# Patient Record
Sex: Female | Born: 1955 | Race: Black or African American | Hispanic: No | Marital: Married | Smoking: Never smoker
Health system: Southern US, Community
[De-identification: ages and names within clinical notes are randomized; demographics above are authoritative.]

## PROBLEM LIST (undated history)

## (undated) ENCOUNTER — Ambulatory Visit (INDEPENDENT_AMBULATORY_CARE_PROVIDER_SITE_OTHER): Admission: RE | Payer: Self-pay

## (undated) DIAGNOSIS — I10 Essential (primary) hypertension: Secondary | ICD-10-CM

## (undated) DIAGNOSIS — D332 Benign neoplasm of brain, unspecified: Secondary | ICD-10-CM

## (undated) DIAGNOSIS — E7800 Pure hypercholesterolemia, unspecified: Secondary | ICD-10-CM

## (undated) HISTORY — PX: OTHER SURGICAL HISTORY: SHX169

## (undated) HISTORY — PX: EYE SURGERY: SHX253

## (undated) HISTORY — PX: BREAST CYST EXCISION: SHX579

## (undated) HISTORY — PX: EXCHANGE, CORNEAL IMPLANT: SHX3930

## (undated) HISTORY — PX: TUBAL LIGATION: SHX77

## (undated) HISTORY — PX: BREAST BIOPSY: SHX20

## (undated) HISTORY — PX: BREAST EXCISIONAL BIOPSY: SUR124

## (undated) HISTORY — PX: BRAIN SURGERY: SHX531

---

## 1995-03-17 ENCOUNTER — Ambulatory Visit: Admit: 1995-03-17 | Disposition: A | Discharge status: Home / Self Care | Payer: Self-pay | Admitting: Family Medicine

## 1995-08-16 ENCOUNTER — Ambulatory Visit (HOSPITAL_BASED_OUTPATIENT_CLINIC_OR_DEPARTMENT_OTHER): Admission: RE | Admit: 1995-08-16 | Payer: Self-pay | Source: Ambulatory Visit

## 1997-09-28 ENCOUNTER — Emergency Department
Admit: 1997-09-28 | Disposition: A | Discharge status: Home / Self Care | Payer: Self-pay | Admitting: Emergency Medicine

## 1998-03-17 ENCOUNTER — Ambulatory Visit: Admit: 1998-03-17 | Disposition: A | Discharge status: Home / Self Care | Payer: Self-pay | Admitting: Internal Medicine

## 2001-01-22 ENCOUNTER — Emergency Department: Admit: 2001-01-22 | Payer: Self-pay | Source: Emergency Department | Admitting: Emergency Medicine

## 2002-06-24 ENCOUNTER — Ambulatory Visit: Admit: 2002-06-24 | Disposition: A | Discharge status: Home / Self Care | Payer: Self-pay | Source: Ambulatory Visit

## 2002-09-13 ENCOUNTER — Emergency Department: Admit: 2002-09-13 | Payer: Self-pay | Source: Emergency Department

## 2002-09-23 ENCOUNTER — Ambulatory Visit: Admit: 2002-09-23 | Disposition: A | Discharge status: Home / Self Care | Payer: Self-pay | Source: Ambulatory Visit

## 2002-10-27 ENCOUNTER — Emergency Department: Admit: 2002-10-27 | Payer: Self-pay | Source: Emergency Department | Admitting: Emergency Medicine

## 2003-06-07 ENCOUNTER — Emergency Department: Admit: 2003-06-07 | Payer: Self-pay | Source: Emergency Department | Admitting: Emergency Medicine

## 2005-05-03 ENCOUNTER — Ambulatory Visit
Admission: AD | Admit: 2005-05-03 | Disposition: A | Discharge status: Home / Self Care | Payer: Self-pay | Source: Ambulatory Visit | Admitting: Primary Podiatric Medicine

## 2006-04-24 HISTORY — PX: BREAST BIOPSY: SHX20

## 2007-02-20 ENCOUNTER — Ambulatory Visit
Admission: RE | Admit: 2007-02-20 | Disposition: A | Discharge status: Home / Self Care | Payer: Self-pay | Source: Ambulatory Visit

## 2007-04-25 HISTORY — PX: BRAIN SURGERY: SHX531

## 2011-04-25 HISTORY — PX: CORNEAL TRANSPLANT: SHX108

## 2012-04-10 ENCOUNTER — Emergency Department
Admission: EM | Admit: 2012-04-10 | Discharge: 2012-04-10 | Disposition: A | Discharge status: Home / Self Care | Payer: Self-pay | Attending: Emergency Medicine | Admitting: Emergency Medicine

## 2012-04-10 ENCOUNTER — Emergency Department: Payer: Self-pay

## 2012-04-10 DIAGNOSIS — N390 Urinary tract infection, site not specified: Secondary | ICD-10-CM | POA: Insufficient documentation

## 2012-04-10 DIAGNOSIS — Z880 Allergy status to penicillin: Secondary | ICD-10-CM | POA: Insufficient documentation

## 2012-04-10 DIAGNOSIS — I10 Essential (primary) hypertension: Secondary | ICD-10-CM | POA: Insufficient documentation

## 2012-04-10 DIAGNOSIS — E7800 Pure hypercholesterolemia, unspecified: Secondary | ICD-10-CM | POA: Insufficient documentation

## 2012-04-10 HISTORY — DX: Pure hypercholesterolemia, unspecified: E78.00

## 2012-04-10 HISTORY — DX: Essential (primary) hypertension: I10

## 2012-04-10 LAB — URINALYSIS WITH MICROSCOPIC
Bilirubin, UA: NEGATIVE
Glucose, UA: NEGATIVE
Ketones UA: NEGATIVE
Nitrite, UA: NEGATIVE
Protein, UR: NEGATIVE
Specific Gravity UA: 1.006 (ref 1.001–1.035)
Urine pH: 7 (ref 5.0–8.0)
Urobilinogen, UA: NORMAL mg/dL

## 2012-04-10 MED ORDER — NITROFURANTOIN MONOHYD MACRO 100 MG PO CAPS
100.00 mg | ORAL_CAPSULE | Freq: Once | ORAL | Status: AC
Start: 2012-04-10 — End: 2012-04-10
  Administered 2012-04-10: 100 mg via ORAL
  Filled 2012-04-10: qty 1

## 2012-04-10 MED ORDER — NITROFURANTOIN MONOHYD MACRO 100 MG PO CAPS
100.00 mg | ORAL_CAPSULE | Freq: Two times a day (BID) | ORAL | Status: AC
Start: 2012-04-10 — End: 2012-04-17

## 2012-04-10 MED ORDER — PHENAZOPYRIDINE HCL 200 MG PO TABS
200.00 mg | ORAL_TABLET | Freq: Once | ORAL | Status: AC
Start: 2012-04-10 — End: 2012-04-10
  Administered 2012-04-10: 200 mg via ORAL
  Filled 2012-04-10: qty 1

## 2012-04-10 NOTE — ED Notes (Signed)
E-sign not working, pt signed paper copy of D/C instructions.

## 2012-04-10 NOTE — Discharge Instructions (Signed)
Urinary Tract Infection    You have been diagnosed with a lower urinary tract infection (UTI). This is also called cystitis.    Cystitis is an infection in your bladder. Your doctor diagnosed it by testing your urine. Cystitis usually causes burning with urination or frequent urination. It might make you feel like you have to urinate even when you don't.     Cystitis is usually treated with antibiotics and medicine to help with pain.    It is VERY IMPORTANT that you fill your prescription and take all of the antibiotics as directed. If a lower urinary tract infection goes untreated for too long, it can become a kidney infection.    FOR WOMEN: To reduce the risk of getting cystitis again:   Always urinate before and after sexual intercourse.   Always wipe from front to back after urinating or having a bowel movement. Do not wipe from back to front.   Drink plenty of fluids. Try to drink cranberry or blueberry juice. These juices have a chemical that stops bacteria from "sticking" to the bladder.    YOU SHOULD SEEK MEDICAL ATTENTION IMMEDIATELY, EITHER HERE OR AT THE NEAREST EMERGENCY DEPARTMENT, IF ANY OF THE FOLLOWING OCCURS:   You have a fever or shaking chills.   You feel nauseated or vomit.   You have pain in your side or back.   You don't get better after taking all of your antibiotics.   You have any new symptoms or concerns.   You feel worse or do not improve.    Hypertension    You have been diagnosed with elevated blood pressure.    The medical term for high blood pressure is hypertension. Many people feel anxious or uncomfortable about being at the hospital. If you feel anxious today, this could make your blood pressure appear high, even if your blood pressure is usually normal. Check your blood pressure several more times when you are not feeling stress. Keep a record of these readings and give this information to your regular doctor. He or she will decide whether you have hypertension  that requires medical treatment.    If your blood pressure becomes extremely high all of a sudden, you will probably notice symptoms. In fact, very high blood pressure is a medical emergency. Most people with hypertension have blood pressure that is only a little too high. Mild high blood pressure does not cause specific symptoms. Instead, the effects of hypertension develop slowly over time. Untreated hypertension can affect the heart, brain, kidneys, eyes, and blood vessels. Unfortunately, by the time side-effects become noticeable, the body has already been damaged. This is why hypertension is called "the silent killer!"    It is important to follow up with your regular doctor. Check your blood pressure several times in the next 1 to 2 weeks and tell your doctor about the results. It may be helpful to keep a log or a journal where you can write down your blood pressures. Note the time of day and the activity you were doing when the reading was taken.    YOU SHOULD SEEK MEDICAL ATTENTION IMMEDIATELY, EITHER HERE OR AT THE NEAREST EMERGENCY DEPARTMENT, IF ANY OF THE FOLLOWING OCCURS:   You have a headache.   You have chest pain.   You are short of breath or have trouble breathing.    You feel weak, especially on only one side of the body.   Your symptoms get worse or you have other concerns.  concerns.

## 2012-04-10 NOTE — ED Notes (Signed)
Woke up this evening with severe left lower back pain and urinary urgency. +frequency, denies burning or other complaints. +N

## 2012-04-10 NOTE — ED Provider Notes (Signed)
Physician/Midlevel provider first contact with patient: 04/10/12 0324         EMERGENCY DEPARTMENT HISTORY AND PHYSICAL EXAM    Date Time: 04/10/2012 4:37 AM  Patient Name: Wendy [name]  Attending MD Etheleen Mayhew               History of Presenting Illness     Chief Complaint:   Chief Complaint   Patient presents with   . Flank Pain       Wendy [name] is a 56 y.o. female, with PMHx: HTN, high cholesterol, who presents with left sided lower back pain onset this morning just PTA. A/w urinary frequency, and mild nausea. Patient states symptoms are similar to previous UTI. No vomiting, fever, or other GI or GU complaints at this time. No other symptoms or complaints.   This history was obtained from the patient. They worsen with movement and improve with rest.     Past Medical History     Past Medical History   Diagnosis Date   . Hypertensive disorder    . Hypercholesteremia        Past Surgical History     Past Surgical History   Procedure Date   . Brain surgery 2009   . Corneal transplant        Family History     No family history on file.    Social History     History     Social History   . Marital Status: Legally Separated     Spouse Name: N/A     Number of Children: N/A   . Years of Education: N/A     Social History Main Topics   . Smoking status: Never Smoker    . Smokeless tobacco: Not on file   . Alcohol Use: No   . Drug Use: No   . Sexually Active: Not on file     Other Topics Concern   . Not on file     Social History Narrative   . No narrative on file       Allergies     Allergies   Allergen Reactions   . Iodine    . Penicillins        Medications     Current facility-administered medications:[COMPLETED] nitrofurantoin (macrocrystal-monohydrate) (MACROBID) capsule 100 mg, 100 mg, Oral, Once, [name], Mont Dutton, MD, 100 mg at 04/10/12 0420;  phenazopyridine (PYRIDIUM) tablet 200 mg, 200 mg, Oral, Once, [name], Mont Dutton, MD  Current outpatient prescriptions:lisinopril-hydrochlorothiazide  (PRINZIDE,ZESTORETIC) 10-12.5 MG per tablet, Take 1 tablet by mouth daily., Disp: , Rfl: ;  rosuvastatin (CRESTOR) 20 MG tablet, Take 20 mg by mouth daily., Disp: , Rfl: ;  nitrofurantoin, macrocrystal-monohydrate, (MACROBID) 100 MG capsule, Take 1 capsule (100 mg total) by mouth 2 (two) times daily., Disp: 10 capsule, Rfl: 0    Review of Systems     Positive: left lower back pain, urinary frequency, nausea.     All Other Systems Reviewed and Negative: Yes    Physical Exam     Constitutional: Vital signs reviewed. Well appearing.  Head: Normocephalic, atraumatic  Eyes: No conjunctival injection. No discharge.    Neck: Normal range of motion.    Abdomen: Soft and min suprapub tender to palp. No guarding. No masses or hepatosplenomegaly.  Back no cva ttp  UpperExtremity:no edema, cyanosis, FROM  LowerExtremity: No edema. No cyanosis. FROM  Neurological: No focal motor deficits by observation. Speech normal. Memory normal.  Skin: Warm and dry. No  rash.  Lymphatic:  Psychiatric: Normal affect. Normal concentration.      Diagnostic Study Results     Labs -     Results     Procedure Component Value Units Date/Time    LAB-UA with Micro [[id number]]  (Abnormal) Collected:04/10/12 0325    Specimen Information:Urine Updated:04/10/12 0414     Urine Type Clean Catch      Color, UA Yellow      Clarity, UA Clear      Specific Gravity UA 1.006      Urine pH 7.0      Leukocytes, UA Moderate (A)      Nitrite, UA Negative      Protein, UA Negative      Glucose, UA Negative      Ketones UA Negative      Urobilinogen, UA Normal mg/dL      Bilirubin, UA Negative      Blood, UA Moderate (A)      RBC, UA 26 - 50 (A) /HPF      WBC, UA 26 - 50 (A) /HPF      Squamous Epithelial Cells, Urine 0 - 5 /HPF      Urine Bacteria Rare /HPF           Radiologic Studies -   Radiology Results (24 Hour)     ** No Results found for the last 24 hours. **      .    Clinical Course in the Emergency Department            Medical Decision Making     I reviewed  the vital signs, nursing notes, past medical history, past surgical history, family history and social history.    Vital Signs -   Patient Vitals for the past 12 hrs:   BP Temp Pulse Resp   04/10/12 0408 162/74 mmHg - 71  16    04/10/12 0314 198/98 mmHg 97.5 F (36.4 C) 83  16        Pulse Oximetry Analysis - nl without need for supplemental oxygen      Labs:I have reviewed the labs at the time of visit. Dr Valere Dross      Differential Diagnosis (not completely inclusive): uti, pyelo,s tone    cw uti, nontoxic, stable for [place] with po meds            Final diagnoses:   UTI (lower urinary tract infection)     New Prescriptions    NITROFURANTOIN, MACROCRYSTAL-MONOHYDRATE, (MACROBID) 100 MG CAPSULE    Take 1 capsule (100 mg total) by mouth 2 (two) times daily.     ED Disposition     Discharge Wendy [name] discharge to home/self care.    Condition at discharge: Stable            _______________________________    Attestations:    I was acting as a scribe for Carmon Sails, MD on HENDERSON,Reatha R  Treatment Team: Scribe: Azalia Bilis   I am the first provider for this patient and I personally performed the services documented. Treatment Team: Scribe: Charlyn Minerva E is scribing for me on HENDERSON,Jennel R. This note accurately reflects work and decisions made by me.  Carmon Sails, MD  _______________________________              Carmon Sails, MD  04/11/12 [phone]

## 2012-04-29 ENCOUNTER — Ambulatory Visit (INDEPENDENT_AMBULATORY_CARE_PROVIDER_SITE_OTHER): Payer: Self-pay

## 2012-05-20 ENCOUNTER — Emergency Department
Admission: EM | Admit: 2012-05-20 | Discharge: 2012-05-20 | Disposition: A | Discharge status: Home / Self Care | Payer: Self-pay

## 2012-05-20 ENCOUNTER — Emergency Department: Payer: Self-pay

## 2012-05-20 DIAGNOSIS — E7800 Pure hypercholesterolemia, unspecified: Secondary | ICD-10-CM | POA: Insufficient documentation

## 2012-05-20 DIAGNOSIS — S8000XA Contusion of unspecified knee, initial encounter: Secondary | ICD-10-CM | POA: Insufficient documentation

## 2012-05-20 DIAGNOSIS — I10 Essential (primary) hypertension: Secondary | ICD-10-CM | POA: Insufficient documentation

## 2012-05-20 DIAGNOSIS — S139XXA Sprain of joints and ligaments of unspecified parts of neck, initial encounter: Secondary | ICD-10-CM | POA: Insufficient documentation

## 2012-05-20 DIAGNOSIS — Z880 Allergy status to penicillin: Secondary | ICD-10-CM | POA: Insufficient documentation

## 2012-05-20 MED ORDER — CYCLOBENZAPRINE HCL 10 MG PO TABS
10.0000 mg | ORAL_TABLET | Freq: Three times a day (TID) | ORAL | Status: AC | PRN
Start: 2012-05-20 — End: 2012-06-04

## 2012-05-20 MED ORDER — HYDROCODONE-ACETAMINOPHEN 5-325 MG PO TABS
1.0000 | ORAL_TABLET | Freq: Four times a day (QID) | ORAL | Status: AC | PRN
Start: 2012-05-20 — End: 2012-05-30

## 2012-05-20 MED ORDER — HYDROCODONE-ACETAMINOPHEN 5-325 MG PO TABS
1.0000 | ORAL_TABLET | Freq: Once | ORAL | Status: DC
Start: 2012-05-20 — End: 2012-05-20
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
## 2012-05-20 NOTE — ED Provider Notes (Signed)
Physician/Midlevel provider first contact with patient: 05/20/12 1741         History     Chief Complaint   Patient presents with   . Shoulder Pain   . Neck Pain     HPI    Past Medical History   Diagnosis Date   . Hypertensive disorder    . Hypercholesteremia        Past Surgical History   Procedure Date   . Brain surgery 2009   . Corneal transplant        History reviewed. No pertinent family history.    Social  History   Substance Use Topics   . Smoking status: Never Smoker    . Smokeless tobacco: Not on file   . Alcohol Use: No       .     Allergies   Allergen Reactions   . Iodine    . Penicillins        Current/Home Medications    LISINOPRIL-HYDROCHLOROTHIAZIDE (PRINZIDE,ZESTORETIC) 10-12.5 MG PER TABLET    Take 1 tablet by mouth daily.    ROSUVASTATIN (CRESTOR) 20 MG TABLET    Take 20 mg by mouth daily.        Review of Systems    Physical Exam    BP 156/94  Pulse 87  Temp 98.2 F (36.8 C) (Tympanic)  Ht 1.626 m  Wt 77.111 kg  BMI 29.17 kg/m2  SpO2 98%    Physical Exam   Nursing note and vitals reviewed.  Constitutional: She is oriented to person, place, and time. She appears well-developed and well-nourished.   HENT:   Right Ear: External ear normal.   Left Ear: External ear normal.   Eyes: EOM are normal. Pupils are equal, round, and reactive to light.   Neck: Normal range of motion.        Lt lat para cervical tenderness and pain on rom   Musculoskeletal: She exhibits tenderness. She exhibits no edema.        Lt sided neck tenderness/ also sl tender lt knee/ full rom   Neurological: She is alert and oriented to person, place, and time. She displays normal reflexes. No cranial nerve deficit. Coordination normal.   Skin: Skin is warm and dry.       MDM and ED Course     ED Medication Orders     None           MDM      Procedures    Clinical Impression & Disposition     Clinical Impression  Final diagnoses:   None        ED Disposition     None           New Prescriptions    No medications on file         Treatment Team: Scribe: Keane Police, MD  05/20/12 [phone]

## 2012-12-03 ENCOUNTER — Other Ambulatory Visit: Payer: Self-pay | Admitting: Family Medicine

## 2012-12-06 ENCOUNTER — Ambulatory Visit
Admission: RE | Admit: 2012-12-06 | Discharge: 2012-12-06 | Disposition: A | Discharge status: Home / Self Care | Payer: No Typology Code available for payment source | Source: Ambulatory Visit | Attending: Family Medicine | Admitting: Family Medicine

## 2012-12-06 DIAGNOSIS — Z0000 Encounter for general adult medical examination without abnormal findings: Secondary | ICD-10-CM | POA: Insufficient documentation

## 2013-01-01 ENCOUNTER — Other Ambulatory Visit: Payer: Self-pay | Admitting: Family

## 2013-01-09 ENCOUNTER — Ambulatory Visit: Payer: Charity

## 2013-01-11 ENCOUNTER — Ambulatory Visit
Admission: RE | Admit: 2013-01-11 | Discharge: 2013-01-11 | Disposition: A | Discharge status: Home / Self Care | Payer: No Typology Code available for payment source | Source: Ambulatory Visit | Attending: Family | Admitting: Family

## 2013-01-11 DIAGNOSIS — D320 Benign neoplasm of cerebral meninges: Secondary | ICD-10-CM | POA: Insufficient documentation

## 2013-01-11 DIAGNOSIS — I6789 Other cerebrovascular disease: Secondary | ICD-10-CM | POA: Insufficient documentation

## 2013-01-27 ENCOUNTER — Ambulatory Visit: Payer: No Typology Code available for payment source | Attending: Ophthalmology | Admitting: Ophthalmology

## 2013-01-27 NOTE — Progress Notes (Signed)
Agree with assessment and plan as above.

## 2013-01-27 NOTE — Progress Notes (Signed)
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 DSEK OS  -appears clear without signs of rejection  -recommend taper durezol to daily OS instead of BID   -recommend aggressive preservative free artificial tears 1-2 hours OS     2. Fuchs OD  -patient would like to hold off on any surgery at this point   -Follow up in 4 months, if no symptomatic improvement, can consider muro     3. Dry eye syndrome  -aggressive lubrication OU per #1  -use of goggles to prevent further drying  -ointment qhs OU     4. Pseudophakia OS   -stable, well-centered    5. Nuclear sclerosis OD  -not visually significant   -continue to monitor

## 2013-02-10 ENCOUNTER — Telehealth: Payer: Self-pay

## 2013-02-10 NOTE — Telephone Encounter (Signed)
Pt called to note irritation OS still had not improved since starting ung per note. Pt was encouraged to continue with ung and durezol QD OS per notes. Pt also encouraged to use preservative free artificial tears q1-2hrs (per Drs Patel/Ali notes). Pt notes she has only be using BID and will be increase application schedule. Pt encouraged to adhere strictly and to call back with any symptoms of pain or increased irritation or changes in vision. Pt scheduled for f/u Feb 2015, stated understanding that she can call back for an earlier appointment if needed.

## 2013-02-18 ENCOUNTER — Telehealth: Payer: Self-pay | Admitting: Ophthalmology

## 2013-02-18 NOTE — Telephone Encounter (Signed)
Unable to reach patient. This was a call back, patient had called back regarding ongoing dry eye complaint. Will try to call back soon, left message.

## 2013-02-19 ENCOUNTER — Telehealth: Payer: Self-pay | Admitting: Ophthalmology

## 2013-02-19 NOTE — Telephone Encounter (Signed)
No answer again, left voicemail for patient to call back and given time for [place] to call. Told to go to ER/walk in to clinic if have concern for infection/worsening vision. Patient has follow-up with [place] within 4 months.

## 2013-02-26 ENCOUNTER — Ambulatory Visit: Payer: No Typology Code available for payment source | Attending: Ophthalmology | Admitting: Ophthalmology

## 2013-02-26 NOTE — Progress Notes (Signed)
Agree with assessment and plan as above.

## 2013-02-26 NOTE — Progress Notes (Signed)
1. DSEK OS  -appears clear without signs of rejection  -continue durezol qday OS    -continue aggressive preservative free artificial tears 1-2 hours OS, ointment qhs or bid    2. Fuchs OD  -again patient would like to hold off on any surgery at this point    -continue muro qid od  -Follow up in 3 months    3. Dry eye syndrome OU  -aggressive lubrication OU per #1  -will treat for blepharitis as well given RUL swelling (which has been present since surgery and stable per pt) - handout given, WC bid with lid hygeine    4. Pseudophakia OS    -stable, well-centered    5. Nuclear sclerosis OD  -not visually significant    -continue to monitor     6. Refractive error ou - pt wants new glasses, but would hold off until symptoms of (3) have improvement    Follow up - 3 m or sooner with any sign/sx of graft rejection as discussed with the patient.

## 2013-04-03 ENCOUNTER — Emergency Department: Payer: No Typology Code available for payment source

## 2013-04-03 ENCOUNTER — Emergency Department
Admission: EM | Admit: 2013-04-03 | Discharge: 2013-04-03 | Disposition: A | Discharge status: Home / Self Care | Payer: No Typology Code available for payment source | Attending: Emergency Medicine | Admitting: Emergency Medicine

## 2013-04-03 DIAGNOSIS — R079 Chest pain, unspecified: Secondary | ICD-10-CM | POA: Insufficient documentation

## 2013-04-03 DIAGNOSIS — E7800 Pure hypercholesterolemia, unspecified: Secondary | ICD-10-CM | POA: Insufficient documentation

## 2013-04-03 DIAGNOSIS — I10 Essential (primary) hypertension: Secondary | ICD-10-CM | POA: Insufficient documentation

## 2013-04-03 LAB — CBC AND DIFFERENTIAL
Basophils Absolute Automated: 0.02 (ref 0.00–0.20)
Basophils Automated: 0 %
Eosinophils Absolute Automated: 0.1 (ref 0.00–0.70)
Eosinophils Automated: 2 %
Hematocrit: 40.4 % (ref 37.0–47.0)
Hgb: 13 g/dL (ref 12.0–16.0)
Immature Granulocytes Absolute: 0.01
Immature Granulocytes: 0 %
Lymphocytes Absolute Automated: 1.15 (ref 0.50–4.40)
Lymphocytes Automated: 17 %
MCH: 29 pg (ref 28.0–32.0)
MCHC: 32.2 g/dL (ref 32.0–36.0)
MCV: 90 fL (ref 80.0–100.0)
MPV: 10.7 fL (ref 9.4–12.3)
Monocytes Absolute Automated: 0.8 (ref 0.00–1.20)
Monocytes: 12 %
Neutrophils Absolute: 4.77 (ref 1.80–8.10)
Neutrophils: 70 %
Nucleated RBC: 0 (ref 0–1)
Platelets: 396 (ref 140–400)
RBC: 4.49 (ref 4.20–5.40)
RDW: 14 % (ref 12–15)
WBC: 6.85 (ref 3.50–10.80)

## 2013-04-03 LAB — ECG 12-LEAD
Atrial Rate: 76 {beats}/min
P Axis: 72 degrees
P-R Interval: 156 ms
Q-T Interval: 404 ms
QRS Duration: 86 ms
QTC Calculation (Bezet): 454 ms
R Axis: 20 degrees
T Axis: 49 degrees
Ventricular Rate: 76 {beats}/min

## 2013-04-03 LAB — GFR: EGFR: >60

## 2013-04-03 LAB — URINALYSIS WITH MICROSCOPIC
Bilirubin, UA: NEGATIVE
Blood, UA: NEGATIVE
Glucose, UA: NEGATIVE
Ketones UA: NEGATIVE
Leukocyte Esterase, UA: NEGATIVE
Nitrite, UA: NEGATIVE
Protein, UR: NEGATIVE
Specific Gravity UA: 1.01 (ref 1.001–1.035)
Urine pH: 7.5 (ref 5.0–8.0)
Urobilinogen, UA: NORMAL mg/dL

## 2013-04-03 LAB — CK: Creatine Kinase (CK): 270 U/L — ABNORMAL HIGH (ref 29–168)

## 2013-04-03 LAB — BASIC METABOLIC PANEL
BUN: 12 mg/dL (ref 7.0–19.0)
CO2: 25 mEq/L (ref 22–29)
Calcium: 10 mg/dL (ref 8.5–10.5)
Chloride: 102 mEq/L (ref 98–107)
Creatinine: 0.7 mg/dL (ref 0.6–1.0)
Glucose: 90 mg/dL (ref 70–100)
Potassium: 4.1 mEq/L (ref 3.5–5.1)
Sodium: 138 mEq/L (ref 136–145)

## 2013-04-03 LAB — POCT PREGNANCY TEST, URINE HCG: POCT Pregnancy HCG Test, UR: NEGATIVE

## 2013-04-03 LAB — I-STAT TROPONIN: i-STAT Troponin: 0 ng/mL (ref 0.00–0.09)

## 2013-04-03 LAB — CKMB: Creatinine Kinase MB (CKMB): 2.4 ng/mL (ref 0.0–4.9)

## 2013-04-03 MED ORDER — IBUPROFEN 400 MG PO TABS
800.0000 mg | ORAL_TABLET | Freq: Once | ORAL | Status: AC
Start: 2013-04-03 — End: 2013-04-03
  Administered 2013-04-03: 800 mg via ORAL
  Filled 2013-04-03: qty 2

## 2013-04-03 MED ORDER — IBUPROFEN 800 MG PO TABS
800.0000 mg | ORAL_TABLET | Freq: Three times a day (TID) | ORAL | 0 refills | Status: DC | PRN
Start: 2013-04-03 — End: 2014-09-15
  Filled 2013-04-03: qty 30, 10d supply, fill #0

## 2013-04-03 NOTE — ED Provider Notes (Signed)
Attending Note:     The patient was seen and examined by the resident and myself. I agree with the plan as it was presented to me.   I assessed this patient at 8:03 AM. I reviewed the vital signs, nursing notes, past medical history, past surgical history, family history and social history. I am the first attending provider for this patient.   - Loris Seelye Josem Kaufmann, MD    Hx: 57 y.o. female with a PMH of HLD, HTN, and prediabetes p/w left sided, sharp chest pain that is worse with lying down, and taking a deep breath.  The pain woke her from sleep around 0200, resolved, and then at 0600, the pain returned.  States that she has never had this pain before.  Admits to lifting weights yesterday but does not remember any specific injury.  The pain occasionally radiates to the left shoulder.  No associated nausea, no vomiting, no cough, no shortness of breath.  The pain is still present at time of evaluation.  Last stress test was 12-18 months ago - negative.    No significant family cardiac history.    PE:  Constitutional: Vital signs reviewed.   Head: Normocephalic, atraumatic  Eyes: No conjunctival injection. No discharge.  ENT: Mucous membranes moist  Neck: Normal range of motion. Non-tender.  Respiratory/Chest: Clear to auscultation. No respiratory distress.   Cardiovascular: Regular rate and rhythm. No murmur.   Abdomen: Soft and non-tender. No guarding. No masses or hepatosplenomegaly.  Back: Nrml appearance, no flank ttp, no midline ttp or masses.   LowerExtremity: No edema. No cyanosis.  Neurological: No focal motor deficits by observation. Speech normal. Memory normal.  Msk: Nrml ROM, non-ttp  Skin: Warm and dry. No rash.  Lymphatic: No cervical lymphadenopathy.  Psychiatric: Normal affect. Normal concentration.    Labs:     Results     Procedure Component Value Units Date/Time    CK-MB [[id number]] Collected:04/03/13 0846     Creatinine Kinase MB (CKMB) 2.4 ng/mL Updated:04/03/13 1152    LAB-UA with Micro [[id number]]  Collected:04/03/13 0847    Specimen Information:Urine Updated:04/03/13 1146     Urine Type Clean Catch      Color, UA Yellow      Clarity, UA Clear      Specific Gravity UA 1.010      Urine pH 7.5      Leukocyte Esterase, UA Negative      Nitrite, UA Negative      Protein, UR Negative      Glucose, UA Negative      Ketones UA Negative      Urobilinogen, UA Normal mg/dL      Bilirubin, UA Negative      Blood, UA Negative      RBC, UA 0 - 5      WBC, UA 0 - 5      Squamous Epithelial Cells, Urine 0 - 5     GFR [[id number]] Collected:04/03/13 0846     EGFR >60.0 Updated:04/03/13 1115    CK with MB if Indicated [[id number]]  (Abnormal) Collected:04/03/13 0846    Specimen Information:Blood Updated:04/03/13 1115     Creatine Kinase (CK) 270 (H) U/L     Basic Metabolic Panel (BMP) [[id number]] Collected:04/03/13 0846    Specimen Information:Blood Updated:04/03/13 1115     Glucose 90 mg/dL      BUN 13.2 mg/dL      Creatinine 0.7 mg/dL      CALCIUM 44.0 mg/dL  Sodium 138 mEq/L      Potassium 4.1 mEq/L      Chloride 102 mEq/L      CO2 25 mEq/L     CBC with Differential [[id number]] Collected:04/03/13 0846    Specimen Information:Blood / Blood Updated:04/03/13 1056     WBC 6.85      RBC 4.49      Hgb 13.0 g/dL      Hematocrit 78.2 %      MCV 90.0 fL      MCH 29.0 pg      MCHC 32.2 g/dL      RDW 14 %      Platelets 396      MPV 10.7 fL      Neutrophils 70 %      Lymphocytes Automated 17 %      Monocytes 12 %      Eosinophils Automated 2 %      Basophils Automated 0 %      Immature Granulocyte 0 %      Nucleated RBC 0      Neutrophils Absolute 4.77      Abs Lymph Automated 1.15      Abs Mono Automated 0.80      Abs Eos Automated 0.10      Absolute Baso Automated 0.02      Absolute Immature Granulocyte 0.01     POCT Pregnancy Test, Urine HCG [[id number]] Collected:04/03/13 0905     POCT QC Pass Updated:04/03/13 0907     POCT Pregnancy HCG Test, UR Negative      Comment:        Result:     Negative Value is Normal in Healthy Males or  Healthy non-pregnant Females    i-Stat Troponin [[id number]] Collected:04/03/13 0852     i-STAT Troponin 0.00 ng/mL Updated:04/03/13 0904          Rads:     XR CHEST 2 VIEWS    Final Result:      1. No acute cardiopulmonary process.        Launa Flight, MD     04/03/2013 8:40 AM       Medical Decision Making:   I reviewed the vital signs, nursing notes, past medical history, past surgical history, family history and social history.  Vital Signs - No data found.    Clinical course and plan:   Atypical chest pain with unremarkable EKG. No pain at this time. Labs pending, will re-eval.    Re-evaluation: 11:52A: Based on the patient's clinical presentation, vital signs, and diagnostic studies, I feel the patient is safe for discharge. I considered (in part and not exclusively): Acute Coronary Syndrome, Pulmonary Embolism, Pnumonia, Pneumothorax, Aortic Dissection, Aortic Aneurysm and Abdominal etiologies. The patient/family understands their instructions and I am comfortable that the patient will be able to follow up as an outpatient in a timely fashion.    Clinical impression:   1. Chest pain      Disposition:   ED Disposition     Discharge Leone Brand discharge to home/self care.    Condition at disposition: Stable          Cardiac Rhythm strip interpretation   Interpreted by: Justice Aguirre H. Joseph Art, MD at 8:03 AM on 04/08/2013  SR 81    EKG Interpretation  Interpreted by: Andrik Sandt H. Joseph Art, MD at 8:03 AM on 04/08/2013 - normal sinus rhythm at 76 bpm.  Normal axis.  Normal intervals.  No ischemia.  Impression: normal EKG.  Amount and/or Complexity of Data Reviewed   Nursing notes reviewed:Yes   Pulse Oximetry Analysis - Normal   Lab tests: ordered and reviewed   Radiology tests: ordered and reviewed   Medicine tests: ordered and reviewed   Independent visualization of images, tracings, or specimens: yes   Decide to obtain previous medical records: yes   Review and summarize past medical records: yes    Risk of  Complications, Morbidity, and/or Mortality   Presenting problems: Moderate   Diagnostic procedures: Moderate   Management options: Low    Critical Care time: None    Patient Progress: Improved    Procedures: None    Final Diagnosis:     Final diagnoses:  Final diagnoses:   Chest pain       ED Disposition:   ED Disposition     Discharge Leone Brand discharge to home/self care.    Condition at disposition: Stable              Medications   atorvastatin (LIPITOR) 20 MG tablet (not administered)   ibuprofen (ADVIL,MOTRIN) 800 MG tablet (not administered)   ibuprofen (ADVIL,MOTRIN) tablet 800 mg (800 mg Oral Given 04/03/13 0855)     _______________________________      Signed by: Jeralyn Ruths. Joseph Art, MD    Attestations:  I was acting as a Neurosurgeon for Lenord Fellers, MD on [employer] [place]    I am the first provider for this patient and I personally performed the services documented. Maisie Fus del [place] is scribing for me on HENDERSON,Alveda R. This note accurately reflects work and decisions made by me.  Lenord Fellers, MD      Lenord Fellers, MD  04/08/13 [phone]

## 2013-04-03 NOTE — ED Notes (Signed)
Woke up at 0130 with pain under breast L side, radiating to shoulder sharp. Slight diaphoresis, mouth watering, but denies N&V, denies SOB. "Breathing harder than usual." Couldn't tie shoes due to pain.

## 2013-04-03 NOTE — ED Provider Notes (Signed)
Physician/Midlevel provider first contact with patient: 04/03/13 0801         History     Chief Complaint   Patient presents with   . Chest Pain     57y/o female with HTN, HLD, preDM, presents with acute onset left sided chest pain, sharp, constant, worse lying down and moderatley pleuritic. She has never had this pain before. She had a cardiac exercise stress last year that was normal - she is unsure of the indication for that testing. She states that yesterday she was lifitng 10lb weights, but that is not new and she does not think that she strained anything. The pain will occasionally radiate to her left shoulder. She denies n/v, cough, shortness of breath.    LMP Jan 2014          Past Medical History   Diagnosis Date   . Hypertensive disorder    . Hypercholesteremia        Past Surgical History   Procedure Date   . Brain surgery 2009   . Corneal transplant    . Breast biopsy 2008     right breast biopsy   . Breast cyst excision 1970s     right breast tumor removed 40+ years ago - benign       Family History   Problem Relation Age of Onset   . Breast cancer Neg Hx        Social  History   Substance Use Topics   . Smoking status: Never Smoker    . Smokeless tobacco: Not on file   . Alcohol Use: No       .     Allergies   Allergen Reactions   . Crestor (Rosuvastatin)    . Iodine    . Penicillins    . Tetanus Immune Globulin        Current/Home Medications    ATORVASTATIN (LIPITOR) 20 MG TABLET    Take 20 mg by mouth daily.    DIFLUPREDNATE (DUREZOL) 0.05 % EMULSION OPHTHALMIC SOLUTION    Place 1 drop into the left eye daily.     LISINOPRIL-HYDROCHLOROTHIAZIDE (PRINZIDE,ZESTORETIC) 10-12.5 MG PER TABLET    Take 1 tablet by mouth daily.    NEPAFENAC (ILEVRO) 0.3 % SUSPENSION    Place 1 drop into the left eye daily.    TROPICAMIDE (MYDRIACYL) 1 % OPHTHALMIC SOLUTION    Place 1 drop into the left eye daily as needed.        Review of Systems   Constitutional: Negative for fever.   Respiratory: Negative for cough,  chest tightness and shortness of breath.    Cardiovascular: Positive for chest pain. Negative for palpitations and leg swelling.   Gastrointestinal: Negative for nausea, vomiting and abdominal pain.   Musculoskeletal: Positive for back pain.   Skin: Positive for rash.   All other systems reviewed and are negative.        Physical Exam    BP 175/95  Pulse 84  Temp 97.6 F (36.4 C)  Resp 18  Ht 1.626 m  Wt 80.74 kg  BMI 30.54 kg/m2  SpO2 98%    Physical Exam   Constitutional: She is oriented to person, place, and time. She appears well-nourished. No distress.   HENT:   Mouth/Throat: Oropharynx is clear and moist. No oropharyngeal exudate.   Eyes: Conjunctivae normal and EOM are normal. Pupils are equal, round, and reactive to light. No scleral icterus.   Neck: Neck supple. No JVD present.  Cardiovascular: Normal rate, regular rhythm and normal heart sounds.  Exam reveals no gallop and no friction rub.    No murmur heard.  Pulmonary/Chest: Effort normal and breath sounds normal. No respiratory distress. She has no wheezes. She has no rales.        Chest wall mildly tender to palpation on the left, midcalv line just under the breast   Abdominal: Soft. She exhibits no distension. There is no tenderness.   Musculoskeletal: She exhibits no edema.   Lymphadenopathy:     She has no cervical adenopathy.   Neurological: She is alert and oriented to person, place, and time.   Skin: Skin is warm and dry. She is not diaphoretic.        Few scatter excoriations over the back - appx 2mm in cirumference, well circumscribed, darker than surrounding skin       MDM and ED Course     ED Medication Orders      Start     Status Ordering Provider    04/03/13 0818   ibuprofen (ADVIL,MOTRIN) tablet 800 mg   Once      Comments: Please give w/ food    Route: Oral  Ordered Dose: 800 mg         Ordered Kimon Loewen D                 MDM  Chest pain - seems to be msk in origin. Given h/o HTN, HLD, preDM, and the fact that pt is  currently hypertensive, would have concern for dissection although less likely. BP both arms, CXR, labs, UA and preg testing.     D/w lab and nurse about unresulted labs - lab stated they did not have tubes in the lab by that name. D/w nurse who said tubes of blood were "sent back" but that the tech said they were sent to the correct department. Blood has been sent back to lab. Pt notified, understands.    Procedures    Clinical Impression & Disposition     Clinical Impression  Final diagnoses:   None        ED Disposition     None           New Prescriptions    No medications on file        Treatment Team: Scribe: Princess Bruins, DO  Resident  04/03/13 [id number]    Cristino Martes, DO  Resident  04/03/13 1030    Hardie Shackleton Kaylyn Layer, DO  Resident  04/03/13 1051

## 2013-04-03 NOTE — Discharge Instructions (Signed)
Chest Pain of Unclear Etiology     You have been seen for chest pain. The cause of your pain is not yet known.     Your doctor has learned about your medical history, examined you, and checked any tests that were done. Still, it is unclear why you are having pain. The doctor thinks there is only a very small chance that your pain is caused by a life-threatening condition. Later, your primary care doctor might do more tests or check you again.     Sometimes chest pain is caused by a dangerous condition, like a heart attack, aorta injury, blood clot in the lung, or collapsed lung. It is unlikely that your pain is caused by a life-threatening condition if: Your chest pain lasts only a few seconds at a time; you are not short of breath, nauseated (sick to your stomach), sweaty, or lightheaded; your pain gets worse when you twist or bend; your pain improves with exercise or hard work.     Chest pain is serious. It is VERY IMPORTANT that you follow up with your regular doctor and seek medical attention immediately here or at the nearest Emergency Department if your symptoms become worse or they change.     YOU SHOULD SEEK MEDICAL ATTENTION IMMEDIATELY, EITHER HERE OR AT THE NEAREST EMERGENCY DEPARTMENT, IF ANY OF THE FOLLOWING OCCURS:  · Your pain gets worse.  · Your pain makes you short of breath, nauseated, or sweaty.  · Your pain gets worse when you walk, go up stairs, or exert yourself.  · You feel weak, lightheaded, or faint.  · It hurts to breathe.  · Your leg swells.  · Your symptoms get worse or you have new symptoms or concerns.

## 2013-05-30 ENCOUNTER — Emergency Department: Payer: Self-pay

## 2013-05-30 ENCOUNTER — Emergency Department
Admission: EM | Admit: 2013-05-30 | Discharge: 2013-05-30 | Disposition: A | Discharge status: Home / Self Care | Payer: Self-pay | Attending: Emergency Medical Services | Admitting: Emergency Medical Services

## 2013-05-30 DIAGNOSIS — I10 Essential (primary) hypertension: Secondary | ICD-10-CM | POA: Insufficient documentation

## 2013-05-30 DIAGNOSIS — E7800 Pure hypercholesterolemia, unspecified: Secondary | ICD-10-CM | POA: Insufficient documentation

## 2013-05-30 DIAGNOSIS — Z947 Corneal transplant status: Secondary | ICD-10-CM | POA: Insufficient documentation

## 2013-05-30 DIAGNOSIS — S161XXA Strain of muscle, fascia and tendon at neck level, initial encounter: Secondary | ICD-10-CM

## 2013-05-30 DIAGNOSIS — S0990XA Unspecified injury of head, initial encounter: Secondary | ICD-10-CM | POA: Insufficient documentation

## 2013-05-30 DIAGNOSIS — S139XXA Sprain of joints and ligaments of unspecified parts of neck, initial encounter: Secondary | ICD-10-CM | POA: Insufficient documentation

## 2013-05-30 HISTORY — DX: Benign neoplasm of brain, unspecified: D33.2

## 2013-05-30 MED ORDER — IBUPROFEN 800 MG PO TABS
800.0000 mg | ORAL_TABLET | Freq: Three times a day (TID) | ORAL | Status: DC | PRN
Start: 2013-05-30 — End: 2014-09-15

## 2013-05-30 MED ORDER — DIAZEPAM 5 MG PO TABS
5.0000 mg | ORAL_TABLET | Freq: Three times a day (TID) | ORAL | Status: DC | PRN
Start: 2013-05-30 — End: 2015-05-31

## 2013-05-30 MED ORDER — IBUPROFEN 600 MG PO TABS
600.0000 mg | ORAL_TABLET | Freq: Once | ORAL | Status: AC
Start: 2013-05-30 — End: 2013-05-30
  Administered 2013-05-30: 600 mg via ORAL
  Filled 2013-05-30: qty 1
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
## 2013-05-31 NOTE — ED Provider Notes (Signed)
Review of MLP Charts: I have reviewed the history, physical exam, clinical impression(s), & plan AND agree.      Norah Devin S, MD  05/31/13 0730

## 2013-06-02 ENCOUNTER — Ambulatory Visit: Payer: No Typology Code available for payment source | Attending: Ophthalmology | Admitting: Ophthalmology

## 2013-06-02 DIAGNOSIS — H579 Unspecified disorder of eye and adnexa: Secondary | ICD-10-CM

## 2013-06-02 DIAGNOSIS — Z947 Corneal transplant status: Secondary | ICD-10-CM

## 2013-06-02 DIAGNOSIS — H01003 Unspecified blepharitis right eye, unspecified eyelid: Secondary | ICD-10-CM

## 2013-06-02 DIAGNOSIS — H04129 Dry eye syndrome of unspecified lacrimal gland: Secondary | ICD-10-CM

## 2013-06-02 DIAGNOSIS — H01006 Unspecified blepharitis left eye, unspecified eyelid: Secondary | ICD-10-CM | POA: Insufficient documentation

## 2013-06-02 DIAGNOSIS — H18519 Endothelial corneal dystrophy, unspecified eye: Secondary | ICD-10-CM

## 2013-06-02 DIAGNOSIS — H04123 Dry eye syndrome of bilateral lacrimal glands: Secondary | ICD-10-CM

## 2013-06-02 DIAGNOSIS — H01009 Unspecified blepharitis unspecified eye, unspecified eyelid: Secondary | ICD-10-CM

## 2013-06-02 MED ORDER — NEOMYCIN-POLYMYXIN-DEXAMETH 0.1 % OP SUSP
1.0000 [drp] | Freq: Every day | OPHTHALMIC | Status: DC
Start: 2013-06-02 — End: 2015-05-31

## 2013-06-02 NOTE — Progress Notes (Signed)
1. DSEK OS  -appears clear without signs of rejection  -continue durezol qday OS    Continue artificial tear use    2. Fuchs OD  -again patient would like to hold off on any surgery at this point    -continue muro qid od    3. 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  Graft is clear, lens is nicely in place, and posterior pole - from previous visit, was WNL  Will bring back in 4 weeks.  If no improvement at that time will bring back for dilation.    4. Dry eye syndrome  Pt currently only using artifical tears once daily  Will increase to 4-6 times daily   maxitrol ointment qhs  Continue warm compresses  Lid scrubs with johnsons baby shampoo    5. Pseudophakia OS    -stable, well-centered    6. Nuclear sclerosis OD  -not visually significant    -continue to monitor     7. Upper lid swelling OS  No evidence of chalazion, follicular or papillary reaction  Possibly secondary to blepharitis  Warm compresses  maxitrol  Lid scrubs     Bring back in 4 weeks, if no improvement will dilate at that time.

## 2013-06-02 NOTE — Progress Notes (Signed)
Agree with assessment and plan as above.

## 2013-06-30 ENCOUNTER — Ambulatory Visit (INDEPENDENT_AMBULATORY_CARE_PROVIDER_SITE_OTHER): Payer: No Typology Code available for payment source | Admitting: Ophthalmology

## 2013-06-30 DIAGNOSIS — H01009 Unspecified blepharitis unspecified eye, unspecified eyelid: Secondary | ICD-10-CM

## 2013-06-30 DIAGNOSIS — H01006 Unspecified blepharitis left eye, unspecified eyelid: Secondary | ICD-10-CM

## 2013-06-30 DIAGNOSIS — H527 Unspecified disorder of refraction: Secondary | ICD-10-CM | POA: Insufficient documentation

## 2013-06-30 DIAGNOSIS — Z947 Corneal transplant status: Secondary | ICD-10-CM

## 2013-06-30 DIAGNOSIS — H04129 Dry eye syndrome of unspecified lacrimal gland: Secondary | ICD-10-CM

## 2013-06-30 DIAGNOSIS — H579 Unspecified disorder of eye and adnexa: Secondary | ICD-10-CM

## 2013-06-30 DIAGNOSIS — H18519 Endothelial corneal dystrophy, unspecified eye: Secondary | ICD-10-CM

## 2013-06-30 DIAGNOSIS — H526 Other disorders of refraction: Secondary | ICD-10-CM

## 2013-06-30 DIAGNOSIS — H04123 Dry eye syndrome of bilateral lacrimal glands: Secondary | ICD-10-CM

## 2013-06-30 NOTE — Progress Notes (Signed)
1. S/p DSEK OS for fuchs  -clear, no signs of rejection  -try durezol every other day OS    - discussed signs/sx of graft rejection     2. Fuchs OD  -Patient not interested on any surgery at this point    -continue muro qid od    3. Refractive error  Mrx given    4. Dry eye syndrome  Improved symptoms  Continue PF free tears as many times as possible daily    5. MGD  WC and Lid scrubs with johnsons baby shampoo    6. Pseudophakia OS    -stable, well-centered    7. Nuclear sclerosis OD   -not visually significant    -continue to monitor       Follow up: 4 M or sooner prn.

## 2013-08-15 ENCOUNTER — Ambulatory Visit
Admission: RE | Admit: 2013-08-15 | Discharge: 2013-08-15 | Disposition: A | Discharge status: Home / Self Care | Payer: No Typology Code available for payment source | Source: Ambulatory Visit | Attending: Family Medicine | Admitting: Family Medicine

## 2013-08-15 ENCOUNTER — Other Ambulatory Visit: Payer: Self-pay | Admitting: Family Medicine

## 2013-08-15 DIAGNOSIS — M25562 Pain in left knee: Secondary | ICD-10-CM

## 2013-08-15 DIAGNOSIS — M25569 Pain in unspecified knee: Secondary | ICD-10-CM

## 2013-08-15 DIAGNOSIS — M25561 Pain in right knee: Secondary | ICD-10-CM

## 2013-09-30 ENCOUNTER — Other Ambulatory Visit: Payer: Self-pay | Admitting: Family Medicine

## 2013-09-30 DIAGNOSIS — N950 Postmenopausal bleeding: Secondary | ICD-10-CM

## 2013-10-02 ENCOUNTER — Other Ambulatory Visit: Payer: Self-pay | Admitting: Family Medicine

## 2013-10-02 ENCOUNTER — Ambulatory Visit
Admission: RE | Admit: 2013-10-02 | Discharge: 2013-10-02 | Disposition: A | Discharge status: Home / Self Care | Payer: No Typology Code available for payment source | Source: Ambulatory Visit | Attending: Family Medicine | Admitting: Family Medicine

## 2013-10-02 DIAGNOSIS — D252 Subserosal leiomyoma of uterus: Secondary | ICD-10-CM | POA: Insufficient documentation

## 2013-10-02 DIAGNOSIS — N950 Postmenopausal bleeding: Secondary | ICD-10-CM

## 2013-11-03 ENCOUNTER — Ambulatory Visit (INDEPENDENT_AMBULATORY_CARE_PROVIDER_SITE_OTHER): Payer: No Typology Code available for payment source | Admitting: Ophthalmology

## 2013-11-03 DIAGNOSIS — Z961 Presence of intraocular lens: Secondary | ICD-10-CM

## 2013-11-03 DIAGNOSIS — T85398A Other mechanical complication of other ocular prosthetic devices, implants and grafts, initial encounter: Secondary | ICD-10-CM

## 2013-11-03 DIAGNOSIS — T868409 Corneal transplant rejection, unspecified eye: Secondary | ICD-10-CM | POA: Insufficient documentation

## 2013-11-03 NOTE — Progress Notes (Signed)
1. S/p DSEK OS for fuchs  Clear, however, with 2+ cell concerning for rejection  -increase Durezol to 6-8x/day OS  - RTC in 1 week to recheck [facility] rxn    2. Fuchs OD  -Stable, monitor    3. Refractive error  Mrx given at last visit    4. Pseudophakia OS   -stable, well-centered    5. Nuclear sclerosis OD   -not visually significant   -continue to monitor

## 2013-11-03 NOTE — Progress Notes (Signed)
Agree with assessment and plan below.

## 2013-11-10 ENCOUNTER — Ambulatory Visit (INDEPENDENT_AMBULATORY_CARE_PROVIDER_SITE_OTHER): Payer: No Typology Code available for payment source | Admitting: Ophthalmology

## 2013-11-10 DIAGNOSIS — T868409 Corneal transplant rejection, unspecified eye: Secondary | ICD-10-CM

## 2013-11-10 DIAGNOSIS — H18519 Endothelial corneal dystrophy, unspecified eye: Secondary | ICD-10-CM

## 2013-11-10 DIAGNOSIS — T85398A Other mechanical complication of other ocular prosthetic devices, implants and grafts, initial encounter: Secondary | ICD-10-CM

## 2013-11-10 NOTE — Progress Notes (Signed)
1. S/p DSEK OS for fuchs  2+ cell at last visit, resolved currently.  - taper Durezol to QID OS then by 1gtt/week and will maintain on once daily until seen again  - RTC in 1 month to recheck [facility] rxn  - ATs QID OU    2. Fuchs OD  -Stable, monitor    3. Refractive error  Mrx given at last visit    4. Pseudophakia OS   -stable, well-centered    5. Nuclear sclerosis OD   -not visually significant   -continue to monitor

## 2013-11-10 NOTE — Progress Notes (Signed)
Agree with assessment and plan below.

## 2013-11-24 ENCOUNTER — Encounter: Payer: Self-pay | Admitting: Ophthalmology

## 2013-12-08 ENCOUNTER — Ambulatory Visit (INDEPENDENT_AMBULATORY_CARE_PROVIDER_SITE_OTHER): Payer: No Typology Code available for payment source | Admitting: Ophthalmology

## 2013-12-08 DIAGNOSIS — H04129 Dry eye syndrome of unspecified lacrimal gland: Secondary | ICD-10-CM

## 2013-12-08 DIAGNOSIS — H2511 Age-related nuclear cataract, right eye: Secondary | ICD-10-CM

## 2013-12-08 DIAGNOSIS — T868409 Corneal transplant rejection, unspecified eye: Secondary | ICD-10-CM

## 2013-12-08 DIAGNOSIS — H04123 Dry eye syndrome of bilateral lacrimal glands: Secondary | ICD-10-CM

## 2013-12-08 DIAGNOSIS — T85398A Other mechanical complication of other ocular prosthetic devices, implants and grafts, initial encounter: Secondary | ICD-10-CM

## 2013-12-08 DIAGNOSIS — H18519 Endothelial corneal dystrophy, unspecified eye: Secondary | ICD-10-CM

## 2013-12-08 DIAGNOSIS — H2510 Age-related nuclear cataract, unspecified eye: Secondary | ICD-10-CM

## 2013-12-08 NOTE — Progress Notes (Signed)
1. S/p DSEK OS for fuchs  Rare cell, will continue on BID  - Durezol BID OS   - RTC in 1 month to recheck [facility] rxn  - ATs QID OU    2. Fuchs OD  -Stable, monitor    3. Refractive error  Mrx given at recent visit    4. Pseudophakia OS   -stable, well-centered    5. Nuclear sclerosis OD   -not visually significant   -continue to monitor

## 2013-12-08 NOTE — Progress Notes (Signed)
Agree with assessment and plan below. Agree with assessment and plan below.

## 2014-01-07 ENCOUNTER — Ambulatory Visit: Payer: Self-pay | Admitting: Ophthalmology

## 2014-01-08 ENCOUNTER — Ambulatory Visit: Payer: Self-pay | Admitting: Ophthalmology

## 2014-01-21 ENCOUNTER — Ambulatory Visit: Payer: Self-pay | Admitting: Ophthalmology

## 2014-01-27 ENCOUNTER — Ambulatory Visit (INDEPENDENT_AMBULATORY_CARE_PROVIDER_SITE_OTHER): Payer: No Typology Code available for payment source | Admitting: Ophthalmology

## 2014-01-27 DIAGNOSIS — H01009 Unspecified blepharitis unspecified eye, unspecified eyelid: Secondary | ICD-10-CM

## 2014-01-27 DIAGNOSIS — H01003 Unspecified blepharitis right eye, unspecified eyelid: Secondary | ICD-10-CM

## 2014-01-27 DIAGNOSIS — Z947 Corneal transplant status: Secondary | ICD-10-CM

## 2014-01-27 DIAGNOSIS — H1851 Endothelial corneal dystrophy: Secondary | ICD-10-CM

## 2014-01-27 DIAGNOSIS — Z09 Encounter for follow-up examination after completed treatment for conditions other than malignant neoplasm: Secondary | ICD-10-CM

## 2014-01-27 DIAGNOSIS — H18519 Endothelial corneal dystrophy, unspecified eye: Secondary | ICD-10-CM

## 2014-01-27 DIAGNOSIS — H01006 Unspecified blepharitis left eye, unspecified eyelid: Secondary | ICD-10-CM

## 2014-01-27 NOTE — Progress Notes (Signed)
1. S/p DSEK OS for fuchs  Rare cell, will continue on BID  - Durezol BID OS   - RTC in 2 month to recheck [facility] rxn  - ATs 4-6x/day OU    2. Fuchs OD  -Stable, monitor    3. Refractive error  Mrx given at recent visit    4. Pseudophakia OS   -stable, well-centered    5. Nuclear sclerosis OD   -not visually significant   -continue to monitor    6. Blepharitis OU - Emphasized importance of regular warm compresses BID-TID, ATs as discussed above, lid scrubs, and lubricating ointment qhs    Follow up in 2 months for AC/symptom check

## 2014-01-28 NOTE — Progress Notes (Signed)
Agree with assessment and plan below.

## 2014-02-24 ENCOUNTER — Other Ambulatory Visit (INDEPENDENT_AMBULATORY_CARE_PROVIDER_SITE_OTHER): Payer: Self-pay | Admitting: Registered Nurse

## 2014-02-24 DIAGNOSIS — Z1231 Encounter for screening mammogram for malignant neoplasm of breast: Secondary | ICD-10-CM

## 2014-02-27 ENCOUNTER — Ambulatory Visit
Admission: RE | Admit: 2014-02-27 | Discharge: 2014-02-27 | Disposition: A | Discharge status: Home / Self Care | Payer: No Typology Code available for payment source | Source: Ambulatory Visit | Attending: Registered Nurse | Admitting: Registered Nurse

## 2014-02-27 DIAGNOSIS — Z1231 Encounter for screening mammogram for malignant neoplasm of breast: Secondary | ICD-10-CM

## 2014-03-23 ENCOUNTER — Ambulatory Visit (INDEPENDENT_AMBULATORY_CARE_PROVIDER_SITE_OTHER): Payer: No Typology Code available for payment source | Admitting: Ophthalmology

## 2014-03-23 DIAGNOSIS — Z947 Corneal transplant status: Secondary | ICD-10-CM

## 2014-03-23 DIAGNOSIS — H18519 Endothelial corneal dystrophy, unspecified eye: Secondary | ICD-10-CM

## 2014-03-23 DIAGNOSIS — H2511 Age-related nuclear cataract, right eye: Secondary | ICD-10-CM

## 2014-03-23 DIAGNOSIS — H1851 Endothelial corneal dystrophy: Secondary | ICD-10-CM

## 2014-03-23 NOTE — Progress Notes (Signed)
Agree with assessment and plan below.

## 2014-03-23 NOTE — Progress Notes (Signed)
1. S/p DSEK OS for fuchs 07/2011  - AC quiet on Durezol QD, reduce to QOD  - RTC in 2 month to recheck [facility] rxn    2. Blepharitis/DES OS  - warm compresses 15 min/day  - PFAT 6/day day  - AT ung QHS    3. Fuchs OD  -Stable, monitor    4. Pseudophakia OS   -stable, well-centered    5. Nuclear sclerosis OD   -not visually significant   -continue to monitor    6. Refractive error  - Mrx given at recent visit    RTC 2 mo AC and cornea check or sooner prn

## 2014-03-30 ENCOUNTER — Ambulatory Visit (INDEPENDENT_AMBULATORY_CARE_PROVIDER_SITE_OTHER): Payer: No Typology Code available for payment source | Admitting: Ophthalmology

## 2014-03-30 DIAGNOSIS — H01003 Unspecified blepharitis right eye, unspecified eyelid: Secondary | ICD-10-CM

## 2014-03-30 DIAGNOSIS — H01006 Unspecified blepharitis left eye, unspecified eyelid: Secondary | ICD-10-CM

## 2014-03-30 DIAGNOSIS — H1851 Endothelial corneal dystrophy: Secondary | ICD-10-CM

## 2014-03-30 DIAGNOSIS — Z961 Presence of intraocular lens: Secondary | ICD-10-CM

## 2014-03-30 DIAGNOSIS — Z947 Corneal transplant status: Secondary | ICD-10-CM

## 2014-03-30 DIAGNOSIS — H18519 Endothelial corneal dystrophy, unspecified eye: Secondary | ICD-10-CM

## 2014-03-30 NOTE — Progress Notes (Signed)
1. S/p DSEK OS 07/2011  - for Fuchs  - cornea clear, AC quiet, IOP WNL   - cont Durezol QD OS only     2. Blepharitis/DES OS  - warm compresses 15 min/day, pt educated on technique  - PFAT 6/day, more PRN  - AT ung QHS if can tolerate   - omega-3, flax seed    3. Fuchs OD  - Stable, monitor    4. Pseudophakia OS   - stable, well-centered    5. Nuclear sclerosis OD   - not visually significant     6. Refractive error  - Mrx at recent visit    RTC 2 mo as scheduled AC and cornea check, sooner PRN

## 2014-03-30 NOTE — Progress Notes (Signed)
Agree with assessment and plan below.

## 2014-04-09 ENCOUNTER — Other Ambulatory Visit: Payer: Self-pay | Admitting: Family

## 2014-04-09 ENCOUNTER — Ambulatory Visit
Admission: RE | Admit: 2014-04-09 | Discharge: 2014-04-09 | Disposition: A | Discharge status: Home / Self Care | Payer: Charity | Source: Ambulatory Visit | Attending: Family | Admitting: Family

## 2014-04-09 DIAGNOSIS — R059 Cough, unspecified: Secondary | ICD-10-CM

## 2014-04-09 DIAGNOSIS — R05 Cough: Secondary | ICD-10-CM | POA: Insufficient documentation

## 2014-05-25 ENCOUNTER — Encounter: Payer: Self-pay | Admitting: Ophthalmology

## 2014-05-25 ENCOUNTER — Ambulatory Visit (INDEPENDENT_AMBULATORY_CARE_PROVIDER_SITE_OTHER): Payer: No Typology Code available for payment source | Admitting: Ophthalmology

## 2014-05-25 DIAGNOSIS — H1851 Endothelial corneal dystrophy: Secondary | ICD-10-CM

## 2014-05-25 DIAGNOSIS — H18519 Endothelial corneal dystrophy, unspecified eye: Secondary | ICD-10-CM

## 2014-05-25 NOTE — Progress Notes (Signed)
1. S/p DSEK OS 07/2011  - for Fuchs  - cornea clear, AC quiet, IOP WNL   - cont Durezol QoD OS only     2. Blepharitis/DES OS  - warm compresses 15 min/day, pt educated on technique  - lid scrubs with baby shampoo  - PFAT 6/day, more PRN  - AT ung QHS if can tolerate   - omega-3, flax seed    3. Fuchs OD  - Stable, monitor    4. Pseudophakia OS   - stable, well-centered  - happy with monovision (near OS)    5. Nuclear sclerosis OD   - not visually significant     6. Refractive error  - MRx next visit    RTC 3 months - MRx, DFE

## 2014-05-26 NOTE — Progress Notes (Signed)
Agree with assessment and plan below.

## 2014-07-07 ENCOUNTER — Emergency Department
Admission: EM | Admit: 2014-07-07 | Discharge: 2014-07-07 | Disposition: A | Discharge status: Home / Self Care | Payer: No Typology Code available for payment source | Attending: Emergency Medical Services | Admitting: Emergency Medical Services

## 2014-07-07 ENCOUNTER — Emergency Department: Payer: No Typology Code available for payment source

## 2014-07-07 DIAGNOSIS — I10 Essential (primary) hypertension: Secondary | ICD-10-CM | POA: Insufficient documentation

## 2014-07-07 DIAGNOSIS — Z947 Corneal transplant status: Secondary | ICD-10-CM | POA: Insufficient documentation

## 2014-07-07 DIAGNOSIS — R0789 Other chest pain: Secondary | ICD-10-CM | POA: Insufficient documentation

## 2014-07-07 DIAGNOSIS — R079 Chest pain, unspecified: Secondary | ICD-10-CM

## 2014-07-07 DIAGNOSIS — E780 Pure hypercholesterolemia: Secondary | ICD-10-CM | POA: Insufficient documentation

## 2014-07-07 LAB — BASIC METABOLIC PANEL
Anion Gap: 9 (ref 5.0–15.0)
BUN: 14 mg/dL (ref 7–19)
CO2: 27 mEq/L (ref 22–29)
Calcium: 8.9 mg/dL (ref 8.5–10.5)
Chloride: 102 mEq/L (ref 100–111)
Creatinine: 0.8 mg/dL (ref 0.6–1.0)
Glucose: 98 mg/dL (ref 70–100)
Potassium: 3.7 mEq/L (ref 3.5–5.1)
Sodium: 138 mEq/L (ref 136–145)

## 2014-07-07 LAB — CBC AND DIFFERENTIAL
Basophils Absolute Automated: 0.02 10*3/uL (ref 0.00–0.20)
Basophils Automated: 0 %
Eosinophils Absolute Automated: 0.14 10*3/uL (ref 0.00–0.70)
Eosinophils Automated: 3 %
Hematocrit: 37.8 % (ref 37.0–47.0)
Hgb: 12.5 g/dL (ref 12.0–16.0)
Immature Granulocytes Absolute: 0.01 10*3/uL
Immature Granulocytes: 0 %
Lymphocytes Absolute Automated: 1.69 10*3/uL (ref 0.50–4.40)
Lymphocytes Automated: 38 %
MCH: 28.5 pg (ref 28.0–32.0)
MCHC: 33.1 g/dL (ref 32.0–36.0)
MCV: 86.3 fL (ref 80.0–100.0)
MPV: 9.9 fL (ref 9.4–12.3)
Monocytes Absolute Automated: 0.75 10*3/uL (ref 0.00–1.20)
Monocytes: 17 %
Neutrophils Absolute: 1.86 10*3/uL (ref 1.80–8.10)
Neutrophils: 42 %
Nucleated RBC: 0 /100 WBC (ref 0–1)
Platelets: 322 10*3/uL (ref 140–400)
RBC: 4.38 10*6/uL (ref 4.20–5.40)
RDW: 14 % (ref 12–15)
WBC: 4.46 10*3/uL (ref 3.50–10.80)

## 2014-07-07 LAB — GFR: EGFR: >60

## 2014-07-07 LAB — TROPONIN I: Troponin I: <0.01 ng/mL (ref 0.00–0.09)

## 2014-07-07 MED ORDER — ALUM & MAG HYDROXIDE-SIMETH 200-200-20 MG/5ML PO SUSP
30.0000 mL | Freq: Once | ORAL | Status: AC
Start: 2014-07-07 — End: 2014-07-07
  Administered 2014-07-07: 30 mL via ORAL
  Filled 2014-07-07: qty 30

## 2014-07-07 MED ORDER — ASPIRIN 81 MG PO CHEW
162.0000 mg | CHEWABLE_TABLET | Freq: Once | ORAL | Status: AC
Start: 2014-07-07 — End: 2014-07-07
  Administered 2014-07-07: 162 mg via ORAL
  Filled 2014-07-07: qty 2

## 2014-07-07 MED ORDER — LIDOCAINE VISCOUS 2 % MT SOLN
10.0000 mL | Freq: Once | OROMUCOSAL | Status: AC
Start: 2014-07-07 — End: 2014-07-07
  Administered 2014-07-07: 10 mL via OROMUCOSAL
  Filled 2014-07-07: qty 15

## 2014-07-07 NOTE — Discharge Instructions (Signed)
Chest Pain of Unclear Etiology     You have been seen for chest pain. The cause of your pain is not yet known.     Your doctor has learned about your medical history, examined you, and checked any tests that were done. Still, it is unclear why you are having pain. The doctor thinks there is only a very small chance that your pain is caused by a life-threatening condition. Later, your primary care doctor might do more tests or check you again.     Sometimes chest pain is caused by a dangerous condition, like a heart attack, aorta injury, blood clot in the lung, or collapsed lung. It is unlikely that your pain is caused by a life-threatening condition if: Your chest pain lasts only a few seconds at a time; you are not short of breath, nauseated (sick to your stomach), sweaty, or lightheaded; your pain gets worse when you twist or bend; your pain improves with exercise or hard work.     Chest pain is serious. It is VERY IMPORTANT that you follow up with your regular doctor and seek medical attention immediately here or at the nearest Emergency Department if your symptoms become worse or they change.     YOU SHOULD SEEK MEDICAL ATTENTION IMMEDIATELY, EITHER HERE OR AT THE NEAREST EMERGENCY DEPARTMENT, IF ANY OF THE FOLLOWING OCCURS:  · Your pain gets worse.  · Your pain makes you short of breath, nauseated, or sweaty.  · Your pain gets worse when you walk, go up stairs, or exert yourself.  · You feel weak, lightheaded, or faint.  · It hurts to breathe.  · Your leg swells.  · Your symptoms get worse or you have new symptoms or concerns.
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
## 2014-07-07 NOTE — ED Provider Notes (Addendum)
Physician/Midlevel provider first contact with patient: 07/07/14 1043         History     Chief Complaint   Patient presents with   . Chest Pain     Patient had frequent vomiting generalized malaise late in bed most of Saturday after having this she is developed intermittent chest pain with increased reflux symptoms the chest pain is substernal and left sided last seconds there is no radiation there is no fevers.  There is no shortness of breath and is not pleuritic.  She is a nonsmoker.    She continues to have these symptoms intermittently since that time she was seen by her doctor referred a properly for further evaluation.    There are no fevers or chills associated    Moderate intermittent symptoms lasting seconds history provided by patient no other modifiers      Past Medical History   Diagnosis Date   . Hypertensive disorder    . Hypercholesteremia    . Brain tumor (benign)        Past Surgical History   Procedure Laterality Date   . Brain surgery  2009   . Corneal transplant     . Breast cyst excision  1970s     right breast tumor removed 40+ years ago - benign   . Breast biopsy Right 2008     right breast biopsy- benign   . Breast biopsy Right 1970s     right breast tumor removed 40+ years ago - benign       Family History   Problem Relation Age of Onset   . Breast cancer Neg Hx        Social  History   Substance Use Topics   . Smoking status: Never Smoker    . Smokeless tobacco: Not on file   . Alcohol Use: No       .     Allergies   Allergen Reactions   . Iodine    . Penicillins    . Tetanus Immune Globulin    . Zocor [Simvastatin]        Home Medications     Last Medication Reconciliation Action:  In Progress Leonarda Salon, RN 07/07/2014 10:45 AM                  atorvastatin (LIPITOR) 20 MG tablet     Take 20 mg by mouth daily.     diazepam (VALIUM) 5 MG tablet     Take 1 tablet (5 mg total) by mouth every 8 (eight) hours as needed (as needed for spasm).     difluprednate (DUREZOL) 0.05 % Emulsion  ophthalmic solution     Place 1 drop into the left eye daily.      ibuprofen (ADVIL,MOTRIN) 800 MG tablet     Take 1 tablet (800 mg total) by mouth every 8 (eight) hours as needed for Pain. Take with food.     ibuprofen (ADVIL,MOTRIN) 800 MG tablet     Take 1 tablet (800 mg total) by mouth every 8 (eight) hours as needed for Pain or Fever.     lisinopril-hydrochlorothiazide (PRINZIDE,ZESTORETIC) 10-12.5 MG per tablet     Take 1 tablet by mouth daily.     neomycin-polymyxin-dexamethasone (MAXITROL) 0.1 % ophthalmic suspension     Place 1 drop into the left eye daily.     Nepafenac (ILEVRO) 0.3 % Suspension     Place 1 drop into the left eye daily.  tropicamide (MYDRIACYL) 1 % ophthalmic solution     Place 1 drop into the left eye daily as needed.           Review of Systems   Constitutional: Negative for fever.   HENT: Negative for congestion.    Respiratory: Negative for shortness of breath.    Cardiovascular: Positive for chest pain.   Gastrointestinal: Negative for abdominal pain.   Genitourinary: Negative for dysuria.   Musculoskeletal: Negative.    Skin: Negative for rash.   Neurological: Negative for headaches.   Psychiatric/Behavioral: Negative for dysphoric mood.   All other systems reviewed and are negative.      Physical Exam    BP: 135/81 mmHg, Heart Rate: 72, Temp: 98 F (36.7 C), Resp Rate: 18, SpO2: 96 %, Weight: 76.658 kg     Physical Exam   Constitutional:   Well Appearing   HENT:   Head: Normocephalic.   Eyes: Conjunctivae are normal.   Neck:   Normal Inspection   Cardiovascular: Normal rate, regular rhythm and normal heart sounds.    Pulmonary/Chest: Breath sounds normal.   Abdominal: Soft. Bowel sounds are normal. There is no tenderness.   Musculoskeletal: She exhibits no edema or tenderness.   Normal Upper Extremity Inspection   Neurological: She is alert. She has normal strength.   Skin: Skin is warm and dry.   Psychiatric: She has a normal mood and affect.         MDM and ED Course     ED  Medication Orders     Start     Status Ordering Provider    07/07/14 1052  aspirin chewable tablet 162 mg   Once     Route: Oral  Ordered Dose: 162 mg     Last MAR action:  Given Olar Santini J    07/07/14 1052  lidocaine viscous (XYLOCAINE) 2 % mouth solution 10 mL   Once     Route: Mouth/Throat  Ordered Dose: 10 mL     Last MAR action:  Given Kamiyah Kindel J    07/07/14 1052  alum & mag hydroxide-simethicone (MAALOX PLUS) 200-200-20 mg/5 mL suspension 30 mL   Once     Route: Oral  Ordered Dose: 30 mL     Last MAR action:  Given Janiylah Hannis J              MDM    monitor shows normal sinus rhythm 60s no acute ST elevation    She has atypical chest pain lasting seconds that occurred after a nausea vomiting illness on Saturday.  The pain is short-lived nonexertional seems more consistent with reflux.    Advised that she developed more long-standing pain significant shortness of breath exertional symptoms seek reevaluation recommended PPI for now    Procedures    Clinical Impression & Disposition     Clinical Impression  Final diagnoses:   Chest pain at rest        ED Disposition     Discharge Leone Brand discharge to home/self care.    Condition at disposition: Stable             New Prescriptions    No medications on file                   Chase Picket, MD  07/07/14 1054    Chase Picket, MD  07/07/14 1200

## 2014-07-08 LAB — ECG 12-LEAD
Atrial Rate: 65 {beats}/min
P Axis: 76 degrees
P-R Interval: 142 ms
Q-T Interval: 390 ms
QRS Duration: 84 ms
QTC Calculation (Bezet): 405 ms
R Axis: 49 degrees
T Axis: 70 degrees
Ventricular Rate: 65 {beats}/min

## 2014-08-10 ENCOUNTER — Ambulatory Visit
Admission: RE | Admit: 2014-08-10 | Discharge: 2014-08-10 | Disposition: A | Discharge status: Home / Self Care | Payer: No Typology Code available for payment source | Source: Ambulatory Visit | Attending: Registered Nurse | Admitting: Registered Nurse

## 2014-08-10 DIAGNOSIS — R079 Chest pain, unspecified: Secondary | ICD-10-CM | POA: Insufficient documentation

## 2014-09-07 ENCOUNTER — Ambulatory Visit (INDEPENDENT_AMBULATORY_CARE_PROVIDER_SITE_OTHER): Payer: Charity | Admitting: Ophthalmology

## 2014-09-07 DIAGNOSIS — T868409 Corneal transplant rejection, unspecified eye: Secondary | ICD-10-CM

## 2014-09-07 DIAGNOSIS — T86840 Corneal transplant rejection: Secondary | ICD-10-CM

## 2014-09-07 NOTE — Progress Notes (Signed)
1. 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

## 2014-09-15 ENCOUNTER — Emergency Department: Payer: Auto Insurance (includes no fault)

## 2014-09-15 ENCOUNTER — Emergency Department
Admission: EM | Admit: 2014-09-15 | Discharge: 2014-09-15 | Disposition: A | Discharge status: Home / Self Care | Payer: Auto Insurance (includes no fault) | Attending: Emergency Medicine | Admitting: Emergency Medicine

## 2014-09-15 ENCOUNTER — Emergency Department: Payer: Self-pay

## 2014-09-15 DIAGNOSIS — S93601A Unspecified sprain of right foot, initial encounter: Secondary | ICD-10-CM | POA: Insufficient documentation

## 2014-09-15 DIAGNOSIS — S46911A Strain of unspecified muscle, fascia and tendon at shoulder and upper arm level, right arm, initial encounter: Secondary | ICD-10-CM

## 2014-09-15 DIAGNOSIS — I10 Essential (primary) hypertension: Secondary | ICD-10-CM | POA: Insufficient documentation

## 2014-09-15 DIAGNOSIS — Y92410 Unspecified street and highway as the place of occurrence of the external cause: Secondary | ICD-10-CM | POA: Insufficient documentation

## 2014-09-15 MED ORDER — ONDANSETRON 4 MG PO TBDP
4.0000 mg | ORAL_TABLET | Freq: Once | ORAL | Status: DC
Start: 2014-09-15 — End: 2014-09-15

## 2014-09-15 MED ORDER — CYCLOBENZAPRINE HCL 10 MG PO TABS
10.0000 mg | ORAL_TABLET | Freq: Once | ORAL | Status: AC
Start: 2014-09-15 — End: 2014-09-15
  Administered 2014-09-15: 10 mg via ORAL
  Filled 2014-09-15: qty 1

## 2014-09-15 MED ORDER — HYDROCODONE-ACETAMINOPHEN 5-325 MG PO TABS
2.0000 | ORAL_TABLET | Freq: Once | ORAL | Status: AC
Start: 2014-09-15 — End: 2014-09-15
  Administered 2014-09-15: 2 via ORAL
  Filled 2014-09-15: qty 2

## 2014-09-15 MED ORDER — IBUPROFEN 600 MG PO TABS
600.0000 mg | ORAL_TABLET | Freq: Four times a day (QID) | ORAL | Status: DC | PRN
Start: 2014-09-15 — End: 2016-11-13

## 2014-09-15 MED ORDER — CYCLOBENZAPRINE HCL 10 MG PO TABS
10.0000 mg | ORAL_TABLET | Freq: Three times a day (TID) | ORAL | Status: AC | PRN
Start: 2014-09-15 — End: 2014-09-30
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
## 2014-09-15 NOTE — Discharge Instructions (Signed)
Use cast shoe for comfort and follow up with Podiatry below.  It is possible you have a fracture but is unclear on xray today so follow up is needed.    Return to the ER for worsening    Read all below information and report this visit to your doctor    Sedating Medication (Edu)    You have been given a medicine or prescription for medicine that causes drowsiness (sleepiness) or dizziness. While taking this medicine, DO NOT drive a car. DO NOT operate machinery. DO NOT perform jobs where you need to be alert.    DO NOT drink alcoholic beverages while taking this medicine.    If you get dizzy, sit or lie down at the first signs. Be careful going up and down stairs.    Never give this medicine to others.    Keep this medicine out of reach of children.    Do not take or save old medicines. Throw them away when outdated.    Keep all medicines in a cool, dry place. DO NOT keep them in your bathroom medicine cabinet or in a cabinet above the stove.                Wendy [name]  [id number]  [id number]  [id number]  09/15/2014    Discharge Instructions    As always, you are the most important factor in your recovery.  Please follow these instructions carefully.  If you have problems that we have not discussed, CALL OR VISIT YOUR DOCTOR RIGHT AWAY.     If you can't reach your doctor, return to the emergency department.    I Leone Brand understand the written and discussed instructions.  My questions have been answered.  I acknowledge receipt of these instructions.     Patient or responsible person:         Patient's Signature               Physician or Nurse                Shoulder Strain    You have been diagnosed with a shoulder strain.    The shoulder joint is surrounded by several muscles. A strain happens when a muscle is stretched or partly torn. This usually happens from using the muscle too much or doing an activity the muscle is not used to. Strains should not be confused with sprains. Sprains are  injuries to the ligaments. Ligaments hold bones together.    An injury to the shoulder can be especially painful. This is because when you move your arm, you also have to move your shoulder.    Treatment often includes resting the shoulder and using pain medicines. A sling is sometimes used. If your doctor places you in a sling, it is important to take your arm out of the sling every 2 or 3 hours. Move it around. This way, your shoulder will not "freeze." If you get a "frozen shoulder," problems like more pain or severe (serious) stiffness may develop.    Treatment also includes using ice on the painful area. Putting ice on the affected area can reduce swelling and pain. Put some ice cubes in a re-sealable (Ziploc) bag and add some water. Put a thin washcloth between the bag and the skin. Apply the ice bag to the area for at least 20 minutes. Do this at least 4 times per day. It is okay to use the ice more often and to  apply it longer. NEVER APPLY ICE DIRECTLY TO THE SKIN.    Try to keep the injured shoulder elevated (lifted). You can sit up in a chair or recliner and sleep on an extra pillow in bed at night.    Take the medicine your doctor has prescribed. These medicines often help with pain and inflammation.    YOU SHOULD SEEK MEDICAL ATTENTION IMMEDIATELY, EITHER HERE OR AT THE NEAREST EMERGENCY DEPARTMENT, IF ANY OF THE FOLLOWING OCCURS:   Numbness or tingling in your arm or hand.   A cool, pale hand.   Severe (serious) neck or shoulder pain that acetaminophen (Tylenol), ibuprofen (Advil or Motrin) or the medicine prescribed by the doctor does not help.

## 2014-09-17 ENCOUNTER — Other Ambulatory Visit: Payer: Self-pay | Admitting: Family Medicine

## 2014-09-17 DIAGNOSIS — Z87828 Personal history of other (healed) physical injury and trauma: Secondary | ICD-10-CM

## 2014-09-18 ENCOUNTER — Other Ambulatory Visit: Payer: Self-pay | Admitting: Family Medicine

## 2014-09-18 ENCOUNTER — Ambulatory Visit
Admission: RE | Admit: 2014-09-18 | Discharge: 2014-09-18 | Disposition: A | Discharge status: Home / Self Care | Payer: No Typology Code available for payment source | Source: Ambulatory Visit | Attending: Family Medicine | Admitting: Family Medicine

## 2014-09-18 DIAGNOSIS — Z87820 Personal history of traumatic brain injury: Secondary | ICD-10-CM

## 2014-09-18 DIAGNOSIS — Z87828 Personal history of other (healed) physical injury and trauma: Secondary | ICD-10-CM | POA: Insufficient documentation

## 2014-09-22 ENCOUNTER — Ambulatory Visit (INDEPENDENT_AMBULATORY_CARE_PROVIDER_SITE_OTHER): Payer: Charity | Admitting: Ophthalmology

## 2014-09-22 DIAGNOSIS — T86840 Corneal transplant rejection: Secondary | ICD-10-CM

## 2014-09-22 DIAGNOSIS — H1851 Endothelial corneal dystrophy: Secondary | ICD-10-CM

## 2014-09-22 DIAGNOSIS — T868409 Corneal transplant rejection, unspecified eye: Secondary | ICD-10-CM

## 2014-09-22 DIAGNOSIS — H527 Unspecified disorder of refraction: Secondary | ICD-10-CM

## 2014-09-22 DIAGNOSIS — H35372 Puckering of macula, left eye: Secondary | ICD-10-CM

## 2014-09-22 DIAGNOSIS — H18519 Endothelial corneal dystrophy, unspecified eye: Secondary | ICD-10-CM
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
## 2014-09-22 NOTE — Progress Notes (Signed)
OCT done with interpretation. Findings will be discussed with the patient during the upcoming scheduled visit.

## 2014-09-28 ENCOUNTER — Ambulatory Visit: Payer: Self-pay | Admitting: Ophthalmology

## 2014-11-23 ENCOUNTER — Ambulatory Visit (INDEPENDENT_AMBULATORY_CARE_PROVIDER_SITE_OTHER): Payer: No Typology Code available for payment source | Admitting: Ophthalmology

## 2014-11-23 DIAGNOSIS — H43811 Vitreous degeneration, right eye: Secondary | ICD-10-CM

## 2014-11-23 NOTE — Progress Notes (Signed)
1. Acute PVD OD: no rt/rd today  2. Fuch's  3. Hx of PCIOl/DSEK OS    Plan:   - RD precautions d/w patient  - RTC 6 weeks for [facility] OU

## 2014-12-02 ENCOUNTER — Ambulatory Visit: Payer: No Typology Code available for payment source | Admitting: Ophthalmology

## 2014-12-09 ENCOUNTER — Ambulatory Visit (INDEPENDENT_AMBULATORY_CARE_PROVIDER_SITE_OTHER): Payer: Self-pay | Admitting: Ophthalmology

## 2014-12-09 DIAGNOSIS — H18519 Endothelial corneal dystrophy, unspecified eye: Secondary | ICD-10-CM

## 2014-12-09 DIAGNOSIS — H1851 Endothelial corneal dystrophy: Secondary | ICD-10-CM

## 2014-12-09 DIAGNOSIS — H35372 Puckering of macula, left eye: Secondary | ICD-10-CM

## 2014-12-09 DIAGNOSIS — H43819 Vitreous degeneration, unspecified eye: Secondary | ICD-10-CM

## 2014-12-09 NOTE — Progress Notes (Signed)
1. S/p DSEK OS 07/2011  - cornea clear, AC quiet, IOP WNL   - continue Durezol daily as usual     2. Blepharitis/DES OS  - warm compresses 15 min bid, pt educated on technique  - lid scrubs with baby shampoo bid  - PFAT 6/day, more PRN (has been using AT 2-3x OS only)  - AT ung QHS if can tolerate   - omega-3, flax seed    3. Fuchs OD  - Stable, monitor    4. Pseudophakia OS   - stable, well-centered  - monovision (near OS)    5. 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 Wants new glasses for distance. Current pair 59 years old, not anisometropic  Tried rx in trial frames, tolerated well. rx given for -1.00 +0.50, -2.50 +0.75    6. ERM OS with mild thickening   - has seen retina, no plans for intervention    7. Nuclear sclerosis OD   - not visually significant      8. PVD OD  - seen by retina, with no rt/rd. RD precautions discussed  - due for repeat DFE with retina in 4 weeks

## 2014-12-09 NOTE — Progress Notes (Signed)
Agree with assessment and plan below.

## 2014-12-31 ENCOUNTER — Ambulatory Visit: Payer: No Typology Code available for payment source | Admitting: Ophthalmology

## 2015-01-25 ENCOUNTER — Encounter: Payer: Self-pay | Admitting: Ophthalmology

## 2015-01-25 ENCOUNTER — Ambulatory Visit (INDEPENDENT_AMBULATORY_CARE_PROVIDER_SITE_OTHER): Payer: Self-pay | Admitting: Ophthalmology

## 2015-01-25 DIAGNOSIS — H43811 Vitreous degeneration, right eye: Secondary | ICD-10-CM

## 2015-01-25 NOTE — Progress Notes (Signed)
1. PVD OD: no rt/rd today  2. Fuch's  3. Hx of PCIOl/DSEK OS    Plan:   - RD precautions d/w patient  - F/u in general in 1-2 months, retina prn

## 2015-03-19 ENCOUNTER — Other Ambulatory Visit: Payer: Self-pay | Admitting: Family Medicine

## 2015-03-19 DIAGNOSIS — Z1231 Encounter for screening mammogram for malignant neoplasm of breast: Secondary | ICD-10-CM

## 2015-03-25 ENCOUNTER — Ambulatory Visit
Admission: RE | Admit: 2015-03-25 | Discharge: 2015-03-25 | Disposition: A | Discharge status: Home / Self Care | Payer: No Typology Code available for payment source | Source: Ambulatory Visit | Attending: Family Medicine | Admitting: Family Medicine

## 2015-03-25 DIAGNOSIS — Z1231 Encounter for screening mammogram for malignant neoplasm of breast: Secondary | ICD-10-CM | POA: Insufficient documentation

## 2015-03-30 ENCOUNTER — Ambulatory Visit (INDEPENDENT_AMBULATORY_CARE_PROVIDER_SITE_OTHER): Payer: No Typology Code available for payment source | Admitting: Ophthalmology

## 2015-03-30 DIAGNOSIS — H04123 Dry eye syndrome of bilateral lacrimal glands: Secondary | ICD-10-CM

## 2015-03-30 DIAGNOSIS — H1851 Endothelial corneal dystrophy: Secondary | ICD-10-CM

## 2015-03-30 DIAGNOSIS — H18519 Endothelial corneal dystrophy, unspecified eye: Secondary | ICD-10-CM

## 2015-03-30 DIAGNOSIS — Z947 Corneal transplant status: Secondary | ICD-10-CM

## 2015-03-30 NOTE — Progress Notes (Signed)
1. S/p DSEK OS 07/2011  - cornea clear, AC quiet, IOP WNL   - continue Durezol daily as usual     2. Blepharitis/DES OS  - warm compresses 15 min bid, pt educated on technique  - lid scrubs with baby shampoo bid instructed to d/c  - recommended lid scrubs with OTC cloth instead to see if irritation is less  - PFAT 6/day, more PRN  - AT ung QHS if can tolerate   - omega-3, flax seed    3. Fuchs OD  - Stable, monitor    4. Pseudophakia OS   - stable, well-centered  - monovision (near OS)    5. Refractive error  - stable, monitor  - monovision OS with CEIOL    6. ERM OS with mild thickening   - has seen retina, no plans for intervention    7. Nuclear sclerosis OD   - not visually significant      8. PVD OD  - seen by retina, with no rt/rd.  - RD precautions discussed    rtc in 4-6 months for cornea and IOP check

## 2015-03-31 NOTE — Progress Notes (Signed)
Agree with assessment and plan below.

## 2015-05-31 ENCOUNTER — Ambulatory Visit (INDEPENDENT_AMBULATORY_CARE_PROVIDER_SITE_OTHER): Payer: No Typology Code available for payment source | Admitting: Adult Health

## 2015-05-31 ENCOUNTER — Encounter (INDEPENDENT_AMBULATORY_CARE_PROVIDER_SITE_OTHER): Payer: Self-pay

## 2015-05-31 VITALS — BP 153/93 | HR 74 | Temp 98.0°F | Resp 20

## 2015-05-31 DIAGNOSIS — S0083XA Contusion of other part of head, initial encounter: Secondary | ICD-10-CM

## 2015-05-31 NOTE — Patient Instructions (Signed)
Facial Contusion  A facial contusion is a bruise with swelling. You may also have bleeding under the skin. The swelling should start to go down within 2 days. You don't have any signs of a serious injury at this time. But symptoms may show up later that could be a sign of a more serious problem. You should watch for the warning signs listed below.  Home care  The following guidelines will help you care for your injury at home:   If you have swelling of the face, use an ice pack for 20 minutes every 1 to 2 hours until the swelling starts to go down. You can make your own ice pack by putting crushed ice in a plastic bag. Wrap the bag in a thin towel.   If you have scrapes or cuts on your face, clean them daily with soap and water. Put an antibiotic ointment or cream on them for the first few days to prevent infection.   You may use acetaminophen to control pain, unless another pain medicine was prescribed.If you have chronic liver or kidney disease, talk with your doctor before usingthis medicine. If this is for your child, talk with the doctor before giving your child any over-the-counter medicine. This is especially true in children younger than 6 months old.   For the next 24 hours:   Don't drink alcohol, or use sedatives or medicines that make you sleepy.   Don't drive or operate machinery.   Avoid doing anything strenuous. Don't lift or strain.  Special note on concussions  If you had any symptoms of a concussion today, don't return to sports or other activity that could result in another head injury.   These are symptoms of concussion:   Nausea   Vomiting   Dizziness   Confusion   Headache   Memory loss   Loss on consciousness  Wait until all symptoms are gone and your health care provider says it's OK before returning to your activity. A second head injury before fully recovering from the first one can lead to serious brain injury.  Follow-up care  Follow up with your health care provider  in 1 week or as directed.  If you hadX-rays or CT scans taken, they will be looked at bya radiologist. You will be told of any new findings that may affect your care.  When to seek medical advice  Call your health care provider right awayif any of these occur:   Repeated vomiting   Severe or worsening headache or dizziness   Unusual drowsiness, or unable to awaken as usual   Confusion or change in behavior or speech, memory loss, or blurred vision   Convulsion (seizure)   Scalp or face swelling that gets worse   Redness, warmth, or pus from the swollen area   Fluid drainage or bleeding from the nose or ears   Fever of 100.4F (38C) or higher, or as directed by your health care provider   Pain in your jaw that gets worse when you chew   Pain in your sinuses that gets worse   Nose looks crooked or you can't breathe through your nose after swelling goes down   2000-2015 [employer] [address], [place] [address]. All rights reserved. This information is not intended as a substitute for professional medical care. Always follow your healthcare professional's instructions.

## 2015-05-31 NOTE — Progress Notes (Signed)
URGENT  CARE  PROGRESS NOTE     Patient: Wendy [name]   Date: 05/31/2015   MRN: [id number]       LAWRENCE ROLDAN is a 60 y.o. female      SUBJECTIVE     Chief Complaint   Patient presents with   . Head Injury     She was in the store today around 4 PM and struck in the forehead by something sticking out of a shelf. She denies dizziness, loss of consciousness but did get a HA and soreness on the forehead. She has a very small scratch to the middle of her forehead.         Head Injury   The incident occurred 1 to 3 hours ago. The injury mechanism was a direct blow. There was no loss of consciousness. The volume of blood lost was minimal. The quality of the pain is described as aching and dull. The pain is at a severity of 4/10. The pain is mild. The pain has been constant since the injury. Associated symptoms include blurred vision (due corneal issues in rt eye ) and headaches (mild ). Pertinent negatives include no disorientation. She has tried nothing for the symptoms. The treatment provided no relief.       Review of Systems   Constitutional: Positive for activity change.   HENT: Positive for facial swelling.    Eyes: Positive for blurred vision (due corneal issues in rt eye ).   Respiratory: Negative.    Cardiovascular: Negative.    Gastrointestinal: Negative.    Neurological: Positive for headaches (mild ).   Hematological: Negative.    Psychiatric/Behavioral: Negative.    All other systems reviewed and are negative.      The following portions of the patient's history were reviewed and updated as appropriate: Allergies, Current Medications, Past Family History, Past Medical history, Past social history, Past surgical history, and Problem List.    OBJECTIVE     Vitals   Filed Vitals:    05/31/15 2008   BP: 153/93   Pulse: 74   Temp: 98 F (36.7 C)   TempSrc: Oral   Resp: 20       Physical Exam   Constitutional: Vital signs are normal. She appears well-developed and well-nourished. She is active. No  distress.   HENT:   Head: Normocephalic.       Eyes: Conjunctivae, EOM and lids are normal. Pupils are equal, round, and reactive to light. Lids are everted and swept, no foreign bodies found.   Neck: Normal range of motion.   Cardiovascular: Normal rate and regular rhythm.    Pulmonary/Chest: Effort normal and breath sounds normal.   Abdominal: Soft. Normal appearance and bowel sounds are normal.   Neurological: She is alert. She has normal strength. No cranial nerve deficit or sensory deficit. She displays a negative Romberg sign. GCS eye subscore is 4. GCS verbal subscore is 5. GCS motor subscore is 6.   Reflex Scores:       Bicep reflexes are 2+ on the right side and 2+ on the left side.       Patellar reflexes are 2+ on the right side and 2+ on the left side.  Skin: Skin is warm and dry.   Psychiatric: She has a normal mood and affect. Her speech is normal and behavior is normal.       Lab Results (24 Hour)   Results     ** No results found  for the last 24 hours. **          Radiology Results (24 Hour)     ** No results found for the last 24 hours. **          ASSESSMENT     1. Contusion of forehead, initial encounter            PLAN     Procedures    There are no diagnoses linked to this encounter.    An After Visit Summary was printed and given to the patient.   Medicines as prescribed .    Follow-up w/ PMD   See AVS     Signed,  Blanchie Dessert, NP  05/31/2015

## 2015-06-16 ENCOUNTER — Ambulatory Visit: Payer: Self-pay | Admitting: Ophthalmology

## 2015-08-10 ENCOUNTER — Ambulatory Visit: Payer: No Typology Code available for payment source | Admitting: Ophthalmology

## 2015-08-17 ENCOUNTER — Ambulatory Visit: Payer: No Typology Code available for payment source | Admitting: Ophthalmology

## 2015-08-24 ENCOUNTER — Ambulatory Visit (INDEPENDENT_AMBULATORY_CARE_PROVIDER_SITE_OTHER): Payer: No Typology Code available for payment source | Admitting: Ophthalmology

## 2015-08-24 DIAGNOSIS — H1851 Endothelial corneal dystrophy: Secondary | ICD-10-CM

## 2015-08-24 DIAGNOSIS — Z961 Presence of intraocular lens: Secondary | ICD-10-CM

## 2015-08-24 DIAGNOSIS — H18519 Endothelial corneal dystrophy, unspecified eye: Secondary | ICD-10-CM

## 2015-08-24 NOTE — Progress Notes (Signed)
Agree with assessment and plan below.

## 2015-08-24 NOTE — Progress Notes (Signed)
1. S/p DSEK OS 07/2011  - cornea clear, AC quiet, IOP WNL   - continue Durezol daily as usual (has prev tried to wean off of it, but had a flare of rejection), IOP tolerating it today    2. Blepharitis/DES OS  - warm compresses 15 min bid, pt educated on technique  - recommended lid scrubs   - PFAT 6/day, more PRN  - AT ung QHS if can tolerate   - omega-3, flax seed    3. Fuchs OD  - Stable, monitor    4. Pseudophakia OS   - stable, well-centered  - monovision (near OS)    5. Refractive error  - stable, monitor  - monovision OS with CEIOL    6. ERM OS with mild thickening   - has seen retina, no plans for intervention    7. Nuclear sclerosis OD   - not visually significant      8. PVD OD  - seen by retina, with no rt/rd.  - RD precautions discussed    rtc in 4-6 months for cornea and IOP check and DFE OU

## 2015-09-07 ENCOUNTER — Ambulatory Visit (INDEPENDENT_AMBULATORY_CARE_PROVIDER_SITE_OTHER): Payer: No Typology Code available for payment source | Admitting: Foot & Ankle Surgery

## 2015-09-07 ENCOUNTER — Encounter (INDEPENDENT_AMBULATORY_CARE_PROVIDER_SITE_OTHER): Payer: Self-pay | Admitting: Foot & Ankle Surgery

## 2015-09-07 VITALS — BP 137/81 | HR 83 | Ht 64.0 in | Wt 170.0 lb

## 2015-09-07 DIAGNOSIS — L600 Ingrowing nail: Secondary | ICD-10-CM | POA: Insufficient documentation

## 2015-09-07 DIAGNOSIS — L851 Acquired keratosis [keratoderma] palmaris et plantaris: Secondary | ICD-10-CM | POA: Insufficient documentation

## 2015-09-07 NOTE — Progress Notes (Signed)
This is a 60 year old female.  She is a new patient.  She is here with 2  chief complaints.  The first is ingrown toenail on her left great toe along  the inside border.  She thinks the last time it was  painful was about 4  years ago.  It has been a little bit irritated.  It especially hurts at  night when the sheets hit it.  She denies redness, swelling, drainage, or  signs of infection.  She has not done anything to treat it.     She also has a thick area of skin on the plantar aspect of her right foot.   She thinks she had a "tumor" removed from that area, which was benign, with  surgery many years ago and she feels like she gets thickened callus over  that area.  She is not exactly sure what it is, but it is sore and painful  to walk on.  Denies redness, swelling, drainage, or open wounds.  All other  systems reviewed and are negative.     On exam, she is awake and alert, oriented, no acute distress,  well-developed, well-nourished female.    LOWER EXTREMITY EXAM:  She has palpable pedal pulses.  Capillary fill time  is immediate to the feet.  No ecchymosis.  No edema or erythema.  Her left  hallux medial nail border is tender with palpation; however, there are no  signs of infection and no drainage.  Her nail is incurvated along the  border.  On the plantar aspect of the right foot along the ball of the  foot, she does have a large callus.  Post-debridement, this appears to be  secondary to just some scar tissue in the area.  No evidence of a plantar  wart.  No wound or skin breakdown.  Her sensation is intact to light touch.   No gross deformities.  Muscle strength is 5/5 and symmetric.     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     PLAN:  For the nail, we discussed treatment options.  She is going to a  graduation, so she preferred to just have the nail trimmed back a little  bit, which was done  for her today to relieve pressure and pain.  She will  do warm water soaks with Epsom salts and keep the area clean and apply some  antibiotic ointment.  If the problem persists, she will return for nail  removal with phenol matrixectomy.     A #10 scalpel was used to extensively debride and remove a large thickened,  calloused skin from the plantar aspect of the right foot.  She had  immediate pain relief post-debridement.  She will follow up with me p.r.n.

## 2015-10-29 ENCOUNTER — Ambulatory Visit (INDEPENDENT_AMBULATORY_CARE_PROVIDER_SITE_OTHER): Payer: No Typology Code available for payment source | Admitting: Family Medicine

## 2015-10-29 ENCOUNTER — Encounter (INDEPENDENT_AMBULATORY_CARE_PROVIDER_SITE_OTHER): Payer: Self-pay

## 2015-10-29 VITALS — BP 131/92 | HR 93 | Temp 98.5°F | Resp 19 | Ht 64.0 in | Wt 175.0 lb

## 2015-10-29 DIAGNOSIS — J40 Bronchitis, not specified as acute or chronic: Secondary | ICD-10-CM

## 2015-10-29 DIAGNOSIS — R062 Wheezing: Secondary | ICD-10-CM

## 2015-10-29 MED ORDER — ALBUTEROL SULFATE (2.5 MG/3ML) 0.083% IN NEBU
2.5000 mg | INHALATION_SOLUTION | Freq: Once | RESPIRATORY_TRACT | Status: AC
Start: 2015-10-29 — End: 2015-10-29
  Administered 2015-10-29: 2.5 mg via RESPIRATORY_TRACT

## 2015-10-29 MED ORDER — GUAIFENESIN-CODEINE 100-10 MG/5ML PO SOLN
5.0000 mL | Freq: Every evening | ORAL | Status: DC | PRN
Start: 2015-10-29 — End: 2015-10-29

## 2015-10-29 MED ORDER — ALBUTEROL SULFATE HFA 108 (90 BASE) MCG/ACT IN AERS
2.0000 | INHALATION_SPRAY | RESPIRATORY_TRACT | Status: DC | PRN
Start: 2015-10-29 — End: 2015-11-26

## 2015-10-29 MED ORDER — GUAIFENESIN-CODEINE 100-10 MG/5ML PO SOLN
5.0000 mL | Freq: Every evening | ORAL | Status: AC | PRN
Start: 2015-10-29 — End: 2015-11-08

## 2015-10-29 NOTE — Progress Notes (Signed)
[facility]  [facility]  PROGRESS NOTE     Patient: Wendy [name]   Date: 10/29/2015   MRN: [id number]       Wendy [name] is a 60 y.o. female      SUBJECTIVE     Chief Complaint   Patient presents with   . URI     Patient complaning of cold like symptoms. Coughing cant sleep at night. Started around 1 week ago. Chest cold. Stated bug bites under arm    . Insect Bite     Stated bug bites under arm. No itchy. Some redness. Noticed it on 10/24/2015.          URI   This is a new problem. Episode onset: 1 wks. The problem has been unchanged. There has been no fever. Associated symptoms include congestion, coughing (dry cough), a rash (area of insect bite on right side axilla) and wheezing. Pertinent negatives include no abdominal pain, chest pain, diarrhea, ear pain, headaches, nausea, neck pain, rhinorrhea, sinus pain, sneezing, sore throat, swollen glands or vomiting. She has tried nothing for the symptoms.   Insect Bite  Allergic Reaction: Associated symptoms include coughing (dry cough), a rash (area of insect bite on right side axilla) and wheezing. Pertinent negatives include no abdominal pain, chest pain, diarrhea, trouble swallowing or vomiting.   Associated symptoms: rash (area of insect bite on right side axilla)    Associated symptoms: no fever    Patient also notes a few bug bites - one on her leg - improved. Then a few under her right arm/axillary area. States used antiseptic which helped. No pain or itchiness.  Coughing symptoms began after return from vacation - was on a cruise to [place]. Not sick while on trip.  No fevers  H/o bronchitis in past - has used inhaler in distant past.  Also reports wheezing mostly at night - not able to sleep.    Review of Systems   Constitutional: Negative for fever, chills and diaphoresis.   HENT: Positive for congestion and postnasal drip. Negative for ear pain, hearing loss, rhinorrhea, sinus pressure, sneezing, sore throat and trouble swallowing.    Eyes: Negative for  discharge.   Respiratory: Positive for cough (dry cough) and wheezing. Negative for chest tightness and shortness of breath.    Cardiovascular: Negative for chest pain.   Gastrointestinal: Negative for nausea, vomiting, abdominal pain and diarrhea.   Musculoskeletal: Negative for myalgias, arthralgias, neck pain and neck stiffness.   Skin: Positive for rash (area of insect bite on right side axilla). Negative for color change.   Allergic/Immunologic: Negative for environmental allergies.   Neurological: Negative for dizziness and headaches.   Hematological: Negative for adenopathy.   Psychiatric/Behavioral: Negative for confusion and sleep disturbance.       The following portions of the patient's history were reviewed and updated as appropriate: Allergies, Current Medications, Past Family History, Past Medical history, Past social history, Past surgical history, and Problem List.    OBJECTIVE     Vitals   Filed Vitals:    10/29/15 0921   BP: 131/92   Pulse: 93   Temp: 98.5 F (36.9 C)   TempSrc: Oral   Resp: 19   Height: 1.626 m (5\' 4" )   Weight: 79.379 kg (175 lb)   SpO2: 98%       Physical Exam   Nursing note and vitals reviewed.  Constitutional: She is oriented to person, place, and time. She appears well-developed and well-nourished. No  distress.   HENT:   Head: Normocephalic and atraumatic.   Right Ear: Tympanic membrane and external ear normal.   Left Ear: Tympanic membrane and external ear normal.   Mouth/Throat: Oropharynx is clear and moist. No oropharyngeal exudate.   Eyes: Conjunctivae are normal. Right conjunctiva is not injected. Left conjunctiva is not injected. No scleral icterus.   Neck: Normal range of motion. Neck supple.   Cardiovascular: Normal rate, regular rhythm and normal heart sounds.    No murmur heard.  Pulmonary/Chest: Effort normal and breath sounds normal. No respiratory distress. She has no wheezes.   Lymphadenopathy:     She has no cervical adenopathy.   Neurological: She is alert  and oriented to person, place, and time.   Skin: Skin is warm and dry. Rash (papules x 4 noted under right axillae, no drainage or erythema. ) noted.   Psychiatric: She has a normal mood and affect.       Lab Results (24 Hour)   Results     ** No results found for the last 24 hours. **          Radiology Results (24 Hour)     ** No results found for the last 24 hours. **        Albuterol neb given  Increased air exchange after neb, no wheezing.    ASSESSMENT     Encounter Diagnoses   Name Primary?   . Bronchitis Yes   . Wheezing           PLAN     Procedures    1. Bronchitis  - albuterol (PROVENTIL) nebulizer solution 2.5 mg; Take 3 mLs (2.5 mg total) by nebulization one time.      - Task for Inhalation Treatment  - albuterol (PROAIR HFA) 108 (90 Base) MCG/ACT inhaler; Inhale 2 puffs into the lungs every 4 (four) hours as needed for Wheezing or Shortness of Breath.  Dispense: 1 Inhaler; Refill: 0  - guaiFENesin-codeine (ROBITUSSIN W CODEINE) 100-10 MG/5ML syrup; Take 5 mLs by mouth nightly as needed for Cough.  Dispense: 120 mL; Refill: 0    2. Wheezing  - albuterol (PROVENTIL) nebulizer solution 2.5 mg; Take 3 mLs (2.5 mg total) by nebulization one time.      - Task for Inhalation Treatment  - albuterol (PROAIR HFA) 108 (90 Base) MCG/ACT inhaler; Inhale 2 puffs into the lungs every 4 (four) hours as needed for Wheezing or Shortness of Breath.  Dispense: 1 Inhaler; Refill: 0    Rash - likely resolved bug bites.    An After Visit Summary was printed and given to the patient.    Follow up with your Primary Care Physician or Return to Clinic if symptoms persist or worsen. Patient/Family verbalizes understanding.    Signed,  Cinda Quest, MD  10/29/2015

## 2015-10-29 NOTE — Patient Instructions (Signed)
Use albuterol inhaler 2 puffs every 4-6 hours for first three days then as needed.  Robitussin with codeine at bedtime        Bronchitis with Wheezing (Viral or Bacterial: Adult)    Bronchitis is an infection of the air passages. It often occurs during a cold and is usually caused by a virus. Symptoms include cough with mucus (phlegm) and low-grade fever. This illness is contagious during the first few days and is spread through the air by coughing and sneezing, or by direct contact (touching the sick person and then touching your own eyes, nose, or mouth).  If there is a lot of inflammation, air flow is restricted. The air passages may also go into spasm, especially if you have asthma. This causes wheezing and difficulty breathing even in people who do not have asthma.  Bronchitis usually lasts 7 to 14 days. The wheezing should improve with treatment during the first week. An inhaler is often prescribed to relax the air passages and stop wheezing. Antibiotics will be prescribed if your doctor thinks there is also a secondary bacterial infection.  Home care   If symptoms are severe, rest at home for the first 2 to 3 days. When you go back to your usual activities, don't let yourself get too tired.   Do not smoke. Also avoid being exposed to secondhand smoke.   You may use over-the-counter medicine to control fever or pain, unless another medicine was prescribed. Note: If you have chronic liver or kidney disease or have ever had a stomach ulcer or gastrointestinal bleeding, talk with your healthcare provider before using these medicines. Also talk to your provider if you are taking medicine to prevent blood clots.) Aspirin should never be given to anyone younger than 60 years of age who is ill with a viral infection or fever. It may cause severe liver or brain damage.   Your appetite may be poor, so a light diet is fine. Avoid dehydration by drinking 6 to 8 glasses of fluids per day (such as water, soft drinks,  sports drinks, juices, tea, or soup). Extra fluids will help loosen secretions in the nose and lungs.   Over-the-counter cough, cold, and sore-throat medicines will not shorten the length of the illness, but they may be helpful to reduce symptoms. (Note: Do not use decongestants if you have high blood pressure.)   If you were given an inhaler, use it exactly as directed. If you need to use it more often than prescribed, your condition may be worsening. If this happens, contact your healthcare provider.   If prescribed, finish all antibiotic medicine, even if you are feeling better after only a few days.  Follow-up care  Follow up with your healthcare provider, or as advised. If you had an X-ray or ECG (electrocardiogram), a specialist will review it. You will be notified of any new findings that may affect your care.  Note: If you are age 36 or older, or if you have a chronic lung disease or condition that affects your immune system, or you smoke, talk to your healthcare provider about having a pneumococcal vaccinations and a yearly influenza vaccination (flu shot).  When to seek medical advice  Call your healthcare provider right away if any of these occur:   Fever of 100.75F (38C) or higher   Coughing up increasing amounts of colored sputum   Weakness, drowsiness, headache, facial pain, ear pain, or a stiff neck  Call 911, or get immediate medical care  Contact emergency services right away if any of these occur.   Coughing up blood   Worsening weakness, drowsiness, headache, or stiff neck   Increased wheezing not helped with medication, shortness of breath, or pain with breathing  Date Last Reviewed: 01/04/2014   2000-2016 [employer] [address], [place], [place] [address]. All rights reserved. This information is not intended as a substitute for professional medical care. Always follow your healthcare professional's instructions.

## 2015-11-07 ENCOUNTER — Encounter (INDEPENDENT_AMBULATORY_CARE_PROVIDER_SITE_OTHER): Payer: Self-pay

## 2015-11-07 ENCOUNTER — Ambulatory Visit (INDEPENDENT_AMBULATORY_CARE_PROVIDER_SITE_OTHER): Payer: No Typology Code available for payment source | Admitting: Adult Health

## 2015-11-07 ENCOUNTER — Ambulatory Visit (INDEPENDENT_AMBULATORY_CARE_PROVIDER_SITE_OTHER): Payer: No Typology Code available for payment source

## 2015-11-07 VITALS — BP 133/83 | HR 81 | Temp 98.4°F | Resp 16 | Ht 64.0 in | Wt 172.0 lb

## 2015-11-07 DIAGNOSIS — R05 Cough: Secondary | ICD-10-CM

## 2015-11-07 DIAGNOSIS — R059 Cough, unspecified: Secondary | ICD-10-CM

## 2015-11-07 MED ORDER — BUDESONIDE-FORMOTEROL FUMARATE 160-4.5 MCG/ACT IN AERO
2.0000 | INHALATION_SPRAY | Freq: Two times a day (BID) | RESPIRATORY_TRACT | Status: DC
Start: 2015-11-07 — End: 2016-01-07

## 2015-11-07 MED ORDER — AZITHROMYCIN 250 MG PO TABS
250.0000 mg | ORAL_TABLET | Freq: Every day | ORAL | Status: DC
Start: 2015-11-07 — End: 2015-11-26

## 2015-11-07 MED ORDER — ALBUTEROL-IPRATROPIUM 2.5-0.5 (3) MG/3ML IN SOLN
3.0000 mL | Freq: Once | RESPIRATORY_TRACT | Status: AC
Start: 2015-11-07 — End: 2015-11-07
  Administered 2015-11-07: 3 mL via RESPIRATORY_TRACT

## 2015-11-07 NOTE — Progress Notes (Signed)
[facility]  PROGRESS NOTE     Patient: Wendy [name]   Date: 11/08/2015   MRN: [id number]       Wendy [name] is a 60 y.o. female      SUBJECTIVE     Chief Complaint   Patient presents with   . Cough     cough,sinus congestion,bad headache,chest congestion,wheezing,phlegym. it started 2 weeks ago.         Cough  This is a new problem. The current episode started 1 to 4 weeks ago. The problem has been rapidly worsening. The problem occurs constantly. The cough is productive of purulent sputum. Associated symptoms include chills, ear pain, headaches, myalgias, nasal congestion, postnasal drip, rhinorrhea, a sore throat, sweats and wheezing. She has tried OTC inhaler and prescription cough suppressant for the symptoms. The treatment provided no relief. Her past medical history is significant for bronchitis.       Review of Systems   Constitutional: Positive for chills, activity change and fatigue.   HENT: Positive for congestion, ear pain, postnasal drip, rhinorrhea, sinus pressure and sore throat.    Eyes: Negative.    Respiratory: Positive for cough and wheezing.    Cardiovascular: Negative.    Genitourinary: Negative.    Musculoskeletal: Positive for myalgias.   Skin: Negative.    Allergic/Immunologic: Negative.    Neurological: Positive for headaches.   All other systems reviewed and are negative.      The following portions of the patient's history were reviewed and updated as appropriate: Allergies, Current Medications, Past Family History, Past Medical history, Past social history, Past surgical history, and Problem List.    OBJECTIVE     Vitals   Filed Vitals:    11/07/15 1452   BP: 133/83   Pulse: 81   Temp: 98.4 F (36.9 C)   TempSrc: Tympanic   Resp: 16   Height: 1.626 m (5\' 4" )   Weight: 78.019 kg (172 lb)   SpO2: 98%       Physical Exam   Constitutional: Vital signs are normal. She appears well-developed and well-nourished. She is active.   HENT:   Head: Normocephalic.   Right Ear: Tympanic  membrane is bulging. Tympanic membrane is not erythematous.   Left Ear: Tympanic membrane is erythematous and bulging.   Nose: Mucosal edema and rhinorrhea present.   Mouth/Throat: Uvula is midline. Posterior oropharyngeal edema and posterior oropharyngeal erythema present.   Eyes: Pupils are equal, round, and reactive to light.   Cardiovascular: Normal rate and regular rhythm.    Pulmonary/Chest: Effort normal. She has decreased breath sounds in the right middle field, the right lower field, the left middle field and the left lower field. She has wheezes in the right middle field, the right lower field, the left middle field and the left lower field.   Lymphadenopathy:        Head (right side): No submandibular, no tonsillar and no preauricular adenopathy present.        Head (left side): No submandibular, no tonsillar and no preauricular adenopathy present.   Neurological: She is alert.   Skin: Skin is warm, dry and intact.   Psychiatric: She has a normal mood and affect. Her speech is normal and behavior is normal.       Lab Results (24 Hour)   Results     ** No results found for the last 24 hours. **          Radiology Results (24  Hour)     ** No results found for the last 24 hours. **      Duo Neb given - Increased BS , decreased wheezing   Independently reviewed xray, agree with xray reading     ASSESSMENT     1. Cough  X-ray chest PA and lateral    albuterol-ipratropium (DUO-NEB) 2.5-0.5(3) mg/3 mL nebulizer 3 mL    azithromycin (ZITHROMAX) 250 MG tablet    budesonide-formoterol (SYMBICORT) 160-4.5 MCG/ACT inhaler          PLAN     Procedures    1. Cough  - X-ray chest PA and lateral  - albuterol-ipratropium (DUO-NEB) 2.5-0.5(3) mg/3 mL nebulizer 3 mL; Take 3 mLs by nebulization one time.      - azithromycin (ZITHROMAX) 250 MG tablet; Take 1 tablet (250 mg total) by mouth daily.  Dispense: 6 tablet; Refill: 0  - budesonide-formoterol (SYMBICORT) 160-4.5 MCG/ACT inhaler; Inhale 2 puffs into the lungs 2 (two)  times daily.  Dispense: 1 Inhaler; Refill: 5    An After Visit Summary was printed and given to the patient.   Medicines as prescribed .    Follow-up w/ PMD   See AVS     Signed,  Blanchie Dessert, NP  11/08/2015

## 2015-11-07 NOTE — Patient Instructions (Signed)
Bronchitis with Wheezing (Viral or Bacterial: Adult)    Bronchitis is an infection of the air passages. It often occurs during a cold and is usually caused by a virus. Symptoms include cough with mucus (phlegm) and low-grade fever. This illness is contagious during the first few days and is spread through the air by coughing and sneezing, or by direct contact (touching the sick person and then touching your own eyes, nose, or mouth).  If there is a lot of inflammation, air flow is restricted. The air passages may also go into spasm, especially if you have asthma. This causes wheezing and difficulty breathing even in people who do not have asthma.  Bronchitis usually lasts 7 to 14 days. The wheezing should improve with treatment during the first week. An inhaler is often prescribed to relax the air passages and stop wheezing. Antibiotics will be prescribed if your doctor thinks there is also a secondary bacterial infection.  Home care   If symptoms are severe, rest at home for the first 2 to 3 days. When you go back to your usual activities, don't let yourself get too tired.   Do not smoke. Also avoid being exposed to secondhand smoke.   You may use over-the-counter medicine to control fever or pain, unless another medicine was prescribed. Note: If you have chronic liver or kidney disease or have ever had a stomach ulcer or gastrointestinal bleeding, talk with your healthcare provider before using these medicines. Also talk to your provider if you are taking medicine to prevent blood clots.) Aspirin should never be given to anyone younger than 60 years of age who is ill with a viral infection or fever. It may cause severe liver or brain damage.   Your appetite may be poor, so a light diet is fine. Avoid dehydration by drinking 6 to 8 glasses of fluids per day (such as water, soft drinks, sports drinks, juices, tea, or soup). Extra fluids will help loosen secretions in the nose and lungs.   Over-the-counter  cough, cold, and sore-throat medicines will not shorten the length of the illness, but they may be helpful to reduce symptoms. (Note: Do not use decongestants if you have high blood pressure.)   If you were given an inhaler, use it exactly as directed. If you need to use it more often than prescribed, your condition may be worsening. If this happens, contact your healthcare provider.   If prescribed, finish all antibiotic medicine, even if you are feeling better after only a few days.  Follow-up care  Follow up with your healthcare provider, or as advised. If you had an X-ray or ECG (electrocardiogram), a specialist will review it. You will be notified of any new findings that may affect your care.  Note: If you are age 65 or older, or if you have a chronic lung disease or condition that affects your immune system, or you smoke, talk to your healthcare provider about having a pneumococcal vaccinations and a yearly influenza vaccination (flu shot).  When to seek medical advice  Call your healthcare provider right away if any of these occur:   Fever of 100.4F (38C) or higher   Coughing up increasing amounts of colored sputum   Weakness, drowsiness, headache, facial pain, ear pain, or a stiff neck  Call 911, or get immediate medical care  Contact emergency services right away if any of these occur.   Coughing up blood   Worsening weakness, drowsiness, headache, or stiff neck   Increased wheezing   not helped with medication, shortness of breath, or pain with breathing  Date Last Reviewed: 01/04/2014   2000-2016 [employer] [address], [place] [address]. All rights reserved. This information is not intended as a substitute for professional medical care. Always follow your healthcare professional's instructions.

## 2015-11-23 ENCOUNTER — Ambulatory Visit (INDEPENDENT_AMBULATORY_CARE_PROVIDER_SITE_OTHER)
Payer: No Typology Code available for payment source | Admitting: Student in an Organized Health Care Education/Training Program

## 2015-11-26 ENCOUNTER — Encounter (INDEPENDENT_AMBULATORY_CARE_PROVIDER_SITE_OTHER): Payer: Self-pay | Admitting: Student in an Organized Health Care Education/Training Program

## 2015-11-26 ENCOUNTER — Ambulatory Visit (INDEPENDENT_AMBULATORY_CARE_PROVIDER_SITE_OTHER)
Payer: No Typology Code available for payment source | Admitting: Student in an Organized Health Care Education/Training Program

## 2015-11-26 VITALS — BP 125/76 | HR 73 | Temp 97.6°F | Ht 64.0 in | Wt 178.8 lb

## 2015-11-26 DIAGNOSIS — L03019 Cellulitis of unspecified finger: Secondary | ICD-10-CM

## 2015-11-26 DIAGNOSIS — H60541 Acute eczematoid otitis externa, right ear: Secondary | ICD-10-CM

## 2015-11-26 DIAGNOSIS — G43709 Chronic migraine without aura, not intractable, without status migrainosus: Secondary | ICD-10-CM

## 2015-11-26 DIAGNOSIS — IMO0002 Reserved for concepts with insufficient information to code with codable children: Secondary | ICD-10-CM

## 2015-11-26 DIAGNOSIS — E782 Mixed hyperlipidemia: Secondary | ICD-10-CM

## 2015-11-26 MED ORDER — FLUOCINOLONE ACETONIDE 0.01 % OT OIL
TOPICAL_OIL | OTIC | Status: DC
Start: 2015-11-26 — End: 2016-03-30

## 2015-11-26 MED ORDER — ATORVASTATIN CALCIUM 20 MG PO TABS
20.0000 mg | ORAL_TABLET | Freq: Every day | ORAL | Status: DC
Start: 2015-11-26 — End: 2016-01-07

## 2015-11-26 MED ORDER — TRIAMCINOLONE ACETONIDE 0.1 % EX CREA
TOPICAL_CREAM | Freq: Two times a day (BID) | CUTANEOUS | Status: DC
Start: 2015-11-26 — End: 2016-03-30

## 2015-11-26 MED ORDER — MUPIROCIN 2 % EX OINT
TOPICAL_OINTMENT | Freq: Two times a day (BID) | CUTANEOUS | Status: DC
Start: 2015-11-26 — End: 2016-03-30

## 2015-11-26 MED ORDER — SIMVASTATIN 20 MG PO TABS
40.0000 mg | ORAL_TABLET | Freq: Every evening | ORAL | Status: DC
Start: 2015-11-26 — End: 2016-01-07

## 2015-11-26 NOTE — Progress Notes (Signed)
Have you seen any new outside specialist? "Yes" ER respiratory prob, pt aware due for pap test and colono

## 2015-11-26 NOTE — Patient Instructions (Addendum)
Pneumococcal Vaccination  There are 2pneumococcal vaccinations thatprotect people against pneumococcal disease.  Pneumococcal disease  Pneumococcal disease is caused by bacteria (Streptococcus pneumoniae). This germ is easily spread when someone with the bacteria coughs, sneezes, laughs, or talks.You can get pneumococcal disease more than once. This is because there are many different types (strains) of the bacteria. Some strains are also resistant to treatment with antibiotics.  There are different kinds of pneumococcal disease, depending on what part of the body is infected. They include:   Pneumonia. Infection in the lungs.   Meningitis. Infection ofthe covering ofthe brain and spinal cord.   Otitis media. Infection of themiddle ear.   Bacteremia or septicemia. Infection in the blood.  Pneumococcal disease can be life-threatening, especially for people in high-risk groups. Each year, thousands of people die of this disease. Thousands more become seriously ill.  The vaccine    The pneumococcal vaccinesare the best way to avoid pneumococcal disease. They are safe and effective. The vaccines are given as shots (injections). This can be done at your healthcare provider's office or a health clinic. Drugstores, senior centers, and workplaces often offer vaccinations, too. If you have questions, ask your healthcare provider.  The pneumococcal vaccines are recommended for:   Persons65 and older   Infants   People with chronic health problems (such as diabetes, chronic lung or heart disease, liver disease)   People who have a cochlear implant   People who have weakened immune systems   People who live in nursing homes or other long-term care facilities   People who smoke or have asthma  They aregiven:   In a 4-dose series in infants   One time in adults; some people need a second dose of one of the vaccines  Your healthcare provider can tell you more about the vaccines,whether you should get them,  and the number of shots you should get.  Date Last Reviewed: 02/23/2015   2000-2016 [employer] [address] [place], [place] [address]. All rights reserved. This information is not intended as a substitute for professional medical care. Always follow your healthcare professional's instructions.      Co-Enzyme Q-10 Oral capsule  What is this medicine?  CO-ENZYME Q10 (koh EN zahym Q10) is an herbal or dietary supplement. It is promoted to help many disorders including congestive heart failure, certain mitochondrial diseases, migraine, and high blood pressure. The FDA has not approved this supplement for any medical use.  This supplement may be used for other purposes; ask your health care provider or pharmacist if you have questions.  What should I tell my health care provider before I take this medicine?  They need to know if you have any of these conditions:   an unusual or allergic reaction to co-enzyme Q10, other herbs, plants, medicines, foods, dyes, or preservatives   pregnant or trying to get pregnant   breast-feeding  How should I use this medicine?  Take this medicine by mouth with a glass of water. Follow the directions on the package labeling, or take as directed by your health care professional. You should take this medicine with a high-fat meal. Do not take this medicine more often than directed.  Contact your pediatrician regarding the use of this medicine in children. Special care may be needed.  Overdosage: If you think you've taken too much of this medicine contact a poison control center or emergency room at once.  NOTE: This medicine is only for you. Do not share  this medicine with others.  What if I miss a dose?  If you miss a dose, take it as soon as you can. If it is almost time for your next dose, take only that dose. Do not take double or extra doses.  What may interact with this medicine?   medicines for blood pressure   warfarin  This list may not describe all possible  interactions. Give your health care provider a list of all the medicines, herbs, non-prescription drugs, or dietary supplements you use. Also tell them if you smoke, drink alcohol, or use illegal drugs. Some items may interact with your medicine.  What should I watch for while using this medicine?  See your doctor if your symptoms do not get better or if they get worse.  Herbal or dietary supplements are not regulated like medicines. Rigid quality control standards are not required for dietary supplements. The purity and strength of these products can vary. The safety and effect of this dietary supplement for a certain disease or illness is not well known. This product is not intended to diagnose, treat, cure or prevent any disease.  The Food and Drug Administration suggests the following to help consumers protect themselves:   Always read product labels and follow directions.   Natural does not mean a product is safe for humans to take.   Look for products that include USP after the ingredient name. This means that the manufacturer followed the standards of the [place] Pharmacopoeia.   Supplements made or sold by a nationally known food or drug company are more likely to be made under tight controls. You can write to the company for more information about how the product was made.  What side effects may I notice from receiving this medicine?  Side effects that you should report to your doctor or health care professional as soon as possible:   allergic reactions like skin rash, itching or hives, swelling of the face, lips, or tongue  Side effects that usually do not require medical attention (Report these to your doctor or health care professional if they continue or are bothersome.):   diarrhea   loss of appetite   nausea   trouble sleeping   upset stomach  This list may not describe all possible side effects. Call your doctor for medical advice about side effects. You may report side effects to FDA at  1-800-FDA-1088.  Where should I keep my medicine?  Keep out of the reach of children.  Store at room temperature or as directed on the package label. Throw away any unused medicine after the expiration date.  NOTE:This sheet is a summary. It may not cover all possible information. If you have questions about this medicine, talk to your doctor, pharmacist, or health care provider. Copyright 2015 [employer]      [employer]  [facility] successfully treats more patients using the [employer] weight loss system than any other practice! Let us Help You Take The First Step - Call [phone]!

## 2015-11-26 NOTE — Progress Notes (Signed)
she   Chief Complaint   Patient presents with   . New pt   HA  Cough         Reports Headache for which she describes  It as pressure  , Meningioma surgery in 2009 anD HA started then , not responsive to topamax 50 she is taking now, causing abd wall twitches , Not  associated with Nausea or vomiting,  No triggering  Agent, not associate dwith photophobia or phonophobia .    she denies any ear pain, discharge, ringing sensation or hearing loss  no recent URTI, no diplopia, no blurred vision, no altered speech, no paresthesia, weakness, Tremors or shakiness, no unsteady gait no chest pain, palpitations, DOE.  No  Hx recent head injury.  The BP today is normal, No similar episode in th epast, No Hx migraine   She has chronic L blepharitis , seeing ophto   Sees persona , She gets B12 every 2 weeks , phentermine , weight is unchanged     She hass b/l dry EOC , using triamcinolone      Hypertension: occasionally compliant    - The patient feels this issue is  controlled.  - The patient has been monitoring blood pressure.  - The patient has  been adhering to the recommended no-added-salt diet.  - The patient has not been compliant to the recommended exercise program.  - The patient is  tolerating the current medication regimen well.  - The patient denies orthostasis.  - The patient denies chest pain.  - The patient denies dyspnea.  - The patient denies edema.    Hx HLD  Compliant with cholesterol medication Zocor 40 but thinks it causing neck pain , crestor caused myalgia in the past , doesn't remember why Lipitor was stopped   No Myalgias,  Muscle aches , bodyaches  Compliant with low saturated and low carbohydrate Diet  No hx abnormal liver test related to statin therapy     Previous PCP: MALIK Amtoul     Past Medical History   Diagnosis Date   . Hypertensive disorder    . Hypercholesteremia    . Brain tumor (benign)       Past Surgical History   Procedure Laterality Date   . Brain surgery  2009   . Corneal transplant      . Breast biopsy Right 2008     right breast biopsy- benign   . Breast biopsy Right 1970s     right breast tumor removed 40+ years ago - benign   . Breast cyst excision  1970s     right breast tumor removed 40+ years ago - benign   . Eye surgery          (Not in a hospital admission)  Current/Home Medications    BUDESONIDE-FORMOTEROL (SYMBICORT) 160-4.5 MCG/ACT INHALER    Inhale 2 puffs into the lungs 2 (two) times daily.    DIFLUPREDNATE (DUREZOL) 0.05 % EMULSION OPHTHALMIC SOLUTION    Place 1 drop into the left eye daily.     ESOMEPRAZOLE (NEXIUM) 20 MG CAPSULE    Take 20 mg by mouth every morning before breakfast.    IBUPROFEN (ADVIL,MOTRIN) 600 MG TABLET    Take 1 tablet (600 mg total) by mouth every 6 (six) hours as needed for Pain or Fever.    LISINOPRIL-HYDROCHLOROTHIAZIDE (PRINZIDE,ZESTORETIC) 10-12.5 MG PER TABLET    Take 1 tablet by mouth daily.    MULTIPLE VITAMIN (MULTIVITAMIN) TABLET    Take 1  tablet by mouth daily.    SIMVASTATIN (ZOCOR) 20 MG TABLET    Take 20 mg by mouth nightly.    TOPIRAMATE (TOPAMAX) 50 MG TABLET    Take 50 mg by mouth 2 (two) times daily.     Allergies   Allergen Reactions   . Crestor [Rosuvastatin]    . Iodine    . Penicillins    . Tetanus Immune Globulin    . Zocor [Simvastatin]       Social History   Substance Use Topics   . Smoking status: Never Smoker    . Smokeless tobacco: Not on file   . Alcohol Use: No      Family History   Problem Relation Age of Onset   . Breast cancer Neg Hx    . Diabetes Mother           Review of Systems - General ROS: negative for - chills, fatigue, fever or malaise  Ophthalmic ROS: negative for - blurry vision, double vision, loss of vision or scotomata  ENT ROS: positive for - headaches and visual changes   Respiratory ROS: no cough, shortness of breath, or wheezing  Cardiovascular ROS: no chest pain or dyspnea on exertion  negative for - edema, irregular heartbeat, palpitations or paroxysmal nocturnal dyspnea  Neurological ROS: no TIA or  stroke symptoms  Other ROS were reviewed and were neg       BP 125/76 mmHg  Pulse 73  Temp(Src) 97.6 F (36.4 C) (Oral)  Ht 1.626 m (5\' 4" )  Wt 81.103 kg (178 lb 12.8 oz)  BMI 30.68 kg/m2  SpO2 98%  Wt Readings from Last 3 Encounters:   11/26/15 81.103 kg (178 lb 12.8 oz)   11/07/15 78.019 kg (172 lb)   10/29/15 79.379 kg (175 lb)         Mental status - alert, oriented to person, place, and time  HEENT: PERRLA; EOMI; Conjunctiva is clear. No nystagmus, visual field normal  Neck - supple, no cervical  Adenopathy, no carotid bruit  Chest - clear to auscultation, no wheezes, rales or rhonchi, symmetric air entry  Heart - normal rate, regular rhythm, normal S1, S2, no murmurs, rubs, clicks or gallops  Abdomen - soft, nontender, nondistended, no masses or organomegaly  Neurological - alert, oriented, normal speech, no focal findings or movement disorder noted, cranial nerves II through XII intact, DTR's normal and symmetric, motor and sensory grossly normal bilaterally, normal finger nose and heel shin test, no tremors  Rombergs negative, sensation to light touch, deep touch, proprioception normal   Musculoskeletal - no joint tenderness, deformity or swelling  Extremities - dorsalis pedis  pulses normal, no pedal edema, no clubbing or cyanosis, +b/l paronychia   Neurological Examination:     Mental Status: Awake, Alert. Oriented x3. Follows commands, has normal fund of knowledge, attention, short term recall, fluency, comprehension and insight.      Gait: walks heel and toe without difficulty, normal tandem gait and Romberg test.     A/P;Headcahe : Kennon Portela Migraine/ tension HA,     1. Eczema of external ear, right  triamcinolone (KENALOG) 0.1 % cream    Fluocinolone Acetonide (DERMOTIC) 0.01 % Oil   2. Mixed hyperlipidemia  atorvastatin (LIPITOR) 20 MG tablet   3. Paronychia, unspecified laterality  mupirocin (BACTROBAN) 2 % ointment     Stop Zocor , start lipitor along with Co q enz 10   Refer to neurology for HA    Decrease Topamax to  25 since 50 caused abd wall twitches   Refer to podiatrist   Risk & Benefits of the new medication(s) were explained to the patient (and family) who verbalized understanding & agreed to the treatment plan. Patient (family) encouraged to contact me/clinical staff with any questions/concerns

## 2016-01-04 ENCOUNTER — Telehealth (INDEPENDENT_AMBULATORY_CARE_PROVIDER_SITE_OTHER): Payer: Self-pay | Admitting: Student in an Organized Health Care Education/Training Program

## 2016-01-04 NOTE — Telephone Encounter (Signed)
Patient is requesting a letter that states she should stop taking all medications prescribed from her weight loss provider because shes experience pain on the side. Her weight loss doctor is the one requesting the letter.         Patient will pick up letter when its ready. [phone]

## 2016-01-04 NOTE — Telephone Encounter (Signed)
This is not a usual scenario. We can discuss during an OV. Have no clue what she is talking about    Please have her f/u    Thanks   [employer] Ketina Mars

## 2016-01-06 NOTE — Telephone Encounter (Signed)
Left a message to have patient call us back and schedule an appointment with Dr. Salina April.

## 2016-01-07 ENCOUNTER — Encounter (INDEPENDENT_AMBULATORY_CARE_PROVIDER_SITE_OTHER): Payer: Self-pay | Admitting: Student in an Organized Health Care Education/Training Program

## 2016-01-07 ENCOUNTER — Ambulatory Visit (FREE_STANDING_LABORATORY_FACILITY)
Payer: No Typology Code available for payment source | Admitting: Student in an Organized Health Care Education/Training Program

## 2016-01-07 VITALS — BP 154/75 | HR 86 | Temp 97.8°F | Resp 19 | Ht 63.5 in | Wt 175.0 lb

## 2016-01-07 DIAGNOSIS — E782 Mixed hyperlipidemia: Secondary | ICD-10-CM

## 2016-01-07 DIAGNOSIS — I10 Essential (primary) hypertension: Secondary | ICD-10-CM

## 2016-01-07 DIAGNOSIS — R1032 Left lower quadrant pain: Secondary | ICD-10-CM

## 2016-01-07 LAB — LIPID PANEL
Cholesterol / HDL Ratio: 4.3
Cholesterol: 198 mg/dL (ref 0–199)
HDL: 46 mg/dL (ref 40–9999)
LDL Calculated: 135 mg/dL — ABNORMAL HIGH (ref 0–99)
Triglycerides: 84 mg/dL (ref 34–149)
VLDL Calculated: 17 mg/dL (ref 10–40)

## 2016-01-07 LAB — HEPATIC FUNCTION PANEL
ALT: 19 U/L (ref 0–55)
AST (SGOT): 22 U/L (ref 5–34)
Albumin/Globulin Ratio: 1.2 (ref 0.9–2.2)
Albumin: 3.9 g/dL (ref 3.5–5.0)
Alkaline Phosphatase: 79 U/L (ref 37–106)
Bilirubin Direct: 0.1 mg/dL (ref 0.0–0.5)
Bilirubin Indirect: 0.1 mg/dL (ref 0.0–1.0)
Bilirubin, Total: 0.2 mg/dL (ref 0.1–1.2)
Globulin: 3.3 g/dL (ref 2.0–3.7)
Protein, Total: 7.2 g/dL (ref 6.0–8.3)

## 2016-01-07 LAB — HEMOLYSIS INDEX: Hemolysis Index: 10 (ref 0–18)

## 2016-01-07 MED ORDER — ATORVASTATIN CALCIUM 20 MG PO TABS
20.0000 mg | ORAL_TABLET | Freq: Every day | ORAL | 5 refills | Status: DC
Start: 2016-01-07 — End: 2016-06-27

## 2016-01-07 NOTE — Progress Notes (Signed)
Have you seen any new specialist/physicians since you were last here?    No       Limb alert protocol reviewed? Yes or No   Yes     Pt declined flu shot

## 2016-01-07 NOTE — Progress Notes (Signed)
Chief Complaint   Patient presents with   . Abdominal Pain     lowe left side x yesterday          Wendy [name] is a 60 y.o. female who presents for evaluation of  Above problem   She C/O Abdominal Pain that started FEW weeks ago ,she has been on topamax /diethyl propion per her weight loss program, she did not respond to the regimen and started having this L sided and pain , her pain has improved after she stopped all herweight loss meds , her [facility] program needs a ,letter stating the reason of stopping those meds a days ago, No recent trip     Hx HLD  Compliant with cholesterol medication lipitor 20  No Myalgias,  Muscle aches , bodyaches  Compliant with low saturated and low carbohydrate Diet  No hx abnormal liver test related to statin therapy           Past Medical History:   Diagnosis Date   . Brain tumor (benign)    . Hypercholesteremia    . Hypertensive disorder       Past Surgical History:   Procedure Laterality Date   . BRAIN SURGERY  2009   . BREAST BIOPSY Right 2008    right breast biopsy- benign   . BREAST BIOPSY Right 1970s    right breast tumor removed 40+ years ago - benign   . BREAST CYST EXCISION  1970s    right breast tumor removed 40+ years ago - benign   . CORNEAL TRANSPLANT     . EYE SURGERY          (Not in a hospital admission)  Current/Home Medications    ATORVASTATIN (LIPITOR) 20 MG TABLET    Take 1 tablet (20 mg total) by mouth daily.    DIETHYLPROPION HCL 25 MG TAB    Take 25 mg by mouth.    DIFLUPREDNATE (DUREZOL) 0.05 % EMULSION OPHTHALMIC SOLUTION    Place 1 drop into the left eye daily.     ESOMEPRAZOLE (NEXIUM) 20 MG CAPSULE    Take 20 mg by mouth every morning before breakfast.    FA-B6-B12-IFC-ALPHA LIPOIC ACD PO    Take by mouth.    FLUOCINOLONE ACETONIDE (DERMOTIC) 0.01 % OIL    4 DROPS ou BID    IBUPROFEN (ADVIL,MOTRIN) 600 MG TABLET    Take 1 tablet (600 mg total) by mouth every 6 (six) hours as needed for Pain or Fever.    LISINOPRIL-HYDROCHLOROTHIAZIDE  (PRINZIDE,ZESTORETIC) 10-12.5 MG PER TABLET    Take 1 tablet by mouth daily.    MULTIPLE VITAMIN (MULTIVITAMIN) TABLET    Take 1 tablet by mouth daily.    MUPIROCIN (BACTROBAN) 2 % OINTMENT    Apply topically 2 (two) times daily.    TRIAMCINOLONE (KENALOG) 0.1 % CREAM    Apply topically 2 (two) times daily.     Allergies   Allergen Reactions   . Crestor [Rosuvastatin]    . Iodine    . Penicillins    . Tetanus Immune Globulin    . Zocor [Simvastatin]       Social History   Substance Use Topics   . Smoking status: Never Smoker   . Smokeless tobacco: Never Used   . Alcohol use No      Family History   Problem Relation Age of Onset   . Diabetes Mother    . Breast cancer Neg Hx  Review of Systems - General ROS: negative for - chills, fatigue, fever or malaise  Hematological and Lymphatic ROS: negative for - fatigue, jaundice or weight loss  Gastrointestinal ROS: positive for - abdominal pain  Genito-Urinary ROS: no dysuria, trouble voiding, or hematuria  Other ROS were reviewed and were neg       Vitals:    01/07/16 1111   BP: 154/75   Pulse: 86   Resp: 19   Temp: 97.8 F (36.6 C)   SpO2: 98%       Physical Examination: General appearance - alert, well appearing, and in no distress and oriented to person, place, and time  Eyes - pupils equal and reactive, extraocular eye movements intact, no icterus  Mouth - mucous membranes moist, pharynx normal without lesions, tonsils normal and no thrush  Chest - clear to auscultation, no wheezes, rales or rhonchi, symmetric air entry  Heart - normal rate, regular rhythm, normal S1, S2, no murmurs, rubs, clicks or gallops  Abdomen - soft, nontender, nondistended, no masses or organomegaly  Extremities - peripheral pulses normal, no pedal edema, no clubbing or cyanosis      A/P     1. Essential hypertension     2. Mixed hyperlipidemia  atorvastatin (LIPITOR) 20 MG tablet    Lipid panel    Hepatic function panel   3. Left lower quadrant pain       A letter was given to Pt    Body mass index is 30.51 kg/m.  HIgh BMI Follow Up   BMI Follow Up Care Plan Documented   Encouragement to Exercise       Risk & Benefits of the  medication(s) were explained to the patient who verbalized understanding & agreed to the treatment plan    Phylliss Bob MD

## 2016-01-10 ENCOUNTER — Ambulatory Visit (INDEPENDENT_AMBULATORY_CARE_PROVIDER_SITE_OTHER): Payer: No Typology Code available for payment source | Admitting: Foot & Ankle Surgery

## 2016-01-10 DIAGNOSIS — L600 Ingrowing nail: Secondary | ICD-10-CM

## 2016-01-10 DIAGNOSIS — L851 Acquired keratosis [keratoderma] palmaris et plantaris: Secondary | ICD-10-CM

## 2016-01-10 NOTE — Progress Notes (Signed)
PODIATRIC SURGERY   PROGRESS NOTE      Patient Name: Wendy [name],Wendy [name]    Assessment:   -Patient is 60 y.o. female with Left hallux medial nail border onychocryptosis.  Also with painful, possible scar tissue to the plantar right foot versus wart versus callus      Plan:   -We discussed procedures to rectify both of her issues causing her significant pain  -She would benefit from a permanent phenol matricectomy of the left hallux medial nail border  -We also discussed surgical excision of the abnormal thickened, painful skin on the plantar aspect of her right foot in the area.  She has had a previous benign tumor removed.  Recommend we send that to pathology for analysis.  -We discussed recovery for both of these procedures.  She will see me back to make an appointment for.  We will have dedicated time to perform both of these procedures here in the office under local anesthesia.  We reviewed risks and benefits  -F/U 1-2 weeks for bilateral foot procedures    Subjective:   Patient presents to clinic For follow-up of an ingrown toenail on her left great toe as well as a painful skin lesion on her plantar right foot.  Ingrown toenail was feeling better when I last trimmed it back, but it seems to be chronic ongoing issue.  She like to discuss permanent removal.   She also continues to have pain.  It because of the thickened skin on the plantar aspect of her right ball of her foot.  6 is 7 years ago.  She had a benign tumor removed from that area and she said thickened abnormal skin in that area over the last few years.  Pain is improved with debridement, but overall the thick callus skin continues to come back time after time.    All other systems were reviewed and are negative  Physical Exam:   There were no vitals filed for this visit.    Gen: AAOx3, NAD    Vascular:  Palpable pedal pulses b/l, CFT to all toes <3 sec. No edema. No varicosities  Derm:  Sub-3rd metatarsal head of the right foot.  There is thickened,  hyperkeratotic tissue approximately 0.5 cm in diameter.  No skin ulceration noted.  Left hallux medial nail border is incurvated and painful.  No associated signs of infection to the area Skin intact without wounds or rashes.  No erythema or ecchymosis.  MSK:  MMS 5/5 and symmetric, No areas of pain or tenderness. No gross deformity. ROM normal without crepitus.  Neuro:  SILT. Neg tinel's sign.  Able to wiggle toes. No motor deficits.       Signed by: Laqueta Carina, DPM

## 2016-01-11 ENCOUNTER — Telehealth (INDEPENDENT_AMBULATORY_CARE_PROVIDER_SITE_OTHER): Payer: Self-pay | Admitting: Student in an Organized Health Care Education/Training Program

## 2016-01-11 NOTE — Telephone Encounter (Signed)
I called pharmacy insurance only cover 90 days tablet I told them ok to refill 90 days for her and pt informed

## 2016-01-11 NOTE — Telephone Encounter (Signed)
PT called, said her pharmacy told her they had try to call us and they need to speak with the nurse about her rx to please call them    atorvastatin (LIPITOR) 20 MG tablet  [facility] - [place], [place] - [address] [phone] (Phone)  [phone] (Fax)     Any questions, to pls contact pt [phone]

## 2016-01-12 ENCOUNTER — Ambulatory Visit (INDEPENDENT_AMBULATORY_CARE_PROVIDER_SITE_OTHER): Payer: No Typology Code available for payment source | Admitting: Foot & Ankle Surgery

## 2016-01-12 ENCOUNTER — Encounter (INDEPENDENT_AMBULATORY_CARE_PROVIDER_SITE_OTHER): Payer: Self-pay | Admitting: Foot & Ankle Surgery

## 2016-01-12 VITALS — BP 124/78 | HR 78 | Ht 63.75 in | Wt 175.0 lb

## 2016-01-12 DIAGNOSIS — L600 Ingrowing nail: Secondary | ICD-10-CM

## 2016-01-12 NOTE — Progress Notes (Signed)
PODIATRIC SURGERY   PROGRESS NOTE  Patient is here for partial nail avulsion of her left great toe medial nail border.  This area continues to cause her pain.  She denies any current signs of infection.  Informed signed and discussed our symmetric benefits were reviewed for the procedure    The left hallux was identified as correct location and cleansed with alcohol.  A total of 3 mL of 2 percent lidocaine plain was injected without incident.  Once anesthesia was confirmed, the toe was prepped in usual sterile manner.  A tourniquet was placed at the base of the toe and the medial nail border was carefully removed, curetted, and treated with 3 occasions of phenol for 30 seconds each.  The area was then irrigated with alcohol and the tourniquet was removed.  Silvadene and a dry sterile dressing were applied.  She was given verbal and written instructions for after care and will see me back in 2 weeks.  She will call with any questions or concerns or if she thinks they are signs or symptoms of infection.

## 2016-01-14 ENCOUNTER — Encounter (INDEPENDENT_AMBULATORY_CARE_PROVIDER_SITE_OTHER): Payer: Self-pay | Admitting: Foot & Ankle Surgery

## 2016-01-17 ENCOUNTER — Telehealth (INDEPENDENT_AMBULATORY_CARE_PROVIDER_SITE_OTHER): Payer: Self-pay | Admitting: Student in an Organized Health Care Education/Training Program

## 2016-01-17 NOTE — Telephone Encounter (Signed)
Yes, she stated the DR.'s office called her to let her know DR.referral is required:

## 2016-01-17 NOTE — Telephone Encounter (Signed)
Pt called requesting a referral to   Dr. Ranell Patrick  She is going to see him tomorrow at 11 am.  Reason, Left eye coronia problem.  Thank you

## 2016-01-17 NOTE — Telephone Encounter (Signed)
sheis medicare , does she need a referral ?    Allison Silva Khatib Hriday Stai

## 2016-01-18 ENCOUNTER — Other Ambulatory Visit (INDEPENDENT_AMBULATORY_CARE_PROVIDER_SITE_OTHER): Payer: Self-pay | Admitting: Student in an Organized Health Care Education/Training Program

## 2016-01-18 ENCOUNTER — Ambulatory Visit (INDEPENDENT_AMBULATORY_CARE_PROVIDER_SITE_OTHER): Payer: No Typology Code available for payment source | Admitting: Foot & Ankle Surgery

## 2016-01-18 DIAGNOSIS — Q159 Congenital malformation of eye, unspecified: Secondary | ICD-10-CM

## 2016-01-18 NOTE — Telephone Encounter (Signed)
Ordered       Wendy [name] Wendy [name] Wendy [name]

## 2016-02-01 ENCOUNTER — Encounter (INDEPENDENT_AMBULATORY_CARE_PROVIDER_SITE_OTHER): Payer: Self-pay

## 2016-02-01 ENCOUNTER — Encounter (INDEPENDENT_AMBULATORY_CARE_PROVIDER_SITE_OTHER): Payer: Self-pay | Admitting: Student in an Organized Health Care Education/Training Program

## 2016-02-01 ENCOUNTER — Encounter (INDEPENDENT_AMBULATORY_CARE_PROVIDER_SITE_OTHER): Payer: Self-pay | Admitting: Foot & Ankle Surgery

## 2016-02-08 ENCOUNTER — Ambulatory Visit (INDEPENDENT_AMBULATORY_CARE_PROVIDER_SITE_OTHER): Payer: No Typology Code available for payment source | Admitting: Foot & Ankle Surgery

## 2016-02-17 ENCOUNTER — Ambulatory Visit: Payer: No Typology Code available for payment source | Admitting: Obstetrics & Gynecology

## 2016-02-17 ENCOUNTER — Encounter: Payer: Self-pay | Admitting: Obstetrics & Gynecology

## 2016-02-17 VITALS — BP 146/84 | Wt 175.0 lb

## 2016-02-17 DIAGNOSIS — N951 Menopausal and female climacteric states: Secondary | ICD-10-CM

## 2016-02-17 DIAGNOSIS — Z01419 Encounter for gynecological examination (general) (routine) without abnormal findings: Secondary | ICD-10-CM

## 2016-02-17 NOTE — Progress Notes (Signed)
Subjective:      Wendy [name] is a 60 y.o. female  G2P2002 who presents for annual exam. The patient has no complaints today. The patient is sexually active. GYN screening history: last pap: was normal. The patient is not taking hormone replacement therapy. Patient denies post-menopausal vaginal bleeding  Last colonoscopy was 2013.     OB History     Gravida Para Term Preterm AB Living    2 2 2     2     SAB TAB Ectopic Multiple Live Births                     Gynecologic History  No LMP recorded. Patient is postmenopausal.  Menstrual History:  STD's:    Last Pap: 2015. Results were: normal  Last mammogram: 2016. Results were: normal    Past Medical History:   Diagnosis Date   . Brain tumor (benign)    . Hypercholesteremia    . Hypertensive disorder        Past Surgical History:   Procedure Laterality Date   . BRAIN SURGERY  2009   . BREAST BIOPSY Right 2008    right breast biopsy- benign   . BREAST BIOPSY Right 1970s    right breast tumor removed 40+ years ago - benign   . BREAST CYST EXCISION  1970s    right breast tumor removed 40+ years ago - benign   . CESAREAN SECTION     . CORNEAL TRANSPLANT     . EYE SURGERY     . TUBAL LIGATION         Current Outpatient Prescriptions on File Prior to Visit   Medication Sig Dispense Refill   . atorvastatin (LIPITOR) 20 MG tablet Take 1 tablet (20 mg total) by mouth daily. 30 tablet 5   . difluprednate (DUREZOL) 0.05 % Emulsion ophthalmic solution Place 1 drop into the left eye daily.      Marland Kitchen FA-B6-B12-IFC-ALPHA LIPOIC ACD PO Take by mouth.     . Fluocinolone Acetonide (DERMOTIC) 0.01 % Oil 4 DROPS ou BID 1 Bottle 3   . ibuprofen (ADVIL,MOTRIN) 600 MG tablet Take 1 tablet (600 mg total) by mouth every 6 (six) hours as needed for Pain or Fever. 20 tablet 0   . lisinopril-hydrochlorothiazide (PRINZIDE,ZESTORETIC) 10-12.5 MG per tablet Take 1 tablet by mouth daily.     . Multiple Vitamin (MULTIVITAMIN) tablet Take 1 tablet by mouth daily.     . mupirocin (BACTROBAN) 2 %  ointment Apply topically 2 (two) times daily. 22 g 1   . triamcinolone (KENALOG) 0.1 % cream Apply topically 2 (two) times daily. 30 g 1   . [DISCONTINUED] Diethylpropion HCl 25 MG Tab Take 25 mg by mouth.     . [DISCONTINUED] esomeprazole (NEXIUM) 20 MG capsule Take 20 mg by mouth every morning before breakfast.       No current facility-administered medications on file prior to visit.        Allergies   Allergen Reactions   . Crestor [Rosuvastatin]      Throat swelling after Lisinopril was added, previous P[CP Stopped Crestor and Pt has been doing fine    . Iodine    . Penicillins    . Tetanus Immune Globulin    . Zocor [Simvastatin]      No response        Social History     Social History   . Marital status: Divorced  Spouse name: N/A   . Number of children: N/A   . Years of education: N/A     Occupational History   . Not on file.     Social History Main Topics   . Smoking status: Never Smoker   . Smokeless tobacco: Never Used   . Alcohol use No   . Drug use: No   . Sexual activity: Not on file     Other Topics Concern   . Not on file     Social History Narrative   . No narrative on file       Family History   Problem Relation Age of Onset   . Diabetes Mother    . Hypertension Mother    . Diabetes Father    . Hypertension Father    . Breast cancer Neg Hx        The following portions of the patient's history were reviewed and updated as appropriate: allergies, current medications, past family history, past medical history, past social history, past surgical history and problem list.    Review of Systems  A comprehensive review of systems was negative.      Objective:      BP 146/84   Wt 79.4 kg (175 lb)   BMI 30.27 kg/m           General appearance: alert, appears stated age and cooperative   Back:   No CVA tenderness.   Lungs: clear to auscultation bilaterally and normal percussion bilaterally   Breasts: normal appearance, no masses or tenderness, No nipple discharge or                     bleeding, No  axillary or supraclavicular adenopathy, Normal to palpation            without dominant masses   Heart: regular rate and rhythm   Abdomen: normal findings: bowel sounds normal, liver span normal to         percussion, no masses palpable, no organomegaly and soft, non-tender   Pelvic: cervix normal in appearance, external genitalia normal, no adnexal         masses or tenderness, no cervical motion tenderness, uterus normal        size, shape, and consistency and vagina normal without discharge   Extremities: extremities normal, atraumatic, no cyanosis or edema and Homans        sign is  negative, no sign of DVT        Assessment:      Normal gyn exam  Menopause      Plan:      All questions answered.  Follow up in 1 year.  Mammogram.  Thin prep Pap smear.

## 2016-02-17 NOTE — Addendum Note (Signed)
Addended by: Angela Nevin on: 02/17/2016 12:03 PM     Modules accepted: Orders

## 2016-02-24 ENCOUNTER — Encounter: Payer: Self-pay | Admitting: Obstetrics & Gynecology

## 2016-02-24 LAB — LIQUID-BASED PAP SMEAR, SCREENING: .: 0

## 2016-02-29 ENCOUNTER — Ambulatory Visit: Payer: No Typology Code available for payment source | Admitting: Ophthalmology

## 2016-03-14 ENCOUNTER — Other Ambulatory Visit: Payer: Self-pay | Admitting: Obstetrics & Gynecology

## 2016-03-14 DIAGNOSIS — Z1231 Encounter for screening mammogram for malignant neoplasm of breast: Secondary | ICD-10-CM

## 2016-03-27 ENCOUNTER — Encounter (INDEPENDENT_AMBULATORY_CARE_PROVIDER_SITE_OTHER): Payer: Self-pay | Admitting: Student in an Organized Health Care Education/Training Program

## 2016-03-28 ENCOUNTER — Ambulatory Visit
Admission: RE | Admit: 2016-03-28 | Discharge: 2016-03-28 | Disposition: A | Discharge status: Home / Self Care | Payer: No Typology Code available for payment source | Source: Ambulatory Visit | Attending: Obstetrics & Gynecology | Admitting: Obstetrics & Gynecology

## 2016-03-28 DIAGNOSIS — Z1231 Encounter for screening mammogram for malignant neoplasm of breast: Secondary | ICD-10-CM | POA: Insufficient documentation

## 2016-03-30 ENCOUNTER — Ambulatory Visit (INDEPENDENT_AMBULATORY_CARE_PROVIDER_SITE_OTHER): Payer: Self-pay

## 2016-03-30 ENCOUNTER — Encounter: Payer: Self-pay | Admitting: Obstetrics & Gynecology

## 2016-03-30 ENCOUNTER — Encounter (INDEPENDENT_AMBULATORY_CARE_PROVIDER_SITE_OTHER): Payer: Self-pay | Admitting: Sports Medicine

## 2016-03-30 ENCOUNTER — Ambulatory Visit (INDEPENDENT_AMBULATORY_CARE_PROVIDER_SITE_OTHER): Payer: No Typology Code available for payment source | Admitting: Sports Medicine

## 2016-03-30 VITALS — BP 133/80 | HR 84 | Resp 16 | Ht 64.0 in | Wt 174.0 lb

## 2016-03-30 DIAGNOSIS — M5431 Sciatica, right side: Secondary | ICD-10-CM

## 2016-03-30 DIAGNOSIS — G8929 Other chronic pain: Secondary | ICD-10-CM

## 2016-03-30 DIAGNOSIS — M25562 Pain in left knee: Secondary | ICD-10-CM

## 2016-03-30 MED ORDER — PREDNISONE 10 MG PO TABS
ORAL_TABLET | ORAL | 0 refills | Status: DC
Start: 2016-03-30 — End: 2016-06-27

## 2016-03-30 NOTE — Progress Notes (Signed)
[facility]  Carilyn Woolston A. Shona Simpson, Wendy [name]    Date of Exam:  03/30/2016   Patient:  Wendy [name]  DOB:  1955/06/07    AGE:  60 y.o.  MR#:  [id number]     Chief Complaint: Left knee and right hip pain.    HPI:  Wendy [name] is a pleasant 60 y.o.-year-old female who presents today with a history of left knee and right hip pain that she has had for the last 1 month for the hip pain that started after doing crunches at the gym. She localizes the pain to the buttock and radiates down to the lower leg, denies numbness/tingling or weakness.  Denies pervious back or hip issues.  She reports her symptoms are made worse with sitting and standing still or bending to get up out of a chair.  Taking ibuprofen with some improvements.       Reports intermittent left knee pain for a few years but increased recently due to right hip pain and compensating.  Localizes pain to the anterior knee and increased with stairs.  She reports swelling and stiffness post activities.  She denies mechanical symptoms such as catching or locking.  Wendy [name] denies a sense of instability.  She has no significant history of prior injury.  She has attempted modifcation of her activities with little improvement in her symptoms. Wendy [name] is now here for further evaluation and discussion of treatment options.    For other past medical history, social history, family history and past surgical history please see them listed below.    Club/School/Work Affiliation: none    Problem List:   Patient Active Problem List   Diagnosis   . Fuchs' corneal dystrophy   . Status post corneal transplant   . Pseudophakia of left eye   . Nuclear sclerosis of right eye   . Dry eyes, bilateral   . Blepharitis of both eyes   . post DSEK, OS   . Refractive error   . Corneal graft rejection   . ERM OS (epiretinal membrane, left eye)   . PVD (posterior vitreous detachment)   . OC (onychocryptosis)   . Acquired keratoderma        Past Medical History:     Past Medical History:   Diagnosis Date   . Brain tumor (benign)    . Hypercholesteremia    . Hypertensive disorder        Social History:   Social History   Substance Use Topics   . Smoking status: Never Smoker   . Smokeless tobacco: Never Used   . Alcohol use No       Family History:   Family History   Problem Relation Age of Onset   . Diabetes Mother    . Hypertension Mother    . Diabetes Father    . Hypertension Father    . Breast cancer Neg Hx        Past Surgical History:    Past Surgical History:   Procedure Laterality Date   . BRAIN SURGERY  2009   . BREAST BIOPSY Right 2008    right breast biopsy- benign   . BREAST BIOPSY Right 1970s    right breast tumor removed 40+ years ago - benign   . BREAST CYST EXCISION  1970s    right breast tumor removed 40+ years ago - benign   . CESAREAN SECTION     . CORNEAL TRANSPLANT     . EYE  SURGERY     . TUBAL LIGATION         Medications:      Current Outpatient Prescriptions:   .  atorvastatin (LIPITOR) 20 MG tablet, Take 1 tablet (20 mg total) by mouth daily., Disp: 30 tablet, Rfl: 5  .  difluprednate (DUREZOL) 0.05 % Emulsion ophthalmic solution, Place 1 drop into the left eye daily. , Disp: , Rfl:   .  FA-B6-B12-IFC-ALPHA LIPOIC ACD PO, Take by mouth., Disp: , Rfl:   .  ibuprofen (ADVIL,MOTRIN) 600 MG tablet, Take 1 tablet (600 mg total) by mouth every 6 (six) hours as needed for Pain or Fever., Disp: 20 tablet, Rfl: 0  .  lisinopril-hydrochlorothiazide (PRINZIDE,ZESTORETIC) 10-12.5 MG per tablet, Take 1 tablet by mouth daily., Disp: , Rfl:   .  Multiple Vitamin (MULTIVITAMIN) tablet, Take 1 tablet by mouth daily., Disp: , Rfl:        Allergies:    Allergies   Allergen Reactions   . Crestor [Rosuvastatin]      Throat swelling after Lisinopril was added, previous P[CP Stopped Crestor and Pt has been doing fine    . Iodine    . Penicillins    . Tetanus Immune Globulin    . Zocor [Simvastatin]      No response        ROS:  Constitutional: No fatigue, fever, weight loss,  or weight gain.   Ears, Nose, Mouth & Throat: No sore throat or hearing loss.   Cardiovascular: No chest pain, blood clots, or leg cramps.   Respiratory: No shortness of breath, cough, or difficulty breathing.   Gastrointestinal: No nausea, vomiting, diarrhea, or loss of appetite.   Genitourinary: No polyuria or kidney disease.   Musculoskeletal: No joint aches, muscle weakness or swelling of joints/body parts other than that mentioned above/below.  Integumentary: No finger nail changes or skin dryness.   Neurological: No numbness, burning discomfort, or headaches.   Psychiatric: No depression or anxiety.   Endocrine: No increased thirst, change in appetite or thyroid disease.   Hematologic/Lymphatic: No easy bruising or anemia.     EXAM:    Constitutional: Pt is well-developed, well-nourished, and in no distress.   HENT:   Head: Normocephalic and atraumatic.   Eyes: Conjunctivae are normal.   Neck: Neck supple.   Pulmonary/Chest: Effort normal.   Neurological: Pt is alert and oriented to person, place, and time.   Skin: Skin is warm and dry. No rash noted. Pt is not diaphoretic.   Psychiatric: Affect normal.     BACK  Gait:  Antalgic  Observation:  normal  Palpation:  Tone: normal                      Tenderness: none                      Trigger points: none  Range of Motion: Forward Flexion 40 + Deg with pain;  Extension 20 Deg;  Lateral Flexion L 30 Deg / R 30 Deg  Forward bending: exacerbates pain  Extension: symmetric / normal  FABER: Negative right  Straight Leg Raise: Negative right, positive right slump testing (pain shoots down leg), negative slump testing  DTR:    Patellar - R 2+;  L 2+               Achilles - R 2+;  L 2+  Ankle Clonus - Negative bilateral  Motor:  Globally  - 5 / 5                EHL - R 5 / 5 ; Left 5 / 5                Tibialis Anterior - R 5 / 5 ; Left 5 / 5                Peroneals - R 5 / 5 ; Left 5 / 5                Quadraceps - R 5 / 5 ; Left 5 / 5                 Hip Flexors - R 5 / 5 ; Left 5 / 5   Distal Sensory:  normal  Distal pulses:  normal    Left Knee Exam:  Skin intact  Erythema: None present  Swelling: present  Effusion: mild  Deformities: genu valgus, pes planus  ROM: 0-120  Patellofemoral Crepitus: present  Patellar Apprehension at 30 deg: Negative  Patellar grind: Positive  Lachman 1A  Anterior Drawer: WNL  Posterior drawer: WNL  Varus at 0 WNL at 30 WNL  Valgus at 0 WNL at 30 WNL  Medial McMurray's: Negative  Lateral McMurray's: Negative  Medial Joint Line TTP: None  Lateral Joint Line TTP: None  Strength: WNL    Other areas of Tenderness: None  DTR and Pathological Reflexes Intact  No lymphadenopathy  Sensation Intact to light touch all distributions of leg and foot  DF, PF, EHL motor intact  Foot Perfused with CR <2 sec  DP/PT pulse 2+    Vitals: BP 133/80   Pulse 84   Resp 16   Ht 1.626 m (5\' 4" )   Wt 78.9 kg (174 lb)   BMI 29.87 kg/m     General: Wendy [name] was pleasant, oriented, easily engaged, displayed logical thinking with clear speech and was neat in appearance. Her general appearance was normal, well-developed and well-nourished. She wasu comfortable in the presence of her sciatica and knee pain.    Gait: The patient demonstrated mildly antalgic gait with intact coordination and balance.     STUDIES: 4V Xrays of knees (AP bilateral standing, left Lat and Sunrise, right Sunrise) done and reviewed today - Joint space is narrowed in the medial compartment bilaterally, lateral compartment on the left and patellofemoral joint bilaterally. There are Moderate marginal osteophytes of the patellofemoral joint joint lines. There are no acute bony abnormalities. The patellae are centralized in femoral sulci.     4V Xrays of Lspine (AP, Lat, right and left Obliques) done and reviewed today - negative for any noted acute bony abnormalities.       ASSESSMENT/PLAN:  Wendy [name] was seen today for hip pain and knee pain.    Diagnoses and all orders for this  visit:    Right sided sciatica  -     X-ray lumbar spine AP lateral and obliques  -     predniSONE (DELTASONE) 10 MG tablet; Take 4 tabs x 3 days, then 3 tabs x 3 days, then 2 tabs x 3 days, then 1 tab x 3 days  -     Ambulatory referral to Physical Therapy    Chronic pain of left knee  -     XR Knees Standing AP Bilateral  -     XR Knee 1  Or 2 Views Left  -     XR Knee Right Sunrise  -     Ambulatory referral to Physical Therapy  -     Knee brace    The patient's diagnosis and treatment options were discussed in detail at today's visit.I have taken this opportunity to refer the patient to formal physical therapy. I have asked the patient to avoid high impact activities and deep knee bent positions if possible.  We will plan on follow up 8 weeks.  The patient will notify my clinic of any changes or worsening of their symptoms during the interim.  We discussed moving forward with an MRI if her pain persists or worsens with her right sciatica.     More than 30 minutes was spent with the patient at today's visit with more than 50% of that time spent discussing the patient's diagnosis, alternative treatments, potential outcomes and recommended treatment plan.

## 2016-04-18 ENCOUNTER — Other Ambulatory Visit (INDEPENDENT_AMBULATORY_CARE_PROVIDER_SITE_OTHER): Payer: Self-pay

## 2016-04-18 ENCOUNTER — Encounter (INDEPENDENT_AMBULATORY_CARE_PROVIDER_SITE_OTHER): Payer: Self-pay

## 2016-04-18 DIAGNOSIS — G8929 Other chronic pain: Secondary | ICD-10-CM

## 2016-04-18 NOTE — Progress Notes (Signed)
Received message from patient who wishes to proceed with MRI lumbar spine.  Order placed and faxed to [facility] at [phone] and confirmation received.  Patient is provided with contact information and is aware to call back upon completion of MRI for results with Dr Shona Simpson.  Patient is starting PT later this week as well.

## 2016-04-20 ENCOUNTER — Inpatient Hospital Stay: Payer: No Typology Code available for payment source

## 2016-04-20 DIAGNOSIS — M5387 Other specified dorsopathies, lumbosacral region: Secondary | ICD-10-CM | POA: Insufficient documentation

## 2016-04-20 DIAGNOSIS — M539 Dorsopathy, unspecified: Secondary | ICD-10-CM | POA: Insufficient documentation

## 2016-04-20 DIAGNOSIS — M25562 Pain in left knee: Secondary | ICD-10-CM | POA: Insufficient documentation

## 2016-04-20 NOTE — PT/OT Therapy Note (Signed)
INITIAL EVALUATION (Lumbar)    Name: Wendy [name] Age: 60 y.o. Occupation: retired SOC: 04/20/16  Referring Physician: Deland Pretty, DO MD recheck: not scheduled DOS: NA DOI: 03/23/16  # of Authorized Visits:   Visit #      Diagnosis (Treating/Medical):     ICD-10-CM    1. 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 Acute pain of left knee M25.562         SUBJECTIVE:    Mechanism of Injury: Pt reports that she was in the gym about one month ago, doing crunches with weights in sitting.  Felt fine while doing but then 2 days later started with R LB/sacral/buttock pain with radiating pain into posterior thigh and lateral lower leg. Pt denies N/T or burning.   Alleviating positions: standing and walking  Aggravating positions: sitting limited to 10 minutes, transitions to stand. Sleeping painful, most comfortable on L side (no pillow between legs). Significant pain with donning socks and shoes.     Pt also with L knee pain, reports x rays shows bone on bone, pain limited with stairs. Brace issued, and relief noted.     Patient's reason for seeking PT /Functional Limitations (PLOF): See POC    Past Medical History: hx of brain surgery pt reports lifting heavy weights is aggravating, so she self limites but no formal permanent lifting restrictions.   Past Medical History:   Diagnosis Date   . Brain tumor (benign)    . Hypercholesteremia    . Hypertensive disorder      Medications: none reported       Other Treatment/Prior Therapy: No  Prior Hospitalization: No  Hand Dominance:   Involved Side:     DiagnosticTests: xrays pt unaware of results, scheduled for beginning of next year.     Outcome Measure:   Pt's physical FS primary measure 30  Oswestry Score: 48                                      % Pain Score: 90% Rate Satisfaction with Current Function: 2/10   Living Environment: NA  Dwelling Entrance: NA  Patient lives with: NA    OBJECTIVE:    Vitals:  NA    Observation/Posture/Gait/Integumentary  Observation: significant lumbar lordosis, elevated sternal position with APT  Gait: poor PD and weight acceptance R LE, L knee valgus, pelvic hike noted R with stance compared with L .  Integumentary: No wound, lesion or rash noted    AROM: Lumbar Spine   Initial      Flexion < 1/4 motion with no segmental lumbar mobility      Extension        R L R L   Rotation Hinge at L5 with pain upon return to neural Hinge at L5 no pain.      Side Bending       (blank fields were intentionally left blank)    Range of Motion: (degrees)  Initial Right  AROM   Right  AROM Hip  Other: NA today InitialLeft AROM   Left AROM     Flexion       Extension       Abduction       Adduction       Internal Rotation       External Rotation     (blank fields were intentionally left  blank)    Repeated flexion/extension centralizes symptoms? Other: NA today      Initial R   R LE Strength  MMT /5 Initial  L   L     Hip Flexion(L1/2)       Hip Extension       Hip Abduction       Knee Flexion (S1)       Knee Extension (L3)       Ankle DF (L4)       Toe Ext (L5)     (blank fields were intentionally left blank)  NA today      Functional Strength:   Sit to Stand: UE Assist significant  Squat:  Unable    Palpation: Pain to palpation: very TTP SI, piriformis     Joint Mobility Assessment: R SLA stuck posterior, L SLA sacrum stuck anterior  Coccyx deviated to L  Hypomobile PA L sacrococcygeal joint  ST restrictions lateral sacral border L    Sensation to Light touch: Intact    SI Special Tests:   (+) March R    Treatment Initial Visit:  Evaluation    Patient Education on eval findings, POC, prognosis. (10')  Therapeutic exercise pt edu above and parameters for icing.   Therapeutic Activity: N/A  Manual:   FM to improve neutral coccyx from SB  FM to improve L PA sacrococcygeal joint  Bony contour clearing L lateral sacral border.   PA L SLA sacrum  Modalities: None pt edu in icing  parameters.   Rehab Potential:excellent  Is patient aware of diagnosis: Yes  For Next Visit Add psoas and iliacus STM assessment; pt driving to [place] tomorrow. Will call in the morning to try to schedule another appt prior to leaving as presently no availability tomorrow.     Demetrias Goodbar A. Yoshito Gaza, PT, DPT   [place] #[id number]    04/20/2016      Total Treatment (billable) Time:  45   Total Timed Minutes:  20    Plan of Care  [facility] Medicare Provider #: [phone]                Patient Name: Wendy [name]  MRN: [id number]  [facility] Medicare #: Medicare Sub. Num: [id number]  DOI: Onset of Problem / Injury: 03/23/16 DOS: N/A  SOC: 04/20/2016     Diagnosis:     ICD-10-CM    1. 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 Acute pain of left knee M25.562        ASSESSMENT: the patient is a 60 y.o. female presenting with LB radic who requires Physical Therapy for the following:  Impairments: R LE radic, altered coccyx and sacral mobility, altered posture, antalgic gait, L knee valgus     Barriers to Rehabilitation/Comorbidities/personal  factors:   Patient's age 72  Time since onset of injury/illness/exacerbation one month   Mechanism of injury/illness/exacerbation pt believes might have been exaserbated with gym work outs  Comorbidities L knee valgus, affecting gait    Pain located: LB, R buttock, R LE    Clinical presentation: evolving with co morbidities, gait, acute presentation    Functional Limitations (PLOF):  sitting limited to 10 minutes, transitions to stand. Sleeping painful, most comfortable on L side (no pillow between legs). Significant pain with donning socks and shoes. (PLOF no limitations with sitting, sleeping, dressing.)    Plan Of Care: Body Mechanics Education, NMR, Proprioceptive Activites, Instruction in HEP, Therapeutic Exercise and Therapeutic Activities  Frequency/Duration: 2 times a week for 16 sessions. Certification Status Ends: 06/15/16    Goals:   Goal Status   Date (Body Area, Impairment Goal,  Functional   Activity, Target Performance) Time Frame Status Date/  Initial   04/20/16 Pt's physical FS primary measure 54  16 sessions Initial Eval     04/20/16   Resolve R LE radic to allow pt to sleep 7 hours without waking due to pain.   16 sessions Initial Eval    04/20/16  VCT 4/5 with sitting sacked posture for sitting 45 minutes for functional sitting.   16 sessions Initial Eval    04/20/16   Lumbar flexion AROM and hip flexion > 110 degrees to don socks.   16 sessions Initial Eval      Signature: Yvonnia Tango A. [place], PT, DPT   [place] #[id number]    Date: 04/20/2016    Signature: Deland Pretty, DO ___________________________ Date: ____________    Patient Name: SHEKELA GOODRIDGE  MRN: [id number]

## 2016-04-20 NOTE — PT/OT Plan of Care (Signed)
Plan of Care  [facility] Medicare Provider #: [id number]                Patient Name: Wendy [name]  MRN: [id number]  [facility] Medicare #: Medicare Sub. Num: [id number]  DOI: Onset of Problem / Injury: 03/23/16 DOS: N/A  SOC: 04/20/2016     Diagnosis:     ICD-10-CM    1. 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 Acute pain of left knee M25.562        ASSESSMENT: the patient is a 60 y.o. female presenting with LB radic who requires Physical Therapy for the following:  Impairments: R LE radic, altered coccyx and sacral mobility, altered posture, antalgic gait, L knee valgus     Barriers to Rehabilitation/Comorbidities/personal  factors:   Patient's age 39  Time since onset of injury/illness/exacerbation one month   Mechanism of injury/illness/exacerbation pt believes might have been exaserbated with gym work outs  Comorbidities L knee valgus, affecting gait    Pain located: LB, R buttock, R LE    Clinical presentation: evolving with co morbidities, gait, acute presentation    Functional Limitations (PLOF):  sitting limited to 10 minutes, transitions to stand. Sleeping painful, most comfortable on L side (no pillow between legs). Significant pain with donning socks and shoes. (PLOF no limitations with sitting, sleeping, dressing.)    Plan Of Care: Body Mechanics Education, NMR, Proprioceptive Activites, Instruction in HEP, Therapeutic Exercise and Therapeutic Activities  Frequency/Duration: 2 times a week for 16 sessions. Certification Status Ends: 06/15/16    Goals:   Goal Status   Date (Body Area, Impairment Goal, Functional   Activity, Target Performance) Time Frame Status Date/  Initial   04/20/16 Pt's physical FS primary measure 54  16 sessions Initial Eval     04/20/16   Resolve R LE radic to allow pt to sleep 7 hours without waking due to pain.   16 sessions Initial Eval    04/20/16  VCT 4/5 with sitting sacked posture for sitting 45 minutes for functional sitting.   16 sessions Initial Eval     04/20/16   Lumbar flexion AROM and hip flexion > 110 degrees to don socks.   16 sessions Initial Eval      Signature: Milicent Acheampong A. [place], PT, DPT   [place] #[id number]    Date: 04/20/2016    Signature: Deland Pretty, DO ___________________________ Date: ____________    Patient Name: Wendy [name]  MRN: [id number]

## 2016-04-21 ENCOUNTER — Inpatient Hospital Stay
Payer: No Typology Code available for payment source | Attending: Sports Medicine | Admitting: Rehabilitative and Restorative Service Providers"

## 2016-04-21 DIAGNOSIS — M5387 Other specified dorsopathies, lumbosacral region: Secondary | ICD-10-CM

## 2016-04-21 DIAGNOSIS — M25562 Pain in left knee: Secondary | ICD-10-CM | POA: Insufficient documentation

## 2016-04-21 DIAGNOSIS — M539 Dorsopathy, unspecified: Secondary | ICD-10-CM | POA: Insufficient documentation

## 2016-04-21 NOTE — PT/OT Therapy Note (Signed)
DAILY NOTE   04/21/2016        Total Treatment (billable)Time: 45 minutes   Total Timed Minutes: 45 minutes  Visit Number: 2    Payor: MEDICARE HMO / Plan: [facility] AARP MEDICARE ADVANTAGE / Product Type: *No Product type* /    # of Authorized Visits:   Visit #:      Diagnosis (Treating/Medical):     ICD-10-CM    1. 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 Acute pain of left knee M25.562          Subjective:  Wendy [name] reports that she is sore in the tailbone after yesterday. Reports that the leg pain is the worst when waking up in the morning. It's not so much the sitting, it's more when it's the transition to get up.      Objective:   Treatment:  Therapeutic Exercise: to improve: Balance, Flexibility/ROM, Stabilization and Strength   Supine piriformis stretch with 30 second holds - added to HEP  Supine active hamstring stretch tension on/tension off x 20 reps - added to HEP  Patient education: discussion regarding neural tension    NMR:    R Pelvic posterior depression with end range holds to increase weight bearing into R LE to improve weight acceptance  Rhythmic initiation R pelvic AE/PD    Therapeutic Activities:    n/a    Manual Therapy:   ST mobs along piriformis, glutes, proximal hamstrings  Sacral PA mobs with increased time spent at superior L sacral base with FM's hip abduction  Neural tracing along sciatic nerve in sidelying with sciatic nerve glides tension on/tension off      Initial Evaluation Reference and/orCurrent Measurements (ROM, Strength, Girth, Outcomes, etc.):        Modalities: None     Assessment (response to treatment):   Patient with significant compression at sciatic notch which seems to be currently driving patient's symptoms. Performed neural tracing through that area with some relief, however continues to have pain with sitting (compressing that area). Would benefit from continuing to improve pelvic girdle mechanical restrictions and then focusing on sitting  posture.    Progress towards functional goals:     Goals:          Goal Status   Date (Body Area, Impairment Goal, Functional   Activity, Target Performance) Time Frame Status Date/  Initial   04/20/16 Pt's physical FS primary measure 54  16 sessions Initial Eval     04/20/16   Resolve R LE radic to allow pt to sleep 7 hours without waking due to pain.   16 sessions Initial Eval    04/20/16  VCT 4/5 with sitting sacked posture for sitting 45 minutes for functional sitting.   16 sessions Initial Eval    04/20/16   Lumbar flexion AROM and hip flexion > 110 degrees to don socks.   16 sessions Initial Eval          Patient requires continued skilled care to: decreased neural tension and improve sitting posture to allow for improved sitting tolerance; improve LE strength to decrease knee pain and improve ability to navigate stairs    Plan:  Continue with Plan of Care    Will [place], [place] #[id number]  04/21/2016

## 2016-05-02 ENCOUNTER — Inpatient Hospital Stay
Payer: No Typology Code available for payment source | Attending: Sports Medicine | Admitting: Rehabilitative and Restorative Service Providers"

## 2016-05-02 DIAGNOSIS — M539 Dorsopathy, unspecified: Secondary | ICD-10-CM | POA: Insufficient documentation

## 2016-05-02 DIAGNOSIS — M5387 Other specified dorsopathies, lumbosacral region: Secondary | ICD-10-CM

## 2016-05-02 DIAGNOSIS — M25562 Pain in left knee: Secondary | ICD-10-CM | POA: Insufficient documentation

## 2016-05-02 NOTE — PT/OT Therapy Note (Signed)
DAILY NOTE   05/02/2016        Total Treatment (billable)Time: 45 minutes   Total Timed Minutes: 45 minutes  Visit Number: 3  Payor: MEDICARE HMO / Plan: [facility] AARP MEDICARE ADVANTAGE / Product Type: MANAGED MEDICARE /    # of Authorized Visits: 16 Visit #: 2    Diagnosis (Treating/Medical):     ICD-10-CM    1. 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 Acute pain of left knee M25.562          Subjective:  Wendy [name] reports that the back feels a little bit better, but I's not back to normal yet. I've been doing my exercises religiously.     Objective:   Treatment:  Therapeutic Exercise: to improve: Balance, Flexibility/ROM, Stabilization and Strength   RCB x 5' with L1 resistance to end session to promote mobility  Supine hamstring stretch tension on/tension off x 15 reps  Clamshells x 15 reps R LE    NMR:    R Pelvic posterior depression with end range holds to increase weight bearing into R LE to improve weight acceptance    Neural tracing in sidelying along R sciatic nerve with emphasis on sciatic notch with sciatic nerve glides tension on/tension off    Therapeutic Activities:    n/a    Manual Therapy:   Superficial fascia mobs along length of hamstrings with FM's hip and knee flex/extension  ST mobs R hamstrings with emphasis on proximal aspect  ST mobs R glutes, piriformis with FM's hip flex/adduction  Bony contour clearing ischial tuberosity  Innominate anterior rotation mobs R LE      Initial Evaluation Reference and/orCurrent Measurements (ROM, Strength, Girth, Outcomes, etc.):        Modalities: None     Assessment (response to treatment):   Significant improvement in neural tension from previous sessions and increased sitting tolerance. Patient with proximal ST and neural restrictions that seem to be correlated to patient's current pain, although improving.     Progress towards functional goals:     Goals:          Goal Status   Date (Body Area, Impairment Goal, Functional   Activity,  Target Performance) Time Frame Status Date/  Initial   04/20/16 Pt's physical FS primary measure 54  16 sessions Initial Eval     04/20/16   Resolve R LE radic to allow pt to sleep 7 hours without waking due to pain.   16 sessions Initial Eval    04/20/16  VCT 4/5 with sitting sacked posture for sitting 45 minutes for functional sitting.   16 sessions Initial Eval    04/20/16   Lumbar flexion AROM and hip flexion > 110 degrees to don socks.   16 sessions Initial Eval        Patient requires continued skilled care to: decreased neural tension and improve sitting posture to allow for improved sitting tolerance; improve LE strength to decrease knee pain and improve ability to navigate stairs    Plan:  Continue with Plan of Care    Will [place], [place] #[id number]  05/02/2016

## 2016-05-04 ENCOUNTER — Inpatient Hospital Stay: Payer: No Typology Code available for payment source

## 2016-05-04 DIAGNOSIS — M25562 Pain in left knee: Secondary | ICD-10-CM

## 2016-05-04 DIAGNOSIS — M539 Dorsopathy, unspecified: Secondary | ICD-10-CM | POA: Insufficient documentation

## 2016-05-04 DIAGNOSIS — M5387 Other specified dorsopathies, lumbosacral region: Secondary | ICD-10-CM

## 2016-05-04 NOTE — PT/OT Therapy Note (Signed)
DAILY NOTE   05/04/2016        Total Treatment (billable)Time: 32 minutes   Total Timed Minutes: 32 minutes  Visit Number: 4  Payor: MEDICARE HMO / Plan: [facility] AARP MEDICARE ADVANTAGE / Product Type: MANAGED MEDICARE /    # of Authorized Visits: 16 Visit #: 4    Pt is 15 min late to appt    Diagnosis (Treating/Medical):     ICD-10-CM    1. 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 Acute pain of left knee M25.562          Subjective:  Wendy [name] states that she had pain in her leg yesterday sitting and felt pain down the leg.SKTC helped relieve it some. This morning she states that she still has some discomfort down the side of the leg.      Objective:   Treatment:  Therapeutic Exercise: to improve: Balance, Flexibility/ROM, Stabilization and Strength   RCB x 5' with L1 resistance to end session to promote mobility  Supine hamstring stretch tension on/tension off x 15 reps  Clamshells x 15 reps R LE  Below HEP instructed and performed in the clinic today.    Access Code: [facility]   URL: [email or web address]   Date: 05/04/2016   Prepared by: Eugene Garnet     Exercises  Supine Pelvic Tilt - 15 reps - 2 sets - 2x daily - 6-7x weekly  Supine Double Knee to Chest - 15 reps - 2 sets - 2x daily - 6-7x weekly  Standing Lumbar Extension - 15 reps - 2 sets - 2x daily - 6-7x weekly  Supine Lower Trunk Rotation - 15 reps - 2 sets - 2x daily - 6-7x weekly  Clamshell - 15 reps - 2 sets - 2x daily - 6-7x weekly     NMR:  N/A    Therapeutic Activities:    n/a    Manual Therapy:   Superficial fascia mobs along length of hamstrings with FM's hip and knee flex/extension  ST mobs R hamstrings with emphasis on proximal aspect  ST mobs R glutes, piriformis with FM's hip flex/adduction  Bony contour clearing ischial tuberosity  Innominate anterior rotation mobs R LE      Initial Evaluation Reference and/orCurrent Measurements (ROM, Strength, Girth, Outcomes, etc.):        Modalities: None     Assessment  (response to treatment):   Pt continues to have some posterior hip pain, likely associated with some neural mobility deficits. Worked on directional preference today and gave her instructions to pay attention to activity and to exercises that decrease her sx.     Progress towards functional goals:     Goals:          Goal Status   Date (Body Area, Impairment Goal, Functional   Activity, Target Performance) Time Frame Status Date/  Initial   04/20/16 Pt's physical FS primary measure 54  16 sessions Initial Eval     04/20/16   Resolve R LE radic to allow pt to sleep 7 hours without waking due to pain.   16 sessions Initial Eval    04/20/16  VCT 4/5 with sitting sacked posture for sitting 45 minutes for functional sitting.   16 sessions Initial Eval    04/20/16   Lumbar flexion AROM and hip flexion > 110 degrees to don socks.   16 sessions Initial Eval        Patient requires continued skilled care to: decreased neural  tension and improve sitting posture to allow for improved sitting tolerance; improve LE strength to decrease knee pain and improve ability to navigate stairs    Plan:  Continue with Plan of Care   Reassess directional preference.    Warnell Bureau PT, DPT [phone]  05/04/2016

## 2016-05-09 ENCOUNTER — Inpatient Hospital Stay: Payer: No Typology Code available for payment source

## 2016-05-11 ENCOUNTER — Inpatient Hospital Stay: Payer: No Typology Code available for payment source | Admitting: Rehabilitative and Restorative Service Providers"

## 2016-05-18 ENCOUNTER — Inpatient Hospital Stay: Payer: No Typology Code available for payment source | Admitting: Rehabilitative and Restorative Service Providers"

## 2016-05-18 ENCOUNTER — Other Ambulatory Visit (INDEPENDENT_AMBULATORY_CARE_PROVIDER_SITE_OTHER): Payer: Self-pay

## 2016-05-18 DIAGNOSIS — M25562 Pain in left knee: Secondary | ICD-10-CM | POA: Insufficient documentation

## 2016-05-18 DIAGNOSIS — M545 Low back pain, unspecified: Secondary | ICD-10-CM

## 2016-05-18 DIAGNOSIS — M5387 Other specified dorsopathies, lumbosacral region: Secondary | ICD-10-CM

## 2016-05-18 DIAGNOSIS — M539 Dorsopathy, unspecified: Secondary | ICD-10-CM | POA: Insufficient documentation

## 2016-05-18 NOTE — PT/OT Therapy Note (Signed)
DAILY NOTE   05/18/2016        Total Treatment (billable)Time: 44 minutes   Total Timed Minutes: 44 minutes  Visit Number: 5    Payor: MEDICARE HMO / Plan: [facility] AARP MEDICARE ADVANTAGE / Product Type: MANAGED MEDICARE /    # of Authorized Visits: 16 Visit #: 4    Diagnosis (Treating/Medical):     ICD-10-CM    1. 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 Acute pain of left knee M25.562          Subjective:  Wendy [name] reports that when she wakes up in the morning, has pain on posterior R hip. Will get pain in the calf region, but it's not as consistent and seems to be more random.    Objective:   Treatment:  Therapeutic Exercise: to improve: Balance, Flexibility/ROM, Stabilization and Strength   RCB x 5' with L1 resistance to end session to promote mobility  Supine hamstring stretch tension on/tension off x 10 reps  Reviewed HEP with emphasis on stretching    Carried forward for reference:  Access Code: [facility]   URL: [email or web address]   Date: 05/04/2016   Prepared by: Eugene Garnet   Exercises  Supine Pelvic Tilt - 15 reps - 2 sets - 2x daily - 6-7x weekly  Supine Double Knee to Chest - 15 reps - 2 sets - 2x daily - 6-7x weekly  Standing Lumbar Extension - 15 reps - 2 sets - 2x daily - 6-7x weekly  Supine Lower Trunk Rotation - 15 reps - 2 sets - 2x daily - 6-7x weekly  Clamshell - 15 reps - 2 sets - 2x daily - 6-7x weekly     NMR:    R Pelvic AE with end range holds and COI to increase core initiation to improve pelvic girdle stability  R Pelvic AE with end range holds and COI with LE flex/ adduction/ IR/ DF/ Inv to improve neuromuscular firing to improve stepping ability  R Pelvic posterior depression with end range holds to increase weight bearing into R LE to improve weight acceptance    Neural tracing throughout R sciatic nerve with sciatic nerve glides tension on/tension off - patient placed in lumbar extension bias    Therapeutic Activities:    n/a    Manual Therapy:   Gapping  R SI joint in sidelying  ST mobs R l/s paraspinals  ST mobs along proximal to mid ITB  ST mobs proximal hamstrings      Initial Evaluation Reference and/orCurrent Measurements (ROM, Strength, Girth, Outcomes, etc.):     Slump: (-) R  Extension test: (+) R       Modalities: None     Assessment (response to treatment):   Patient with significant improvement in neural mobility throughout R LE. Patient with mild tension at sciatic notch and slight pelvic alignment deficits that were addressed. Patient gaining good relief at home with current HEP.    Progress towards functional goals:     Goals:          Goal Status   Date (Body Area, Impairment Goal, Functional   Activity, Target Performance) Time Frame Status Date/  Initial   04/20/16 Pt's physical FS primary measure 54  16 sessions Initial Eval     04/20/16   Resolve R LE radic to allow pt to sleep 7 hours without waking due to pain.   16 sessions Initial Eval    04/20/16  VCT 4/5 with sitting  sacked posture for sitting 45 minutes for functional sitting.   16 sessions Initial Eval    04/20/16   Lumbar flexion AROM and hip flexion > 110 degrees to don socks.   16 sessions Initial Eval        Patient requires continued skilled care to: decreased neural tension and improve sitting posture to allow for improved sitting tolerance; improve LE strength to decrease knee pain and improve ability to navigate stairs    Plan:  Continue with Plan of Care   Re-assess neural tension    Chipper [place], [place] #[id number]  05/18/2016

## 2016-05-23 ENCOUNTER — Inpatient Hospital Stay: Payer: No Typology Code available for payment source | Attending: Sports Medicine | Admitting: Physical Therapist

## 2016-05-23 DIAGNOSIS — M539 Dorsopathy, unspecified: Secondary | ICD-10-CM | POA: Insufficient documentation

## 2016-05-23 DIAGNOSIS — M5387 Other specified dorsopathies, lumbosacral region: Secondary | ICD-10-CM

## 2016-05-23 DIAGNOSIS — M25562 Pain in left knee: Secondary | ICD-10-CM | POA: Insufficient documentation

## 2016-05-23 NOTE — PT/OT Therapy Note (Signed)
DAILY NOTE   05/23/2016        Total Treatment (billable)Time: 44 minutes   Total Timed Minutes: 44 minutes  Visit Number: 6    Payor: MEDICARE HMO / Plan: [facility] AARP MEDICARE ADVANTAGE / Product Type: MANAGED MEDICARE /    # of Authorized Visits: 16 Visit #: 6    Diagnosis (Treating/Medical):     ICD-10-CM    1. 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 Acute pain of left knee M25.562          Subjective:  Wendy [name] reports that she has bone on bone on the left knee.     Objective:   Treatment:  Therapeutic Exercise: to improve: Balance, Flexibility/ROM, Stabilization and Strength   Core bracing  Supine marches 10 x 3  Supine hip isometric x 2 min bilat    Carried forward for reference:  Access Code: [facility]   URL: [email or web address]   Date: 05/04/2016   Prepared by: Wendy [name]   Exercises  Supine Pelvic Tilt - 15 reps - 2 sets - 2x daily - 6-7x weekly  Supine Double Knee to Chest - 15 reps - 2 sets - 2x daily - 6-7x weekly  Standing Lumbar Extension - 15 reps - 2 sets - 2x daily - 6-7x weekly  Supine Lower Trunk Rotation - 15 reps - 2 sets - 2x daily - 6-7x weekly  Clamshell - 15 reps - 2 sets - 2x daily - 6-7x weekly     NMR:    Hip hinge training  Hip hinge squat and sit to stand x 10    Therapeutic Activities:    n/a    Manual Therapy:   STM contours of patella   Patellar mobs especially inferior and superior  STM distal quad/add/ITB      Initial Evaluation Reference and/orCurrent Measurements (ROM, Strength, Girth, Outcomes, etc.):     Slump: (-) R  Extension test: (+) R       Modalities: None     Assessment (response to treatment):   Improved objective post session in terms of relief. Would benefit from review of hip hinge and review hip hinge squat. Back and sciatic pain comes on with trunk flexion, so emphasis on neutral spine with hip hinge.      Progress towards functional goals:     Goals:          Goal Status   Date (Body Area, Impairment Goal, Functional   Activity,  Target Performance) Time Frame Status Date/  Initial   04/20/16 Pt's physical FS primary measure 54  16 sessions Initial Eval     04/20/16   Resolve R LE radic to allow pt to sleep 7 hours without waking due to pain.   16 sessions Initial Eval    04/20/16  VCT 4/5 with sitting sacked posture for sitting 45 minutes for functional sitting.   16 sessions Initial Eval    04/20/16   Lumbar flexion AROM and hip flexion > 110 degrees to don socks.   16 sessions Initial Eval        Patient requires continued skilled care to: decreased neural tension and improve sitting posture to allow for improved sitting tolerance; improve LE strength to decrease knee pain and improve ability to navigate stairs    Plan:  Continue with Plan of Care   Re-assess neural tension  Re-assess hip hinge and squat    Wendy [name], [place]  [facility]#[id number]  05/23/2016

## 2016-05-29 ENCOUNTER — Other Ambulatory Visit (INDEPENDENT_AMBULATORY_CARE_PROVIDER_SITE_OTHER): Payer: Self-pay | Admitting: Sports Medicine

## 2016-05-29 DIAGNOSIS — M5410 Radiculopathy, site unspecified: Secondary | ICD-10-CM

## 2016-05-29 DIAGNOSIS — M545 Low back pain: Secondary | ICD-10-CM

## 2016-05-30 ENCOUNTER — Inpatient Hospital Stay: Payer: No Typology Code available for payment source | Admitting: Physical Therapist

## 2016-06-06 ENCOUNTER — Inpatient Hospital Stay: Payer: No Typology Code available for payment source | Admitting: Physical Therapist

## 2016-06-13 ENCOUNTER — Inpatient Hospital Stay: Payer: No Typology Code available for payment source

## 2016-06-20 ENCOUNTER — Inpatient Hospital Stay: Payer: No Typology Code available for payment source | Admitting: Physical Therapist

## 2016-06-22 ENCOUNTER — Telehealth (INDEPENDENT_AMBULATORY_CARE_PROVIDER_SITE_OTHER): Payer: Self-pay | Admitting: Student in an Organized Health Care Education/Training Program

## 2016-06-22 NOTE — Telephone Encounter (Signed)
I called her back she said after she is taking this medicine she can't even wear her shoes her joint pain that bad and she stopped medicine and getting much more better she would like to get another medicine for hyperlipidemia and sent it [facility] in [place]     Thanks

## 2016-06-22 NOTE — Telephone Encounter (Signed)
Left v/m make an appointment

## 2016-06-22 NOTE — Telephone Encounter (Signed)
Have her follow up since it has been more than 6months    Wendy [name] Wendy [name]

## 2016-06-22 NOTE — Telephone Encounter (Signed)
Pt called to let Dr.Know she been experiencing a lot of joint pain, she thinks it is side effect from Lipitor , she stopped taking it and the pain  Went away.. She nee advised...[phone]

## 2016-06-27 ENCOUNTER — Ambulatory Visit (INDEPENDENT_AMBULATORY_CARE_PROVIDER_SITE_OTHER)
Payer: No Typology Code available for payment source | Admitting: Student in an Organized Health Care Education/Training Program

## 2016-06-27 ENCOUNTER — Encounter (INDEPENDENT_AMBULATORY_CARE_PROVIDER_SITE_OTHER): Payer: Self-pay | Admitting: Student in an Organized Health Care Education/Training Program

## 2016-06-27 VITALS — BP 155/93 | HR 72 | Temp 97.8°F | Resp 20 | Ht 63.5 in | Wt 177.8 lb

## 2016-06-27 DIAGNOSIS — I10 Essential (primary) hypertension: Secondary | ICD-10-CM

## 2016-06-27 DIAGNOSIS — E782 Mixed hyperlipidemia: Secondary | ICD-10-CM

## 2016-06-27 DIAGNOSIS — E559 Vitamin D deficiency, unspecified: Secondary | ICD-10-CM

## 2016-06-27 DIAGNOSIS — Z23 Encounter for immunization: Secondary | ICD-10-CM

## 2016-06-27 DIAGNOSIS — R7989 Other specified abnormal findings of blood chemistry: Secondary | ICD-10-CM

## 2016-06-27 NOTE — Progress Notes (Signed)
CC     Hypertension: occasionally compliant  With Prinizide 10-12.5   - The patient feels this issue is  controlled.  - The patient has not  been monitoring blood pressure.  - The patient has  been adhering to the recommended no-added-salt diet.  - The patient has not been compliant to the recommended exercise program.  - The patient is  tolerating the current medication regimen well.  - The patient denies orthostasis.  - The patient denies chest pain.  - The patient denies dyspnea.  - The patient denies edema.  Previous PCP : dr Danelle Earthly   Hx HLD  Stopped  cholesterol medication/lipitor 20   No Myalgias,  Muscle aches , bodyaches  Compliant with low saturated and low carbohydrate Diet  No hx abnormal liver test related to statin therapy         Pt failed to lose weight   Body mass index is 31 kg/m.      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 Fuchs' corneal dystrophy   . Status post corneal transplant   . Pseudophakia of left eye   . Nuclear sclerosis of right eye   . Dry eyes, bilateral   . Blepharitis of both eyes   . post DSEK, OS   . Refractive error   . Corneal graft rejection   . ERM OS (epiretinal membrane, left eye)   . PVD (posterior vitreous detachment)   . OC (onychocryptosis)   . Acquired keratoderma   . Sciatica associated with disorder of lumbosacral spine   . Acute pain of left knee       Past Medical History:   Diagnosis Date   . Brain tumor  (benign)    . Hypercholesteremia    . Hypertensive disorder        Past Surgical History:   Procedure Laterality Date   . BRAIN SURGERY  2009   . BREAST BIOPSY Right 2008    right breast biopsy- benign   . BREAST BIOPSY Right 1970s    right breast tumor removed 40+ years ago - benign   . BREAST CYST EXCISION  1970s    right breast tumor removed 40+ years ago - benign   . CESAREAN SECTION     . CORNEAL TRANSPLANT     . EYE SURGERY     . TUBAL LIGATION         Social History     Social History   . Marital status: Divorced     Spouse name: N/A   . Number of children: N/A   . Years of education: N/A     Occupational  History   . Not on file.     Social History Main Topics   . Smoking status: Never Smoker   . Smokeless tobacco: Never Used   . Alcohol use No   . Drug use: No   . Sexual activity: Not on file     Other Topics Concern   . Not on file     Social History Narrative   . No narrative on file       Family History   Problem Relation Age of Onset   . Diabetes Mother    . Hypertension Mother    . Diabetes Father    . Hypertension Father    . Breast cancer Neg Hx        Allergies   Allergen Reactions   . Atorvastatin      Joint pain    . Crestor [Rosuvastatin]      Throat swelling after Lisinopril was added, previous P[CP Stopped Crestor and Pt has been doing fine    . Iodine    . Penicillins    . Tetanus Immune Globulin    . Zocor [Simvastatin]      No response        Current Outpatient Prescriptions   Medication Sig Dispense Refill   . difluprednate (DUREZOL) 0.05 % Emulsion ophthalmic solution Place 1 drop into the left eye daily.      Marland Kitchen FA-B6-B12-IFC-ALPHA LIPOIC ACD PO Take by mouth.     Marland Kitchen lisinopril-hydrochlorothiazide (PRINZIDE,ZESTORETIC) 10-12.5 MG per tablet Take 1 tablet by mouth daily.     . Multiple Vitamin (MULTIVITAMIN) tablet Take 1 tablet by mouth daily.     Marland Kitchen ibuprofen (ADVIL,MOTRIN) 600 MG tablet Take 1 tablet (600 mg total) by mouth every 6 (six) hours as needed for Pain or Fever. 20 tablet 0     No  current facility-administered medications for this visit.        Constitutional: negative for fatigue, fever, chills   Eyes: + vision problems   Ears, nose, mouth, throat, and face: negative for nose bleeds, nasal congestion   Respiratory: negative for SOB, cough , wheezing   Cardiovascular: negative for CP, dizziness, palpitations, swelling , orthopnea   Gastrointestinal: negative for diarrhea , nausea, vomiting, constipation   Genitourinary:negative for dysuria, frequency and hematuria   Integument/breast: negative for nodule   Hematologic/lymphatic: negative for enlarged LAD,   Musculoskeletal:negative for myalgia, joint pain and swelling   Neurological: negative for HA, dizziness, tingling ,   Behavioral/Psych: negative for anxiety, depression    Other ROS were reviewed and were neg         Vitals:    06/27/16 0937   BP: (!) 155/93   Pulse:    Resp:    Temp:    SpO2:        CONSTITUTIONAL Patient is afebrile, Vital signs reviewed. Hypertensive.   HEAD Atraumatic, Normocephallc.   EYES Eyes are normal to inspection, PERRL, No discharge from eyes,   ENT Ears normal to inspection, Nose examination normal, Posterior pharynx normal.   NECK Normal ROM, No jugular venous distention, No meningeal signs.   RESPIRATORY CHEST Chest is nontender, Breath sounds normal.   CARDIOVASCULAR RRR, Heart sounds normal, Normal S1 S2.   ABDOMEN Abdomen is nontender, No pulsatile masses, No other masses,   Bowel sounds normal, No distension, No peritoneal signs.   BACK There is no CVA Tenderness, There is no tenderness to palpation.   UPPER EXTREMITY Inspection normal, No cyanosis.  LOWER EXTREMITY Inspection normal, No cyanosis.   NEURO Cranial nerves intact, No cerebellar deficits, Normal DTRs, Babinski absent, Speech normal, Memory normal   SKIN Skin is warm, Skin is dry, Skin is normal color   LYMPHATIC No adenopathy in neck.   PSYCHIATRIC Oriented X 3, Normal affect. Normal insight.      A/P    1. Mixed hyperlipidemia  Lipid  panel   2. Need for hepatitis B vaccination  Hepatitis B vaccine adult IM   3. Essential hypertension     4. Low vitamin D level         DASH  BP log   Risk & Benefits of the  medication(s) were explained to the patient (and family) who verbalized understanding & agreed to the treatment plan. Patient (family) encouraged to contact me/clinical staff with any questions/concerns  Check your blood pressure at home or at the drug store on a regular basis.  Goal is under 140/90.  Call or Email on Mychart about the resullts or Schedule appointment if abnormal    normal BP is 120/80 and that high blood pressure is considered when there is persistent elevation of BP > 140/90.  the risks of chronically elevated BP in the long term to include higher risk of CVA and MI.  continue  Medication regularly  salt avoidance, weight loss, regular aerobic exercise, avoidance of heavy use of alcohol, tobacco cessation and medication   At this time I recommended   Increase vegetables, fruits, and fiber in your diet.  Decrease sodium in your diet (less than 2000mg  daily).  Decrease eating out.  Limit caffeine and alcohol intake.    Exercise at least 30 minutes 5 days per week.  Work on decreasing stress in your life in whatever way makes sense for you.    Patient counseled to eat a low salt diet(DASH diet)  Followup in 2-  three weeks with BP log

## 2016-06-27 NOTE — Progress Notes (Signed)
Have you seen any new specialist/physicians since you were last here?    No       Limb alert protocol reviewed? Yes or No   Yes     Pt declined flu shot

## 2016-06-27 NOTE — Patient Instructions (Signed)
Try to limit the amount of saturated fat and trans fat in your diet.  These are in foods like red meat, vegetable oil, butter, dairy products that are not low-fat (such as most cheese, 2% or whole milk, creamer, etc), fried foods, and rich sweets like cakes/donuts/etc.  You should aim to keep saturated fats under 15-20g per day.  In addition, read ingredient labels and avoid any hydrogenated or partially-hydrogenated oils.      There are some fats that are good for you - these are unsaturated fats (especially monounsaturated).  These are found in olive oil, canola oil, nuts, seeds, and avocados.  Try to replace saturated and trans fats with these kinds of foods.    Try to limit the amount of sodium in your diet to less than 2000mg per day.  Most people do not get the majority of their sodium from table salt.  The sodium comes from processed foods.  Anything canned, frozen, pre-packaged, etc usually has a large amount of sodium, so read the labels.  Deli meat is another common source.      Eating out is the most common source of dietary sodium.  Almost all restaurant meals have extremely high sodium (1000-3000mg plus!), even in most cases the salads.  Minimize eating out, and ask if they have any low sodium options.  But generally, fresh cooking at home is the healthiest.      Try to increase your physical activity, especially cardio.  Even starting small with walking 10 minutes a day helps.  Gradually build up to a goal of 30 minutes of moderate activity (brisk walking) 5 days per week.  You should also add in some core strength training (abdomen and back) and light stretching to help prevent injury and chronic back pain.

## 2016-06-28 ENCOUNTER — Encounter (INDEPENDENT_AMBULATORY_CARE_PROVIDER_SITE_OTHER): Payer: Self-pay | Admitting: Student in an Organized Health Care Education/Training Program

## 2016-06-30 ENCOUNTER — Encounter (INDEPENDENT_AMBULATORY_CARE_PROVIDER_SITE_OTHER): Payer: Self-pay | Admitting: Student in an Organized Health Care Education/Training Program

## 2016-07-03 ENCOUNTER — Encounter (INDEPENDENT_AMBULATORY_CARE_PROVIDER_SITE_OTHER): Payer: Self-pay | Admitting: Sports Medicine

## 2016-07-04 ENCOUNTER — Encounter (INDEPENDENT_AMBULATORY_CARE_PROVIDER_SITE_OTHER): Payer: Self-pay | Admitting: Student in an Organized Health Care Education/Training Program

## 2016-07-05 ENCOUNTER — Other Ambulatory Visit (FREE_STANDING_LABORATORY_FACILITY): Payer: No Typology Code available for payment source

## 2016-07-05 ENCOUNTER — Ambulatory Visit (INDEPENDENT_AMBULATORY_CARE_PROVIDER_SITE_OTHER): Payer: No Typology Code available for payment source

## 2016-07-05 DIAGNOSIS — Z23 Encounter for immunization: Secondary | ICD-10-CM

## 2016-07-05 DIAGNOSIS — E782 Mixed hyperlipidemia: Secondary | ICD-10-CM

## 2016-07-05 LAB — LIPID PANEL
Cholesterol / HDL Ratio: 5.2
Cholesterol: 235 mg/dL — ABNORMAL HIGH (ref 0–199)
HDL: 45 mg/dL (ref 40–9999)
LDL Calculated: 165 mg/dL — ABNORMAL HIGH (ref 0–99)
Triglycerides: 127 mg/dL (ref 34–149)
VLDL Calculated: 25 mg/dL (ref 10–40)

## 2016-07-05 LAB — HEMOLYSIS INDEX: Hemolysis Index: 5 (ref 0–18)

## 2016-07-07 ENCOUNTER — Encounter (INDEPENDENT_AMBULATORY_CARE_PROVIDER_SITE_OTHER): Payer: Self-pay | Admitting: Student in an Organized Health Care Education/Training Program

## 2016-07-07 ENCOUNTER — Telehealth (INDEPENDENT_AMBULATORY_CARE_PROVIDER_SITE_OTHER): Payer: Self-pay | Admitting: Student in an Organized Health Care Education/Training Program

## 2016-07-07 ENCOUNTER — Other Ambulatory Visit (INDEPENDENT_AMBULATORY_CARE_PROVIDER_SITE_OTHER): Payer: Self-pay | Admitting: Student in an Organized Health Care Education/Training Program

## 2016-07-07 MED ORDER — PRAVASTATIN SODIUM 10 MG PO TABS
10.0000 mg | ORAL_TABLET | Freq: Every day | ORAL | 2 refills | Status: DC
Start: 2016-07-07 — End: 2016-11-16

## 2016-07-07 NOTE — Telephone Encounter (Signed)
I didn't see cholesterol medicine under med list si she has to take med for hyperlipidemia

## 2016-07-07 NOTE — Telephone Encounter (Signed)
Pt request Rx for cholesterol be sent to    [facility]  [address] [place], [place], [place] [address] [phone]    Since she is now out of town for a while caring for her mother

## 2016-10-16 ENCOUNTER — Other Ambulatory Visit (INDEPENDENT_AMBULATORY_CARE_PROVIDER_SITE_OTHER): Payer: Self-pay | Admitting: Student in an Organized Health Care Education/Training Program

## 2016-10-16 ENCOUNTER — Encounter (INDEPENDENT_AMBULATORY_CARE_PROVIDER_SITE_OTHER): Payer: Self-pay | Admitting: Student in an Organized Health Care Education/Training Program

## 2016-10-16 MED ORDER — LISINOPRIL-HYDROCHLOROTHIAZIDE 10-12.5 MG PO TABS
1.0000 | ORAL_TABLET | Freq: Every day | ORAL | 1 refills | Status: DC
Start: 2016-10-16 — End: 2016-11-16

## 2016-11-13 ENCOUNTER — Ambulatory Visit (FREE_STANDING_LABORATORY_FACILITY)
Payer: No Typology Code available for payment source | Admitting: Student in an Organized Health Care Education/Training Program

## 2016-11-13 ENCOUNTER — Encounter (INDEPENDENT_AMBULATORY_CARE_PROVIDER_SITE_OTHER): Payer: Self-pay | Admitting: Student in an Organized Health Care Education/Training Program

## 2016-11-13 VITALS — BP 146/82 | HR 73 | Temp 97.6°F | Resp 20 | Wt 181.8 lb

## 2016-11-13 DIAGNOSIS — I10 Essential (primary) hypertension: Secondary | ICD-10-CM

## 2016-11-13 DIAGNOSIS — E782 Mixed hyperlipidemia: Secondary | ICD-10-CM

## 2016-11-13 DIAGNOSIS — R739 Hyperglycemia, unspecified: Secondary | ICD-10-CM

## 2016-11-13 DIAGNOSIS — Z23 Encounter for immunization: Secondary | ICD-10-CM

## 2016-11-13 LAB — COMPREHENSIVE METABOLIC PANEL
ALT: 16 U/L (ref 0–55)
AST (SGOT): 20 U/L (ref 5–34)
Albumin/Globulin Ratio: 1.2 (ref 0.9–2.2)
Albumin: 4 g/dL (ref 3.5–5.0)
Alkaline Phosphatase: 82 U/L (ref 37–106)
BUN: 13 mg/dL (ref 7.0–19.0)
Bilirubin, Total: 0.3 mg/dL (ref 0.1–1.2)
CO2: 27 mEq/L (ref 21–29)
Calcium: 9.3 mg/dL (ref 8.5–10.5)
Chloride: 104 mEq/L (ref 100–111)
Creatinine: 0.7 mg/dL (ref 0.4–1.5)
Globulin: 3.4 g/dL (ref 2.0–3.7)
Glucose: 95 mg/dL (ref 70–100)
Potassium: 4 mEq/L (ref 3.5–5.1)
Protein, Total: 7.4 g/dL (ref 6.0–8.3)
Sodium: 139 mEq/L (ref 136–145)

## 2016-11-13 LAB — HEMOLYSIS INDEX: Hemolysis Index: 4 (ref 0–18)

## 2016-11-13 LAB — HEMOGLOBIN A1C
Average Estimated Glucose: 116.9 mg/dL
Hemoglobin A1C: 5.7 % (ref 4.6–5.9)

## 2016-11-13 LAB — LIPID PANEL
Cholesterol / HDL Ratio: 4.7
Cholesterol: 239 mg/dL — ABNORMAL HIGH (ref 0–199)
HDL: 51 mg/dL (ref 40–9999)
LDL Calculated: 171 mg/dL — ABNORMAL HIGH (ref 0–99)
Triglycerides: 84 mg/dL (ref 34–149)
VLDL Calculated: 17 mg/dL (ref 10–40)

## 2016-11-13 LAB — GFR: EGFR: >60

## 2016-11-13 LAB — CK: Creatine Kinase (CK): 282 U/L — ABNORMAL HIGH (ref 29–168)

## 2016-11-13 NOTE — Patient Instructions (Addendum)
Co-Enzyme Q-10 Oral capsule 400 daily   What is this medicine?  CO-ENZYME Q10 (koh EN zahym Q10) is an herbal or dietary supplement. It is promoted to help many disorders including congestive heart failure, certain mitochondrial diseases, migraine, and high blood pressure. The FDA has not approved this supplement for any medical use.  This supplement may be used for other purposes; ask your health care provider or pharmacist if you have questions.  What should I tell my health care provider before I take this medicine?  They need to know if you have any of these conditions:   an unusual or allergic reaction to co-enzyme Q10, other herbs, plants, medicines, foods, dyes, or preservatives   pregnant or trying to get pregnant   breast-feeding  How should I use this medicine?  Take this medicine by mouth with a glass of water. Follow the directions on the package labeling, or take as directed by your health care professional. You should take this medicine with a high-fat meal. Do not take this medicine more often than directed.  Contact your pediatrician regarding the use of this medicine in children. Special care may be needed.  Overdosage: If you think you've taken too much of this medicine contact a poison control center or emergency room at once.  NOTE: This medicine is only for you. Do not share this medicine with others.  What if I miss a dose?  If you miss a dose, take it as soon as you can. If it is almost time for your next dose, take only that dose. Do not take double or extra doses.  What may interact with this medicine?   medicines for blood pressure   warfarin  This list may not describe all possible interactions. Give your health care provider a list of all the medicines, herbs, non-prescription drugs, or dietary supplements you use. Also tell them if you smoke, drink alcohol, or use illegal drugs. Some items may interact with your medicine.  What should I watch for while using this medicine?  See your  doctor if your symptoms do not get better or if they get worse.  Herbal or dietary supplements are not regulated like medicines. Rigid quality control standards are not required for dietary supplements. The purity and strength of these products can vary. The safety and effect of this dietary supplement for a certain disease or illness is not well known. This product is not intended to diagnose, treat, cure or prevent any disease.  The Food and Drug Administration suggests the following to help consumers protect themselves:   Always read product labels and follow directions.   Natural does not mean a product is safe for humans to take.   Look for products that include USP after the ingredient name. This means that the manufacturer followed the standards of the [place] Pharmacopoeia.   Supplements made or sold by a nationally known food or drug company are more likely to be made under tight controls. You can write to the company for more information about how the product was made.  What side effects may I notice from receiving this medicine?  Side effects that you should report to your doctor or health care professional as soon as possible:   allergic reactions like skin rash, itching or hives, swelling of the face, lips, or tongue  Side effects that usually do not require medical attention (Report these to your doctor or health care professional if they continue or are bothersome.):   diarrhea  loss of appetite   nausea   trouble sleeping   upset stomach  This list may not describe all possible side effects. Call your doctor for medical advice about side effects. You may report side effects to FDA at 1-800-FDA-1088.  Where should I keep my medicine?  Keep out of the reach of children.  Store at room temperature or as directed on the package label. Throw away any unused medicine after the expiration date.  NOTE:This sheet is a summary. It may not cover all possible information. If you have questions about this  medicine, talk to your doctor, pharmacist, or health care provider. Copyright 2015 Gold Standard      Co-Enzyme Q-10 Oral capsule  What is this medicine?  CO-ENZYME Q10 (koh EN zahym Q10) is an herbal or dietary supplement. It is promoted to help many disorders including congestive heart failure, certain mitochondrial diseases, migraine, and high blood pressure. The FDA has not approved this supplement for any medical use.  This supplement may be used for other purposes; ask your health care provider or pharmacist if you have questions.  What should I tell my health care provider before I take this medicine?  They need to know if you have any of these conditions:   an unusual or allergic reaction to co-enzyme Q10, other herbs, plants, medicines, foods, dyes, or preservatives   pregnant or trying to get pregnant   breast-feeding  How should I use this medicine?  Take this medicine by mouth with a glass of water. Follow the directions on the package labeling, or take as directed by your health care professional. You should take this medicine with a high-fat meal. Do not take this medicine more often than directed.  Contact your pediatrician regarding the use of this medicine in children. Special care may be needed.  Overdosage: If you think you've taken too much of this medicine contact a poison control center or emergency room at once.  NOTE: This medicine is only for you. Do not share this medicine with others.  What if I miss a dose?  If you miss a dose, take it as soon as you can. If it is almost time for your next dose, take only that dose. Do not take double or extra doses.  What may interact with this medicine?   medicines for blood pressure   warfarin  This list may not describe all possible interactions. Give your health care provider a list of all the medicines, herbs, non-prescription drugs, or dietary supplements you use. Also tell them if you smoke, drink alcohol, or use illegal drugs. Some items  may interact with your medicine.  What should I watch for while using this medicine?  See your doctor if your symptoms do not get better or if they get worse.  Herbal or dietary supplements are not regulated like medicines. Rigid quality control standards are not required for dietary supplements. The purity and strength of these products can vary. The safety and effect of this dietary supplement for a certain disease or illness is not well known. This product is not intended to diagnose, treat, cure or prevent any disease.  The Food and Drug Administration suggests the following to help consumers protect themselves:   Always read product labels and follow directions.   Natural does not mean a product is safe for humans to take.   Look for products that include USP after the ingredient name. This means that the manufacturer followed the standards of the [place]  Pharmacopoeia.   Supplements made or sold by a nationally known food or drug company are more likely to be made under tight controls. You can write to the company for more information about how the product was made.  What side effects may I notice from receiving this medicine?  Side effects that you should report to your doctor or health care professional as soon as possible:   allergic reactions like skin rash, itching or hives, swelling of the face, lips, or tongue  Side effects that usually do not require medical attention (Report these to your doctor or health care professional if they continue or are bothersome.):   diarrhea   loss of appetite   nausea   trouble sleeping   upset stomach  This list may not describe all possible side effects. Call your doctor for medical advice about side effects. You may report side effects to FDA at 1-800-FDA-1088.  Where should I keep my medicine?  Keep out of the reach of children.  Store at room temperature or as directed on the package label. Throw away any unused medicine after the expiration date.  NOTE:This  sheet is a summary. It may not cover all possible information. If you have questions about this medicine, talk to your doctor, pharmacist, or health care provider. Copyright 2015 Gold Standard        Increase your lisinopril-hydroCHLOROthiazide (PRINZIDE,ZESTORETIC) 10-12.5 MG per tablet to 2 tablets daily , check your Blood pressure and follow up in 3 months     Stop paravstatin for at least 3 months , follow up and re-evaluate

## 2016-11-13 NOTE — Progress Notes (Signed)
Chief Complaint   Patient presents with   . Hypertension     pt here for f/u also c/o elevated BP    . Pre-diabetes     pt would like to do BW due to gain weight    . Hyperlipidemia   . Leg Pain     R Leg pain after she start to take  lipitor          Hypertension: very  compliant  WITH ZESTORetc 10-12.5   - The patient feels this issue is yet to be controlled.  - The patient has been monitoring blood pressure.  - The patient has  been adhering to the recommended no-added-salt diet.  - The patient has not been compliant to the recommended exercise program.  - The patient is  tolerating the current medication regimen well.  - The patient denies orthostasis.  - The patient denies chest pain.  - The patient denies dyspnea.  - The patient denies edema.      Hx HLD  Compliant with cholesterol medication but thought that her Lipitor 10 caused her R leg pain from the R buttock down her R thigh, stopped it and felt better, not completely resolved on pravastatin    Not Compliant with low saturated and low carbohydrate Diet  No hx abnormal liver test related to statin therapy     She has h/o prediabetes and would like to check       Needs hep B vAC #2           Vitals:    11/13/16 0950 11/13/16 0951   BP: 144/83 146/82   Pulse: 73    Resp: 20    Temp: 97.6 F (36.4 C)    SpO2: 98%    Weight: 82.5 kg (181 lb 12.8 oz)        Patient Active Problem List   Diagnosis   . Fuchs' corneal dystrophy   . Status post corneal transplant   . Pseudophakia of left eye   . Nuclear sclerosis of right eye   . Dry eyes, bilateral   . Blepharitis of both eyes   . post DSEK, OS   . Refractive error   . Corneal graft rejection   . ERM OS (epiretinal membrane, left eye)   . PVD (posterior vitreous detachment)   . OC (onychocryptosis)   . Acquired keratoderma   . Sciatica associated with disorder of lumbosacral spine   . Acute pain of left knee       Past Medical History:   Diagnosis Date   . Brain tumor (benign)    . Hypercholesteremia    .  Hypertensive disorder        Past Surgical History:   Procedure Laterality Date   . BRAIN SURGERY  2009   . BREAST BIOPSY Right 2008    right breast biopsy- benign   . BREAST BIOPSY Right 1970s    right breast tumor removed 40+ years ago - benign   . BREAST CYST EXCISION  1970s    right breast tumor removed 40+ years ago - benign   . CESAREAN SECTION     . CORNEAL TRANSPLANT     . EYE SURGERY     . TUBAL LIGATION         Social History     Social History   . Marital status: Divorced     Spouse name: N/A   . Number of children: N/A   . Years of education:  N/A     Occupational History   . Not on file.     Social History Main Topics   . Smoking status: Never Smoker   . Smokeless tobacco: Never Used   . Alcohol use No   . Drug use: No   . Sexual activity: Not on file     Other Topics Concern   . Not on file     Social History Narrative   . No narrative on file       Family History   Problem Relation Age of Onset   . Diabetes Mother    . Hypertension Mother    . Diabetes Father    . Hypertension Father    . Breast cancer Neg Hx        Allergies   Allergen Reactions   . Atorvastatin      Joint pain    . Crestor [Rosuvastatin]      Throat swelling after Lisinopril was added, previous P[CP Stopped Crestor and Pt has been doing fine    . Iodine    . Penicillins    . Tetanus Immune Globulin      Arm swelling    . Zocor [Simvastatin]      No response        Current Outpatient Prescriptions   Medication Sig Dispense Refill   . difluprednate (DUREZOL) 0.05 % Emulsion ophthalmic solution Place 1 drop into the left eye daily.      Marland Kitchen FA-B6-B12-IFC-ALPHA LIPOIC ACD PO Take by mouth.     Marland Kitchen lisinopril-hydroCHLOROthiazide (PRINZIDE,ZESTORETIC) 10-12.5 MG per tablet Take 1 tablet by mouth daily. 30 tablet 1   . Multiple Vitamin (MULTIVITAMIN) tablet Take 1 tablet by mouth daily.     . pravastatin (PRAVACHOL) 10 MG tablet Take 1 tablet (10 mg total) by mouth daily. 30 tablet 2     No current facility-administered medications for this  visit.        Constitutional: negative for fatigue, fever, chills   Eyes: negative for vision problems   Ears, nose, mouth, throat, and face: negative for nose bleeds, nasal congestion   Respiratory: negative for SOB, cough , wheezing   Cardiovascular: negative for CP, dizziness, palpitations, swelling , orthopnea   Gastrointestinal: negative for diarrhea , nausea, vomiting, constipation   Genitourinary:negative for dysuria, frequency and hematuria   Integument/breast: negative for nodule   Hematologic/lymphatic: negative for enlarged LAD,   Musculoskeletal: R leg pain   Neurological: negative for HA, dizziness, tingling ,   Behavioral/Psych: negative for anxiety, depression    Other ROS were reviewed and were neg       Vitals:    11/13/16 0951   BP: 146/82   Pulse:    Resp:    Temp:    SpO2:          CONSTITUTIONAL Patient is afebrile, Vital signs reviewed. Hypertensive.   HEAD Atraumatic, Normocephallc.   EYES Eyes are normal to inspection, PERRL, No discharge from eyes,   ENT Ears normal to inspection, Nose examination normal, Posterior pharynx normal.   NECK Normal ROM, No jugular venous distention, No meningeal signs.   RESPIRATORY CHEST Chest is nontender, Breath sounds normal.   CARDIOVASCULAR RRR, Heart sounds normal, Normal S1 S2.   ABDOMEN Abdomen is nontender, No pulsatile masses, No other masses,   Bowel sounds normal, No distension, No peritoneal signs.   BACK There is no CVA Tenderness, There is no tenderness to palpation.   UPPER EXTREMITY Inspection normal, No  cyanosis.   LOWER EXTREMITY Inspection normal, No cyanosis.   NEURO Cranial nerves intact, No cerebellar deficits, Normal DTRs, Babinski absent, Speech normal, Memory normal   SKIN Skin is warm, Skin is dry, Skin is normal color   LYMPHATIC No adenopathy in neck.   PSYCHIATRIC Oriented X 3, Normal affect. Normal insight.      A/P    1. Essential hypertension  Hemoglobin A1C    Comprehensive metabolic panel    Lipid panel    CK   2. Mixed  hyperlipidemia  Hemoglobin A1C    Comprehensive metabolic panel    Lipid panel    CK   3. Need for hepatitis B vaccination  Hemoglobin A1C    Comprehensive metabolic panel    Lipid panel    CK    Hepatitis B vaccine adult IM   4. Elevated blood sugar  Hemoglobin A1C    Comprehensive metabolic panel    Lipid panel    CK       Increase your lisinopril-hydroCHLOROthiazide (PRINZIDE,ZESTORETIC) 10-12.5 MG per tablet to 2 tablets daily , check your Blood pressure and follow up in 3 months     Stop paravstatin for at least 3 months , follow up and re-evaluate /labs then    inc Q enz 10 to 400         Lifestyle Modifications for high blood pressure     Modification       Recommendation   Approximate SBP** Reduction (Range)++     Weight reduction   Maintain normal body weight (body mass index 18.5-24.9 kg/m2)     5-20 mm Hg/10kg     Adopt DASH eating plan     Consume a diet rich in fruits, vegetables, and low fat dairy products with a reduced content of saturated and total fat     8-14 mm Hg     Dietary sodium reduction     Reduce dietary sodium intake to no more than 100 mmol per day (2.4g sodium or 6g sodium chloride)     2-8 mm Hg     Physical Activity   Engage in regular aerobic physical activity such as brisk walking (at least 30 min per day, most days of the week which may be  broken into shorter time intervals such as 10 minutes each of moderate or vigorous effort)     4-9 mm Hg     Moderation of alcohol consumption   Limit consumption to no more than 2 drinks (e.g. 24 oz. beer, 10 oz. wine, or 3 oz. 80-proof whiskey) per day in most men, and to no more than 1 drink per day in women and lighter weight persons     2-4 mm Hg   **SBP - systolic blood pressure  ++The effects of implementing these modifications are dose and time dependent, and could be greater for some individuals  +++DASH - Dietary Approaches to Stop Hypertension   Centers for Disease Control and Prevention.  Protocol for Controlling Hypertension in  Adults.  [place], [place].  2013.        Patient counseled to watch concentrated sweets and carbohydrates. Patient also counseled to exercise at least three times a week. Patient counseled about routine foot care  Patient counseled to eat a low fat, low cholesterol diet and to exercise at least three times a week for at least 30 minutes.  Patient counseled to eat a low salt diet(DASH diet)  Followup in three months

## 2016-11-13 NOTE — Progress Notes (Signed)
Have you seen any specialists/other providers since your last visit with [place]?      No      Arm preference verified?     Yes    The patient is due for colonoscopy, shingles vaccine and [facility]   Patient presented to the office for Hep B  administration.  Received injection in the Left arm.  No reaction was noted and patient left in good condition.

## 2016-11-14 ENCOUNTER — Emergency Department
Admission: EM | Admit: 2016-11-14 | Discharge: 2016-11-14 | Disposition: A | Discharge status: Home / Self Care | Payer: No Typology Code available for payment source | Attending: Student in an Organized Health Care Education/Training Program | Admitting: Student in an Organized Health Care Education/Training Program

## 2016-11-14 DIAGNOSIS — I10 Essential (primary) hypertension: Secondary | ICD-10-CM | POA: Insufficient documentation

## 2016-11-14 DIAGNOSIS — Z947 Corneal transplant status: Secondary | ICD-10-CM | POA: Insufficient documentation

## 2016-11-14 DIAGNOSIS — S0990XA Unspecified injury of head, initial encounter: Secondary | ICD-10-CM

## 2016-11-14 DIAGNOSIS — E7800 Pure hypercholesterolemia, unspecified: Secondary | ICD-10-CM | POA: Insufficient documentation

## 2016-11-14 DIAGNOSIS — Z79899 Other long term (current) drug therapy: Secondary | ICD-10-CM | POA: Insufficient documentation

## 2016-11-14 MED ORDER — IBUPROFEN 800 MG PO TABS
800.0000 mg | ORAL_TABLET | Freq: Once | ORAL | Status: AC
Start: 2016-11-14 — End: 2016-11-14
  Administered 2016-11-14: 22:00:00 800 mg via ORAL
  Filled 2016-11-14: qty 1
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
## 2016-11-16 ENCOUNTER — Ambulatory Visit (INDEPENDENT_AMBULATORY_CARE_PROVIDER_SITE_OTHER)
Payer: No Typology Code available for payment source | Admitting: Student in an Organized Health Care Education/Training Program

## 2016-11-16 ENCOUNTER — Encounter (INDEPENDENT_AMBULATORY_CARE_PROVIDER_SITE_OTHER): Payer: Self-pay | Admitting: Student in an Organized Health Care Education/Training Program

## 2016-11-16 VITALS — BP 132/83 | HR 78 | Temp 98.0°F | Resp 20 | Wt 179.8 lb

## 2016-11-16 DIAGNOSIS — S134XXA Sprain of ligaments of cervical spine, initial encounter: Secondary | ICD-10-CM

## 2016-11-16 DIAGNOSIS — E782 Mixed hyperlipidemia: Secondary | ICD-10-CM

## 2016-11-16 DIAGNOSIS — I10 Essential (primary) hypertension: Secondary | ICD-10-CM

## 2016-11-16 DIAGNOSIS — S139XXA Sprain of joints and ligaments of unspecified parts of neck, initial encounter: Secondary | ICD-10-CM

## 2016-11-16 MED ORDER — CYCLOBENZAPRINE HCL 10 MG PO TABS
10.0000 mg | ORAL_TABLET | Freq: Every evening | ORAL | 1 refills | Status: DC
Start: 2016-11-16 — End: 2017-04-18

## 2016-11-16 MED ORDER — LISINOPRIL-HYDROCHLOROTHIAZIDE 10-12.5 MG PO TABS
1.0000 | ORAL_TABLET | Freq: Every day | ORAL | 1 refills | Status: DC
Start: 2016-11-16 — End: 2017-03-20

## 2016-11-16 MED ORDER — EZETIMIBE 10 MG PO TABS
10.0000 mg | ORAL_TABLET | Freq: Every day | ORAL | 3 refills | Status: DC
Start: 2016-11-16 — End: 2017-06-04

## 2016-11-16 NOTE — Patient Instructions (Signed)
Understanding Neck Problems  If you suffer from neck pain, you're not alone. Many people have neck pain at some point in their lives. Problems such as poor posture, injury, and wear and tear can lead to neck pain. Your healthcare provider will work with you to find the treatment that's best for your neck.  Types of neck problems    The following problems can cause pain or injury in your neck:   Strains and sprains. Strains (stretched or torn muscles) and sprains (stretched or torn ligaments) can cause neck pain. Strains and sprains can happen during an accident, or when you overuse your neck through repetitive motion. They can also cause your muscles and ligaments to become inflamed (swollen and painful).   Whiplash and other injuries. Whiplash can result when an impact throwsyour head, forcingyour neck too far forward, then too far backward. When combined, the two motions can cause a painful injury to different parts of your neck, such as muscles, ligaments, or joints. The most common cause of whiplash is a car accident. But it can also happen during a fall or sports injury.   Weakened disks. A simple action, such as a sneeze or a cough, can cause one of your disks to bulge orrupture (herniate). A herniated disk can put pressure on your nerve and cause pain. Over time, disks can also thin out (degenerate). Flattened disks don't cushion vertebrae well and can cause vertebrae to rub together. Also, there is less space for the nerves. This can pinch nerves and cause pain.   Weakened joints. Aging and injury can cause joints to slowly degenerate. Thinned joints can also cause vertebrae to rub together. This can cause abnormal growths of bone (bone spurs) to form on vertebrae. Bone spurs put pressure on nerves, causing pain.  Common symptoms  If you have a neck problem, you may have one or more of the following symptoms:   Muscle tension and spasm. You may not be able to move your neck, arms, or shoulders  comfortably if you have muscle tension or stiffness in your neck. If your symptoms aren't relieved, you may experience muscle spasms, or knots of contracted tissue (trigger points) in areas of your neck and shoulders.   Aches and pains. Dull aches in your head or neck, sharp pains, and swelling of the soft tissue of your neck and shoulders are common symptoms. If there's pressure on the nerves in your neck, you may feel pain in your arms or hands.   Numbness or weakness. If you injure the nerves in your neck, you may have numbness, tingling, or weakness in your shoulders, arms, or hands. These symptoms arise when disks or bone spurs press on the nerves in your neck. Severe disease can also affect your legs.  Date Last Reviewed: 01/23/2016   2000-2017 [employer] [address], [place] [address]. All rights reserved. This information is not intended as a substitute for professional medical care. Always follow your healthcare professional's instructions.
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
## 2016-11-16 NOTE — Progress Notes (Signed)
Have you seen any specialists/other providers since your last visit with us?    No    Arm preference verified?   Yes    The patient is due for colonoscopy, shingles vaccine and [facility]

## 2016-11-23 ENCOUNTER — Other Ambulatory Visit (INDEPENDENT_AMBULATORY_CARE_PROVIDER_SITE_OTHER): Payer: Self-pay | Admitting: Student in an Organized Health Care Education/Training Program

## 2016-11-23 DIAGNOSIS — M25551 Pain in right hip: Secondary | ICD-10-CM

## 2016-11-23 DIAGNOSIS — S139XXA Sprain of joints and ligaments of unspecified parts of neck, initial encounter: Secondary | ICD-10-CM

## 2016-12-04 ENCOUNTER — Encounter (INDEPENDENT_AMBULATORY_CARE_PROVIDER_SITE_OTHER): Payer: Self-pay | Admitting: Student in an Organized Health Care Education/Training Program

## 2016-12-20 ENCOUNTER — Encounter (INDEPENDENT_AMBULATORY_CARE_PROVIDER_SITE_OTHER): Payer: Self-pay | Admitting: Student in an Organized Health Care Education/Training Program

## 2017-03-07 ENCOUNTER — Ambulatory Visit (INDEPENDENT_AMBULATORY_CARE_PROVIDER_SITE_OTHER)
Payer: No Typology Code available for payment source | Admitting: Student in an Organized Health Care Education/Training Program

## 2017-03-20 ENCOUNTER — Encounter (INDEPENDENT_AMBULATORY_CARE_PROVIDER_SITE_OTHER): Payer: Self-pay | Admitting: Internal Medicine

## 2017-03-20 ENCOUNTER — Ambulatory Visit (INDEPENDENT_AMBULATORY_CARE_PROVIDER_SITE_OTHER): Payer: No Typology Code available for payment source | Admitting: Internal Medicine

## 2017-03-20 VITALS — BP 127/80 | HR 82 | Temp 97.6°F | Wt 179.0 lb

## 2017-03-20 DIAGNOSIS — J3489 Other specified disorders of nose and nasal sinuses: Secondary | ICD-10-CM

## 2017-03-20 DIAGNOSIS — R519 Headache, unspecified: Secondary | ICD-10-CM

## 2017-03-20 DIAGNOSIS — Z86018 Personal history of other benign neoplasm: Secondary | ICD-10-CM

## 2017-03-20 DIAGNOSIS — R0789 Other chest pain: Secondary | ICD-10-CM

## 2017-03-20 DIAGNOSIS — R51 Headache: Secondary | ICD-10-CM

## 2017-03-20 MED ORDER — CLARITHROMYCIN 500 MG PO TABS
500.0000 mg | ORAL_TABLET | Freq: Two times a day (BID) | ORAL | 0 refills | Status: DC
Start: 2017-03-20 — End: 2017-04-18

## 2017-03-20 NOTE — Progress Notes (Signed)
Have you seen any specialists/other providers since your last visit with us?    No    Arm preference verified?   Yes    The patient is due for colonoscopy and shingles vaccine

## 2017-03-20 NOTE — Patient Instructions (Signed)
If any chest pain worsening headache please go to ED    Dr Windell Moment MD  Internist  [facility] [place]  [address], [place],  [place] [id number]  [phone]  [phone]    HTN:  Current blood pressure is at goal. Continue to optimize low salt diet and aerobic exercise efforts. and Continue current medication regimen. Recommend optimizing therapeutic lifestyle changes which include obtaining at least 150 minutes of aerobic exercise per week and eating a heart healthy diet ( i.e. DASH diet - www.heart.org or PopSteam.is ).  Check your blood pressure at home or at the drug store on a regular basis.  Goal is under 140/90.  Call or Email on Mychart about the resullts or Schedule appointment  I counseled her on her elevated BP levels and counseled her that a normal BP is 120/80 and that high blood pressure is considered when there is persistent elevation of BP > 140/90. I outlined the risks of chronically elevated BP in the long term to include higher risk of CVA and MI. I discussed treatment options to include salt avoidance, weight loss, regular aerobic exercise, avoidance of heavy use of alcohol, tobacco cessation and medication treatment if appropriate. At this time I recommended   Increase vegetables, fruits, and fiber in your diet.  Decrease sodium in your diet (less than 2000mg  daily).  Decrease eating out.  Limit caffeine and alcohol intake.    Exercise at least 30 minutes 5 days per week.  Work on decreasing stress in your life in whatever way makes sense for you.

## 2017-03-20 NOTE — Progress Notes (Signed)
Chief Complaint   Patient presents with   . Headache     x1wk some relief with Allegra D   . Nausea     since Monday   . Hypertension     been slightly elevated with heart palpitations starting last PM     She is not my regular patient  Hx HTN on lisinopril and hctz , she  Reports Headache for > a week, left sided like a pressure worse when she gets up from lying down and also looking down   which she describes  It as similar pain when she had her meninigioma which was resected an drequesting another MRI brain  Not  associated with Nausea or vomiting,  No triggering  Agent, not associate dwith photophobia or phonophobia .    she denies any ear pain, discharge, ringing sensation or hearing loss  no recent URTI, no diplopia, no blurred vision, no altered speech, no paresthesia, weakness, Tremors or shakiness, no unsteady gait no chest pain, palpitations, DOE.  No  Hx recent head injury.   She did check BP last 2 days an dhas been 160s.90s and took 2 pills of her BP meds, last night  Its at goal here today manually both arm  t, No Hx migraine     Also when questioned reports left arm spasm like sensation and some intermittent Chest pressure  EKG NSR  Advised Ed if worse or concerning      Past Medical History:   Diagnosis Date   . Brain tumor (benign)    . Hypercholesteremia    . Hypertensive disorder       Past Surgical History:   Procedure Laterality Date   . BRAIN SURGERY  2009   . BREAST BIOPSY Right 2008    right breast biopsy- benign   . BREAST BIOPSY Right 1970s    right breast tumor removed 40+ years ago - benign   . BREAST CYST EXCISION  1970s    right breast tumor removed 40+ years ago - benign   . CESAREAN SECTION     . CORNEAL TRANSPLANT     . EYE SURGERY     . TUBAL LIGATION          (Not in a hospital admission)  Current/Home Medications    CO-ENZYME Q-10 50 MG CAPSULE    Take 50 mg by mouth daily.    CYCLOBENZAPRINE (FLEXERIL) 10 MG TABLET    Take 1 tablet (10 mg total) by mouth nightly.    DIFLUPREDNATE  (DUREZOL) 0.05 % EMULSION OPHTHALMIC SOLUTION    Place 1 drop into the left eye daily.     EZETIMIBE (ZETIA) 10 MG TABLET    Take 1 tablet (10 mg total) by mouth daily.    FA-B6-B12-IFC-ALPHA LIPOIC ACD PO    Take by mouth.    MULTIPLE VITAMIN (MULTIVITAMIN) TABLET    Take 1 tablet by mouth daily.     Allergies   Allergen Reactions   . Atorvastatin      Joint pain    . Crestor [Rosuvastatin]      Throat swelling after Lisinopril was added, previous P[CP Stopped Crestor and Pt has been doing fine    . Iodine    . Penicillins    . Tetanus Immune Globulin      Arm swelling    . Zocor [Simvastatin]      No response       Social History   Substance Use Topics   .  Smoking status: Never Smoker   . Smokeless tobacco: Never Used   . Alcohol use No      Family History   Problem Relation Age of Onset   . Diabetes Mother    . Hypertension Mother    . Diabetes Father    . Hypertension Father    . Breast cancer Neg Hx           Review of Systems - General ROS: negative for - chills, fatigue, fever or malaise  Ophthalmic ROS: negative for - blurry vision, double vision, loss of vision or scotomata  ENT ROS: negative for - hearing change, nasal congestion, nasal discharge, sinus pain, sneezing, sore throat, vertigo, visual changes or vocal changes   Respiratory ROS: no cough, shortness of breath, or wheezing  Cardiovascular ROS: no chest pain or dyspnea on exertion  negative for - edema, irregular heartbeat, palpitations or paroxysmal nocturnal dyspnea  Neurological ROS: positive for - headaches  negative for - confusion, dizziness, gait disturbance, numbness/tingling, tremors, visual changes or weakness    BP 127/80 (BP Site: Right arm)   Pulse 82   Temp 97.6 F (36.4 C) (Oral)   Wt 81.2 kg (179 lb)   SpO2 95%   BMI 31.21 kg/m   Wt Readings from Last 3 Encounters:   03/20/17 81.2 kg (179 lb)   11/16/16 81.6 kg (179 lb 12.8 oz)   11/14/16 81.2 kg (179 lb)         Mental status - alert, oriented to person, place, and  time  Physical Examination: Eyes - pupils equal and reactive, extraocular eye movements intact   Stye left upper eyelid   Ears - bilateral TM's and external ear canals normal  Nose - normal and patent, no erythema, discharge or polyps and sinus tenderness reported B/L frontal   Mouth - mucous membranes moist, pharynx normal without lesions    Neck - supple, no cervical  Adenopathy, no carotid bruit  Chest - clear to auscultation, no wheezes, rales or rhonchi, symmetric air entry  Heart - normal rate, regular rhythm, normal S1, S2, no murmurs, rubs, clicks or gallops  Abdomen - soft, nontender, nondistended, no masses or organomegaly  Neurological - alert, oriented, normal speech, no focal findings or movement disorder noted, cDTR's normal and symmetric, motor and sensory grossly normal bilaterally, t, no tremors  Extremities - dorsalis pedis  pulses normal, no pedal edema, no clubbing or cyanosis        Assessment/Plan:      1. Nonintractable headache, unspecified chronicity pattern, unspecified headache type  - MRI Brain W WO Contrast  - clarithromycin (BIAXIN) 500 MG tablet; Take 1 tablet (500 mg total) by mouth 2 (two) times daily.  Dispense: 20 tablet; Refill: 0    2. History of meningioma    - MRI Brain W WO Contrast    3. Chest tightness    - ECG 12 lead    4. Sinus pressure    - clarithromycin (BIAXIN) 500 MG tablet; Take 1 tablet (500 mg total) by mouth 2 (two) times daily.  Dispense: 20 tablet; Refill: 0    Body mass index is 31.21 kg/m.        Dr Windell Moment MD  Internist  [facility] [place]  [address], [place],  [id number]  [phone]  Fax-[phone]

## 2017-03-21 ENCOUNTER — Ambulatory Visit (INDEPENDENT_AMBULATORY_CARE_PROVIDER_SITE_OTHER)
Payer: No Typology Code available for payment source | Admitting: Student in an Organized Health Care Education/Training Program

## 2017-03-22 ENCOUNTER — Ambulatory Visit
Admission: RE | Admit: 2017-03-22 | Discharge: 2017-03-22 | Disposition: A | Discharge status: Home / Self Care | Payer: No Typology Code available for payment source | Source: Ambulatory Visit | Attending: Internal Medicine | Admitting: Internal Medicine

## 2017-03-22 DIAGNOSIS — R51 Headache: Secondary | ICD-10-CM | POA: Insufficient documentation

## 2017-03-22 DIAGNOSIS — Z9889 Other specified postprocedural states: Secondary | ICD-10-CM | POA: Insufficient documentation

## 2017-03-22 DIAGNOSIS — Z86011 Personal history of benign neoplasm of the brain: Secondary | ICD-10-CM | POA: Insufficient documentation

## 2017-03-22 MED ORDER — GADOBUTROL 1 MMOL/ML IV SOLN
8.0000 mL | Freq: Once | INTRAVENOUS | Status: AC | PRN
Start: 2017-03-22 — End: 2017-03-22
  Administered 2017-03-22: 20:00:00 8 mmol via INTRAVENOUS
  Filled 2017-03-22: qty 10

## 2017-03-23 ENCOUNTER — Telehealth (INDEPENDENT_AMBULATORY_CARE_PROVIDER_SITE_OTHER): Payer: Self-pay | Admitting: Internal Medicine

## 2017-03-23 NOTE — Telephone Encounter (Signed)
Discussed Mri results , normal, sinus, no new mass     , and she appeared to understand

## 2017-03-28 ENCOUNTER — Other Ambulatory Visit: Payer: Self-pay

## 2017-04-05 ENCOUNTER — Encounter (INDEPENDENT_AMBULATORY_CARE_PROVIDER_SITE_OTHER): Payer: Self-pay | Admitting: Internal Medicine

## 2017-04-13 ENCOUNTER — Telehealth (INDEPENDENT_AMBULATORY_CARE_PROVIDER_SITE_OTHER): Payer: Self-pay | Admitting: Student in an Organized Health Care Education/Training Program

## 2017-04-13 NOTE — Telephone Encounter (Signed)
Pt is requesting a mammorgram order and she was told by pcp that she needed to be seen for a breast exam. Pt would like to come next Thursday or Friday due to her being out of town in [place]. Please advise, pt can best be reached at [phone].

## 2017-04-16 NOTE — Telephone Encounter (Signed)
I have never done a breast exam on her   She needs to have an exam prior to ordering     Thanks     [employer] Itai Barbian

## 2017-04-16 NOTE — Telephone Encounter (Signed)
Can we see her on Wednesday

## 2017-04-16 NOTE — Telephone Encounter (Signed)
She has h/o breast biopsies and was seeing Kristopher Glee, MD    Thanks   Jacqualyn Posey Ivonna Kinnick

## 2017-04-16 NOTE — Telephone Encounter (Signed)
Please review

## 2017-04-16 NOTE — Telephone Encounter (Signed)
Left v/m call back

## 2017-04-16 NOTE — Telephone Encounter (Signed)
Patient does not find necessary to make an appt just to get a mammogram order- stated she  is only asking to  get her yearly mammogram order ..she can come to pick it up Wednesday --    Thank you

## 2017-04-18 ENCOUNTER — Encounter (INDEPENDENT_AMBULATORY_CARE_PROVIDER_SITE_OTHER): Payer: Self-pay | Admitting: Student in an Organized Health Care Education/Training Program

## 2017-04-18 ENCOUNTER — Ambulatory Visit (INDEPENDENT_AMBULATORY_CARE_PROVIDER_SITE_OTHER)
Payer: No Typology Code available for payment source | Admitting: Student in an Organized Health Care Education/Training Program

## 2017-04-18 VITALS — BP 125/83 | HR 76 | Temp 97.4°F | Resp 12 | Ht 64.0 in | Wt 183.8 lb

## 2017-04-18 DIAGNOSIS — G43009 Migraine without aura, not intractable, without status migrainosus: Secondary | ICD-10-CM

## 2017-04-18 DIAGNOSIS — Z23 Encounter for immunization: Secondary | ICD-10-CM

## 2017-04-18 DIAGNOSIS — Z1231 Encounter for screening mammogram for malignant neoplasm of breast: Secondary | ICD-10-CM

## 2017-04-18 DIAGNOSIS — Z1239 Encounter for other screening for malignant neoplasm of breast: Secondary | ICD-10-CM

## 2017-04-18 MED ORDER — TOPIRAMATE 25 MG PO TABS
ORAL_TABLET | ORAL | 0 refills | Status: DC
Start: 2017-04-18 — End: 2017-06-04

## 2017-04-18 NOTE — Telephone Encounter (Signed)
Can we squeeze her in today please advise

## 2017-04-18 NOTE — Telephone Encounter (Signed)
She can come in today with waiting time     Wendy [name] Khatib Wendy [name]

## 2017-04-18 NOTE — Telephone Encounter (Signed)
Pt informed we don't have any available she made an appointment around march for physical and breast exam she said she has to go to [place] so why she is trying to come this week but she is ok to wait until March

## 2017-04-18 NOTE — Telephone Encounter (Signed)
Patient is calling to speak with Dr. Salina April or nurse to follow up on getting an appointment this week for a mammogram order; stated she will be out of town next week and needs to seen as soon as possible    Patient would like a call back [phone]

## 2017-04-18 NOTE — Progress Notes (Signed)
Chief Complaint   Patient presents with   . breast exam   . Headache     Pt has head pressure          She continues to have diffuse  Headache for which she describes  It as pressure. She was seen by another provider and treated as sinus with a course of antibiotic which did not hep, MRI : neg , HA is worse with bending and coughing , feels "clogged" Not  associated with Nausea or vomiting,  No triggering  Agent, not associate dwith photophobia or phonophobia .    she denies any ear pain, discharge, ringing sensation or hearing loss  no recent URTI, no diplopia, no blurred vision, no altered speech, no paresthesia, weakness, Tremors or shakiness, no unsteady gait no chest pain, palpitations, DOE.  No  Hx recent head injury.    No Hx of migraine   Mri Brain W Wo Contrast    Result Date: 03/23/2017   1. No MR evidence of acute intracranial abnormality. 2. Stable postsurgical change from posterior left frontal craniotomy without evidence of residual or recurrent meningioma. 3. No change in the appearance of the brain in comparison to the 01/11/2013 exam. Waynard Edwards, MD 03/23/2017 10:17 AM    She used to see gyn and is due for mammogram       Results for orders placed or performed during the hospital encounter of 03/28/16   Mammo Digital Screening Bilateral W CAD    Narrative    CLINICAL HISTORY: 61 year old asymptomatic female status post right  benign breast biopsies.    Craniocaudad and mediolateral oblique views of both breasts were  obtained digitally with computer aided detection.     Comparison has  been made to the prior studies dated 03/25/2015, 02/27/2014, and  12/06/2012.    Parenchymal pattern: There are scattered fibroglandular densities which  could obscure a lesion on mammography.      No significant interval change. Again noted is a localization clip from  a prior needle core biopsy in the right 12:00 anterior depth. No  suspicious dominant masses, clustered microcalcifications, or  unexplained  areas of architectural distortion are evident.  The axillary  regions are unremarkable.      Impression    Stable bilateral benign mammogram.      BIRADS CATEGORY 02-BENIGN FINDING  (2A)    Latanya Maudlin, MD   03/29/2016 9:38 AM           Past Medical History:   Diagnosis Date   . Brain tumor (benign)    . Hypercholesteremia    . Hypertensive disorder       Past Surgical History:   Procedure Laterality Date   . BRAIN SURGERY  2009   . BREAST BIOPSY Right 2008    right breast biopsy- benign   . BREAST BIOPSY Right 1970s    right breast tumor removed 40+ years ago - benign   . BREAST CYST EXCISION  1970s    right breast tumor removed 40+ years ago - benign   . CESAREAN SECTION     . CORNEAL TRANSPLANT     . EYE SURGERY     . TUBAL LIGATION          (Not in a hospital admission)  Current/Home Medications    CO-ENZYME Q-10 50 MG CAPSULE    Take 50 mg by mouth daily.    DIFLUPREDNATE (DUREZOL) 0.05 % EMULSION OPHTHALMIC SOLUTION    Place 1  drop into the left eye daily.     EZETIMIBE (ZETIA) 10 MG TABLET    Take 1 tablet (10 mg total) by mouth daily.    FA-B6-B12-IFC-ALPHA LIPOIC ACD PO    Take by mouth.    MULTIPLE VITAMIN (MULTIVITAMIN) TABLET    Take 1 tablet by mouth daily.     Allergies   Allergen Reactions   . Atorvastatin      Joint pain    . Crestor [Rosuvastatin]      Throat swelling after Lisinopril was added, previous P[CP Stopped Crestor and Pt has been doing fine    . Iodine    . Penicillins    . Tetanus Immune Globulin      Arm swelling    . Zocor [Simvastatin]      No response       Social History   Substance Use Topics   . Smoking status: Never Smoker   . Smokeless tobacco: Never Used   . Alcohol use No      Family History   Problem Relation Age of Onset   . Diabetes Mother    . Hypertension Mother    . Diabetes Father    . Hypertension Father    . Breast cancer Neg Hx           Review of Systems - General ROS: negative for - chills, fatigue, fever or malaise  Ophthalmic ROS: negative for - blurry vision,  double vision, loss of vision or scotomata  ENT ROS: positive for - headaches   Respiratory ROS: no cough, shortness of breath, or wheezing  Cardiovascular ROS: no chest pain or dyspnea on exertion  negative for - edema, irregular heartbeat, palpitations or paroxysmal nocturnal dyspnea  Neurological ROS: no TIA or stroke symptoms  Other ROS were reviewed and were neg       BP 125/83   Pulse 76   Temp 97.4 F (36.3 C)   Resp 12   Ht 1.626 m (5\' 4" )   Wt 83.4 kg (183 lb 12.8 oz)   SpO2 98%   BMI 31.55 kg/m   Wt Readings from Last 3 Encounters:   04/18/17 83.4 kg (183 lb 12.8 oz)   03/20/17 81.2 kg (179 lb)   11/16/16 81.6 kg (179 lb 12.8 oz)         Mental status - alert, oriented to person, place, and time  HEENT: PERRLA; EOMI; Conjunctiva is clear. No nystagmus, visual field normal  Neck - supple, no cervical  Adenopathy, no carotid bruit  Chest - clear to auscultation, no wheezes, rales or rhonchi, symmetric air entry  Heart - normal rate, regular rhythm, normal S1, S2, no murmurs, rubs, clicks or gallops  Abdomen - soft, nontender, nondistended, no masses or organomegaly  Neurological - alert, oriented, normal speech, no focal findings or movement disorder noted, Coordination/Cerebellar: Intact to finger-nose-finger, heel-to-shin exam.     Gait: walks heel and toe without difficulty,      A/P;Headcahe : Likley Migraine/     1. Screening for breast cancer  Mammo Screening w/Tomo Bilateral   2. Need for hepatitis B vaccination  Hepatitis B vaccine adult IM   3. Migraine without aura and without status migrainosus, not intractable  topiramate (TOPAMAX) 25 MG tablet       Info on headaches including Botox, triggers, preventative meds, triptans, nerve blocks etc:     [email or web address]    Info headache diet:     [email or web address]://www.[email or web address]    For  Headache prevention,   Try Magnesium and Vitamin B-2.  Take 400 mg each.  You may start with just 100 mg each per day and  slowly increase.  Too much Magnesium can cause diarrhea.  Remember it is Vitamin B-2 and NOT Vitamin B-12!        Risk & Benefits of the new medication(s) were explained to the patient (and family) who verbalized understanding & agreed to the treatment plan. Patient (family) encouraged to contact me/clinical staff with any questions/concerns

## 2017-04-18 NOTE — Progress Notes (Signed)
Have you seen any specialists/other providers since your last visit with us?    No    Arm preference verified?   Yes    The patient is due for mammogram, colonoscopy, shingles vaccine and [facility]

## 2017-04-18 NOTE — Telephone Encounter (Signed)
Pt will be here at 11. 30 am can you put on her schedule please     Thanks

## 2017-04-25 ENCOUNTER — Encounter (INDEPENDENT_AMBULATORY_CARE_PROVIDER_SITE_OTHER): Payer: Self-pay | Admitting: Student in an Organized Health Care Education/Training Program

## 2017-04-25 ENCOUNTER — Other Ambulatory Visit (INDEPENDENT_AMBULATORY_CARE_PROVIDER_SITE_OTHER): Payer: Self-pay | Admitting: Student in an Organized Health Care Education/Training Program

## 2017-04-25 ENCOUNTER — Telehealth (INDEPENDENT_AMBULATORY_CARE_PROVIDER_SITE_OTHER): Payer: Self-pay | Admitting: Student in an Organized Health Care Education/Training Program

## 2017-04-25 DIAGNOSIS — R519 Headache, unspecified: Secondary | ICD-10-CM

## 2017-04-25 DIAGNOSIS — G43009 Migraine without aura, not intractable, without status migrainosus: Secondary | ICD-10-CM

## 2017-04-25 DIAGNOSIS — Z86018 Personal history of other benign neoplasm: Secondary | ICD-10-CM

## 2017-04-25 DIAGNOSIS — G43709 Chronic migraine without aura, not intractable, without status migrainosus: Secondary | ICD-10-CM

## 2017-04-25 NOTE — Telephone Encounter (Signed)
Ordered       Wendy [name] Khatib Demetress Tift

## 2017-04-25 NOTE — Telephone Encounter (Signed)
Pt informed

## 2017-04-25 NOTE — Telephone Encounter (Signed)
Pt called from [place] and stated she needs a referral for     Neurologist    Dr. Lucia Gaskins    [address]   [place], [place] [address]    Ph:[phone]  Fax: [phone]    Pt stated she needs the referral today since neurologist has an opening only tomorrow or Friday; after this she will need to wait until the end of January.    Pt can best be reached at [phone]

## 2017-04-25 NOTE — Telephone Encounter (Signed)
Please review. Thanks!  

## 2017-05-08 ENCOUNTER — Encounter (INDEPENDENT_AMBULATORY_CARE_PROVIDER_SITE_OTHER): Payer: Self-pay | Admitting: Student in an Organized Health Care Education/Training Program

## 2017-05-23 ENCOUNTER — Encounter (INDEPENDENT_AMBULATORY_CARE_PROVIDER_SITE_OTHER): Payer: Self-pay | Admitting: Student in an Organized Health Care Education/Training Program

## 2017-05-28 ENCOUNTER — Other Ambulatory Visit (INDEPENDENT_AMBULATORY_CARE_PROVIDER_SITE_OTHER): Payer: Self-pay | Admitting: Student in an Organized Health Care Education/Training Program

## 2017-05-28 DIAGNOSIS — Z1239 Encounter for other screening for malignant neoplasm of breast: Secondary | ICD-10-CM

## 2017-06-01 ENCOUNTER — Telehealth (INDEPENDENT_AMBULATORY_CARE_PROVIDER_SITE_OTHER): Payer: Self-pay

## 2017-06-01 ENCOUNTER — Other Ambulatory Visit (INDEPENDENT_AMBULATORY_CARE_PROVIDER_SITE_OTHER): Payer: Self-pay | Admitting: Student in an Organized Health Care Education/Training Program

## 2017-06-01 ENCOUNTER — Ambulatory Visit
Admission: RE | Admit: 2017-06-01 | Discharge: 2017-06-01 | Disposition: A | Discharge status: Home / Self Care | Payer: No Typology Code available for payment source | Source: Ambulatory Visit | Attending: Student in an Organized Health Care Education/Training Program | Admitting: Student in an Organized Health Care Education/Training Program

## 2017-06-01 DIAGNOSIS — Z1231 Encounter for screening mammogram for malignant neoplasm of breast: Secondary | ICD-10-CM | POA: Insufficient documentation

## 2017-06-01 DIAGNOSIS — Z1239 Encounter for other screening for malignant neoplasm of breast: Secondary | ICD-10-CM

## 2017-06-01 NOTE — Telephone Encounter (Signed)
Last blood work was ordered by Dr. Leonard Schwartz. Not sure what blood work he needs! Does he need to see you?

## 2017-06-01 NOTE — Telephone Encounter (Signed)
Pt need blood work ordered for glucose and cholesterol to get checked for Monday while she is still in town.    Please call pt she inform so she knows to come in and get it. [phone]

## 2017-06-01 NOTE — Telephone Encounter (Signed)
Dr. B should put the order in and if she needs to follow up. Pls advise!

## 2017-06-01 NOTE — Telephone Encounter (Signed)
Patient is returning a call regarding who is her pcp.  Patient said she has always been seen by Dr. Salina April with the exception of once.    Patient contact info [phone]    Thank you!

## 2017-06-01 NOTE — Telephone Encounter (Signed)
LVM for her to pick a provider and she might need appt to be seen . Not sure. I notified patient . She only saw Dr. Konrad Dolores once. She has seen Dr. B and she ordered blood work in the past.

## 2017-06-03 NOTE — Telephone Encounter (Signed)
Needs to be seen     Wendy [name]

## 2017-06-04 ENCOUNTER — Encounter (INDEPENDENT_AMBULATORY_CARE_PROVIDER_SITE_OTHER): Payer: Self-pay | Admitting: Student in an Organized Health Care Education/Training Program

## 2017-06-04 ENCOUNTER — Ambulatory Visit (FREE_STANDING_LABORATORY_FACILITY)
Payer: No Typology Code available for payment source | Admitting: Student in an Organized Health Care Education/Training Program

## 2017-06-04 VITALS — BP 117/73 | HR 84 | Temp 98.4°F | Resp 12 | Ht 64.0 in | Wt 179.4 lb

## 2017-06-04 DIAGNOSIS — R202 Paresthesia of skin: Secondary | ICD-10-CM

## 2017-06-04 DIAGNOSIS — R7309 Other abnormal glucose: Secondary | ICD-10-CM

## 2017-06-04 DIAGNOSIS — E782 Mixed hyperlipidemia: Secondary | ICD-10-CM

## 2017-06-04 LAB — CBC AND DIFFERENTIAL
Absolute NRBC: 0 10*3/uL
Basophils Absolute Automated: 0.05 10*3/uL (ref 0.00–0.20)
Basophils Automated: 1.2 %
Eosinophils Absolute Automated: 0.17 10*3/uL (ref 0.00–0.70)
Eosinophils Automated: 4 %
Hematocrit: 39.6 % (ref 37.0–47.0)
Hgb: 12.5 g/dL (ref 12.0–16.0)
Immature Granulocytes Absolute: 0 10*3/uL
Immature Granulocytes: 0 %
Lymphocytes Absolute Automated: 1.96 10*3/uL (ref 0.50–4.40)
Lymphocytes Automated: 46 %
MCH: 28.6 pg (ref 28.0–32.0)
MCHC: 31.6 g/dL — ABNORMAL LOW (ref 32.0–36.0)
MCV: 90.6 fL (ref 80.0–100.0)
MPV: 10.1 fL (ref 9.4–12.3)
Monocytes Absolute Automated: 0.47 10*3/uL (ref 0.00–1.20)
Monocytes: 11 %
Neutrophils Absolute: 1.61 10*3/uL — ABNORMAL LOW (ref 1.80–8.10)
Neutrophils: 37.8 %
Nucleated RBC: 0 /100 WBC (ref 0.0–1.0)
Platelets: 383 10*3/uL (ref 140–400)
RBC: 4.37 10*6/uL (ref 4.20–5.40)
RDW: 14 % (ref 12–15)
WBC: 4.26 10*3/uL (ref 3.50–10.80)

## 2017-06-04 LAB — HEMOGLOBIN A1C
Average Estimated Glucose: 122.6 mg/dL
Hemoglobin A1C: 5.9 % (ref 4.6–5.9)

## 2017-06-04 NOTE — Telephone Encounter (Signed)
This patient needs an appt with Dr. B In order to get referral. Thanks.

## 2017-06-04 NOTE — Telephone Encounter (Signed)
Pt informed she made an appointment today at 2 pm

## 2017-06-04 NOTE — Progress Notes (Signed)
Have you seen any specialists/other providers since your last visit with us?      No      Arm preference verified?     Yes    The patient is due for shingles vaccine and [facility]

## 2017-06-05 LAB — LIPID PANEL
Cholesterol / HDL Ratio: 6.1
Cholesterol: 262 mg/dL — ABNORMAL HIGH (ref 0–199)
HDL: 43 mg/dL (ref 40–9999)
LDL Calculated: 198 mg/dL — ABNORMAL HIGH (ref 0–99)
Triglycerides: 105 mg/dL (ref 34–149)
VLDL Calculated: 21 mg/dL (ref 10–40)

## 2017-06-05 LAB — TSH: TSH: 0.71 u[IU]/mL (ref 0.35–4.94)

## 2017-06-05 LAB — VITAMIN B12: Vitamin B-12: 462 pg/mL (ref 211–911)

## 2017-06-05 LAB — COMPREHENSIVE METABOLIC PANEL
ALT: 18 U/L (ref 0–55)
AST (SGOT): 21 U/L (ref 5–34)
Albumin/Globulin Ratio: 1.3 (ref 0.9–2.2)
Albumin: 4.1 g/dL (ref 3.5–5.0)
Alkaline Phosphatase: 83 U/L (ref 37–106)
BUN: 14 mg/dL (ref 7.0–19.0)
Bilirubin, Total: 0.4 mg/dL (ref 0.2–1.2)
CO2: 25 mEq/L (ref 21–29)
Calcium: 9.6 mg/dL (ref 8.5–10.5)
Chloride: 106 mEq/L (ref 100–111)
Creatinine: 0.7 mg/dL (ref 0.4–1.5)
Globulin: 3.2 g/dL (ref 2.0–3.7)
Glucose: 89 mg/dL (ref 70–100)
Potassium: 3.5 mEq/L (ref 3.5–5.1)
Protein, Total: 7.3 g/dL (ref 6.0–8.3)
Sodium: 140 mEq/L (ref 136–145)

## 2017-06-05 LAB — HEMOLYSIS INDEX: Hemolysis Index: 11 (ref 0–18)

## 2017-06-05 LAB — GFR: EGFR: >60

## 2017-06-08 ENCOUNTER — Other Ambulatory Visit (INDEPENDENT_AMBULATORY_CARE_PROVIDER_SITE_OTHER): Payer: Self-pay | Admitting: Student in an Organized Health Care Education/Training Program

## 2017-06-11 NOTE — Progress Notes (Signed)
CC     Hypertension: occasionally compliant    - The patient feels this issue is  controlled.  - The patient has been monitoring blood pressure.  - The patient has  been adhering to the recommended no-added-salt diet.  - The patient has not been compliant to the recommended exercise program.  - The patient is  tolerating the current medication regimen well.  - The patient denies orthostasis.  - The patient denies chest pain.  - The patient denies dyspnea.  - The patient denies edema.    Hx HLD  Compliant with cholesterol medication  No Myalgias,  Muscle aches , bodyaches  Compliant with low saturated and low carbohydrate Diet  No hx abnormal liver test related to statin therapy       She is prediabetic and would like to check A1C     She has migraine with paresthesia , seen by neurologist and is doing well with elavil 10, diamox       Vitals:    06/04/17 1357   BP: 117/73   Pulse: 84   Resp: 12   Temp: 98.4 F (36.9 C)   SpO2: 98%   Weight: 81.4 kg (179 lb 6.4 oz)   Height: 1.626 m (5\' 4" )       Patient Active Problem List   Diagnosis   . Fuchs' corneal dystrophy   . Status post corneal transplant   . Pseudophakia of left eye   . Nuclear sclerosis of right eye   . Dry eyes, bilateral   . Blepharitis of both eyes   . post DSEK, OS   . Refractive error   . Corneal graft rejection   . ERM OS (epiretinal membrane, left eye)   . PVD (posterior vitreous detachment)   . OC (onychocryptosis)   . Acquired keratoderma   . Sciatica associated with disorder of lumbosacral spine   . Acute pain of left knee       Past Medical History:   Diagnosis Date   . Brain tumor (benign)    . Hypercholesteremia    . Hypertensive disorder        Past Surgical History:   Procedure Laterality Date   . BRAIN SURGERY  2009   . BREAST BIOPSY Right 2008    right breast biopsy- benign   . BREAST BIOPSY Right 1970s    right breast tumor removed 40+ years ago - benign   . BREAST CYST EXCISION  1970s    right breast tumor removed 40+ years ago -  benign   . CESAREAN SECTION     . CORNEAL TRANSPLANT     . EYE SURGERY     . TUBAL LIGATION         Social History     Social History   . Marital status: Divorced     Spouse name: N/A   . Number of children: N/A   . Years of education: N/A     Occupational History   . Not on file.     Social History Main Topics   . Smoking status: Never Smoker   . Smokeless tobacco: Never Used   . Alcohol use No   . Drug use: No   . Sexual activity: Not on file     Other Topics Concern   . Not on file     Social History Narrative   . No narrative on file       Family History   Problem Relation Age  of Onset   . Diabetes Mother    . Hypertension Mother    . Diabetes Father    . Hypertension Father    . Breast cancer Neg Hx        Allergies   Allergen Reactions   . Atorvastatin      Joint pain    . Crestor [Rosuvastatin]      Throat swelling after Lisinopril was added, previous P[CP Stopped Crestor and Pt has been doing fine    . Iodine    . Penicillins    . Tetanus Immune Globulin      Arm swelling    . Zocor [Simvastatin]      No response        Current Outpatient Prescriptions   Medication Sig Dispense Refill   . acetaZOLAMIDE (DIAMOX) 250 MG tablet      . CITRUS BERGAMOT PO Take by mouth.     . co-enzyme Q-10 50 MG capsule Take 50 mg by mouth daily.     . difluprednate (DUREZOL) 0.05 % Emulsion ophthalmic solution Place 1 drop into the left eye daily.      . fluticasone (FLONASE) 50 MCG/ACT nasal spray      . Multiple Vitamin (MULTIVITAMIN) tablet Take 1 tablet by mouth daily.     . nortriptyline (PAMELOR) 10 MG capsule      . lisinopril-hydroCHLOROthiazide (PRINZIDE,ZESTORETIC) 10-12.5 MG per tablet TAKE 1 TABLET BY MOUTH DAILY 90 tablet 3     No current facility-administered medications for this visit.        Constitutional: negative for fatigue, fever, chills   Eyes: + vision problems   Ears, nose, mouth, throat, and face: negative for nose bleeds, nasal congestion   Respiratory: negative for SOB, cough , wheezing    Cardiovascular: negative for CP, dizziness, palpitations, swelling , orthopnea   Gastrointestinal: negative for diarrhea , nausea, vomiting, constipation   Genitourinary:negative for dysuria, frequency and hematuria   Integument/breast: negative for nodule   Hematologic/lymphatic: negative for enlarged LAD,   Musculoskeletal:negative for myalgia, joint pain and swelling   Neurological: + HA, dizziness, tingling ,   Behavioral/Psych: negative for anxiety, depression    Other ROS were reviewed and were neg         Vitals:    06/04/17 1357   BP: 117/73   Pulse: 84   Resp: 12   Temp: 98.4 F (36.9 C)   SpO2: 98%       CONSTITUTIONAL Patient is afebrile, Vital signs reviewed.   HEAD Atraumatic, Normocephallc.   EYES Eyes are normal to inspection, PERRL, No discharge from eyes,   ENT Ears normal to inspection, Nose examination normal, Posterior pharynx normal.   NECK Normal ROM, No jugular venous distention, No meningeal signs.   RESPIRATORY CHEST Chest is nontender, Breath sounds normal.   CARDIOVASCULAR RRR, Heart sounds normal, Normal S1 S2.   ABDOMEN Abdomen is nontender, No pulsatile masses, No other masses,   Bowel sounds normal, No distension, No peritoneal signs.   BACK There is no CVA Tenderness, There is no tenderness to palpation.   UPPER EXTREMITY Inspection normal, No cyanosis.   LOWER EXTREMITY Inspection normal, No cyanosis.   NEURO Cranial nerves intact,   SKIN Skin is warm, Skin is dry, Skin is normal color   LYMPHATIC No adenopathy in neck.   PSYCHIATRIC Oriented X 3, Normal affect. Normal insight.      A/P    1. Paresthesia  Hemoglobin A1C    CBC  and differential    Comprehensive metabolic panel    Lipid panel    TSH    Vitamin B12   2. Mixed hyperlipidemia  Hemoglobin A1C    CBC and differential    Comprehensive metabolic panel    Lipid panel    TSH    Vitamin B12   3. Blood sugar increased  Hemoglobin A1C     Check labs   F/u with neurology   DASH  BP log   Risk & Benefits of the  medication(s)  were explained to the patient (and family) who verbalized understanding & agreed to the treatment plan. Patient (family) encouraged to contact me/clinical staff with any questions/concerns  Check your blood pressure at home or at the drug store on a regular basis.  Goal is under 140/90.  Call or Email on Mychart about the resullts or Schedule appointment if abnormal    normal BP is 120/80 and that high blood pressure is considered when there is persistent elevation of BP > 140/90.  the risks of chronically elevated BP in the long term to include higher risk of CVA and MI.  continue  Medication regularly  salt avoidance, weight loss, regular aerobic exercise, avoidance of heavy use of alcohol, tobacco cessation and medication   At this time I recommended   Increase vegetables, fruits, and fiber in your diet.  Decrease sodium in your diet (less than 2000mg  daily).  Decrease eating out.  Limit caffeine and alcohol intake.    Exercise at least 30 minutes 5 days per week.  Work on decreasing stress in your life in whatever way makes sense for you.      Lifestyle Modifications for high blood pressure     Modification       Recommendation   Approximate SBP** Reduction (Range)++     Weight reduction   Maintain normal body weight (body mass index 18.5-24.9 kg/m2)     5-20 mm Hg/10kg     Adopt DASH eating plan     Consume a diet rich in fruits, vegetables, and low fat dairy products with a reduced content of saturated and total fat     8-14 mm Hg     Dietary sodium reduction     Reduce dietary sodium intake to no more than 100 mmol per day (2.4g sodium or 6g sodium chloride)     2-8 mm Hg     Physical Activity   Engage in regular aerobic physical activity such as brisk walking (at least 30 min per day, most days of the week which may be  broken into shorter time intervals such as 10 minutes each of moderate or vigorous effort)     4-9 mm Hg     Moderation of alcohol consumption   Limit consumption to no more than 2 drinks  (e.g. 24 oz. beer, 10 oz. wine, or 3 oz. 80-proof whiskey) per day in most men, and to no more than 1 drink per day in women and lighter weight persons     2-4 mm Hg   **SBP - systolic blood pressure  ++The effects of implementing these modifications are dose and time dependent, and could be greater for some individuals  +++DASH - Dietary Approaches to Stop Hypertension   Centers for Disease Control and Prevention.  Protocol for Controlling Hypertension in Adults.  [place], [place].  2013.        Patient counseled to watch concentrated sweets and carbohydrates. Patient also counseled to exercise at least  three times a week. Patient counseled about routine foot care  Patient counseled to eat a low fat, low cholesterol diet and to exercise at least three times a week for at least 30 minutes.  Patient counseled to eat a low salt diet(DASH diet)  Followup in three months

## 2017-07-02 ENCOUNTER — Other Ambulatory Visit: Payer: Self-pay

## 2017-07-02 ENCOUNTER — Encounter (HOSPITAL_COMMUNITY): Payer: Self-pay | Admitting: Emergency Medicine

## 2017-07-02 ENCOUNTER — Ambulatory Visit (HOSPITAL_COMMUNITY)
Admission: EM | Admit: 2017-07-02 | Discharge: 2017-07-02 | Disposition: A | Discharge status: Home / Self Care | Payer: Medicare Other | Attending: Family Medicine | Admitting: Family Medicine

## 2017-07-02 DIAGNOSIS — J111 Influenza due to unidentified influenza virus with other respiratory manifestations: Secondary | ICD-10-CM

## 2017-07-02 DIAGNOSIS — R69 Illness, unspecified: Secondary | ICD-10-CM

## 2017-07-02 HISTORY — DX: Pure hypercholesterolemia, unspecified: E78.00

## 2017-07-02 HISTORY — DX: Essential (primary) hypertension: I10

## 2017-07-02 MED ORDER — HYDROCODONE-HOMATROPINE 5-1.5 MG/5ML PO SYRP: 5.0000 mL | ORAL_SOLUTION | Freq: Four times a day (QID) | ORAL | 0 refills | Status: DC | PRN

## 2017-07-02 NOTE — Discharge Instructions (Signed)
Be aware, your cough medication may cause drowsiness. Please do not drive, operate heavy machinery or make important decisions while on this medication, it can cloud your judgement. ° °Follow up with your primary care doctor or here if you are not seeing improvement of your symptoms over the next several days, sooner if you feel you are worsening. ° °Caring for yourself: °Get plenty of rest. °Drink plenty of fluids, enough so that your urine is light yellow or clear like water. If you have kidney, heart, or liver disease and have to limit fluids, talk with your doctor before you increase the amount of fluids you drink. °Take an over-the-counter pain medicine if needed, such as acetaminophen (Tylenol), ibuprofen (Advil, Motrin), or naproxen (Aleve), to relieve fever, headache, and muscle aches. Read and follow all instructions on the label. No one younger than 20 should take aspirin. It has been linked to Reye syndrome, a serious illness. °Before you use over the counter cough and cold medicines, check the label. These medicines may not be safe for children younger than age 6 or for people with certain health problems. °If the skin around your nose and lips becomes sore, put some petroleum jelly on the area. ° °Avoid spreading a flu-like illness: °Wash your hands regularly, and keep your hands away from your face.  °Stay home from school, work, and other public places until you are feeling better and your fever has been gone for at least 24 hours. The fever needs to have gone away on its own without the help of medicine. °

## 2017-07-02 NOTE — ED Provider Notes (Signed)
  [facility]   [id number] 07/02/17 Arrival Time: 0957  ASSESSMENT & PLAN:  1. Influenza-like illness    Meds ordered this encounter  Medications  . HYDROcodone-homatropine (HYCODAN) 5-1.5 MG/5ML syrup    Sig: Take 5 mLs by mouth every 6 (six) hours as needed for cough.    Dispense:  60 mL    Refill:  0   Cough medication sedation precautions. Discussed typical duration of symptoms. OTC symptom care as needed. Ensure adequate fluid intake and rest. May f/u with PCP or here as needed.  Reviewed expectations re: course of current medical issues. Questions answered. Outlined signs and symptoms indicating need for more acute intervention. Patient verbalized understanding. After Visit Summary given.   SUBJECTIVE: History from: patient.  Lysbeth PennerWendy R Cosman is a 62 y.o. female who presents with complaint of nasal congestion, post-nasal drainage, and a persistent dry cough. Onset abrupt, approximately 3-4 days ago. Overall fatigued with body aches. SOB: none. Wheezing: none. Fever: questions subjective. Overall normal PO intake without n/v. Sick contacts: no. OTC treatment: none.   Social History   Tobacco Use  Smoking Status Never Smoker    ROS: As per HPI.   OBJECTIVE:  Vitals:   07/02/17 1029  BP: (!) 141/91  Pulse: 84  Resp: 18  Temp: 97.9 F (36.6 C)  SpO2: 98%     General appearance: alert; appears fatigued HEENT: nasal congestion; clear runny nose; throat irritation secondary to post-nasal drainage Neck: supple without LAD Lungs: unlabored respirations, symmetrical air entry; cough: mild; no respiratory distress Skin: warm and dry Psychological: alert and cooperative; normal mood and affect   Allergies  Allergen Reactions  . Penicillins     hives  . Shellfish Allergy   . Tetanus Toxoids     Past Medical History:  Diagnosis Date  . High cholesterol   . Hypertension     Social History   Socioeconomic History  . Marital status: Married      Spouse name: Not on file  . Number of children: Not on file  . Years of education: Not on file  . Highest education level: Not on file  Social Needs  . Financial resource strain: Not on file  . Food insecurity - worry: Not on file  . Food insecurity - inability: Not on file  . Transportation needs - medical: Not on file  . Transportation needs - non-medical: Not on file  Occupational History  . Not on file  Tobacco Use  . Smoking status: Never Smoker  Substance and Sexual Activity  . Alcohol use: No    Frequency: Never  . Drug use: Not on file  . Sexual activity: Not on file  Other Topics Concern  . Not on file  Social History Narrative  . Not on file           Mardella LaymanHagler, Dehlia Kilner, MD 07/02/17 1054

## 2017-07-02 NOTE — ED Triage Notes (Signed)
Pt c/o fever, cold symptoms, coughing, nasal congestion x3 days.

## 2017-07-03 ENCOUNTER — Encounter (INDEPENDENT_AMBULATORY_CARE_PROVIDER_SITE_OTHER)
Payer: No Typology Code available for payment source | Admitting: Student in an Organized Health Care Education/Training Program

## 2017-07-12 ENCOUNTER — Ambulatory Visit (INDEPENDENT_AMBULATORY_CARE_PROVIDER_SITE_OTHER)
Payer: No Typology Code available for payment source | Admitting: Student in an Organized Health Care Education/Training Program

## 2017-07-16 ENCOUNTER — Other Ambulatory Visit: Payer: Self-pay

## 2017-07-16 ENCOUNTER — Ambulatory Visit (HOSPITAL_COMMUNITY)
Admission: EM | Admit: 2017-07-16 | Discharge: 2017-07-16 | Disposition: A | Discharge status: Home / Self Care | Payer: Medicare Other | Attending: Physician Assistant | Admitting: Physician Assistant

## 2017-07-16 ENCOUNTER — Encounter (HOSPITAL_COMMUNITY): Payer: Self-pay | Admitting: Emergency Medicine

## 2017-07-16 DIAGNOSIS — J40 Bronchitis, not specified as acute or chronic: Secondary | ICD-10-CM

## 2017-07-16 MED ORDER — AZITHROMYCIN 250 MG PO TABS: 250.0000 mg | ORAL_TABLET | Freq: Every day | ORAL | 0 refills | Status: DC

## 2017-07-16 MED ORDER — BENZONATATE 100 MG PO CAPS: 100.0000 mg | ORAL_CAPSULE | Freq: Three times a day (TID) | ORAL | 0 refills | Status: DC

## 2017-07-16 MED ORDER — IPRATROPIUM BROMIDE 0.06 % NA SOLN: 2.0000 | Freq: Four times a day (QID) | NASAL | 0 refills | Status: AC

## 2017-07-16 NOTE — ED Provider Notes (Signed)
[facility]    CSN: [id number] Arrival date & time: 07/16/17  1742     History   Chief Complaint Chief Complaint  Patient presents with  . Cough    HPI Annette Bentley is a 62 y.o. female.   62 year old female comes in for continued URI symptoms after being seen on March 11.  She has continued productive cough, rhinorrhea, nasal congestion.  Chills without known fevers.  She has been taking Hycodan as prescribed without improvement.  Never smoker. States she usually gets this once a year and is treated with azithromycin without problems.      Past Medical History:  Diagnosis Date  . High cholesterol   . Hypertension     There are no active problems to display for this patient.   Past Surgical History:  Procedure Laterality Date  . BRAIN SURGERY    . EYE SURGERY      OB History   None      Home Medications    Prior to Admission medications   Medication Sig Start Date End Date Taking? Authorizing Provider  acetaZOLAMIDE (DIAMOX) 125 MG tablet Take 125 mg by mouth 3 (three) times daily.    [provider]  azithromycin (ZITHROMAX) 250 MG tablet Take 1 tablet (250 mg total) by mouth daily. Take first 2 tablets together, then 1 every day until finished. 07/16/17   Cathie HoopsYu, Symphony Demuro V, PA-C  benzonatate (TESSALON) 100 MG capsule Take 1 capsule (100 mg total) by mouth every 8 (eight) hours. 07/16/17   Cathie HoopsYu, Avila Albritton V, PA-C  Difluprednate (DUREZOL OP) Apply to eye.    [provider]  ezetimibe (ZETIA) 10 MG tablet Take 10 mg by mouth daily.    [provider]  HYDROcodone-homatropine (HYCODAN) 5-1.5 MG/5ML syrup Take 5 mLs by mouth every 6 (six) hours as needed for cough. 07/02/17   Mardella LaymanHagler, Brian, MD  ipratropium (ATROVENT) 0.06 % nasal spray Place 2 sprays into both nostrils 4 (four) times daily. 07/16/17   Cathie HoopsYu, Leylanie Woodmansee V, PA-C  lisinopril-hydrochlorothiazide (PRINZIDE,ZESTORETIC) 10-12.5 MG tablet Take 1 tablet by mouth daily.    [provider]    nortriptyline (PAMELOR) 10 MG capsule Take 10 mg by mouth at bedtime.    [provider]    Family History No family history on file.  Social History Social History   Tobacco Use  . Smoking status: Never Smoker  Substance Use Topics  . Alcohol use: No    Frequency: Never  . Drug use: Not on file     Allergies   Penicillins; Shellfish allergy; and Tetanus toxoids   Review of Systems Review of Systems  Reason unable to perform ROS: See HPI as above.     Physical Exam Triage Vital Signs ED Triage Vitals  Enc Vitals Group     BP 07/16/17 1854 (!) 152/82     Pulse Rate 07/16/17 1854 90     Resp --      Temp 07/16/17 1854 98.9 F (37.2 C)     Temp Source 07/16/17 1854 Oral     SpO2 07/16/17 1854 100 %     Weight --      Height --      Head Circumference --      Peak Flow --      Pain Score 07/16/17 1852 8     Pain Loc --      Pain Edu? --      Excl. in GC? --  No data found.  Updated Vital Signs BP (!) 152/82 (BP Location: Left Arm)   Pulse 90   Temp 98.9 F (37.2 C) (Oral)   SpO2 100%   Physical Exam  Constitutional: She is oriented to person, place, and time. She appears well-developed and well-nourished. No distress.  HENT:  Head: Normocephalic and atraumatic.  Right Ear: Tympanic membrane, external ear and ear canal normal. Tympanic membrane is not erythematous and not bulging.  Left Ear: Tympanic membrane, external ear and ear canal normal. Tympanic membrane is not erythematous and not bulging.  Nose: Nose normal. Right sinus exhibits no maxillary sinus tenderness and no frontal sinus tenderness. Left sinus exhibits no maxillary sinus tenderness and no frontal sinus tenderness.  Mouth/Throat: Uvula is midline, oropharynx is clear and moist and mucous membranes are normal.  Eyes: Pupils are equal, round, and reactive to light. Conjunctivae are normal.  Neck: Normal range of motion. Neck supple.  Cardiovascular: Normal rate, regular rhythm  and normal heart sounds. Exam reveals no gallop and no friction rub.  No murmur heard. Pulmonary/Chest: Effort normal and breath sounds normal. She has no decreased breath sounds. She has no wheezes. She has no rhonchi. She has no rales.  Lymphadenopathy:    She has no cervical adenopathy.  Neurological: She is alert and oriented to person, place, and time.  Skin: Skin is warm and dry.  Psychiatric: She has a normal mood and affect. Her behavior is normal. Judgment normal.     UC Treatments / Results  Labs (all labs ordered are listed, but only abnormal results are displayed) Labs Reviewed - No data to display  EKG None Radiology No results found.  Procedures Procedures (including critical care time)  Medications Ordered in UC Medications - No data to display   Initial Impression / Assessment and Plan / UC Course  I have reviewed the triage vital signs and the nursing notes.  Pertinent labs & imaging results that were available during my care of the patient were reviewed by me and considered in my medical decision making (see chart for details).    Azithromycin for bronchitis. Other symptomatic treatment discussed. Push fluids. Return precautions given.   Final Clinical Impressions(s) / UC Diagnoses   Final diagnoses:  Bronchitis    ED Discharge Orders        Ordered    azithromycin (ZITHROMAX) 250 MG tablet  Daily     07/16/17 1908    ipratropium (ATROVENT) 0.06 % nasal spray  4 times daily     07/16/17 1908    benzonatate (TESSALON) 100 MG capsule  Every 8 hours     07/16/17 1908        Belinda Fisher, PA-C 07/16/17 1914

## 2017-07-16 NOTE — ED Triage Notes (Signed)
States was seen here for cough, c/o productive cough "yellow" mucus and rhinitis. Chest congestion "not better: from last OV

## 2017-07-16 NOTE — Discharge Instructions (Addendum)
Azithromycin for bronchitis. Tessalon for cough. Start flonase, atrovent nasal spray for nasal congestion/drainage. You can use over the counter nasal saline rinse such as neti pot for nasal congestion. Keep hydrated, your urine should be clear to pale yellow in color. Tylenol/motrin for fever and pain. Monitor for any worsening of symptoms, chest pain, shortness of breath, wheezing, swelling of the throat, follow up for reevaluation.  ° °For sore throat try using a honey-based tea. Use 3 teaspoons of honey with juice squeezed from half lemon. Place shaved pieces of ginger into 1/2-1 cup of water and warm over stove top. Then mix the ingredients and repeat every 4 hours as needed. ° °

## 2017-07-17 ENCOUNTER — Ambulatory Visit (INDEPENDENT_AMBULATORY_CARE_PROVIDER_SITE_OTHER)
Payer: No Typology Code available for payment source | Admitting: Student in an Organized Health Care Education/Training Program

## 2017-07-25 ENCOUNTER — Ambulatory Visit (INDEPENDENT_AMBULATORY_CARE_PROVIDER_SITE_OTHER)
Payer: No Typology Code available for payment source | Admitting: Student in an Organized Health Care Education/Training Program

## 2017-07-25 ENCOUNTER — Encounter (INDEPENDENT_AMBULATORY_CARE_PROVIDER_SITE_OTHER): Payer: Self-pay | Admitting: Student in an Organized Health Care Education/Training Program

## 2017-07-25 VITALS — BP 123/77 | HR 80 | Temp 97.4°F | Resp 13 | Ht 63.5 in | Wt 181.4 lb

## 2017-07-25 DIAGNOSIS — R05 Cough: Secondary | ICD-10-CM

## 2017-07-25 DIAGNOSIS — R059 Cough, unspecified: Secondary | ICD-10-CM

## 2017-07-25 DIAGNOSIS — E782 Mixed hyperlipidemia: Secondary | ICD-10-CM

## 2017-07-25 MED ORDER — METHYLPREDNISOLONE 4 MG PO TBPK
4.00 mg | ORAL_TABLET | Freq: Every day | ORAL | 0 refills | Status: DC
Start: 2017-07-25 — End: 2017-08-23

## 2017-07-25 MED ORDER — DOXYCYCLINE HYCLATE 100 MG PO TABS
ORAL_TABLET | ORAL | 0 refills | Status: AC
Start: 2017-07-25 — End: 2017-08-04

## 2017-07-25 NOTE — Patient Instructions (Signed)
Acute Bronchitis  Your healthcare provider has told you that you have acute bronchitis. Bronchitis is infection or inflammation of the bronchial tubes (airways in the lungs). Normally, air moves easily in and out of the airways. Bronchitis narrows the airways, making it harder for air to flow in and out of the lungs. This causes symptoms such as shortness of breath, coughing up yellow or green mucus, and wheezing. Bronchitis can be acute or chronic. Acute means the condition comes on quickly and goes away in a short time, usually within 3 to 10 days. Chronic means a condition lasts a long time and often comes back.    What causes acute bronchitis?  Acute bronchitis almost always starts as a viral respiratory infection, such as a cold or the flu. Certain factors make it more likely for a cold or flu to turn into bronchitis. These include being very young, being elderly, having a heart or lung problem, or having a weak immune system. Cigarette smoking also makes bronchitis more likely.  When bronchitis develops, the airways become swollen. The airways may also become infected with bacteria. This is known as a secondary infection.  Diagnosing acute bronchitis  Your healthcare provider will examine you and ask about your symptoms and health history. You may also have a sputum culture to test the fluid in your lungs. Chest X-rays may be done to look for infection in the lungs.  Treating acute bronchitis  Bronchitis usually clears up as the cold or flu goes away. You can help feel better faster by doing the following:   Take medicine as directed. You may be told to take ibuprofen or other over-the-counter medicines. These help relieve inflammation in your bronchial tubes. Your healthcare provider may prescribe an inhaler to help open up the bronchial tubes. Most of the time,acute bronchitisis caused by a viral infection. Antibiotics are usually not prescribed for viral infections.   Drink plenty of fluids, such as  water, juice, or warm soup. Fluids loosen mucus so that you can cough it up. This helps you breathe more easily. Fluids also prevent dehydration.   Make sure you get plenty of rest.   Do not smoke. Do not allow anyone else to smoke in your home.  Recovery and follow-up  Follow up with your doctor as you are told. You will likely feel better in a week or two. But a dry cough can linger beyond that time. Let your doctor know if you still have symptoms (other than a dry cough) after 2 weeks, or if you're prone to getting bronchial infections. Take steps to protect yourself from future infections. These steps include stopping smoking and avoiding tobacco smoke, washing your hands often, and getting a yearly flu shot.  When to call your healthcare provider  Call the healthcare provider if you have any of the following:   Fever of 100.4F (38.0C) or higher, or as advised   Symptoms that get worse, or new symptoms   Trouble breathing   Symptoms that don't start to improve within a week, or within 3 days of taking antibiotics   Date Last Reviewed: 03/25/2015   2000-2018 [employer] [address], [place] [address]. All rights reserved. This information is not intended as a substitute for professional medical care. Always follow your healthcare professional's instructions.

## 2017-07-25 NOTE — Progress Notes (Signed)
Have you seen any specialists/other providers since your last visit with us?    No    Arm preference verified?   Yes    The patient is due for shingles vaccine and HEP C and [facility]

## 2017-07-25 NOTE — Progress Notes (Signed)
.  Chief Complaint   Patient presents with   . Cough     pt went to UC here for FU x 3 weeks on and off    . Back Pain     upper back pain        Upper Respiratory Infection: Patient complains of symptoms of a URI. Symptoms include congestion, coryza, cough, sore throat and swollen glands. Onset of symptoms was several days ago, gradually worsening since that time. She also c/o cough described as productive, facial pain, low grade fever, nasal congestion, post nasal drip and sinus pressure for the past few days .  She adequate hydration. Evaluation to date: none. Treatment to date: seen at [facility] x2 , treated supportively then with Zpack , she continues to have cough, occasionally productive      Hx HLD  Compliant with cholesterol medication Zetia   No Myalgias,  Muscle aches , bodyaches  Compliant with low saturated and low carbohydrate Diet  poor tol  statin therapy         Vitals:    07/25/17 1110   BP: 123/77   Pulse: 80   Resp: 13   Temp: 97.4 F (36.3 C)   SpO2: 98%   Weight: 82.3 kg (181 lb 6.4 oz)   Height: 1.613 m (5' 3.5")       Patient Active Problem List   Diagnosis   . Fuchs' corneal dystrophy   . Status post corneal transplant   . Pseudophakia of left eye   . Nuclear sclerosis of right eye   . Dry eyes, bilateral   . Blepharitis of both eyes   . post DSEK, OS   . Refractive error   . Corneal graft rejection   . ERM OS (epiretinal membrane, left eye)   . PVD (posterior vitreous detachment)   . OC (onychocryptosis)   . Acquired keratoderma   . Sciatica associated with disorder of lumbosacral spine   . Acute pain of left knee       Past Medical History:   Diagnosis Date   . Brain tumor (benign)    . Hypercholesteremia    . Hypertensive disorder        Past Surgical History:   Procedure Laterality Date   . BRAIN SURGERY  2009   . BREAST BIOPSY Right 2008    right breast biopsy- benign   . BREAST BIOPSY Right 1970s    right breast tumor removed 40+ years ago - benign   . BREAST CYST EXCISION  1970s    right  breast tumor removed 40+ years ago - benign   . CESAREAN SECTION     . CORNEAL TRANSPLANT     . EYE SURGERY     . TUBAL LIGATION         Social History     Social History   . Marital status: Divorced     Spouse name: N/A   . Number of children: N/A   . Years of education: N/A     Occupational History   . Not on file.     Social History Main Topics   . Smoking status: Never Smoker   . Smokeless tobacco: Never Used   . Alcohol use No   . Drug use: No   . Sexual activity: Not on file     Other Topics Concern   . Not on file     Social History Narrative   . No narrative on file  Family History   Problem Relation Age of Onset   . Diabetes Mother    . Hypertension Mother    . Diabetes Father    . Hypertension Father    . Breast cancer Neg Hx        Allergies   Allergen Reactions   . Atorvastatin      Joint pain    . Crestor [Rosuvastatin]      Throat swelling after Lisinopril was added, previous P[CP Stopped Crestor and Pt has been doing fine    . Iodine    . Penicillins    . Tetanus Immune Globulin      Arm swelling    . Zocor [Simvastatin]      No response        Current Outpatient Prescriptions   Medication Sig Dispense Refill   . acetaZOLAMIDE (DIAMOX) 250 MG tablet      . co-enzyme Q-10 50 MG capsule Take 50 mg by mouth daily.     . difluprednate (DUREZOL) 0.05 % Emulsion ophthalmic solution Place 1 drop into the left eye daily.      Marland Kitchen ipratropium (ATROVENT) 0.06 % nasal spray 2 sprays by Nasal route 4 (four) times daily     . lisinopril-hydroCHLOROthiazide (PRINZIDE,ZESTORETIC) 10-12.5 MG per tablet TAKE 1 TABLET BY MOUTH DAILY 90 tablet 3   . Multiple Vitamin (MULTIVITAMIN) tablet Take 1 tablet by mouth daily.     . nortriptyline (PAMELOR) 10 MG capsule      . fluticasone (FLONASE) 50 MCG/ACT nasal spray        No current facility-administered medications for this visit.      congestion, coryza,  nasal congestion and ear fullness, itchy palates , watery eyes   Eyes:no vision changes  Cardiovascular: negative  for CP, dizziness, palpitations, swelling , orthopnea   Gastrointestinal: negative for diarrhea , nausea, vomiting, constipation   Genitourinary:negative for dysuria, frequency and hematuria   Integument/breast: negative for nodule   Hematologic/lymphatic: negative for enlarged LAD,   Musculoskeletal:negative for myalgia, joint pain and swelling   Neurological: negative for HA, dizziness, tingling ,   Behavioral/Psych: negative for anxiety, depression   Endocrine: negative for weight gain, weight loss, striae, skin pigmentation  All other system were reviewed and were neg        CPE  Vitals:    07/25/17 1110   BP: 123/77   Pulse: 80   Resp: 13   Temp: 97.4 F (36.3 C)   SpO2: 98%         Patient appears in NAD, A+O X 3. Temp as noted above.   HEENT : EOMI , + erythema over oropharynx , +PND   Nasal lining is  red and congested,   +air fluid level over both  TMs  . Sinus are tender to palpation, R>L, Anterior cervical nodes are present.   chest  Has scattered rhonchi b/l, minimal wheezing   heart : RRR, nl S1, S2.    abd : S/NT/ND, No rashes. No hepatosplenomegaly.          A/P    1. Cough  XR Chest 2 Views    doxycycline (VIBRA-TABS) 100 MG tablet    methylPREDNISolone (MEDROL DOSPACK) 4 MG tablet   2. Mixed hyperlipidemia  Lipid panel    Hepatic function panel (LFT)             Saline nasal irrigation encouraged, Use of a humidifier has been advised  Gargle with warm saline three to four  times a day  Advised to drink plenty of fluids, take any over the counter medications as advised.  See instructions outlined in AVS below and discussed with patient and/or companion(s) present on today's visit.  Any prescriptions (s) as appropriate have been sent electronically to provided pharmacy or hard copy provided today.  Patient advised to review the relevant therapeutic related side effects and adverse reactions with pharmacy prior to starting medication (s).  Advised to return to clinic and/or call if adverse reaction or  side effects to any treatment(s) prescribed.        Jacqualyn Posey Kalyan Barabas MD

## 2017-07-26 ENCOUNTER — Other Ambulatory Visit (FREE_STANDING_LABORATORY_FACILITY): Payer: No Typology Code available for payment source

## 2017-07-26 DIAGNOSIS — E782 Mixed hyperlipidemia: Secondary | ICD-10-CM

## 2017-07-26 LAB — HEPATIC FUNCTION PANEL
ALT: 15 U/L (ref 0–55)
AST (SGOT): 17 U/L (ref 5–34)
Albumin/Globulin Ratio: 1.1 (ref 0.9–2.2)
Albumin: 3.7 g/dL (ref 3.5–5.0)
Alkaline Phosphatase: 94 U/L (ref 37–106)
Bilirubin Direct: 0.1 mg/dL (ref 0.0–0.5)
Bilirubin Indirect: 0.1 mg/dL — ABNORMAL LOW (ref 0.2–1.0)
Bilirubin, Total: 0.2 mg/dL (ref 0.2–1.2)
Globulin: 3.4 g/dL (ref 2.0–3.7)
Protein, Total: 7.1 g/dL (ref 6.0–8.3)

## 2017-07-26 LAB — LIPID PANEL
Cholesterol / HDL Ratio: 5.9
Cholesterol: 226 mg/dL — ABNORMAL HIGH (ref 0–199)
HDL: 38 mg/dL — ABNORMAL LOW (ref 40–9999)
LDL Calculated: 159 mg/dL — ABNORMAL HIGH (ref 0–99)
Triglycerides: 143 mg/dL (ref 34–149)
VLDL Calculated: 29 mg/dL (ref 10–40)

## 2017-07-26 LAB — HEMOLYSIS INDEX: Hemolysis Index: 6 (ref 0–18)

## 2017-08-23 ENCOUNTER — Ambulatory Visit
Admission: RE | Admit: 2017-08-23 | Discharge: 2017-08-23 | Disposition: A | Discharge status: Home / Self Care | Payer: No Typology Code available for payment source | Source: Ambulatory Visit | Attending: Student in an Organized Health Care Education/Training Program | Admitting: Student in an Organized Health Care Education/Training Program

## 2017-08-23 ENCOUNTER — Ambulatory Visit (INDEPENDENT_AMBULATORY_CARE_PROVIDER_SITE_OTHER)
Payer: No Typology Code available for payment source | Admitting: Student in an Organized Health Care Education/Training Program

## 2017-08-23 ENCOUNTER — Encounter (INDEPENDENT_AMBULATORY_CARE_PROVIDER_SITE_OTHER): Payer: Self-pay | Admitting: Student in an Organized Health Care Education/Training Program

## 2017-08-23 VITALS — BP 120/78 | HR 74 | Temp 97.9°F | Resp 13 | Ht 63.0 in | Wt 179.6 lb

## 2017-08-23 DIAGNOSIS — G8929 Other chronic pain: Secondary | ICD-10-CM | POA: Insufficient documentation

## 2017-08-23 DIAGNOSIS — M7989 Other specified soft tissue disorders: Secondary | ICD-10-CM

## 2017-08-23 DIAGNOSIS — M170 Bilateral primary osteoarthritis of knee: Secondary | ICD-10-CM | POA: Insufficient documentation

## 2017-08-23 DIAGNOSIS — M25561 Pain in right knee: Secondary | ICD-10-CM | POA: Insufficient documentation

## 2017-08-23 DIAGNOSIS — N761 Subacute and chronic vaginitis: Secondary | ICD-10-CM

## 2017-08-23 DIAGNOSIS — N898 Other specified noninflammatory disorders of vagina: Secondary | ICD-10-CM

## 2017-08-23 DIAGNOSIS — M79604 Pain in right leg: Secondary | ICD-10-CM | POA: Insufficient documentation

## 2017-08-23 DIAGNOSIS — M25562 Pain in left knee: Secondary | ICD-10-CM | POA: Insufficient documentation

## 2017-08-23 DIAGNOSIS — R7989 Other specified abnormal findings of blood chemistry: Secondary | ICD-10-CM

## 2017-08-23 DIAGNOSIS — L2084 Intrinsic (allergic) eczema: Secondary | ICD-10-CM

## 2017-08-23 LAB — POCT URINALYSIS DIPSTIX (10)(MULTI-TEST)
Bilirubin, UA POCT: NEGATIVE
Glucose, UA POCT: NEGATIVE mg/dL
Ketones, UA POCT: NEGATIVE mg/dL
Nitrite, UA POCT: NEGATIVE
POCT Spec Gravity, UA: 1.015 (ref 1.001–1.035)
POCT pH, UA: 6 (ref 5–8)
Protein, UA POCT: NEGATIVE mg/dL
Urobilinogen, UA: 0.2 mg/dL

## 2017-08-23 MED ORDER — FLUCONAZOLE 150 MG PO TABS
ORAL_TABLET | ORAL | 0 refills | Status: AC
Start: 2017-08-23 — End: 2017-08-30

## 2017-08-23 MED ORDER — MELOXICAM 15 MG PO TABS
15.00 mg | ORAL_TABLET | Freq: Every day | ORAL | 0 refills | Status: AC
Start: 2017-08-23 — End: 2017-09-22

## 2017-08-23 MED ORDER — FLUCONAZOLE 150 MG PO TABS
150.00 mg | ORAL_TABLET | Freq: Once | ORAL | 0 refills | Status: DC
Start: 2017-08-23 — End: 2017-08-23

## 2017-08-23 MED ORDER — TRIAMCINOLONE ACETONIDE 0.025 % EX OINT
TOPICAL_OINTMENT | Freq: Two times a day (BID) | CUTANEOUS | 1 refills | Status: DC
Start: 2017-08-23 — End: 2023-04-07

## 2017-08-23 MED ORDER — VITAMIN D (ERGOCALCIFEROL) 1.25 MG (50000 UT) PO CAPS
50000.00 [IU] | ORAL_CAPSULE | ORAL | 3 refills | Status: DC
Start: 2017-08-23 — End: 2018-04-05

## 2017-08-23 NOTE — Progress Notes (Signed)
Chief Complaint   Patient presents with   . Vaginal Itching   . Knee Pain     R knee x 1 week no injuries        She reports to vaginal discharge for  which is mostly clear and odourless associated with burning sensation on her vulva, some itching, no Hx STDs in the past , she reports to unprotecetd sex or change in the partner No dysuria, hematuria or urinary disturbance.  Tried monistat w/o help     Reports worsening B/l knee pain for a few years but increased recently , L>R,   Localizes pain to the anterior knee and increased with stairs.  She reports swelling and stiffness post activities.  She denies mechanical symptoms such as catching or locking.  Aidyn denies a sense of instability.  She has h/o knee OA and was seen by sport medicine at [facility] ,  She has attempted modifcation of her activities with little improvement in her symptoms., +R calf swelling   +recent flight x1 hour from [place]    Vit D makes it feel better     She also  c/o eczematous dermatitis over back       Past Medical History:   Diagnosis Date   . Brain tumor (benign)    . Hypercholesteremia    . Hypertensive disorder       Past Surgical History:   Procedure Laterality Date   . BRAIN SURGERY  2009   . BREAST BIOPSY Right 2008    right breast biopsy- benign   . BREAST BIOPSY Right 1970s    right breast tumor removed 40+ years ago - benign   . BREAST CYST EXCISION  1970s    right breast tumor removed 40+ years ago - benign   . CESAREAN SECTION     . CORNEAL TRANSPLANT     . EYE SURGERY     . TUBAL LIGATION          (Not in a hospital admission)  Current/Home Medications    ACETAZOLAMIDE (DIAMOX) 250 MG TABLET        CO-ENZYME Q-10 50 MG CAPSULE    Take 50 mg by mouth daily.    DIFLUPREDNATE (DUREZOL) 0.05 % EMULSION OPHTHALMIC SOLUTION    Place 1 drop into the left eye daily.     FLUTICASONE (FLONASE) 50 MCG/ACT NASAL SPRAY        IPRATROPIUM (ATROVENT) 0.06 % NASAL SPRAY    2 sprays by Nasal route 4 (four) times daily     LISINOPRIL-HYDROCHLOROTHIAZIDE (PRINZIDE,ZESTORETIC) 10-12.5 MG PER TABLET    TAKE 1 TABLET BY MOUTH DAILY    MULTIPLE VITAMIN (MULTIVITAMIN) TABLET    Take 1 tablet by mouth daily.    NORTRIPTYLINE (PAMELOR) 10 MG CAPSULE         Allergies   Allergen Reactions   . Atorvastatin      Joint pain    . Crestor [Rosuvastatin]      Throat swelling after Lisinopril was added, previous P[CP Stopped Crestor and Pt has been doing fine    . Iodine    . Penicillins    . Tetanus Immune Globulin      Arm swelling    . Zocor [Simvastatin]      No response       Social History   Substance Use Topics   . Smoking status: Never Smoker   . Smokeless tobacco: Never Used   . Alcohol use No  Family History   Problem Relation Age of Onset   . Diabetes Mother    . Hypertension Mother    . Diabetes Father    . Hypertension Father    . Breast cancer Neg Hx           \Constitutional: negative for fatigue, fever, chills   Eyes: negative for vision problems   Ears, nose, mouth, throat, and face: negative for nose bleeds, nasal congestion   Respiratory: negative for SOB, cough , wheezing   Cardiovascular: negative for CP, dizziness, palpitations, swelling , orthopnea   Gastrointestinal: negative for diarrhea , nausea, vomiting, constipation   Genitourinary:negative for dysuria, frequency and hematuria, +vaginal d/c    Integument/breast: negative for nodule   Hematologic/lymphatic: negative for enlarged LAD,   Musculoskeletal: +B/l knee pain   Neurological: negative for HA, dizziness, tingling ,   Behavioral/Psych: negative for anxiety, depression   Endocrine: negative for weight gain, weight loss, striae, skin pigmentation   +rash   Other ROS were reviewed and were neg           Vitals:    08/23/17 0922   BP: 120/78   Pulse: 74   Resp: 13   Temp: 97.9 F (36.6 C)   SpO2: 98%     Physical Examination:    Constitutional: He appears well-developed and well-nourished.   HENT:   Head: Normocephalic.   Right Ear: Tympanic membrane, external ear  and ear canal normal.   Left Ear: Tympanic membrane, external ear and ear canal normal.   Nose: Nose normal.   Mouth/Throat: Oropharynx is clear and moist.   Eyes: Conjunctivae normal and EOM are normal. Pupils are equal, round, and reactive to light.   Neck: Neck supple.   Cardiovascular: Normal rate, regular rhythm and normal heart sounds.  Exam reveals no gallop and no friction rub.    No murmur heard.  Pulmonary/Chest: Effort normal and breath sounds normal. He has no wheezes. He has no rales.   abd : S/NT/ND   Knee : +TTP over medical compartment , full ROM , + swelling over R calf   Pelvic exam: normal external genitalia, vulva, vagina, cervix, uterus and adnexa.    Skin + eczematous papular rash over low back   Results     ** No results found for the last 96 hours. **                Assessment/Plan:    1. Vaginal itching  POCT UA Dipstix (10)(Multi-Test)   2. Chronic pain of both knees  XR Knee Right 4+ Views    XR Knee Left 4+ Views    meloxicam (MOBIC) 15 MG tablet    Ambulatory referral to Physical Therapy   3. Calf swelling     4. Chronic vaginitis  SureSwab(TM) Plus    fluconazole (DIFLUCAN) 150 MG tablet    DISCONTINUED: fluconazole (DIFLUCAN) 150 MG tablet   5. Intrinsic eczema  triamcinolone (KENALOG) 0.025 % ointment   6. Pain of right lower extremity  US Venous Low Extrem Duplx Dopp Uni Right   7. Low vitamin D level  vitamin D, ergocalciferol, (DRISDOL) 50000 UNIT Cap       1. Vaginal itching    - POCT UA Dipstix (10)(Multi-Test)  U/A "clean         Risk & Benefits of the new medication(s) were explained to the patient who verbalized understanding & agreed to the treatment plan    Murdock Jellison Khatib Chaundra Abreu

## 2017-08-23 NOTE — Progress Notes (Signed)
Have you seen any specialists/other providers since your last visit with us?    No    Arm preference verified?   Yes    The patient is due for shingles vaccine and [facility] and HEP C

## 2017-08-25 ENCOUNTER — Emergency Department: Payer: No Typology Code available for payment source

## 2017-08-25 ENCOUNTER — Emergency Department
Admission: EM | Admit: 2017-08-25 | Discharge: 2017-08-25 | Disposition: A | Discharge status: Home / Self Care | Payer: No Typology Code available for payment source | Attending: Emergency Medicine | Admitting: Emergency Medicine

## 2017-08-25 DIAGNOSIS — I10 Essential (primary) hypertension: Secondary | ICD-10-CM | POA: Insufficient documentation

## 2017-08-25 DIAGNOSIS — M7989 Other specified soft tissue disorders: Secondary | ICD-10-CM | POA: Insufficient documentation

## 2017-08-25 DIAGNOSIS — Z79899 Other long term (current) drug therapy: Secondary | ICD-10-CM | POA: Insufficient documentation

## 2017-08-25 DIAGNOSIS — M1711 Unilateral primary osteoarthritis, right knee: Secondary | ICD-10-CM

## 2017-08-25 DIAGNOSIS — E7800 Pure hypercholesterolemia, unspecified: Secondary | ICD-10-CM | POA: Insufficient documentation

## 2017-08-25 LAB — SURESWAB(TM) PLUS
Atopobium vaginae: NOT DETECTED
Candida Glabrata,DNA: NOT DETECTED
Candida Parapsilosis,DNA: NOT DETECTED
Candida Tropicalis,DNA: NOT DETECTED
Candida albicans DNA: DETECTED — AB
Chlamydia trachomatis RNA, TMA: NOT DETECTED
Gardnerella vaginalis: >8
Lactobacillus species: NOT DETECTED
Megasphaera species: NOT DETECTED
N. gonorrhoeae RNA, TMA: NOT DETECTED
Trichomonas Vaginalis RNA, QL TMA: NOT DETECTED

## 2017-08-25 NOTE — ED Triage Notes (Signed)
62 yo female presents to ED w/ c/o RLE toward distal ankle swelling.  Pt was seen thur at pcp to rule out DVT and assess knee pain. X-ray of bilateral knees and US performed. All negative for DVT. 9/10 pain w/ ambulation in RLE. No know injury. Denies N/T and any chest pain. +2 pedal pulses.

## 2017-08-25 NOTE — Discharge Instructions (Signed)
DVT LE Ruled Out    You were seen for your leg symptoms.    This can include redness, swelling and/or pain. In these cases, the doctor is often worried that you had a blood clot in your leg. This is also called a deep venous thrombosis (DVT).    The diagnosis is often from an ultrasound of the leg. This is also called a duplex Doppler ultrasound. This is a non-invasive (on the outside of your body) test. It can detect a blood clot in the veins. An ultrasound specialist does the test. A radiologist or vascular (blood vessel) specialist looks at it. The test uses sound waves to look at the veins. It can help decide if they have blood flowing in them or if they are clotted. The test is very good at finding blood clots in the upper leg (thigh). It can also find clots in the lower leg (calf). However, the test is not as good in this part of the leg. The smaller the clot, the harder it can be to find. In these cases, the ultrasound can be re-done in a few days. The doctor will decide if this is needed.    The ultrasound of your leg was normal. It did not show any blood clots.    The exact cause of your problem is still not known. We don't know the exact cause of your symptoms. However, your doctor thinks it s safe for you to go home. You may need to see your doctor or referral doctor for more tests.    Medical problems can be very complex and some conditions can be very difficult to figure out, even with advanced medical testing. It is important to understand that even after an Emergency Department (or Urgent Care) visit or an observation stay, you may still have a serious medical problem.    YOU SHOULD SEEK MEDICAL ATTENTION IMMEDIATELY, EITHER HERE OR AT THE NEAREST EMERGENCY DEPARTMENT, IF ANY OF THE FOLLOWING OCCURS:   Your leg swelling gets worse.   Your legs get red and you have pain / fever (temperature higher than 100.4F / 38C).   You have any chest pain or tightness or pressure over your heart.   You  are short of breath, especially if there is pain in your chest when you breathe in.   Your leg feels numb (no feeling) or cool.   Fainting.   Your symptoms or problems get worse.              Osteoarthritis     You were diagnosed with osteoarthritis.    Osteoarthritis (OA) is a type of problem with the joints where they start to suffer from wear and tear, as you get older. It is also called "degenerative arthritis" or "degenerative joint disease."    Normally, there is a tough, rubbery layer of cartilage on the ends of each of the bones that makes up a joint. The cartilage is there to protect the bone and to provide a cushioned, slippery surface. This way, the bones can move smoothly when the joint is bending. When the cartilage gets worn, the bone ends are not as well protected. As the exposed bones rub against each other when the joint bends, it causes irritation, pain and swelling. Any joint can be affected. However, OA is most common in the spine, hands, hips, knees, and feet.     OA is more common as a person gets older. Aging itself does not cause OA. However, changes   in the body that happen when one gets older make the chance of getting OA more likely.     Other things that can speed up the development of OA are:     Repetitive activity that involves constantly stressing the joints.   Being overweight. This puts more wear and tear on the joints.   Family history of OA. OA tends to run in families.   Fractures that extend into the joint and cause injury to the cartilage surfaces.     Symptoms of OA can include:   Joint pain. This is usually made worse with heavy activity and repetitive bending.   Stiffness in the joint when bending and moving the joint. This is usually worse in the morning. It gets better with activity as the joint "warms up."   Swelling. Joint swelling can happen when fluid seeps into the joint from chronic irritation.   Crackling and popping noises when the joint is  moved.   Lumps of enlarged bone that can develop on the fingers.    There is no cure for OA. The treatment of OA is designed to control pain and slow down the wear and tear process. Some things that are used to treat OA include:     Pain medications: Acetaminophen (Tylenol) and medications called NSAIDS like ibuprofen (Advil/Motrin) are often used first for pain control.     Steroid injections: This can cause temporary relief of pain. However, repeated injections into the joints can lead to more joint problems.     Life style changes: This includes weight loss and getting involved in exercise programs to help strengthen muscles around painful joints without making the wear and tear worse.     Physical therapy (PT): This may help to build strength and decrease joint stiffness.     In more severe cases, joint replacement surgery may be an option if symptoms can't be controlled using conventional measures.    There does NOT seem to be any evidence that "alternative treatments" like vitamins (A, E, and C), glucosamine, ginger, chondroitin sulfate and others have any helpful effects. ALWAYS talk to your doctor if you are going to try any alternative treatments.     Take all prescribed medications as directed. Unless you are told something different by your doctors, you can take over-the-counter pain medicines. These medicines can include ibuprofen (Advil/Motrin) or acetaminophen (Tylenol). Follow the directions on the package.    Get involved in any physical therapy recommended by your doctor.    YOU SHOULD SEEK MEDICAL ATTENTION IMMEDIATELY, EITHER HERE OR AT THE NEAREST EMERGENCY DEPARTMENT, IF ANY OF THE FOLLOWING OCCUR:   You have any of the above symptoms and are unable to see or contact your doctor.   You get a fever (temperature higher than 100.81F or 38C) that is associated with pain or redness over your affected joint.   You have severe swelling in your painful joint.    If you can't  follow up with your doctor, or if at any time you feel you need to be rechecked or seen again, come back here or go to the nearest emergency department.    Dear Wendy [name],    You were seen today by Sherri Sear, PA-C. Thank you for choosing the [facility] for your healthcare needs.  We hope your visit today was EXCELLENT.    Continue the meloxicam.   Use the Ace wrap as needed for compression around the knee, as this may  also help with her swelling. Elevate the leg as much as possible.   Follow-up with orthopedics this week.   Return immediately to the emergency room if you have any worsening symptoms! Otherwise, see general doctor in the next 2 days, or return to the emergency department in 2 days if you have any trouble getting an appointment.    Please take any medications prescribed as directed.     If you have any questions or concerns, I am available at [phone]. Please do not hesitate to contact me if I can be of assistance.     Below is some information and resources that our patients often find helpful.    Sincerely,    Sherri Sear, Physician Assistant  [facility]    ________________________________________________________________  Thank you for choosing [facility] for your emergency care needs.  We strive to provide EXCELLENT care to you and your family.      If you do not continue to improve or your condition worsens, please contact your doctor or return immediately to the Emergency Department.    DOCTOR REFERRALS  Call [phone] (available 24 hours a day, 7 days a week) if you need any further referrals and we can help you find a primary care doctor or specialist.  Also, available online at:  [email or web address]    YOUR CONTACT INFORMATION  Before leaving please check with registration to make sure we have an up-to-date contact number.  You can call registration at [phone] to update  your information.  For questions about your hospital bill, please call [phone].  For questions about your Emergency Dept Physician bill please call [phone].      FREE HEALTH SERVICES  If you need help with health or social services, please call 2-1-1 for a free referral to resources in your area.  2-1-1 is a free service connecting people with information on health insurance, free clinics, pregnancy, mental health, dental care, food assistance, housing, and substance abuse counseling.  Also, available online at:  http://www.[facility].org    MEDICAL RECORDS AND TESTS  Certain laboratory test results do not come back the same day, for example urine cultures.  We will contact you if other important findings are noted. Radiology films are often reviewed again to ensure accuracy.  If there is any discrepancy, we will notify you.    Please call [phone] to pick up a complimentary CD of any radiology studies performed.  If you or your doctor would like to request a copy of your medical records, please call [phone].      ORTHOPEDIC INJURY   Please know that significant injuries can exist even when an initial x-ray is read as normal or negative.  This can occur because some fractures (broken bones) are not initially visible on x-rays.  For this reason, close outpatient follow-up with your primary care doctor or bone specialist (orthopedist) is required.    MEDICATIONS AND FOLLOWUP  Please be aware that some prescription medications can cause drowsiness.  Use caution when driving or operating machinery.    The examination and treatment you have received in our Emergency Department is provided on an emergency basis, and is not intended to be a substitute for your primary care physician.  It is important that your doctor checks you again and that you report any new or remaining problems at that time.      24 HOUR PHARMACIES  [facility] - [address]  [address], [place], [place] [address] (1.4 miles, 7  minutes)  [facility] - [address], [place], [place] [address] (6.5 miles, 13 minutes)  Handout with directions available on request.

## 2017-08-25 NOTE — ED Provider Notes (Signed)
Physician/Midlevel provider first contact with patient: 08/25/17 0754         History     Chief Complaint   Patient presents with   . Leg Swelling     62 yo female with HTN, HLD, benign brain tumor p/w R leg swelling x 1 week. The swelling is gradually worsening. Pt notes R calf pain and knee pain. It has become severe. Pt has increased activity, walking up and down stairs, as well as frequent driving and flying between [place] and [place] over the past few weeks. She denies any kneeling or bending on the knee. She was started on Meloxicam yesterday, which she states has not significantly helped. Her PCP ordered an Xray and RLE [place], which only revealed arthritis in the R knee but no DVT or other acute finding. She denies any CP, SOB, palpitations or other symptoms. Pt has not scheduled an appointment with orthopedics at this time.       The history is provided by the patient. No language interpreter was used.            Past Medical History:   Diagnosis Date   . Brain tumor (benign)    . Hypercholesteremia    . Hypertensive disorder        Past Surgical History:   Procedure Laterality Date   . BRAIN SURGERY  2009   . BREAST BIOPSY Right 2008    right breast biopsy- benign   . BREAST BIOPSY Right 1970s    right breast tumor removed 40+ years ago - benign   . BREAST CYST EXCISION  1970s    right breast tumor removed 40+ years ago - benign   . CESAREAN SECTION     . CORNEAL TRANSPLANT     . EYE SURGERY     . TUBAL LIGATION         Family History   Problem Relation Age of Onset   . Diabetes Mother    . Hypertension Mother    . Diabetes Father    . Hypertension Father    . Breast cancer Neg Hx        Social  Social History   Substance Use Topics   . Smoking status: Never Smoker   . Smokeless tobacco: Never Used   . Alcohol use No       .     Allergies   Allergen Reactions   . Atorvastatin      Joint pain    . Crestor [Rosuvastatin]      Throat swelling after Lisinopril was added, previous P[CP Stopped Crestor and Pt has been doing  fine    . Iodine    . Penicillins    . Tetanus Immune Globulin      Arm swelling    . Zocor [Simvastatin]      No response        Home Medications     Med List Status:  In Progress Set By: Rochele Pages, RN at 08/25/2017  7:55 AM                acetaZOLAMIDE (DIAMOX) 250 MG tablet          co-enzyme Q-10 50 MG capsule     Take 50 mg by mouth daily.     difluprednate (DUREZOL) 0.05 % Emulsion ophthalmic solution     Place 1 drop into the left eye daily.      fluticasone (FLONASE) 50 MCG/ACT nasal spray  ipratropium (ATROVENT) 0.06 % nasal spray     2 sprays by Nasal route 4 (four) times daily     lisinopril-hydroCHLOROthiazide (PRINZIDE,ZESTORETIC) 10-12.5 MG per tablet     TAKE 1 TABLET BY MOUTH DAILY     meloxicam (MOBIC) 15 MG tablet     Take 1 tablet (15 mg total) by mouth daily     Multiple Vitamin (MULTIVITAMIN) tablet     Take 1 tablet by mouth daily.     nortriptyline (PAMELOR) 10 MG capsule          triamcinolone (KENALOG) 0.025 % ointment     Apply topically 2 (two) times daily     vitamin D, ergocalciferol, (DRISDOL) 50000 UNIT Cap     Take 1 capsule (50,000 Units total) by mouth once a week           Flagged for Removal             fluconazole (DIFLUCAN) 150 MG tablet     1 tab po q 3days x 3 doses           Review of Systems   Constitutional: Negative for chills and fever.   Respiratory: Negative for shortness of breath.    Cardiovascular: Positive for leg swelling. Negative for chest pain and palpitations.   Gastrointestinal: Negative for nausea and vomiting.   Musculoskeletal: Positive for arthralgias, gait problem and joint swelling. Negative for neck pain and neck stiffness.   Skin: Negative for color change and wound.   Neurological: Negative for syncope and numbness.   All other systems reviewed and are negative.      Physical Exam         Physical Exam   Constitutional: She is oriented to person, place, and time. She appears well-developed and well-nourished. No distress.   HENT:    Head: Normocephalic and atraumatic.   Eyes: Pupils are equal, round, and reactive to light. Conjunctivae are normal. No scleral icterus.   Neck: Normal range of motion.   Cardiovascular: Normal rate, regular rhythm and normal heart sounds.    Pulmonary/Chest: Effort normal and breath sounds normal. No respiratory distress.   Abdominal: Soft. Bowel sounds are normal. She exhibits no distension. There is no tenderness.   Musculoskeletal:        Right knee: She exhibits decreased range of motion and swelling. She exhibits no deformity, no laceration and no erythema. Tenderness found. Medial joint line tenderness noted.        Left knee: Normal.        Right lower leg: She exhibits tenderness and swelling.        Right foot: There is swelling. There is no tenderness and no bony tenderness.   RLE with 1+ swelling from the knee down into the foot. Nonpitting. Non-tender. No redness or warmth.   Diffuse tenderness over the anterior knee.   No popliteal fossa tenderness.  Tenderness throughout the R calf.    Neurological: She is alert and oriented to person, place, and time.   Skin: Skin is warm and dry. Capillary refill takes less than 2 seconds.   Psychiatric: She has a normal mood and affect.   Nursing note and vitals reviewed.        MDM and ED Course     ED Medication Orders     None             MDM  Number of Diagnoses or Management Options  Arthritis of right knee:  Right leg swelling:   Diagnosis management comments: Abnormal results/incidental findings discussed with pt and/or family: yes    MDM and Clinical Notes  Working Differential (not completely inclusive): muscular injury, DVT, arthritis, cellulitis, gout, dependent edema, etc  Nursing records reviewed and agree: Yes    Clinical Notes & Decision Making:   62 yo female with RLE swelling, which has worsened in the last 48 hours. While pt had neg xray and [place] this week, will repeat US, as symptoms have progressed.     [place] neg for DVT.   Suspect swelling due to  MSK injury to knee, as pt has increased activities (moving son) and recent flights. Advised elevation, continuation of meloxicam and f/u with ortho. Pt/pt's family understands and agrees with the plan of care. Given return precautions.     Return Precautions    The patient is aware that this evaluation is only a screening for emergent conditions related to his or her symptoms and presentation.   I discussed the need for prompt follow-up.  Patient demonstrates verbal understanding.  Patient advised to return to the ED for any worsening symptoms, uncontrolled pain if applicable, worsening fevers, or any changes in their condition prompting concern and need for repeat evaluationand/or additional management.        Re-Eval: Re-eval at     Rendering Provider: Sherri Sear, PA-C    Attending's signature signifies review of the provider note and clinical impression.    This note was generated by the [facility] EMR system/Dragon speech recognition and may contain inherent errors or omissions not intended by the user. Grammatical errors, random word insertions, deletions, pronoun errors and incomplete sentences are occasional consequences of this technology due to software limitations. Not all errors are caught or corrected. If there are questions or concerns about the content of this note or information contained within the body of this dictation they should be addressed directly with the   author for clarification.                     Procedures    Clinical Impression & Disposition     Clinical Impression  Final diagnoses:   None        ED Disposition     None           New Prescriptions    No medications on file                 Sheppard Evens, [place]  08/26/17 1056       Isidoro Donning, MD  08/26/17 [phone]

## 2017-08-27 ENCOUNTER — Telehealth (INDEPENDENT_AMBULATORY_CARE_PROVIDER_SITE_OTHER): Payer: Self-pay | Admitting: Student in an Organized Health Care Education/Training Program

## 2017-08-27 ENCOUNTER — Other Ambulatory Visit (INDEPENDENT_AMBULATORY_CARE_PROVIDER_SITE_OTHER): Payer: Self-pay | Admitting: Student in an Organized Health Care Education/Training Program

## 2017-08-27 MED ORDER — METRONIDAZOLE 0.75 % VA GEL
Freq: Every evening | VAGINAL | 0 refills | Status: AC
Start: 2017-08-27 — End: 2017-09-01

## 2017-08-27 NOTE — Telephone Encounter (Signed)
My chart sent in     She needs to see ortho and gets it cleared by them 1st     I also sent her the ortho info     Colette Ribas DO   Orthopaedic Surgery   [facility]   [address]   [place], [place] [address]UEAVW: [phone]: [phone]      Please inform     Wendy [name] Wendy [name]

## 2017-08-27 NOTE — Telephone Encounter (Signed)
Please review

## 2017-08-27 NOTE — Telephone Encounter (Signed)
Pt stated that she came into the office on 08/23/17 because on the Knee Pain she was experiencing. Pt stated that there was medication prescribed to her to help with the inflammation.     Pt was prescribed the following medication by Dr. Salina April    meloxicam [facility]) 15 MG tablet (Order [id number])     Pt stated that after taking the above medication she has experienced swelling     Pt stated that she went to the ER on 08/25/17. Pt stated that the ER did an X-Ray on her knee and referred her to an orthopedic doctor that was not in her network. Pt stated that she called [facility] and was told by them they wouldn't be able to get her in until Friday.     Pt would like a call back from Dr. Salina April letting her know what she needs to do about the swelling. Pt statd that if it is an orthopedic issue then she is fine but if it is something more serious she would like to get it under control as soon as possible.     Please contact pt with instructions on what she needs to do.     Pt can be reached at: [phone]

## 2017-08-27 NOTE — Telephone Encounter (Signed)
Pt informed she said she is trying to make an appointment today

## 2017-08-28 ENCOUNTER — Ambulatory Visit (INDEPENDENT_AMBULATORY_CARE_PROVIDER_SITE_OTHER): Payer: No Typology Code available for payment source | Admitting: Family Medicine

## 2017-08-29 ENCOUNTER — Ambulatory Visit (INDEPENDENT_AMBULATORY_CARE_PROVIDER_SITE_OTHER): Payer: No Typology Code available for payment source | Admitting: Family Medicine

## 2017-08-29 ENCOUNTER — Encounter (INDEPENDENT_AMBULATORY_CARE_PROVIDER_SITE_OTHER): Payer: Self-pay | Admitting: Family Medicine

## 2017-08-29 VITALS — BP 149/89 | HR 91 | Ht 63.5 in | Wt 179.0 lb

## 2017-08-29 DIAGNOSIS — R609 Edema, unspecified: Secondary | ICD-10-CM

## 2017-08-29 DIAGNOSIS — M25571 Pain in right ankle and joints of right foot: Secondary | ICD-10-CM

## 2017-08-29 DIAGNOSIS — M7050 Other bursitis of knee, unspecified knee: Secondary | ICD-10-CM

## 2017-08-29 DIAGNOSIS — M76899 Other specified enthesopathies of unspecified lower limb, excluding foot: Secondary | ICD-10-CM

## 2017-08-29 DIAGNOSIS — M1711 Unilateral primary osteoarthritis, right knee: Secondary | ICD-10-CM

## 2017-08-29 MED ORDER — TRAMADOL HCL 50 MG PO TABS
100.0000 mg | ORAL_TABLET | Freq: Four times a day (QID) | ORAL | 0 refills | Status: AC | PRN
Start: 2017-08-29 — End: 2017-09-05

## 2017-09-03 ENCOUNTER — Encounter (INDEPENDENT_AMBULATORY_CARE_PROVIDER_SITE_OTHER): Payer: Self-pay

## 2017-09-03 NOTE — Progress Notes (Signed)
ATTNWaynetta Sandy - PT order faxed via epic to [phone]

## 2017-09-04 NOTE — Progress Notes (Signed)
Wendy [name] is a 62 y.o. female self-referred for evaluation and treatment of right knee pain. This is evaluated as a personal injury. The pain began 2 weeks ago. The pain's location is global. The patient describes the symptoms as aching, sharp, shooting, stabbing and throbbing. Symptoms improve with rest, avoiding painful activities. Symptoms worsen with activity. The knee has not given out or felt unstable. The patient can bend and straighten the knee fully. The patient is active in none. Treatment to date has been evaluated in the ER and r/o for DVT, patient does a lot of driving back and forth from [place]. She has also recently been placed on a cholesterol medication.     Outside reports reviewed: historical medical records.    The following portions of the patient's history were reviewed and updated as appropriate: allergies, current medications, past family history, past medical history, past social history, past surgical history and problem list.  Patient's medications, allergies, past medical, surgical, social and family histories were reviewed and updated as appropriate.     Review of systems: History obtained from the patient  General ROS: negative for - chills, fever or night sweats  Hematological and Lymphatic ROS: negative for - bleeding problems, jaundice or pallor  Endocrine ROS: negative for - hot flashes, malaise/lethargy or mood swings  Respiratory ROS: negative for - cough, shortness of breath or wheezing  Cardiovascular ROS: negative for - chest pain, loss of consciousness or shortness of breath  Gastrointestinal ROS: negative for - abdominal pain, appetite loss or nausea/vomiting  Musculoskeletal ROS: positive for - see chief complaint     Objective:     Vitals:    08/29/17 1004   BP: 149/89   Pulse: 91       General :   alert, active, cooperative and in no acute distress   Gait: Normal. The patient can bear weight on the injured extremity.     Right (Affected) Lower Extremity  Hip  ROM: 75% of normal   Hamstring tightness reveals a popliteal angle to be 35 degrees   Effusion:  0-1+   Ecchymosis:  none   Knee ROM:  0 to Normal degrees with subpatellar crepitance.   Patella:  Patella does not track normally.  Patellar apprehension test: negative  Patellar compression test: positive   Tenderness: medial joint line, lateral joint line, medial facet of the patella, lateral facet of the patella and medial tibial plateau   Stability:  Lachman's test: negative  Anterior drawer: negative  Posterior drawer: negative  Medial collateral ligament: negative  Lateral collateral ligament: negative     Ober Test:  negative   McMurray's Test:  negative with no joint line tenderness   Strength: 4/5 for hip flexion, 4/5 for knee flexion, and 4/5 for knee extension   Sensation:   intact to light touch   Pulses: normal DP and PT pulses     Left (Unaffected) Lower Extremity  Hip ROM: 100% of normal   Hamstring tightness reveals a popliteal angle to be 40 degrees   Effusion:  None.   Ecchymosis:  none   Knee ROM:  0 to Normal degrees with subpatellar crepitance.   Patella:  Patella does track normally.  Patellar apprehension test: negative  Patellar compression test: negative   Tenderness: There is no tenderness noted at today's visit   Stability:  Lachman's test: negative  Anterior drawer: negative  Posterior drawer: negative  Medial collateral ligament: negative  Lateral collateral ligament: negative  Ober Test:  negative   McMurray's Test:  negative with no joint line tenderness   Strength: 5/5 for hip flexion, 5/5 for knee flexion, and 5/5 for knee extension   Sensation:   intact to light touch   Pulses: normal DP and PT pulses     Imaging  X-rays were reviewed in EPIC by me.        Assessment:     Encounter Diagnoses   Name Primary?   . Osteoarthritis of right knee, unspecified osteoarthritis type Yes   . Hamstring tendinitis    . Pes anserine bursitis    . Edema, unspecified type    . Pain in joint involving  right ankle and foot           Plan:   Physical Therapy  Activity Modification  NSAIDs  Follow-up in 6-8 weeks  Discussed possible injection therapies  Proper nutrition, hydration, and rest for recovery

## 2017-09-12 ENCOUNTER — Encounter (INDEPENDENT_AMBULATORY_CARE_PROVIDER_SITE_OTHER): Payer: Self-pay | Admitting: Family Medicine

## 2017-09-21 ENCOUNTER — Encounter (INDEPENDENT_AMBULATORY_CARE_PROVIDER_SITE_OTHER): Payer: Self-pay | Admitting: Family Medicine

## 2017-10-01 ENCOUNTER — Telehealth (INDEPENDENT_AMBULATORY_CARE_PROVIDER_SITE_OTHER): Payer: Self-pay | Admitting: Student in an Organized Health Care Education/Training Program

## 2017-10-01 ENCOUNTER — Encounter (INDEPENDENT_AMBULATORY_CARE_PROVIDER_SITE_OTHER): Payer: Self-pay | Admitting: Student in an Organized Health Care Education/Training Program

## 2017-10-01 NOTE — Telephone Encounter (Signed)
Patient calling in requesting to speak to a nurse. Patient requesting a call back at [phone]

## 2017-10-01 NOTE — Telephone Encounter (Signed)
Pt said she has blood in stool she would like to make an appointment this week. I make her an appointment 06/14 at 9.15 am, pt will send detail messages to Dr. B from my chart is she need to do anything until visit

## 2017-10-05 ENCOUNTER — Ambulatory Visit (FREE_STANDING_LABORATORY_FACILITY)
Payer: No Typology Code available for payment source | Admitting: Student in an Organized Health Care Education/Training Program

## 2017-10-05 ENCOUNTER — Encounter (INDEPENDENT_AMBULATORY_CARE_PROVIDER_SITE_OTHER): Payer: Self-pay | Admitting: Student in an Organized Health Care Education/Training Program

## 2017-10-05 VITALS — BP 135/87 | HR 67 | Temp 97.5°F | Resp 13 | Ht 64.0 in | Wt 178.4 lb

## 2017-10-05 DIAGNOSIS — K625 Hemorrhage of anus and rectum: Secondary | ICD-10-CM

## 2017-10-05 DIAGNOSIS — I10 Essential (primary) hypertension: Secondary | ICD-10-CM

## 2017-10-05 DIAGNOSIS — R7309 Other abnormal glucose: Secondary | ICD-10-CM

## 2017-10-05 DIAGNOSIS — E782 Mixed hyperlipidemia: Secondary | ICD-10-CM

## 2017-10-05 LAB — CBC AND DIFFERENTIAL
Absolute NRBC: 0 10*3/uL (ref 0.00–0.00)
Basophils Absolute Automated: 0.05 10*3/uL (ref 0.00–0.08)
Basophils Automated: 1.2 %
Eosinophils Absolute Automated: 0.19 10*3/uL (ref 0.00–0.44)
Eosinophils Automated: 4.6 %
Hematocrit: 38.3 % (ref 34.7–43.7)
Hgb: 12.1 g/dL (ref 11.4–14.8)
Immature Granulocytes Absolute: 0 10*3/uL (ref 0.00–0.07)
Immature Granulocytes: 0 %
Lymphocytes Absolute Automated: 1.88 10*3/uL (ref 0.42–3.22)
Lymphocytes Automated: 45.2 %
MCH: 28.5 pg (ref 25.1–33.5)
MCHC: 31.6 g/dL (ref 31.5–35.8)
MCV: 90.1 fL (ref 78.0–96.0)
MPV: 10.3 fL (ref 8.9–12.5)
Monocytes Absolute Automated: 0.54 10*3/uL (ref 0.21–0.85)
Monocytes: 13 %
Neutrophils Absolute: 1.5 10*3/uL (ref 1.10–6.33)
Neutrophils: 36 %
Nucleated RBC: 0 /100 WBC (ref 0.0–0.0)
Platelets: 420 10*3/uL — ABNORMAL HIGH (ref 142–346)
RBC: 4.25 10*6/uL (ref 3.90–5.10)
RDW: 14 % (ref 11–15)
WBC: 4.16 10*3/uL (ref 3.10–9.50)

## 2017-10-05 LAB — LIPID PANEL
Cholesterol / HDL Ratio: 5.5
Cholesterol: 238 mg/dL — ABNORMAL HIGH (ref 0–199)
HDL: 43 mg/dL (ref 40–9999)
LDL Calculated: 172 mg/dL — ABNORMAL HIGH (ref 0–99)
Triglycerides: 116 mg/dL (ref 34–149)
VLDL Calculated: 23 mg/dL (ref 10–40)

## 2017-10-05 LAB — COMPREHENSIVE METABOLIC PANEL
ALT: 15 U/L (ref 0–55)
AST (SGOT): 21 U/L (ref 5–34)
Albumin/Globulin Ratio: 1.2 (ref 0.9–2.2)
Albumin: 4 g/dL (ref 3.5–5.0)
Alkaline Phosphatase: 79 U/L (ref 37–106)
BUN: 15 mg/dL (ref 7.0–19.0)
Bilirubin, Total: 0.3 mg/dL (ref 0.2–1.2)
CO2: 26 mEq/L (ref 21–29)
Calcium: 9.8 mg/dL (ref 8.5–10.5)
Chloride: 103 mEq/L (ref 100–111)
Creatinine: 0.7 mg/dL (ref 0.4–1.5)
Globulin: 3.3 g/dL (ref 2.0–3.7)
Glucose: 90 mg/dL (ref 70–100)
Potassium: 4 mEq/L (ref 3.5–5.1)
Protein, Total: 7.3 g/dL (ref 6.0–8.3)
Sodium: 140 mEq/L (ref 136–145)

## 2017-10-05 LAB — HEMOGLOBIN A1C
Average Estimated Glucose: 125.5 mg/dL
Hemoglobin A1C: 6 % — ABNORMAL HIGH (ref 4.6–5.9)

## 2017-10-05 LAB — HEMOLYSIS INDEX: Hemolysis Index: 29 — ABNORMAL HIGH (ref 0–18)

## 2017-10-05 LAB — GFR: EGFR: >60

## 2017-10-05 NOTE — Progress Notes (Signed)
Have you seen any specialists/other providers since your last visit with us?    No    Arm preference verified?   Yes    The patient is due for shingles vaccine and HEP C, [facility]

## 2017-10-05 NOTE — Progress Notes (Signed)
Chief Complaint   Patient presents with   . Abdominal Cramping     when she was in [place] she felt scik here for FU   . Rectal Bleeding         Wendy [name] is a 62 y.o. female who presents for evaluation of  Above problem   She C/O mild Abdominal Pain/occasionally bloody BM/and diarrhea , started last week and lasted for 3-4 days, Sx have completely resolved , Sx started after a trip to [place] , UTD with colonoscopy :last year and was nl ,  Thought to be  Related to Food, No fever or chills  No recent trip   No sick contact     Pt's email      , I was in [place] last week and became very ill on Tuesday afternoon for some reason sweating diahrea and heathing no vomiting. After this went on for about 30 minutes I figured I had some sort of food poisoning but did not seek medical attention because I felt better. After I woke up the next morning I had to go to the bathroom again and I figured there   nothing in my system to come out but I felt like I had to do #2 and blood came out. After that nothing else came out but on yesterday Sunday I had another movement and I purposely checked to see if there was any blood and there was a line of blood in the bm but was formed not loose and also today but not a lot        Hx HLD  Compliant with cholesterol medication Zetia   + Myalgias,  Muscle aches , bodyaches due to statin   Compliant with low saturated and low carbohydrate Diet  No hx abnormal liver test related to statin therapy      Hypertension: occasionally compliant    - The patient feels this issue is  controlled.  - The patient has been monitoring blood pressure.  - The patient has  been adhering to the recommended no-added-salt diet.  - The patient has not been compliant to the recommended exercise program.  - The patient is  tolerating the current medication regimen well.  - The patient denies orthostasis.  - The patient denies chest pain.  - The patient denies dyspnea.  - The patient denies edema.      Past  Medical History:   Diagnosis Date   . Brain tumor (benign)    . Hypercholesteremia    . Hypertensive disorder       Past Surgical History:   Procedure Laterality Date   . BRAIN SURGERY  2009   . BREAST BIOPSY Right 2008    right breast biopsy- benign   . BREAST BIOPSY Right 1970s    right breast tumor removed 40+ years ago - benign   . BREAST CYST EXCISION  1970s    right breast tumor removed 40+ years ago - benign   . CESAREAN SECTION     . CORNEAL TRANSPLANT     . EYE SURGERY     . TUBAL LIGATION          (Not in a hospital admission)  Current/Home Medications    ACETAZOLAMIDE (DIAMOX) 250 MG TABLET        CO-ENZYME Q-10 50 MG CAPSULE    Take 50 mg by mouth daily.    DIFLUPREDNATE (DUREZOL) 0.05 % EMULSION OPHTHALMIC SOLUTION    Place 1 drop into the  left eye daily.     FLUTICASONE (FLONASE) 50 MCG/ACT NASAL SPRAY        LISINOPRIL-HYDROCHLOROTHIAZIDE (PRINZIDE,ZESTORETIC) 10-12.5 MG PER TABLET    TAKE 1 TABLET BY MOUTH DAILY    MULTIPLE VITAMIN (MULTIVITAMIN) TABLET    Take 1 tablet by mouth daily.    NORTRIPTYLINE (PAMELOR) 10 MG CAPSULE        TRIAMCINOLONE (KENALOG) 0.025 % OINTMENT    Apply topically 2 (two) times daily    VITAMIN D, ERGOCALCIFEROL, (DRISDOL) 50000 UNIT CAP    Take 1 capsule (50,000 Units total) by mouth once a week     Allergies   Allergen Reactions   . Atorvastatin      Joint pain    . Crestor [Rosuvastatin]      Throat swelling after Lisinopril was added, previous P[CP Stopped Crestor and Pt has been doing fine    . Iodine    . Penicillins    . Tetanus Immune Globulin      Arm swelling    . Zocor [Simvastatin]      No response       Social History   Substance Use Topics   . Smoking status: Never Smoker   . Smokeless tobacco: Never Used   . Alcohol use No      Family History   Problem Relation Age of Onset   . Diabetes Mother    . Hypertension Mother    . Diabetes Father    . Hypertension Father    . Breast cancer Neg Hx           Review of Systems - General ROS: negative for - chills,  fatigue, fever or malaise  Hematological and Lymphatic ROS: negative for - fatigue, jaundice or weight loss  Gastrointestinal ROS: no abdominal pain, change in bowel habits, or black or bloody stools  Genito-Urinary ROS: no dysuria, trouble voiding, or hematuria  Other ROS were reviewed and were neg       Vitals:    10/05/17 0923   BP: 135/87   Pulse: 67   Resp: 13   Temp: 97.5 F (36.4 C)   SpO2: 98%       Physical Examination: General appearance - alert, well appearing, and in no distress and oriented to person, place, and time  Eyes - pupils equal and reactive, extraocular eye movements intact, no icterus  Mouth - mucous membranes moist, pharynx normal without lesions, tonsils normal and no thrush  Chest - clear to auscultation, no wheezes, rales or rhonchi, symmetric air entry  Heart - normal rate, regular rhythm, normal S1, S2, no murmurs, rubs, clicks or gallops  Abdomen - soft, nontender, nondistended, no masses or organomegaly  Extremities - peripheral pulses normal, no pedal edema, no clubbing or cyanosis      A/P       1. Blood sugar increased  Lipid panel    Comprehensive metabolic panel    CBC and differential    Hemoglobin A1C   2. Essential hypertension  Lipid panel    Comprehensive metabolic panel    CBC and differential    Hemoglobin A1C   3. Mixed hyperlipidemia  Lipid panel    Comprehensive metabolic panel    CBC and differential    Hemoglobin A1C   4. Rectal bleed  Lipid panel    Comprehensive metabolic panel    CBC and differential    Hemoglobin A1C     FOBT : neg  Body mass index is 30.62 kg/m.  HIgh BMI Follow Up   BMI Follow Up Care Plan Documented   Encouragement to Exercise       Risk & Benefits of the new medication(s) were explained to the patient who verbalized understanding & agreed to the treatment plan    Phylliss Bob MD

## 2017-10-10 ENCOUNTER — Encounter (INDEPENDENT_AMBULATORY_CARE_PROVIDER_SITE_OTHER): Payer: Self-pay | Admitting: Student in an Organized Health Care Education/Training Program

## 2017-10-10 ENCOUNTER — Other Ambulatory Visit (INDEPENDENT_AMBULATORY_CARE_PROVIDER_SITE_OTHER): Payer: Self-pay | Admitting: Student in an Organized Health Care Education/Training Program

## 2017-10-10 MED ORDER — ROSUVASTATIN CALCIUM 40 MG PO TABS
ORAL_TABLET | ORAL | 5 refills | Status: DC
Start: 2017-10-10 — End: 2017-10-22

## 2017-10-12 ENCOUNTER — Encounter (INDEPENDENT_AMBULATORY_CARE_PROVIDER_SITE_OTHER): Payer: Self-pay

## 2017-10-22 ENCOUNTER — Other Ambulatory Visit (INDEPENDENT_AMBULATORY_CARE_PROVIDER_SITE_OTHER): Payer: Self-pay | Admitting: Student in an Organized Health Care Education/Training Program

## 2017-10-22 MED ORDER — ROSUVASTATIN CALCIUM 40 MG PO TABS
ORAL_TABLET | ORAL | 0 refills | Status: DC
Start: 2017-10-22 — End: 2018-04-10

## 2017-10-22 NOTE — Telephone Encounter (Signed)
Insurance request to 90 days supply     Thanks

## 2017-10-23 ENCOUNTER — Encounter (INDEPENDENT_AMBULATORY_CARE_PROVIDER_SITE_OTHER): Payer: Self-pay | Admitting: Family Medicine

## 2017-10-31 ENCOUNTER — Encounter (INDEPENDENT_AMBULATORY_CARE_PROVIDER_SITE_OTHER): Payer: Self-pay | Admitting: Family Medicine

## 2018-02-11 ENCOUNTER — Encounter (HOSPITAL_COMMUNITY): Payer: Self-pay | Admitting: Emergency Medicine

## 2018-02-11 ENCOUNTER — Emergency Department (HOSPITAL_COMMUNITY)
Admission: EM | Admit: 2018-02-11 | Discharge: 2018-02-11 | Disposition: A | Discharge status: Home / Self Care | Payer: Medicare Other | Attending: Emergency Medicine | Admitting: Emergency Medicine

## 2018-02-11 ENCOUNTER — Emergency Department (HOSPITAL_COMMUNITY): Payer: Medicare Other

## 2018-02-11 DIAGNOSIS — R0789 Other chest pain: Secondary | ICD-10-CM | POA: Diagnosis present

## 2018-02-11 DIAGNOSIS — Z79899 Other long term (current) drug therapy: Secondary | ICD-10-CM | POA: Diagnosis not present

## 2018-02-11 DIAGNOSIS — I10 Essential (primary) hypertension: Secondary | ICD-10-CM | POA: Diagnosis not present

## 2018-02-11 DIAGNOSIS — K2970 Gastritis, unspecified, without bleeding: Secondary | ICD-10-CM | POA: Diagnosis not present

## 2018-02-11 LAB — I-STAT TROPONIN, ED
TROPONIN I, POC: 0.01 ng/mL (ref 0.00–0.08)
Troponin i, poc: 0 ng/mL (ref 0.00–0.08)

## 2018-02-11 LAB — BASIC METABOLIC PANEL
Anion gap: 11 (ref 5–15)
BUN: 11 mg/dL (ref 8–23)
CHLORIDE: 102 mmol/L (ref 98–111)
CO2: 27 mmol/L (ref 22–32)
Calcium: 9.6 mg/dL (ref 8.9–10.3)
Creatinine, Ser: 0.67 mg/dL (ref 0.44–1.00)
GFR calc Af Amer: >60 mL/min (ref >60)
GFR calc non Af Amer: >60 mL/min (ref >60)
Glucose, Bld: 108 mg/dL — ABNORMAL HIGH (ref 70–99)
POTASSIUM: 3.9 mmol/L (ref 3.5–5.1)
Sodium: 140 mmol/L (ref 135–145)

## 2018-02-11 LAB — CBC
HEMATOCRIT: 40.2 % (ref 36.0–46.0)
Hemoglobin: 12.4 g/dL (ref 12.0–15.0)
MCH: 27.6 pg (ref 26.0–34.0)
MCHC: 30.8 g/dL (ref 30.0–36.0)
MCV: 89.5 fL (ref 80.0–100.0)
NRBC: 0 % (ref 0.0–0.2)
PLATELETS: 382 10*3/uL (ref 150–400)
RBC: 4.49 MIL/uL (ref 3.87–5.11)
RDW: 14 % (ref 11.5–15.5)
WBC: 3.9 10*3/uL — AB (ref 4.0–10.5)

## 2018-02-11 MED ORDER — FAMOTIDINE 20 MG PO TABS
20.0000 mg | ORAL_TABLET | Freq: Once | ORAL | Status: AC
  Administered 2018-02-11: 20 mg via ORAL
  Filled 2018-02-11: qty 1

## 2018-02-11 MED ORDER — ACETAMINOPHEN 325 MG PO TABS
650.0000 mg | ORAL_TABLET | Freq: Once | ORAL | Status: AC
  Administered 2018-02-11: 650 mg via ORAL
  Filled 2018-02-11: qty 2

## 2018-02-11 NOTE — ED Notes (Signed)
Patient transported to X-ray 

## 2018-02-11 NOTE — ED Provider Notes (Signed)
[facility] EMERGENCY DEPARTMENT Provider Note   CSN: [id number] Arrival date & time: 02/11/18  0932     History   Chief Complaint Chief Complaint  Patient presents with  . Chest Pain    HPI EMILA STEINHAUSER is a 62 y.o. female with a past medical history of hypertension, hyperlipidemia, who presents to ED for evaluation of centralized, pressure sensation in her chest.  Denies pleuritic chest pain.  States that 2 days ago before she went to bed, she began having a sharp pain in her chest.  States that she "shrugged it off" and did improve when she woke up the next morning.  However, yesterday she states that she was more fatigued than usual while attending church.  She began having the pain several hours ago.  She did not take any medications prior to arrival to help with symptoms.  She was concerned because she has never felt this pain before.  She denies any history of MI, DVT or PE, recent immobilization, hormone use, history of cancer, leg swelling, URI symptoms, abdominal pain, vomiting, shortness of breath or hemoptysis.  She does note taking care of her daughter who is suffering from pneumonia.  Also states that she is under a significant amount of stress. She last saw cardiologist 2 years ago and underwent a normal stress test.  She receives most of her medical care in [place].  HPI  Past Medical History:  Diagnosis Date  . High cholesterol   . Hypertension     There are no active problems to display for this patient.   Past Surgical History:  Procedure Laterality Date  . BRAIN SURGERY    . EYE SURGERY       OB History   None      Home Medications    Prior to Admission medications   Medication Sig Start Date End Date Taking? Authorizing Provider  acetaZOLAMIDE (DIAMOX) 125 MG tablet Take 125 mg by mouth at bedtime as needed.    Yes [provider]  co-enzyme Q-10 30 MG capsule Take 30 mg by mouth every other day.   Yes  [provider]  Difluprednate (DUREZOL) 0.05 % EMUL Place 1 drop into the left eye daily.    Yes [provider]  Iron-Vitamins (GERITOL COMPLETE PO) Take 1 tablet by mouth every other day.   Yes [provider]  lisinopril-hydrochlorothiazide (PRINZIDE,ZESTORETIC) 10-12.5 MG tablet Take 1 tablet by mouth daily.   Yes [provider]  nortriptyline (PAMELOR) 10 MG capsule Take 10 mg by mouth at bedtime as needed.    Yes [provider]  rosuvastatin (CRESTOR) 40 MG tablet Take 40 mg by mouth at bedtime. 10/22/17  Yes [provider]  Vitamin D, Ergocalciferol, (DRISDOL) 50000 units CAPS capsule Take 50,000 Units by mouth every 7 (seven) days.   Yes [provider]  azithromycin (ZITHROMAX) 250 MG tablet Take 1 tablet (250 mg total) by mouth daily. Take first 2 tablets together, then 1 every day until finished. Patient not taking: Reported on 02/11/2018 07/16/17   Belinda Fisher, PA-C  benzonatate (TESSALON) 100 MG capsule Take 1 capsule (100 mg total) by mouth every 8 (eight) hours. Patient not taking: Reported on 02/11/2018 07/16/17   Belinda Fisher, PA-C  HYDROcodone-homatropine [facility]) 5-1.5 MG/5ML syrup Take 5 mLs by mouth every 6 (six) hours as needed for cough. Patient not taking: Reported on 02/11/2018 07/02/17   Mardella Layman, MD  ipratropium (ATROVENT) 0.06 %  nasal spray Place 2 sprays into both nostrils 4 (four) times daily. Patient not taking: Reported on 02/11/2018 07/16/17   Lurline Idol    Family History No family history on file.  Social History Social History   Tobacco Use  . Smoking status: Never Smoker  Substance Use Topics  . Alcohol use: No    Frequency: Never  . Drug use: Not on file     Allergies   Atorvastatin; Iodine; Penicillins; Rosuvastatin; Simvastatin; Shellfish allergy; and Tetanus toxoids   Review of Systems Review of Systems  Constitutional: Negative for appetite change, chills and fever.    HENT: Negative for ear pain, rhinorrhea, sneezing and sore throat.   Eyes: Negative for photophobia and visual disturbance.  Respiratory: Negative for cough, chest tightness, shortness of breath and wheezing.   Cardiovascular: Positive for chest pain. Negative for palpitations.  Gastrointestinal: Negative for abdominal pain, blood in stool, constipation, diarrhea, nausea and vomiting.  Genitourinary: Negative for dysuria, hematuria and urgency.  Musculoskeletal: Negative for myalgias.  Skin: Negative for rash.  Neurological: Negative for dizziness, weakness and light-headedness.     Physical Exam Updated Vital Signs BP (!) 158/89   Pulse 66   Temp 98 F (36.7 C)   Resp 15   Ht 5\' 3"  (1.6 m)   Wt 76.2 kg   SpO2 100%   BMI 29.76 kg/m   Physical Exam  Constitutional: She appears well-developed and well-nourished. No distress.  HENT:  Head: Normocephalic and atraumatic.  Nose: Nose normal.  Eyes: Conjunctivae and EOM are normal. Right eye exhibits no discharge. Left eye exhibits no discharge. No scleral icterus.  Neck: Normal range of motion. Neck supple.  Cardiovascular: Normal rate, regular rhythm, normal heart sounds and intact distal pulses. Exam reveals no gallop and no friction rub.  No murmur heard. Pulmonary/Chest: Effort normal and breath sounds normal. No respiratory distress. She exhibits tenderness.    Abdominal: Soft. Bowel sounds are normal. She exhibits no distension. There is no tenderness. There is no guarding.  Musculoskeletal: Normal range of motion. She exhibits no edema.  No lower extremity edema, erythema or calf tenderness bilaterally.  Neurological: She is alert. She exhibits normal muscle tone. Coordination normal.  Skin: Skin is warm and dry. No rash noted.  Psychiatric: She has a normal mood and affect.  Nursing note and vitals reviewed.    ED Treatments / Results  Labs (all labs ordered are listed, but only abnormal results are  displayed) Labs Reviewed  BASIC METABOLIC PANEL - Abnormal; Notable for the following components:      Result Value   Glucose, Bld 108 (*)    All other components within normal limits  CBC - Abnormal; Notable for the following components:   WBC 3.9 (*)    All other components within normal limits  I-STAT TROPONIN, ED  I-STAT TROPONIN, ED    EKG EKG Interpretation  Date/Time:  Monday February 11 2018 09:35:55 EDT Ventricular Rate:  66 PR Interval:  138 QRS Duration: 88 QT Interval:  408 QTC Calculation: 427 R Axis:   57 Text Interpretation:  Normal sinus rhythm Normal ECG Confirmed by Tilden Fossa [phone]) on 02/11/2018 9:58:47 AM   Radiology Dg Chest 2 View  Result Date: 02/11/2018 CLINICAL DATA:  62 year old female with chest pain. Left arm numbness and diaphoresis. EXAM: CHEST - 2 VIEW COMPARISON:  None. FINDINGS: Mildly low lung volumes. Normal cardiac size and mediastinal contours. Visualized tracheal air column is within normal limits. The lungs  are clear. No pneumothorax or pleural effusion. No acute osseous abnormality identified. Negative visible bowel gas pattern. IMPRESSION: Negative aside from mildly low lung volumes. Electronically Signed   By: Odessa Fleming M.D.   On: 02/11/2018 10:20    Procedures Procedures (including critical care time)  Medications Ordered in ED Medications  acetaminophen (TYLENOL) tablet 650 mg (650 mg Oral Given 02/11/18 1105)  famotidine (PEPCID) tablet 20 mg (20 mg Oral Given 02/11/18 1105)     Initial Impression / Assessment and Plan / ED Course  I have reviewed the triage vital signs and the nursing notes.  Pertinent labs & imaging results that were available during my care of the patient were reviewed by me and considered in my medical decision making (see chart for details).  Clinical Course as of Feb 12 1420  Mon Feb 11, 2018  1030 Low risk by Anner Crete score (0).   [HK]  1321 Patient reports improvement in her symptoms with  Tylenol and Pepcid given.  States that the "gassy feeling" in her stomach has improved.   [HK]    Clinical Course User Index [HK] Dietrich Pates, PA-C    62 year old female presents to ED for evaluation of centralized, pressure sensation in chest.  She has history of hypertension, hyperlipidemia.  Pain began 2 days ago and went away on its own.  She began having pain several hours prior to arrival.  She is concerned because she never had the pain before.  She does report increased fatigue yesterday.  Denies any shortness of breath, hemoptysis, URI symptoms, abdominal pain.  However, after initial evaluation patient does report a "gassy feeling" in her abdomen.  She does note that she is under a significant amount of stress.  She lost her cardiologist 2 years ago with negative stress test.  She receives most of her care in [place].  She is currently taking care of her daughter with pneumonia here.  Physical exam findings notable for chest pain reproducible with palpation.  No abdominal tenderness palpation.  No lower extremity edema or calf tenderness to concern me for DVT.  Patient is low risk by Wells score for PE.  EKG shows normal sinus rhythm.  CBC, BMP unremarkable.  Initial and delta troponin are both negative.  Chest x-ray is unremarkable.  Patient was given Tylenol and Pepcid with significant improvement in her symptoms.  She is comfortable with discharge home and following up with her PCP in [place].  Doubt cardiac or pulmonary cause of symptoms.  Portions of this note were generated with Scientist, clinical (histocompatibility and immunogenetics). Dictation errors may occur despite best attempts at proofreading.   Final Clinical Impressions(s) / ED Diagnoses   Final diagnoses:  Chest wall pain  Gastritis without bleeding, unspecified chronicity, unspecified gastritis type    ED Discharge Orders    None       Dietrich Pates, PA-C 02/11/18 1421    Tilden Fossa, MD 02/12/18 1601

## 2018-02-11 NOTE — Discharge Instructions (Addendum)
Return to ED for worsening symptoms, severe chest pain or abdominal pain, vomiting or coughing up blood, lightheadedness, loss of consciousness.

## 2018-02-11 NOTE — ED Notes (Signed)
Pt stable, ambulatory, states understanding of discharge instructions 

## 2018-02-11 NOTE — ED Triage Notes (Signed)
Pt states she woke up from sleep with chest pain to mid chest radiating into the left arm. No associated symptoms. The pain is intermittent, currently 8/10.

## 2018-04-04 ENCOUNTER — Ambulatory Visit (INDEPENDENT_AMBULATORY_CARE_PROVIDER_SITE_OTHER): Payer: No Typology Code available for payment source | Admitting: Family

## 2018-04-04 ENCOUNTER — Encounter (INDEPENDENT_AMBULATORY_CARE_PROVIDER_SITE_OTHER): Payer: Self-pay | Admitting: Family

## 2018-04-04 VITALS — BP 122/80 | HR 68 | Temp 97.4°F | Ht 63.0 in | Wt 170.0 lb

## 2018-04-04 DIAGNOSIS — Z833 Family history of diabetes mellitus: Secondary | ICD-10-CM

## 2018-04-04 DIAGNOSIS — Z0000 Encounter for general adult medical examination without abnormal findings: Secondary | ICD-10-CM

## 2018-04-04 DIAGNOSIS — Z1159 Encounter for screening for other viral diseases: Secondary | ICD-10-CM

## 2018-04-04 DIAGNOSIS — M542 Cervicalgia: Secondary | ICD-10-CM

## 2018-04-04 DIAGNOSIS — E559 Vitamin D deficiency, unspecified: Secondary | ICD-10-CM

## 2018-04-04 DIAGNOSIS — Z1239 Encounter for other screening for malignant neoplasm of breast: Secondary | ICD-10-CM

## 2018-04-04 LAB — COMPREHENSIVE METABOLIC PANEL
ALT: 19 U/L (ref 0–55)
AST (SGOT): 20 U/L (ref 5–34)
Albumin/Globulin Ratio: 1.1 (ref 0.9–2.2)
Albumin: 4.1 g/dL (ref 3.5–5.0)
Alkaline Phosphatase: 83 U/L (ref 37–106)
BUN: 14 mg/dL (ref 7.0–19.0)
Bilirubin, Total: 0.3 mg/dL (ref 0.2–1.2)
CO2: 27 mEq/L (ref 21–29)
Calcium: 9.5 mg/dL (ref 8.5–10.5)
Chloride: 107 mEq/L (ref 100–111)
Creatinine: 0.8 mg/dL (ref 0.4–1.5)
Globulin: 3.6 g/dL (ref 2.0–3.7)
Glucose: 100 mg/dL (ref 70–100)
Potassium: 3.9 mEq/L (ref 3.5–5.1)
Protein, Total: 7.7 g/dL (ref 6.0–8.3)
Sodium: 142 mEq/L (ref 136–145)

## 2018-04-04 LAB — URINALYSIS
Bilirubin, UA: NEGATIVE
Blood, UA: NEGATIVE
Glucose, UA: NEGATIVE
Ketones UA: NEGATIVE
Leukocyte Esterase, UA: NEGATIVE
Nitrite, UA: NEGATIVE
Protein, UR: NEGATIVE
Specific Gravity UA: 1.012 (ref 1.001–1.035)
Urine pH: 6 (ref 5.0–8.0)
Urobilinogen, UA: 0.2 (ref 0.2–2.0)

## 2018-04-04 LAB — CBC AND DIFFERENTIAL
Absolute NRBC: 0 10*3/uL (ref 0.00–0.00)
Basophils Absolute Automated: 0.05 10*3/uL (ref 0.00–0.08)
Basophils Automated: 1.1 %
Eosinophils Absolute Automated: 0.2 10*3/uL (ref 0.00–0.44)
Eosinophils Automated: 4.6 %
Hematocrit: 40 % (ref 34.7–43.7)
Hgb: 12.6 g/dL (ref 11.4–14.8)
Immature Granulocytes Absolute: 0.01 10*3/uL (ref 0.00–0.07)
Immature Granulocytes: 0.2 %
Lymphocytes Absolute Automated: 2.06 10*3/uL (ref 0.42–3.22)
Lymphocytes Automated: 47.2 %
MCH: 28.4 pg (ref 25.1–33.5)
MCHC: 31.5 g/dL (ref 31.5–35.8)
MCV: 90.3 fL (ref 78.0–96.0)
MPV: 10.1 fL (ref 8.9–12.5)
Monocytes Absolute Automated: 0.5 10*3/uL (ref 0.21–0.85)
Monocytes: 11.5 %
Neutrophils Absolute: 1.54 10*3/uL (ref 1.10–6.33)
Neutrophils: 35.4 %
Nucleated RBC: 0 /100 WBC (ref 0.0–0.0)
Platelets: 384 10*3/uL — ABNORMAL HIGH (ref 142–346)
RBC: 4.43 10*6/uL (ref 3.90–5.10)
RDW: 14 % (ref 11–15)
WBC: 4.36 10*3/uL (ref 3.10–9.50)

## 2018-04-04 LAB — TSH: TSH: 1.1 u[IU]/mL (ref 0.35–4.94)

## 2018-04-04 LAB — LIPID PANEL
Cholesterol / HDL Ratio: 3.2
Cholesterol: 172 mg/dL (ref 0–199)
HDL: 54 mg/dL (ref 40–9999)
LDL Calculated: 106 mg/dL — ABNORMAL HIGH (ref 0–99)
Triglycerides: 58 mg/dL (ref 34–149)
VLDL Calculated: 12 mg/dL (ref 10–40)

## 2018-04-04 LAB — VITAMIN D,25 OH,TOTAL: Vitamin D, 25 OH, Total: 24 ng/mL — ABNORMAL LOW (ref 30–100)

## 2018-04-04 LAB — GFR: EGFR: >60

## 2018-04-04 LAB — HEMOLYSIS INDEX: Hemolysis Index: 4 (ref 0–18)

## 2018-04-04 LAB — HEMOGLOBIN A1C
Average Estimated Glucose: 122.6 mg/dL
Hemoglobin A1C: 5.9 % (ref 4.6–5.9)

## 2018-04-04 LAB — HEPATITIS C ANTIBODY: Hepatitis C, AB: NONREACTIVE

## 2018-04-04 LAB — T4, FREE: T4 Free: 0.84 ng/dL (ref 0.70–1.48)

## 2018-04-04 NOTE — Progress Notes (Signed)
Subjective:      Date: 04/04/2018 10:13 AM   Patient ID: Wendy [name] is a 62 y.o. female.    Chief Complaint:  Chief Complaint   Patient presents with   . Annual Exam       HPI  She is here for her physicals .She is eating good ,sleeping good and exercising good.  Discussed shingrix vaccine  Pap- done in 01/2016 .reports it was normal  colonoscopy - march 2018 and pt reports it is normal  Allergic to tetanus vaccine    Neck pain  She is having neck pain for past 6 months. Pain is mostly in the morning and is relieved by activity and massage. Some times she take ibuprofen for pain. The pain does not radiate. There is no numbness or tingling. There is no trauma.she rates her pain like 8/10. The pain is waxing and weaning.  Visit Type: Health Maintenance Visit  Work Status: retired  Reported Health: good health  Reported Diet: moderate compliance with well-balanced diet  Reported Exercise: regularly  Dental: dentist visit within 6-12 months  Vision: glasses and regular eye exams   Hearing: normal hearing  Immunization Status: allergic to tdap  and Shingles vaccination due  Reproductive Health: post menopausal  Prior Screening Tests: colonoscopy within past 5 years, pap smear within past 1-3 years and mammogram 1 year ago  General Health Risks: no family history of prostate cancer, no family history of colon cancer and no family history of breast cancer  Safety Elements Used: uses seat belts and smoke detectors in household    Problem List:  Patient Active Problem List   Diagnosis   . Fuchs' corneal dystrophy   . Status post corneal transplant   . Pseudophakia of left eye   . Nuclear sclerosis of right eye   . Dry eyes, bilateral   . Blepharitis of both eyes   . post DSEK, OS   . Refractive error   . Corneal graft rejection   . ERM OS (epiretinal membrane, left eye)   . PVD (posterior vitreous detachment)   . OC (onychocryptosis)   . Acquired keratoderma   . Sciatica associated with disorder of lumbosacral spine   .  Acute pain of left knee       Current Medications:  Current Outpatient Medications   Medication Sig Dispense Refill   . co-enzyme Q-10 50 MG capsule Take 50 mg by mouth daily.     . cycloSPORINE (RESTASIS) 0.05 % ophthalmic emulsion Apply 1 drop to eye daily     . difluprednate (DUREZOL) 0.05 % Emulsion ophthalmic solution Place 1 drop into the left eye daily.      . fluticasone (FLONASE) 50 MCG/ACT nasal spray      . lisinopril-hydroCHLOROthiazide (PRINZIDE,ZESTORETIC) 10-12.5 MG per tablet TAKE 1 TABLET BY MOUTH DAILY 90 tablet 3   . Multiple Vitamin (MULTIVITAMIN) tablet Take 1 tablet by mouth daily.     . nortriptyline (PAMELOR) 10 MG capsule as needed        . rosuvastatin (CRESTOR) 40 MG tablet 1 tab qod 90 tablet 0   . triamcinolone (KENALOG) 0.025 % ointment Apply topically 2 (two) times daily (Patient taking differently: Apply topically as needed   ) 80 g 1   . vitamin D, ergocalciferol, (DRISDOL) 50000 UNIT Cap Take 1 capsule (50,000 Units total) by mouth once a week 12 capsule 3   . acetaZOLAMIDE (DIAMOX) 125 MG tablet Take 250 mg by mouth as needed  No current facility-administered medications for this visit.        Allergies:  Allergies   Allergen Reactions   . Iodine Shortness Of Breath   . Penicillins Shortness Of Breath     Has patient had a PCN reaction causing immediate rash, facial/tongue/throat swelling, SOB or lightheadedness with hypotension: Yes  Has patient had a PCN reaction causing severe rash involving mucus membranes or skin necrosis: No  Has patient had a PCN reaction that required hospitalization: No  Has patient had a PCN reaction occurring within the last 10 years: Yes  If all of the above answers are "NO", then may proceed with Cephalosporin use.   . Rosuvastatin Shortness Of Breath     Throat swelling after Lisinopril was added, previous P[CP Stopped Crestor and Pt has been doing fine   Throat swelling after Lisinopril was added, previous P[CP Stopped Crestor and Pt has been  doing fine   Throat swelling after Lisinopril was added, previous P[CP Stopped Crestor and Pt has been doing fine    . Shellfish-Derived Products Anaphylaxis   . Atorvastatin      Joint pain    . Tetanus Immune Globulin      Arm swelling    . Zocor [Simvastatin]      No response        Past Medical History:  Past Medical History:   Diagnosis Date   . Brain tumor (benign)    . Hypercholesteremia    . Hypertensive disorder        Past Surgical History:  Past Surgical History:   Procedure Laterality Date   . BRAIN SURGERY  2009   . BREAST BIOPSY Right 2008    right breast biopsy- benign   . BREAST BIOPSY Right 1970s    right breast tumor removed 40+ years ago - benign   . BREAST CYST EXCISION  1970s    right breast tumor removed 40+ years ago - benign   . CESAREAN SECTION     . CORNEAL TRANSPLANT     . EYE SURGERY     . TUBAL LIGATION         Family History:  Family History   Problem Relation Age of Onset   . Diabetes Mother    . Hypertension Mother    . Diabetes Father    . Hypertension Father    . Breast cancer Neg Hx        Social History:  Social History     Socioeconomic History   . Marital status: Divorced     Spouse name: Not on file   . Number of children: Not on file   . Years of education: Not on file   . Highest education level: Not on file   Occupational History   . Not on file   Social Needs   . Financial resource strain: Not on file   . Food insecurity:     Worry: Not on file     Inability: Not on file   . Transportation needs:     Medical: Not on file     Non-medical: Not on file   Tobacco Use   . Smoking status: Never Smoker   . Smokeless tobacco: Never Used   Substance and Sexual Activity   . Alcohol use: Yes     Alcohol/week: 1.0 standard drinks     Types: 1 Glasses of wine per week     Frequency: Monthly or less     Binge  frequency: Never   . Drug use: No   . Sexual activity: Not Currently     Partners: Male     Birth control/protection: None   Lifestyle   . Physical activity:     Days per week: Not  on file     Minutes per session: Not on file   . Stress: Not on file   Relationships   . Social connections:     Talks on phone: Not on file     Gets together: Not on file     Attends religious service: Not on file     Active member of club or organization: Not on file     Attends meetings of clubs or organizations: Not on file     Relationship status: Not on file   . Intimate partner violence:     Fear of current or ex partner: Not on file     Emotionally abused: Not on file     Physically abused: Not on file     Forced sexual activity: Not on file   Other Topics Concern   . Not on file   Social History Narrative   . Not on file       The following sections were reviewed this encounter by the provider:   Tobacco  Allergies  Meds  Problems  Med Hx  Surg Hx  Fam Hx         Review of Systems   Constitutional: Negative for activity change, appetite change, fatigue, fever and unexpected weight change.   HENT: Negative for congestion, ear discharge, rhinorrhea, sinus pressure, sinus pain, sneezing and tinnitus.    Eyes: Positive for redness (to left eye chronic. has history of corneal transplant to left eye). Negative for pain, discharge and visual disturbance.   Respiratory: Negative for chest tightness, shortness of breath and wheezing.    Cardiovascular: Negative for chest pain and palpitations.   Gastrointestinal: Negative for abdominal distention, abdominal pain, diarrhea and rectal pain.   Endocrine: Negative for cold intolerance and heat intolerance.   Genitourinary: Negative for difficulty urinating, dysuria, flank pain and pelvic pain.   Musculoskeletal: Positive for neck pain. Negative for arthralgias, back pain, myalgias and neck stiffness.   Skin: Negative for color change, pallor and rash.   Neurological: Negative for dizziness, seizures, speech difficulty, weakness and headaches.   Psychiatric/Behavioral: Negative for behavioral problems and sleep disturbance. The patient is not nervous/anxious.        Objective:     Vitals:  BP 122/80 (BP Site: Right arm, Patient Position: Sitting, Cuff Size: Medium)   Pulse 68   Temp 97.4 F (36.3 C)   Ht 1.6 m (5\' 3" )   Wt 77.1 kg (170 lb)   SpO2 99%   BMI 30.11 kg/m     Physical Exam  Vitals signs reviewed.   Constitutional:       General: She is not in acute distress.     Appearance: Normal appearance. She is obese. She is not ill-appearing.   HENT:      Head: Normocephalic and atraumatic.      Right Ear: Tympanic membrane and ear canal normal.      Left Ear: Tympanic membrane and ear canal normal.      Nose: Nose normal.      Mouth/Throat:      Mouth: Mucous membranes are moist.      Pharynx: Oropharynx is clear.   Eyes:      Extraocular Movements: Extraocular  movements intact.      Pupils: Pupils are equal, round, and reactive to light.      Comments: Redness to left eye which is chronic after the corneal transplant   Neck:      Musculoskeletal: Normal range of motion and neck supple.   Cardiovascular:      Rate and Rhythm: Normal rate and regular rhythm.      Pulses: Normal pulses.      Heart sounds: Normal heart sounds.   Pulmonary:      Effort: Pulmonary effort is normal.      Breath sounds: Normal breath sounds.   Abdominal:      General: Abdomen is flat. Bowel sounds are normal.      Palpations: Abdomen is soft.   Musculoskeletal: Normal range of motion.         General: No swelling, deformity or signs of injury.      Right lower leg: No edema.      Left lower leg: No edema.   Skin:     General: Skin is warm.      Capillary Refill: Capillary refill takes less than 2 seconds.   Neurological:      General: No focal deficit present.      Mental Status: She is alert and oriented to person, place, and time.      Motor: No weakness.      Gait: Gait normal.   Psychiatric:         Mood and Affect: Mood normal.         Behavior: Behavior normal.        Assessment/Plan:       1. Physical exam  - CBC and differential  - Comprehensive metabolic panel  - TSH  - Lipid  panel  - Urinalysis  - Hemoglobin A1C  - T4, free    2. Vitamin D deficiency  - Vitamin D,25 OH, Total    3. Need for hepatitis C screening test  - Hepatitis C Antibody    4. Screening for breast cancer  - Mammo Screening w/Tomo Bilateral; Future    5. Neck pain  - XR Cervical Spine 2 Or 3 Views; Future  - Ambulatory referral to Physical Therapy    6. Family history of diabetes mellitus (DM)  - Hemoglobin A1C      Health Maintenance:  High BMI follow-up: Encouragement to Exercise. Recommend optimizing low carbohydrate diet efforts and obtaining at least 150 minutes of aerobic exercise per week. Recommend 20-25 grams of dietary fiber daily. Recommend drinking at least 60-80 ounces of water per day. Recommend optimizing low sodium diet measures ( less than 2 grams of sodium in the diet per day ). Recommend obtaining a Shingles vaccination. Recommend adding a daily probiotic. Vision screening UTD. Dental Screening UTD. Colonoscopy is UTD. Mammogram screening is due.  Screening mammogram ordered.    No follow-ups on file.    Elodia Florence, FNP

## 2018-04-04 NOTE — Progress Notes (Signed)
Have you seen any specialists/other providers since your last visit with [place]?    Yes    Arm preference verified?   Yes    The patient is due for shingles vaccine and Hep C screening, care plan

## 2018-04-05 ENCOUNTER — Other Ambulatory Visit (INDEPENDENT_AMBULATORY_CARE_PROVIDER_SITE_OTHER): Payer: Self-pay | Admitting: Family

## 2018-04-05 DIAGNOSIS — R7989 Other specified abnormal findings of blood chemistry: Secondary | ICD-10-CM

## 2018-04-05 MED ORDER — VITAMIN D (ERGOCALCIFEROL) 1.25 MG (50000 UT) PO CAPS
50000.00 [IU] | ORAL_CAPSULE | ORAL | 3 refills | Status: DC
Start: 2018-04-05 — End: 2019-04-28

## 2018-04-10 ENCOUNTER — Other Ambulatory Visit (INDEPENDENT_AMBULATORY_CARE_PROVIDER_SITE_OTHER): Payer: Self-pay | Admitting: Family

## 2018-04-10 ENCOUNTER — Encounter (INDEPENDENT_AMBULATORY_CARE_PROVIDER_SITE_OTHER): Payer: Self-pay | Admitting: Family

## 2018-04-10 DIAGNOSIS — E782 Mixed hyperlipidemia: Secondary | ICD-10-CM

## 2018-04-10 DIAGNOSIS — I10 Essential (primary) hypertension: Secondary | ICD-10-CM

## 2018-04-10 MED ORDER — LISINOPRIL-HYDROCHLOROTHIAZIDE 10-12.5 MG PO TABS
1.00 | ORAL_TABLET | Freq: Every day | ORAL | 1 refills | Status: DC
Start: 2018-04-10 — End: 2018-11-06

## 2018-04-10 MED ORDER — ROSUVASTATIN CALCIUM 40 MG PO TABS
ORAL_TABLET | ORAL | 1 refills | Status: DC
Start: 2018-04-10 — End: 2018-08-13

## 2018-06-04 ENCOUNTER — Other Ambulatory Visit: Payer: Self-pay | Admitting: Student

## 2018-06-04 ENCOUNTER — Ambulatory Visit: Payer: Medicare Other

## 2018-06-04 DIAGNOSIS — Z1231 Encounter for screening mammogram for malignant neoplasm of breast: Secondary | ICD-10-CM

## 2018-06-05 ENCOUNTER — Ambulatory Visit
Admission: RE | Admit: 2018-06-05 | Discharge: 2018-06-05 | Disposition: A | Discharge status: Home / Self Care | Payer: Medicare Other | Source: Ambulatory Visit | Attending: Student | Admitting: Student

## 2018-06-05 DIAGNOSIS — Z1231 Encounter for screening mammogram for malignant neoplasm of breast: Secondary | ICD-10-CM

## 2018-08-13 ENCOUNTER — Other Ambulatory Visit (INDEPENDENT_AMBULATORY_CARE_PROVIDER_SITE_OTHER): Payer: Self-pay | Admitting: Student in an Organized Health Care Education/Training Program

## 2018-08-13 ENCOUNTER — Encounter (INDEPENDENT_AMBULATORY_CARE_PROVIDER_SITE_OTHER): Payer: Self-pay | Admitting: Student in an Organized Health Care Education/Training Program

## 2018-08-13 DIAGNOSIS — E782 Mixed hyperlipidemia: Secondary | ICD-10-CM

## 2018-08-13 MED ORDER — ROSUVASTATIN CALCIUM 40 MG PO TABS
ORAL_TABLET | ORAL | 0 refills | Status: DC
Start: 2018-08-13 — End: 2018-12-20

## 2018-08-30 ENCOUNTER — Encounter (INDEPENDENT_AMBULATORY_CARE_PROVIDER_SITE_OTHER): Payer: Self-pay | Admitting: Student in an Organized Health Care Education/Training Program

## 2018-08-30 ENCOUNTER — Telehealth (INDEPENDENT_AMBULATORY_CARE_PROVIDER_SITE_OTHER)
Payer: No Typology Code available for payment source | Admitting: Student in an Organized Health Care Education/Training Program

## 2018-08-30 DIAGNOSIS — E782 Mixed hyperlipidemia: Secondary | ICD-10-CM

## 2018-08-30 DIAGNOSIS — I10 Essential (primary) hypertension: Secondary | ICD-10-CM

## 2018-08-30 DIAGNOSIS — I159 Secondary hypertension, unspecified: Secondary | ICD-10-CM

## 2018-08-30 DIAGNOSIS — G932 Benign intracranial hypertension: Secondary | ICD-10-CM

## 2018-08-30 NOTE — Progress Notes (Signed)
CC htn       Verbal consent has been obtained from the patient to conduct a video and telephone visit to minimize exposure to COVID-19: Yes     A two-way, synchronous, real- time, audio and visual interactive communication system between the patient and myself was utilized.      Time Spent:> 20 min.     Video used: zoom         Physical examination is limited to visual inspection with patient's input on video visit.         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   She is  compliant with Prinizide 10-12.5 mg qday   - The patient has NOT  been monitoring blood pressure.  - The patient has  been adhering to the recommended no-added-salt diet.  - The patient has not been compliant to the recommended exercise program.  - The patient is  tolerating the current medication regimen well.  - The patient denies orthostasis.  - The patient denies chest pain.  - The patient denies dyspnea.  - The patient denies edema.    Hx HLD  Compliant with cholesterol medication  No Myalgias,  Muscle aches , bodyaches  Compliant with low saturated and low carbohydrate Diet  No hx abnormal liver test related to statin therapy             There were no vitals filed for this visit.    Patient Active Problem List   Diagnosis    Fuchs' corneal dystrophy    Status post corneal transplant    Pseudophakia of left eye    Nuclear sclerosis of right eye    Dry eyes, bilateral    Blepharitis of both eyes    post DSEK, OS    Refractive error    Corneal graft rejection     ERM OS (epiretinal membrane, left eye)    PVD (posterior vitreous detachment)    OC (onychocryptosis)    Acquired keratoderma    Sciatica associated with disorder of lumbosacral spine    Acute pain of left knee       Past Medical History:   Diagnosis Date    Brain tumor (benign)     Hypercholesteremia     Hypertensive disorder        Past Surgical History:   Procedure Laterality Date    BRAIN SURGERY  2009    BREAST BIOPSY Right 2008    right breast biopsy- benign    BREAST BIOPSY Right 1970s    right breast tumor removed 40+ years ago - benign    BREAST CYST EXCISION  1970s    right breast tumor removed 40+ years ago - benign    CESAREAN SECTION      CORNEAL TRANSPLANT      EYE SURGERY      TUBAL LIGATION         Social History  Socioeconomic History    Marital status: Married     Spouse name: Not on file    Number of children: Not on file    Years of education: Not on file    Highest education level: Not on file   Occupational History    Not on file   Social Needs    Financial resource strain: Not on file    Food insecurity:     Worry: Not on file     Inability: Not on file    Transportation needs:     Medical: Not on file     Non-medical: Not on file   Tobacco Use    Smoking status: Never Smoker    Smokeless tobacco: Never Used   Substance and Sexual Activity    Alcohol use: Yes     Alcohol/week: 1.0 standard drinks     Types: 1 Glasses of wine per week     Frequency: Monthly or less     Binge frequency: Never    Drug use: No    Sexual activity: Not Currently     Partners: Male     Birth control/protection: None   Lifestyle    Physical activity:     Days per week: Not on file     Minutes per session: Not on file    Stress: Not on file   Relationships    Social connections:     Talks on phone: Not on file     Gets together: Not on file     Attends religious service: Not on file     Active member of club or organization: Not on file     Attends meetings of clubs or  organizations: Not on file     Relationship status: Not on file    Intimate partner violence:     Fear of current or ex partner: Not on file     Emotionally abused: Not on file     Physically abused: Not on file     Forced sexual activity: Not on file   Other Topics Concern    Not on file   Social History Narrative    Not on file       Family History   Problem Relation Age of Onset    Diabetes Mother     Hypertension Mother     Diabetes Father     Hypertension Father     Breast cancer Neg Hx        Allergies   Allergen Reactions    Iodine Shortness Of Breath    Penicillins Shortness Of Breath     Has patient had a PCN reaction causing immediate rash, facial/tongue/throat swelling, SOB or lightheadedness with hypotension: Yes  Has patient had a PCN reaction causing severe rash involving mucus membranes or skin necrosis: No  Has patient had a PCN reaction that required hospitalization: No  Has patient had a PCN reaction occurring within the last 10 years: Yes  If all of the above answers are "NO", then may proceed with Cephalosporin use.    Rosuvastatin Shortness Of Breath     Throat swelling after Lisinopril was added, previous P[CP Stopped Crestor and Pt has been doing fine   Throat swelling after Lisinopril was added, previous P[CP Stopped Crestor and Pt has been doing fine   Throat swelling after Lisinopril was added, previous P[CP Stopped Crestor and Pt has been doing fine     Shellfish-Derived Products Anaphylaxis    Atorvastatin  Joint pain     Tetanus Immune Globulin      Arm swelling     Zocor [Simvastatin]      No response        Current Outpatient Medications   Medication Sig Dispense Refill    acetaZOLAMIDE (DIAMOX) 125 MG tablet Take 250 mg by mouth as needed      co-enzyme Q-10 50 MG capsule Take 50 mg by mouth daily.      cycloSPORINE (RESTASIS) 0.05 % ophthalmic emulsion Apply 1 drop to eye daily      difluprednate (DUREZOL) 0.05 % Emulsion ophthalmic solution Place 1 drop into  the left eye daily.       fluticasone (FLONASE) 50 MCG/ACT nasal spray       lisinopril-hydroCHLOROthiazide (PRINZIDE,ZESTORETIC) 10-12.5 MG per tablet Take 1 tablet by mouth daily 90 tablet 1    Multiple Vitamin (MULTIVITAMIN) tablet Take 1 tablet by mouth daily.      nortriptyline (PAMELOR) 10 MG capsule as needed         rosuvastatin (CRESTOR) 40 MG tablet 1 tab qod 90 tablet 0    triamcinolone (KENALOG) 0.025 % ointment Apply topically 2 (two) times daily (Patient taking differently: Apply topically as needed   ) 80 g 1    vitamin D, ergocalciferol, (DRISDOL) 50000 UNIT Cap Take 1 capsule (50,000 Units total) by mouth once a week 12 capsule 3     No current facility-administered medications for this visit.        Constitutional: negative for fatigue, fever, chills   Eyes: negative for vision problems   Ears, nose, mouth, throat, and face: negative for nose bleeds, nasal congestion   Respiratory: negative for SOB, cough , wheezing   Cardiovascular: negative for CP, dizziness, palpitations, swelling , orthopnea   Gastrointestinal: negative for diarrhea , nausea, vomiting, constipation   Genitourinary:negative for dysuria, frequency and hematuria   Integument/breast: negative for nodule   Hematologic/lymphatic: negative for enlarged LAD,   Musculoskeletal:negative for myalgia, joint pain and swelling   Neurological: negative for HA, dizziness, tingling ,   Behavioral/Psych: negative for anxiety, depression    Other ROS were reviewed and were neg         BP : 140/88s    PSYCHIATRIC Oriented X 3, Normal affect. Normal insight.      A/P    1. Mixed hyperlipidemia     2. Essential hypertension     3. Secondary hypertension     4. Intracranial pressure increased         D/w Pt that she needs to d/w her neurologist a better control of her HA, she may need to take Pamelor 10 on regular basis rather than prn basis, she seems reluctant as her neurologist told her to take it as needed . Pending brain MRi    pT was  offered to inc her Lisinopril-HC  from 10-12.5 to 20-12.5, Pt will watch her BP and keep a BP log for at least 2-3 weeks, she will f/u then and make a decision, DASH, weight loss, and inc work out in the meantime, better HA control with her neurologist     Patient counseled to watch concentrated sweets and carbohydrates. Patient also counseled to exercise at least three times a week. Patient counseled about routine foot care  Patient counseled to eat a low fat, low cholesterol diet and to exercise at least three times a week for at least 30 minutes.  Patient counseled to eat a low salt  diet(DASH diet)      > 20 min spent withPt , > 50 %  were spent in counseling and care coordination, and discussing differential

## 2018-09-01 NOTE — Patient Instructions (Signed)
bp target :< 130/80     Low-Salt Choices  Eating salt(sodium)can make your body retain too much water. Extra water makes your heart work harder. Canned, packaged, and frozen foods are easy to prepare. But they are often high in sodium. Here are some ideas for low-salt foods you can easily make yourself.     For breakfast   Fruit or 100% fruit juice. It's better to have whole fruit instead of 100% fruit juice.   Whole-wheat bread or an English muffin. Look for sodium content on Nutrition Facts labels.   Low-fat milk or yogurt   Unsalted eggs   Shredded wheat   Corn tortillas   Unsalted steamed rice   Regular (not instant) hot cereal, made without salt  Stay away from   Sausage, bacon, and ham   Flour tortillas   Packaged muffins, pancakes, and biscuits   Instant hot cereals   Cottage cheese    For lunch and dinner   Fresh fish, chicken, [place], or meatbaked, broiled, or roasted without salt   Dry beans, cooked without salt   Tofu, stir-fried without salt   Unsalted fresh fruit and vegetables, or frozen or canned fruit and vegetables with no added salt  Stay away from   Lunch or deli meat that is cured or smoked   Cheese   Tomato juice and ketchup   Canned vegetables, soups, and fish not labeled as no-salt-added or reduced sodium   Packaged gravies and sauces   Olives, pickles, and relish   Bottled salad dressings    For snacks and desserts   Yogurt   Unsalted, air-popped popcorn   Unsalted nuts or seeds  Stay away from   Pies and cakes   Packaged dessert mixes   Pizza   Canned and packaged puddings   Pretzels, chips, crackers, and nutsunless the label says unsalted    [employer] last reviewed this educational content on 02/22/2018   2000-2020 [employer] [address], [place], [place] [address]. All rights reserved. This information is not intended as a substitute for professional medical care. Always follow your healthcare professional's instructions.

## 2018-09-05 ENCOUNTER — Encounter (INDEPENDENT_AMBULATORY_CARE_PROVIDER_SITE_OTHER): Payer: Self-pay | Admitting: Student in an Organized Health Care Education/Training Program

## 2018-10-16 ENCOUNTER — Other Ambulatory Visit: Payer: Self-pay | Admitting: *Deleted

## 2018-10-16 DIAGNOSIS — Z20822 Contact with and (suspected) exposure to covid-19: Secondary | ICD-10-CM

## 2018-10-19 LAB — NOVEL CORONAVIRUS, NAA: SARS-CoV-2, NAA: NOT DETECTED

## 2018-11-06 ENCOUNTER — Ambulatory Visit (INDEPENDENT_AMBULATORY_CARE_PROVIDER_SITE_OTHER): Payer: No Typology Code available for payment source | Admitting: Foot & Ankle Surgery

## 2018-11-06 ENCOUNTER — Encounter (INDEPENDENT_AMBULATORY_CARE_PROVIDER_SITE_OTHER): Payer: Self-pay | Admitting: Foot & Ankle Surgery

## 2018-11-06 ENCOUNTER — Ambulatory Visit (INDEPENDENT_AMBULATORY_CARE_PROVIDER_SITE_OTHER): Payer: No Typology Code available for payment source | Admitting: Internal Medicine

## 2018-11-06 ENCOUNTER — Encounter (INDEPENDENT_AMBULATORY_CARE_PROVIDER_SITE_OTHER): Payer: Self-pay | Admitting: Internal Medicine

## 2018-11-06 VITALS — BP 125/76 | HR 76 | Temp 98.4°F | Wt 169.0 lb

## 2018-11-06 VITALS — BP 125/78 | HR 73 | Temp 97.7°F | Resp 17 | Ht 63.0 in | Wt 169.0 lb

## 2018-11-06 DIAGNOSIS — L851 Acquired keratosis [keratoderma] palmaris et plantaris: Secondary | ICD-10-CM

## 2018-11-06 DIAGNOSIS — L600 Ingrowing nail: Secondary | ICD-10-CM

## 2018-11-06 DIAGNOSIS — B351 Tinea unguium: Secondary | ICD-10-CM

## 2018-11-06 DIAGNOSIS — L739 Follicular disorder, unspecified: Secondary | ICD-10-CM

## 2018-11-06 DIAGNOSIS — E782 Mixed hyperlipidemia: Secondary | ICD-10-CM

## 2018-11-06 DIAGNOSIS — G932 Benign intracranial hypertension: Secondary | ICD-10-CM

## 2018-11-06 DIAGNOSIS — Z124 Encounter for screening for malignant neoplasm of cervix: Secondary | ICD-10-CM

## 2018-11-06 DIAGNOSIS — Z79899 Other long term (current) drug therapy: Secondary | ICD-10-CM

## 2018-11-06 DIAGNOSIS — I10 Essential (primary) hypertension: Secondary | ICD-10-CM

## 2018-11-06 LAB — HEPATIC FUNCTION PANEL
ALT: 20 U/L (ref 0–55)
AST (SGOT): 24 U/L (ref 5–34)
Albumin/Globulin Ratio: 1.3 (ref 0.9–2.2)
Albumin: 4.2 g/dL (ref 3.5–5.0)
Alkaline Phosphatase: 79 U/L (ref 37–106)
Bilirubin Direct: 0.2 mg/dL (ref 0.0–0.5)
Bilirubin Indirect: 0.2 mg/dL (ref 0.2–1.0)
Bilirubin, Total: 0.4 mg/dL (ref 0.2–1.2)
Globulin: 3.2 g/dL (ref 2.0–3.7)
Protein, Total: 7.4 g/dL (ref 6.0–8.3)

## 2018-11-06 LAB — LIPID PANEL
Cholesterol / HDL Ratio: 3.8
Cholesterol: 174 mg/dL (ref 0–199)
HDL: 46 mg/dL (ref 40–9999)
LDL Calculated: 110 mg/dL — ABNORMAL HIGH (ref 0–99)
Triglycerides: 88 mg/dL (ref 34–149)
VLDL Calculated: 18 mg/dL (ref 10–40)

## 2018-11-06 LAB — BASIC METABOLIC PANEL
Anion Gap: 10 (ref 5.0–15.0)
BUN: 18 mg/dL (ref 7.0–19.0)
CO2: 27 mEq/L (ref 21–29)
Calcium: 9.5 mg/dL (ref 8.5–10.5)
Chloride: 104 mEq/L (ref 100–111)
Creatinine: 0.8 mg/dL (ref 0.4–1.5)
Glucose: 101 mg/dL — ABNORMAL HIGH (ref 70–100)
Potassium: 3.9 mEq/L (ref 3.5–5.1)
Sodium: 141 mEq/L (ref 136–145)

## 2018-11-06 LAB — GFR: EGFR: >60

## 2018-11-06 LAB — HEMOLYSIS INDEX: Hemolysis Index: 5 (ref 0–18)

## 2018-11-06 MED ORDER — DOXYCYCLINE HYCLATE 100 MG PO TABS
100.00 mg | ORAL_TABLET | Freq: Every day | ORAL | 0 refills | Status: AC
Start: 2018-11-06 — End: 2018-11-16

## 2018-11-06 MED ORDER — ACETAZOLAMIDE 125 MG PO TABS
250.00 mg | ORAL_TABLET | ORAL | 1 refills | Status: DC | PRN
Start: 2018-11-06 — End: 2020-01-02

## 2018-11-06 MED ORDER — LISINOPRIL-HYDROCHLOROTHIAZIDE 10-12.5 MG PO TABS
1.00 | ORAL_TABLET | Freq: Every day | ORAL | 1 refills | Status: DC
Start: 2018-11-06 — End: 2019-04-21

## 2018-11-06 MED ORDER — MUPIROCIN 2 % EX OINT
TOPICAL_OINTMENT | Freq: Two times a day (BID) | CUTANEOUS | 0 refills | Status: AC
Start: 2018-11-06 — End: 2018-11-20

## 2018-11-06 NOTE — Progress Notes (Signed)
PODIATRIC SURGERY   PROGRESS NOTE      Patient Name: Wendy [name],Wendy [name]    Assessment:   -Patient is 62 y.o. female with onychocryptosis and hyperkeratosis of multiple toes without evidence of infection  Onychomycosis multiple toenails    Plan:   -As a courtesy, 15 blade was used to debride hyperkeratotic callus, most notably to the left third toe.  Patient had immediate pain relief.  No evidence of paronychia or infection.  No open wounds.  -Recommend patient soak in warm water and Epsom salt twice a day.  Can apply a small amount of antibiotic ointment to the cuticle if it looks red or inflamed.  -Discussed possible ingrown toenail procedure in the future on her lesser toe if needed, or if pain fails to resolve.  -Discussed using topical antifungal medicine on her toenails if she wishes.  Discussed she would need to use for at least 1 year to see improvement.  -F/U as needed      Subjective:   Patient presents to clinic with chief complaint of multiple painful ingrown toenails.  Most notably she is having pain in the left third toe.  Occasionally has pain on the fifth toes.  Denies redness swelling or drainage.  She is not cut her nails in quite some time, recently cut them which seem to make the pain worse.  Her other concern is that some of her nails are thick and discolored.  She usually keeps them covered with nail polish.  Occasionally gets pedicures.  Has not had one in quite some time.  Seems to feel better in sandals and more pain in close toed shoes.      Denies N/V/F/C/SOB/CP  All other systems were reviewed and are negative    Past Medical History:   Past Medical History:   Diagnosis Date    Brain tumor (benign)     Hypercholesteremia     Hypertensive disorder        The following portions of the patient's history were reviewed and updated as appropriate: allergies, current medications, past medical history, past surgical history, family history, and problem list.    Physical Exam:     Vitals:    11/06/18  1422   BP: 125/76   Pulse: 76   Temp: 98.4 F (36.9 C)   SpO2: 99%       Gen: AAOx3, NAD, Well developed  HEENT: Normocephalic, EOMI  Heart: Regular pulse rate and rhythm  Vascular:  Palpable pedal pulses b/l, CFT to all toes <3 sec. No edema. No varicosities  Derm: There is hyperkeratotic callus to the medial lateral borders of multiple lesser toes most notably the left third toe lateral nail border.  No paronychia.  No active drainage or purulence.  Skin intact without wounds or rashes.  No erythema or ecchymosis.  MSK: Fifth toes sit in a varus position when loaded.  Lateral nail border is on weightbearing surface with standing.  MMS 5/5 and symmetric, No other areas of pain or tenderness. No gross deformity. ROM normal without crepitus.  Neuro:  SILT. Neg tinel's sign.  Able to wiggle toes. No motor deficits.         Standing and gait exam: Gait is nonantalgic.  Normal gait.    Imaging studies:   None performed today.      Signed by: Laqueta Carina, DPM          The review of the patient's medications does not in any way constitute an endorsement,  by this clinician,  of their use, dosage, indications, route, efficacy, interactions, or other clinical parameters.     This note was generated within the [employer] EMR using Dragon medical speech recognition software and may contain inherent errors or omissions not intended by the user. Grammatical and punctuation errors, random word insertions, deletions, pronoun errors and incomplete sentences are occasional consequences of this technology due to software limitations. Not all errors are caught or corrected.  Although every attempt is made to root out erroneus and incomplete transcription, the note may still not fully represent the intent or opinion of the author. If there are questions or concerns about the content of this note or information contained within the body of this dictation they should be addressed directly with the author for clarification.

## 2018-11-06 NOTE — Progress Notes (Signed)
Chief Complaint   Patient presents with    Gynecologic Exam     with labs, pt. is fasting    Hypertension    Hyperlipidemia    skin lesion left thigh     She is here for routine pap and gynecology exam, No vaginal discharge, pelvic pain    Hx HTN  .  The patient is compliant with anti-hypertensive therapy and denies side effects to therapy.  Pt denies CP, SOB, dizziness, orthopnea, PND or edema.  shechecks Blood pressure at home and is normal   Is tolerating meds without side effects.   Patient  is compliant with Medications    Hx HLD  Compliant with cholesterol medication  No Myalgias,  Muscle aches , bodyaches  Compliant with low saturated and low carbohydrate Diet  No hx abnormal liver test related to statin therapy   Prefers to lower the dose of crestor based on the lipid test    Reports recurrent boil like lesions Left thigh on the back, get dry an ditchy as well, nothing on th eRight  Side  O/E appear like dry folliculitis.    Hx benign Breat lesion resection on Diamox prn for headache     Past Medical History:   Diagnosis Date    Brain tumor (benign)     Hypercholesteremia     Hypertensive disorder       Past Surgical History:   Procedure Laterality Date    BRAIN SURGERY  2009    BREAST BIOPSY Right 2008    right breast biopsy- benign    BREAST BIOPSY Right 1970s    right breast tumor removed 40+ years ago - benign    BREAST CYST EXCISION  1970s    right breast tumor removed 40+ years ago - benign    CESAREAN SECTION      CORNEAL TRANSPLANT      EYE SURGERY      TUBAL LIGATION        (Not in a hospital admission)    Current/Home Medications    CO-ENZYME Q-10 50 MG CAPSULE    Take 50 mg by mouth daily.    CYCLOSPORINE (RESTASIS) 0.05 % OPHTHALMIC EMULSION    Apply 1 drop to eye daily    DIFLUPREDNATE (DUREZOL) 0.05 % EMULSION OPHTHALMIC SOLUTION    Place 1 drop into the left eye daily.     FLUTICASONE (FLONASE) 50 MCG/ACT NASAL SPRAY        MULTIPLE VITAMIN (MULTIVITAMIN) TABLET    Take 1 tablet  by mouth daily.    NORTRIPTYLINE (PAMELOR) 10 MG CAPSULE    as needed       NORTRIPTYLINE (PAMELOR) 10 MG CAPSULE    Take 10 mg by mouth    ROSUVASTATIN (CRESTOR) 40 MG TABLET    1 tab qod    TRIAMCINOLONE (KENALOG) 0.025 % OINTMENT    Apply topically 2 (two) times daily    VITAMIN D, ERGOCALCIFEROL, (DRISDOL) 50000 UNIT CAP    Take 1 capsule (50,000 Units total) by mouth once a week     Allergies   Allergen Reactions    Iodine Shortness Of Breath    Penicillins Shortness Of Breath     Has patient had a PCN reaction causing immediate rash, facial/tongue/throat swelling, SOB or lightheadedness with hypotension: Yes  Has patient had a PCN reaction causing severe rash involving mucus membranes or skin necrosis: No  Has patient had a PCN reaction that required hospitalization: No  Has patient had  a PCN reaction occurring within the last 10 years: Yes  If all of the above answers are "NO", then may proceed with Cephalosporin use.    Rosuvastatin Shortness Of Breath     Throat swelling after Lisinopril was added, previous P[CP Stopped Crestor and Pt has been doing fine   Throat swelling after Lisinopril was added, previous P[CP Stopped Crestor and Pt has been doing fine   Throat swelling after Lisinopril was added, previous P[CP Stopped Crestor and Pt has been doing fine     Shellfish-Derived Products Anaphylaxis    Atorvastatin      Joint pain     Tetanus Immune Globulin      Arm swelling     Zocor [Simvastatin]      No response       Social History     Tobacco Use    Smoking status: Never Smoker    Smokeless tobacco: Never Used   Substance Use Topics    Alcohol use: Yes     Alcohol/week: 1.0 - 2.0 standard drinks     Types: 1 - 2 Shots of liquor per week     Frequency: Monthly or less     Binge frequency: Monthly      Family History   Problem Relation Age of Onset    Diabetes Mother     Hypertension Mother     Diabetes Father     Hypertension Father     Breast cancer Neg Hx         Review of Systems -  History obtained from the patient  General ROS: negative for - chills, fatigue or malaise  Psychological ROS: negative for - anxiety or depression  Ophthalmic ROS: negative for - blurry vision or double vision  Respiratory ROS: no cough, shortness of breath, or wheezing  Cardiovascular ROS: no chest pain or dyspnea on exertion  negative for - edema or palpitations  Gastrointestinal ROS: no abdominal pain, change in bowel habits, or black or bloody stools  Genito-Urinary ROS: no dysuria, trouble voiding, or hematuria  negative for - vulvar/vaginal symptoms  Neurological ROS: positive for - headaches  negative for - numbness/tingling, speech problems, visual changes or weakness  Dermatological ROS: positive for skin lesion changes    Vitals:    11/06/18 0955   BP: 125/78   Pulse: 73   Resp: 17   Temp: 97.7 F (36.5 C)   SpO2: 100%     Physical Examination: General appearance - alert, well appearing, and in no distress and oriented to person, place, and time  Mental status - alert, oriented to person, place, and time  Eyes - pupils equal and reactive, extraocular eye movements intact  Neck - supple, no significant adenopathy, carotids upstroke normal bilaterally, no bruits and thyroid exam: thyroid is normal in size without nodules or tenderness  Chest - clear to auscultation, no wheezes, rales or rhonchi, symmetric air entry  Heart - normal rate, regular rhythm, normal S1, S2, no murmurs, rubs, clicks or gallops  Abdomen - soft, nontender, nondistended, no masses or organomegaly  Neurological - alert, oriented, normal speech, no focal findings or movement disorder noted  Extremities - peripheral pulses normal, no pedal edema, no clubbing or cyanosis  Skin - dry folliculitis noted left posterior thigh   Rt normal  Physical Examination: Pelvic - normal external genitalia, vulva, vagina, cervix, uterus and adnexa, PAP: Pap smear done today, thin-prep method        Assessment/Plan:  1. Mixed hyperlipidemia  - Lipid  panel    2. Essential hypertension  - Basic Metabolic Panel  - lisinopril-hydroCHLOROthiazide (ZESTORETIC) 10-12.5 MG per tablet; Take 1 tablet by mouth daily  Dispense: 90 tablet; Refill: 1    3. On statin therapy    - Hepatic function panel (LFT)    4. Pap smear for cervical cancer screening    - Pap Smear, Thin Prep Method w/ HR HPV    5. Folliculitis    - doxycycline (VIBRA-TABS) 100 MG tablet; Take 1 tablet (100 mg total) by mouth daily for 10 days  Dispense: 20 tablet; Refill: 0  - mupirocin (Bactroban) 2 % ointment; Apply topically 2 (two) times daily for 14 days  Dispense: 30 g; Refill: 0    6. Intracranial pressure increased  - acetaZOLAMIDE (DIAMOX) 125 MG tablet; Take 2 tablets (250 mg total) by mouth as needed (Intracranial pressure)  Dispense: 90 tablet; Refill: 1    Body mass index is 29.94 kg/m.           Encouragement to Exercise    Try to limit the amount of saturated fat and trans fat in your diet.  These are in foods like red meat, vegetable oil, butter, dairy products that are not low-fat (such as most cheese, 2% or whole milk, creamer, etc), fried foods, and rich sweets like cakes/donuts/etc.  You should aim to keep saturated fats under 15-20g per day.  In addition, read ingredient labels and avoid any hydrogenated or partially-hydrogenated oils.      There are some fats that are good for you - these are unsaturated fats (especially monounsaturated).  These are found in olive oil, canola oil, nuts, seeds, and avocados.  Try to replace saturated and trans fats with these kinds of foods.    Try to limit the amount of sodium in your diet to less than 2000mg  per day.  Most people do not get the majority of their sodium from table salt.  The sodium comes from processed foods.  Anything canned, frozen, pre-packaged, etc usually has a large amount of sodium, so read the labels.  Deli meat is another common source.      Eating out is the most common source of dietary sodium.  Almost all restaurant  meals have extremely high sodium (1000-3000mg  plus!), even in most cases the salads.  Minimize eating out, and ask if they have any low sodium options.  But generally, fresh cooking at home is the healthiest.      Try to increase your physical activity, especially cardio.  Even starting small with walking 10 minutes a day helps.  Gradually build up to a goal of 30 minutes of moderate activity (brisk walking) 5 days per week.  You should also add in some core strength training (abdomen and back) and light stretching to help prevent injury and chronic back pain.      Risk & Benefits of the new medication(s) were explained to the patient who verbalized understanding & agreed to the treatment plan        Dr Windell Moment MD  Internist  [facility]  [address], [place],  [place] [id number]  [phone]  [phone]

## 2018-11-06 NOTE — Patient Instructions (Signed)
Doxycycline tablets or capsules  Brand Names: Acticlate, Adoxa, Adoxa Pak, Alodox, Avidoxy, Doxal, LYMEPAK, Mondoxyne NL, Monodox, Morgidox 1x, Morgidox 1x Kit, Morgidox 2x, Morgidox 2x Kit, NutriDox, Ocudox, TARGADOX, Vibramycin, Vibra-Tabs  What is this medicine?  DOXYCYCLINE (dox i SYE kleen) is a tetracycline antibiotic. It kills certain bacteria or stops their growth. It is used to treat many kinds of infections, like dental, skin, respiratory, and urinary tract infections. It also treats acne, Lyme disease, malaria, and certain sexually transmitted infections.  How should I use this medicine?  Take this medicine by mouth with a full glass of water. Follow the directions on the prescription label. It is best to take this medicine without food, but if it upsets your stomach take it with food. Take your medicine at regular intervals. Do not take your medicine more often than directed. Take all of your medicine as directed even if you think you are better. Do not skip doses or stop your medicine early.  Talk to your pediatrician regarding the use of this medicine in children. While this drug may be prescribed for selected conditions, precautions do apply.  What side effects may I notice from receiving this medicine?  Side effects that you should report to your doctor or health care professional as soon as possible:  · allergic reactions like skin rash, itching or hives, swelling of the face, lips, or tongue  · difficulty breathing  · fever  · itching in the rectal or genital area  · pain on swallowing  · redness, blistering, peeling or loosening of the skin, including inside the mouth  · severe stomach pain or cramps  · unusual bleeding or bruising  · unusually weak or tired  · yellowing of the eyes or skin  Side effects that usually do not require medical attention (report to your doctor or health care professional if they continue or are bothersome):  · diarrhea  · loss of appetite  · nausea, vomiting  What  may interact with this medicine?  · antacids  · barbiturates  · birth control pills  · bismuth subsalicylate  · carbamazepine  · methoxyflurane  · other antibiotics  · phenytoin  · vitamins that contain iron  · warfarin    What if I miss a dose?  If you miss a dose, take it as soon as you can. If it is almost time for your next dose, take only that dose. Do not take double or extra doses.  Where should I keep my medicine?  Keep out of the reach of children.  Store at room temperature, below 30 degrees C (86 degrees F). Protect from light. Keep container tightly closed. Throw away any unused medicine after the expiration date. Taking this medicine after the expiration date can make you seriously ill.  What should I tell my health care provider before I take this medicine?  They need to know if you have any of these conditions:  · liver disease  · long exposure to sunlight like working outdoors  · stomach problems like colitis  · an unusual or allergic reaction to doxycycline, tetracycline antibiotics, other medicines, foods, dyes, or preservatives  · pregnant or trying to get pregnant  · breast-feeding  What should I watch for while using this medicine?  Tell your doctor or health care professional if your symptoms do not improve.  Do not treat diarrhea with over the counter products. Contact your doctor if you have diarrhea that lasts more than 2 days or   if it is severe and watery.  Do not take this medicine just before going to bed. It may not dissolve properly when you lay down and can cause pain in your throat. Drink plenty of fluids while taking this medicine to also help reduce irritation in your throat.  This medicine can make you more sensitive to the sun. Keep out of the sun. If you cannot avoid being in the sun, wear protective clothing and use sunscreen. Do not use sun lamps or tanning beds/booths.  Birth control pills may not work properly while you are taking this medicine. Talk to your doctor about  using an extra method of birth control.  If you are being treated for a sexually transmitted infection, avoid sexual contact until you have finished your treatment. Your sexual partner may also need treatment.  Avoid antacids, aluminum, calcium, magnesium, and iron products for 4 hours before and 2 hours after taking a dose of this medicine.  If you are using this medicine to prevent malaria, you should still protect yourself from contact with mosquitos. Stay in screened-in areas, use mosquito nets, keep your body covered, and use an insect repellent.  NOTE:This sheet is a summary. It may not cover all possible information. If you have questions about this medicine, talk to your doctor, pharmacist, or health care provider. Copyright© 2020 [employer]

## 2018-11-12 LAB — PAP SMEAR, THIN PREP WITH HR HPV: HPV DNA, high risk: NOT DETECTED

## 2018-12-17 ENCOUNTER — Encounter (INDEPENDENT_AMBULATORY_CARE_PROVIDER_SITE_OTHER): Payer: Self-pay | Admitting: Internal Medicine

## 2018-12-18 ENCOUNTER — Other Ambulatory Visit (INDEPENDENT_AMBULATORY_CARE_PROVIDER_SITE_OTHER): Payer: Self-pay | Admitting: Internal Medicine

## 2018-12-18 DIAGNOSIS — T3695XA Adverse effect of unspecified systemic antibiotic, initial encounter: Secondary | ICD-10-CM

## 2018-12-18 MED ORDER — FLUCONAZOLE 150 MG PO TABS
150.00 mg | ORAL_TABLET | Freq: Once | ORAL | 0 refills | Status: AC
Start: 2018-12-18 — End: 2018-12-18

## 2018-12-19 ENCOUNTER — Encounter (INDEPENDENT_AMBULATORY_CARE_PROVIDER_SITE_OTHER): Payer: Self-pay | Admitting: Internal Medicine

## 2018-12-20 ENCOUNTER — Other Ambulatory Visit (INDEPENDENT_AMBULATORY_CARE_PROVIDER_SITE_OTHER): Payer: Self-pay | Admitting: Internal Medicine

## 2018-12-20 DIAGNOSIS — E782 Mixed hyperlipidemia: Secondary | ICD-10-CM

## 2018-12-20 MED ORDER — ROSUVASTATIN CALCIUM 40 MG PO TABS
ORAL_TABLET | ORAL | 1 refills | Status: DC
Start: 2018-12-20 — End: 2019-04-28

## 2019-02-13 ENCOUNTER — Telehealth (INDEPENDENT_AMBULATORY_CARE_PROVIDER_SITE_OTHER): Payer: Self-pay

## 2019-02-13 NOTE — Telephone Encounter (Signed)
PA for VitD started on covermymeds today.

## 2019-02-14 ENCOUNTER — Encounter (INDEPENDENT_AMBULATORY_CARE_PROVIDER_SITE_OTHER): Payer: Self-pay | Admitting: Internal Medicine

## 2019-04-03 ENCOUNTER — Encounter (INDEPENDENT_AMBULATORY_CARE_PROVIDER_SITE_OTHER): Payer: Self-pay | Admitting: Student in an Organized Health Care Education/Training Program

## 2019-04-03 ENCOUNTER — Telehealth (INDEPENDENT_AMBULATORY_CARE_PROVIDER_SITE_OTHER): Payer: Self-pay | Admitting: Internal Medicine

## 2019-04-03 NOTE — Telephone Encounter (Signed)
Patient needs to be seen for annual physical and bw and is requesting to be squeezed in for a sooner appointment than available for TOMORROW 12/11. Please return call at noted phone number below.      Patient declined to schedule next available appointment        Patient Preferred Callback Number: [phone]

## 2019-04-08 ENCOUNTER — Ambulatory Visit
Admission: EM | Admit: 2019-04-08 | Discharge: 2019-04-08 | Disposition: A | Discharge status: Home / Self Care | Payer: Medicare Other | Attending: Emergency Medicine | Admitting: Emergency Medicine

## 2019-04-08 ENCOUNTER — Other Ambulatory Visit: Payer: Self-pay

## 2019-04-08 ENCOUNTER — Encounter: Payer: Self-pay | Admitting: Emergency Medicine

## 2019-04-08 DIAGNOSIS — N952 Postmenopausal atrophic vaginitis: Secondary | ICD-10-CM

## 2019-04-08 MED ORDER — FLUCONAZOLE 200 MG PO TABS: 200.0000 mg | ORAL_TABLET | Freq: Once | ORAL | 0 refills | Status: AC

## 2019-04-08 MED ORDER — ESTRADIOL 0.1 MG/GM VA CREA: 1.0000 | TOPICAL_CREAM | Freq: Every day | VAGINAL | 0 refills | Status: AC

## 2019-04-08 NOTE — ED Provider Notes (Signed)
[facility]    CSN: [id number] Arrival date & time: 04/08/19  7893      History   Chief Complaint Chief Complaint  Patient presents with  . Vaginal Itching    HPI Annette Bentley is a 63 y.o. female with history hypertension, hyperlipidemia presenting for 1 week course of vaginal itching, irritation.  Patient does have mild discharge, not consistent with previous yeast infections.  Last yeast infection was last year.  Patient denies history of recurrent BV, UTI.  No urinary symptoms at this time.  No pelvic or abdominal pain.  No history of kidney stones, pyelonephritis.  Patient tried Monistat at home, though did not get relief with this.    Past Medical History:  Diagnosis Date  . High cholesterol   . Hypertension     There are no problems to display for this patient.   Past Surgical History:  Procedure Laterality Date  . BRAIN SURGERY    . BREAST EXCISIONAL BIOPSY Right    benign  . EYE SURGERY      OB History   No obstetric history on file.      Home Medications    Prior to Admission medications   Medication Sig Start Date End Date Taking? Authorizing Provider  acetaZOLAMIDE (DIAMOX) 125 MG tablet Take 125 mg by mouth at bedtime as needed.     [provider]  co-enzyme Q-10 30 MG capsule Take 30 mg by mouth every other day.    [provider]  Difluprednate (DUREZOL) 0.05 % EMUL Place 1 drop into the left eye daily.     [provider]  estradiol (ESTRACE) 0.1 MG/GM vaginal cream Place 1 Applicatorful vaginally at bedtime. Use 3 times weekly x 2 weeks, then taper as needed 04/08/19   Hall-Potvin, [place], PA-C  fluconazole (DIFLUCAN) 200 MG tablet Take 1 tablet (200 mg total) by mouth once for 1 dose. May repeat in 72 hours if needed 04/08/19 04/08/19  Hall-Potvin, [place], PA-C  ipratropium (ATROVENT) 0.06 % nasal spray Place 2 sprays into both nostrils 4 (four) times daily. Patient not taking: Reported on 02/11/2018  07/16/17   Belinda Fisher, PA-C  Iron-Vitamins (GERITOL COMPLETE PO) Take 1 tablet by mouth every other day.    [provider]  lisinopril-hydrochlorothiazide (PRINZIDE,ZESTORETIC) 10-12.5 MG tablet Take 1 tablet by mouth daily.    [provider]  nortriptyline (PAMELOR) 10 MG capsule Take 10 mg by mouth at bedtime as needed.     [provider]  rosuvastatin (CRESTOR) 40 MG tablet Take 40 mg by mouth at bedtime. 10/22/17   [provider]  Vitamin D, Ergocalciferol, (DRISDOL) 50000 units CAPS capsule Take 50,000 Units by mouth every 7 (seven) days.    [provider]    Family History Family History  Problem Relation Age of Onset  . Hypertension Mother   . Diabetes Mother   . Diabetes Father     Social History Social History   Tobacco Use  . Smoking status: Never Smoker  . Smokeless tobacco: Never Used  Substance Use Topics  . Alcohol use: No  . Drug use: Not on file     Allergies   Atorvastatin, Iodine, Penicillins, Rosuvastatin, Simvastatin, Shellfish allergy, and Tetanus toxoids   Review of Systems Review of Systems  Constitutional: Negative for fatigue and fever.  Respiratory: Negative for cough and shortness of breath.   Cardiovascular: Negative for chest pain and palpitations.  Gastrointestinal: Negative for constipation and diarrhea.  Genitourinary: Positive for vaginal discharge and vaginal pain. Negative for difficulty urinating, dysuria, flank pain, frequency, hematuria, pelvic pain, urgency and vaginal bleeding.     Physical Exam Triage Vital Signs ED Triage Vitals  Enc Vitals Group     BP      Pulse      Resp      Temp      Temp src      SpO2      Weight      Height      Head Circumference      Peak Flow      Pain Score      Pain Loc      Pain Edu?      Excl. in [place]?    No data found.  Updated Vital Signs BP 136/84   Pulse 84   Temp 98.1 F (36.7 C) (Oral)   Resp 16   SpO2 97%   Visual  Acuity Right Eye Distance:   Left Eye Distance:   Bilateral Distance:    Right Eye Near:   Left Eye Near:    Bilateral Near:     Physical Exam Constitutional:      General: She is not in acute distress. HENT:     Head: Normocephalic and atraumatic.  Eyes:     General: No scleral icterus.    Pupils: Pupils are equal, round, and reactive to light.  Cardiovascular:     Rate and Rhythm: Normal rate.  Pulmonary:     Effort: Pulmonary effort is normal.  Abdominal:     General: Bowel sounds are normal.     Palpations: Abdomen is soft.     Tenderness: There is no abdominal tenderness. There is no right CVA tenderness, left CVA tenderness or guarding.  Genitourinary:    Exam position: Supine.     Pubic Area: No rash or pubic lice.      Tanner stage (genital): 5.     Labia:        Right: Tenderness present. No rash or lesion.        Left: Tenderness present. No rash or lesion.      Urethra: No prolapse or urethral swelling.     Comments: Labia smooth and shiny consistent with atrophic vaginitis.  No discharge in the introitus.  Internal exam deferred. Lymphadenopathy:     Lower Body: No right inguinal adenopathy. No left inguinal adenopathy.  Skin:    Coloration: Skin is not jaundiced or pale.  Neurological:     Mental Status: She is alert and oriented to person, place, and time.      UC Treatments / Results  Labs (all labs ordered are listed, but only abnormal results are displayed) Labs Reviewed - No data to display  EKG   Radiology No results found.  Procedures Procedures (including critical care time)  Medications Ordered in UC Medications - No data to display  Initial Impression / Assessment and Plan / UC Course  I have reviewed the triage vital signs and the nursing notes.  Pertinent labs & imaging results that were available during my care of the patient were reviewed by me and considered in my medical decision making (see chart for details).     H&P  consistent with atrophic vaginitis, though willing to prescribe Diflucan given history of yeast infection which feels similar.  No obvious discharge in introitus, internal exam deferred second to lack of pelvic, abdominal pain, patient preference.  Patient denies personal or  family history of gynecologic malignancy, hypercoagulable state, tobacco use.  Low risk of local/topical estrogen therapy: We will start estradiol no more than 3 times weekly and have patient follow-up with PCP.  Return precautions discussed, patient verbalized understanding and is agreeable to plan. Final Clinical Impressions(s) / UC Diagnoses   Final diagnoses:  Atrophic vaginitis     Discharge Instructions     Use vaginal cream at night no more than 3 times weekly, then may taper down as needed for comfort. Important follow-up with PCP regarding any refills. May also do sitz bath (couple of inches of warm water without salt) for additional vaginal relief.    ED Prescriptions    Medication Sig Dispense Auth. Provider   estradiol (ESTRACE) 0.1 MG/GM vaginal cream Place 1 Applicatorful vaginally at bedtime. Use 3 times weekly x 2 weeks, then taper as needed 42.5 g Hall-Potvin, [place], PA-C   fluconazole (DIFLUCAN) 200 MG tablet Take 1 tablet (200 mg total) by mouth once for 1 dose. May repeat in 72 hours if needed 2 tablet Hall-Potvin, [place], PA-C     PDMP not reviewed this encounter.   Odette FractionHall-Potvin, [place], [place]-C 04/08/19 [phone]

## 2019-04-08 NOTE — Discharge Instructions (Addendum)
Use vaginal cream at night no more than 3 times weekly, then may taper down as needed for comfort. Important follow-up with PCP regarding any refills. May also do sitz bath (couple of inches of warm water without salt) for additional vaginal relief.

## 2019-04-08 NOTE — ED Triage Notes (Signed)
Pt presents to [facility] for assessment of vaginal itching and irritation x 1 week.  Tried monestat at home without relief.  C/o about a "bump" to the left groin.

## 2019-04-08 NOTE — ED Notes (Signed)
Patient able to ambulate independently  

## 2019-04-10 ENCOUNTER — Telehealth (INDEPENDENT_AMBULATORY_CARE_PROVIDER_SITE_OTHER): Payer: Self-pay | Admitting: Student in an Organized Health Care Education/Training Program

## 2019-04-11 ENCOUNTER — Telehealth (INDEPENDENT_AMBULATORY_CARE_PROVIDER_SITE_OTHER): Payer: Self-pay | Admitting: Internal Medicine

## 2019-04-11 ENCOUNTER — Other Ambulatory Visit: Payer: Self-pay

## 2019-04-11 ENCOUNTER — Ambulatory Visit
Admission: EM | Admit: 2019-04-11 | Discharge: 2019-04-11 | Disposition: A | Discharge status: Home / Self Care | Payer: Medicare Other | Attending: Physician Assistant | Admitting: Physician Assistant

## 2019-04-11 DIAGNOSIS — J069 Acute upper respiratory infection, unspecified: Secondary | ICD-10-CM | POA: Diagnosis not present

## 2019-04-11 DIAGNOSIS — Z20828 Contact with and (suspected) exposure to other viral communicable diseases: Secondary | ICD-10-CM

## 2019-04-11 DIAGNOSIS — Z20822 Contact with and (suspected) exposure to covid-19: Secondary | ICD-10-CM

## 2019-04-11 NOTE — Telephone Encounter (Signed)
Pt is calling to fu on the messages she had sent provider from 12/10 and also to update pcp that she recently tested for COVID19 as she was possibly exposed but results are still pending (tested today). Pt is currently at [facility] and was tested @ an UC and per nurse stated to check within 3-5 days for results as pt is currently experiencing cold-like symptoms. Pt is requesting for a zpak to alleviate her symptoms. Please advise, thank you!    [facility] - [place] - [address] [place] [address] [phone] (Phone)  [phone] (Fax)

## 2019-04-11 NOTE — Telephone Encounter (Signed)
Please advice  

## 2019-04-11 NOTE — Telephone Encounter (Signed)
Needs visit

## 2019-04-11 NOTE — ED Triage Notes (Signed)
Pt states had a positive covid exposure on 12/12, developed a cough on Monday

## 2019-04-11 NOTE — ED Provider Notes (Signed)
[facility]    CSN: [id number] Arrival date & time: 04/11/19  1324      History   Chief Complaint Chief Complaint  Patient presents with  . Cough    HPI MARK BENECKE is a 63 y.o. female.   The history is provided by the patient. No language interpreter was used.  Cough Cough characteristics:  Productive Sputum characteristics:  Nondescript Severity:  Moderate Timing:  Constant Progression:  Worsening Chronicity:  New Relieved by:  Nothing Worsened by:  Nothing  Pt reports she get this every year. Pt states she normally takes zithromax.   Pt reports she was exposed to covid 12/12 and wants to be tested.  Pt is here with family to be tested.  Past Medical History:  Diagnosis Date  . High cholesterol   . Hypertension     There are no problems to display for this patient.   Past Surgical History:  Procedure Laterality Date  . BRAIN SURGERY    . BREAST EXCISIONAL BIOPSY Right    benign  . EYE SURGERY      OB History   No obstetric history on file.      Home Medications    Prior to Admission medications   Medication Sig Start Date End Date Taking? Authorizing Provider  acetaZOLAMIDE (DIAMOX) 125 MG tablet Take 125 mg by mouth at bedtime as needed.     [provider]  co-enzyme Q-10 30 MG capsule Take 30 mg by mouth every other day.    [provider]  Difluprednate (DUREZOL) 0.05 % EMUL Place 1 drop into the left eye daily.     [provider]  estradiol (ESTRACE) 0.1 MG/GM vaginal cream Place 1 Applicatorful vaginally at bedtime. Use 3 times weekly x 2 weeks, then taper as needed 04/08/19   Hall-Potvin, [place], PA-C  ipratropium (ATROVENT) 0.06 % nasal spray Place 2 sprays into both nostrils 4 (four) times daily. Patient not taking: Reported on 02/11/2018 07/16/17   Ok Edwards, PA-C  Iron-Vitamins (GERITOL COMPLETE PO) Take 1 tablet by mouth every other day.    [provider]    lisinopril-hydrochlorothiazide (PRINZIDE,ZESTORETIC) 10-12.5 MG tablet Take 1 tablet by mouth daily.    [provider]  nortriptyline (PAMELOR) 10 MG capsule Take 10 mg by mouth at bedtime as needed.     [provider]  rosuvastatin (CRESTOR) 40 MG tablet Take 40 mg by mouth at bedtime. 10/22/17   [provider]  Vitamin D, Ergocalciferol, (DRISDOL) 50000 units CAPS capsule Take 50,000 Units by mouth every 7 (seven) days.    [provider]    Family History Family History  Problem Relation Age of Onset  . Hypertension Mother   . Diabetes Mother   . Diabetes Father     Social History Social History   Tobacco Use  . Smoking status: Never Smoker  . Smokeless tobacco: Never Used  Substance Use Topics  . Alcohol use: No  . Drug use: Not on file     Allergies   Atorvastatin, Iodine, Penicillins, Rosuvastatin, Simvastatin, Shellfish allergy, and Tetanus toxoids   Review of Systems Review of Systems  Respiratory: Positive for cough.   All other systems reviewed and are negative.    Physical Exam Triage Vital Signs ED Triage Vitals [04/11/19 1409]  Enc Vitals Group     BP (!) 143/66     Pulse Rate 90     Resp 18     Temp 98.4  F (36.9 C)     Temp Source Oral     SpO2 98 %     Weight      Height      Head Circumference      Peak Flow      Pain Score 0     Pain Loc      Pain Edu?      Excl. in [place]?    No data found.  Updated Vital Signs BP (!) 143/66 (BP Location: Left Arm)   Pulse 90   Temp 98.4 F (36.9 C) (Oral)   Resp 18   SpO2 98%   Visual Acuity Right Eye Distance:   Left Eye Distance:   Bilateral Distance:    Right Eye Near:   Left Eye Near:    Bilateral Near:     Physical Exam Vitals and nursing note reviewed.  Constitutional:      Appearance: She is well-developed.  HENT:     Head: Normocephalic.  Cardiovascular:     Rate and Rhythm: Normal rate and regular rhythm.  Pulmonary:     Effort:  Pulmonary effort is normal.  Abdominal:     General: There is no distension.  Musculoskeletal:        General: Normal range of motion.     Cervical back: Normal range of motion.  Skin:    General: Skin is warm.  Neurological:     Mental Status: She is alert and oriented to person, place, and time.  Psychiatric:        Mood and Affect: Mood normal.      UC Treatments / Results  Labs (all labs ordered are listed, but only abnormal results are displayed) Labs Reviewed  NOVEL CORONAVIRUS, NAA    EKG   Radiology No results found.  Procedures Procedures (including critical care time)  Medications Ordered in UC Medications - No data to display  Initial Impression / Assessment and Plan / UC Course  I have reviewed the triage vital signs and the nursing notes.  Pertinent labs & imaging results that were available during my care of the patient were reviewed by me and considered in my medical decision making (see chart for details).     MDM: covid ordered. Pt given information on upper resp infection.  Pt advised I think she has a a viral illness. I would not advise treatment with zithromax.  Pt advised to follow up with her MD if symptoms persist  Final Clinical Impressions(s) / UC Diagnoses   Final diagnoses:  Acute upper respiratory infection  Close exposure to COVID-19 virus   Discharge Instructions   None    ED Prescriptions    None    An After Visit Summary was printed and given to the patient.  PDMP not reviewed this encounter.   Elson Areas, [place] 04/11/19 1423

## 2019-04-12 LAB — NOVEL CORONAVIRUS, NAA: SARS-CoV-2, NAA: DETECTED — AB

## 2019-04-14 ENCOUNTER — Telehealth (HOSPITAL_COMMUNITY): Payer: Self-pay | Admitting: Emergency Medicine

## 2019-04-14 ENCOUNTER — Other Ambulatory Visit: Payer: Self-pay | Admitting: Nurse Practitioner

## 2019-04-14 ENCOUNTER — Telehealth: Payer: Self-pay | Admitting: Nurse Practitioner

## 2019-04-14 DIAGNOSIS — I10 Essential (primary) hypertension: Secondary | ICD-10-CM

## 2019-04-14 DIAGNOSIS — U071 COVID-19: Secondary | ICD-10-CM

## 2019-04-14 NOTE — Telephone Encounter (Signed)
FYI  Called and informed Wendy [name]. Wendy [name] stated that Wendy [name] no longer need this med and is tested positive for COVID. Wendy [name] also would like to inform Dr. Konrad Dolores that she is going for Monoclonal antibodies therapy this Wednesday for COVID.

## 2019-04-14 NOTE — Telephone Encounter (Signed)
Called to Discuss with patient about Covid symptoms and the use of bamlanivimab, a monoclonal antibody infusion for those with mild to moderate Covid symptoms and at a high risk of hospitalization.     Pt is qualified for this infusion at the [facility] infusion center due to co-morbid conditions and/or a member of an at-risk group.     Patient is being managed for hypertension.   Patient declines infusion at this time. Symptoms tier reviewed as well as criteria for ending isolation. Preventative practices reviewed. Patient verbalized understanding.    Patient advised to call back if she decides that she does want to get infusion. Callback number to the infusion center given. Patient advised to go to Urgent care or ED with severe symptoms.   Note: Patient states that symptom onset was 04/07/19 - Wednesday will be the last day she can receive infusion.

## 2019-04-14 NOTE — Telephone Encounter (Signed)

## 2019-04-14 NOTE — Progress Notes (Signed)
  I connected by phone with Annette Bentley on 04/14/2019 at 10:08 AM to discuss the potential use of an new treatment for mild to moderate COVID-19 viral infection in non-hospitalized patients.  This patient is a 63 y.o. female that meets the FDA criteria for Emergency Use Authorization of bamlanivimab or casirivimab\imdevimab.  Has a (+) direct SARS-CoV-2 viral test result  Has mild or moderate COVID-19   Is ? 63 years of age and weighs ? 40 kg  Is NOT hospitalized due to COVID-19  Is NOT requiring oxygen therapy or requiring an increase in baseline oxygen flow rate due to COVID-19  Is within 10 days of symptom onset  Has at least one of the high risk factor(s) for progression to severe COVID-19 and/or hospitalization as defined in EUA.  Specific high risk criteria : Hypertension   I have spoken and communicated the following to the patient or parent/caregiver:  1. FDA has authorized the emergency use of bamlanivimab and casirivimab\imdevimab for the treatment of mild to moderate COVID-19 in adults and pediatric patients with positive results of direct SARS-CoV-2 viral testing who are 30 years of age and older weighing at least 40 kg, and who are at high risk for progressing to severe COVID-19 and/or hospitalization.  2. The significant known and potential risks and benefits of bamlanivimab and casirivimab\imdevimab, and the extent to which such potential risks and benefits are unknown.  3. Information on available alternative treatments and the risks and benefits of those alternatives, including clinical trials.  4. Patients treated with bamlanivimab and casirivimab\imdevimab should continue to self-isolate and use infection control measures (e.g., wear mask, isolate, social distance, avoid sharing personal items, clean and disinfect "high touch" surfaces, and frequent handwashing) according to CDC guidelines.   5. The patient or parent/caregiver has the option to accept or refuse  bamlanivimab or casirivimab\imdevimab .  After reviewing this information with the patient, The patient agreed to proceed with receiving the bamlanimivab infusion and will be provided a copy of the Fact sheet prior to receiving the infusion.Fenton Foy 04/14/2019 10:08 AM

## 2019-04-16 ENCOUNTER — Ambulatory Visit (HOSPITAL_COMMUNITY)
Admission: RE | Admit: 2019-04-16 | Discharge: 2019-04-16 | Disposition: A | Discharge status: Home / Self Care | Payer: Medicare Other | Source: Ambulatory Visit | Attending: Pulmonary Disease | Admitting: Pulmonary Disease

## 2019-04-16 ENCOUNTER — Encounter (HOSPITAL_COMMUNITY): Payer: Self-pay

## 2019-04-16 DIAGNOSIS — Z23 Encounter for immunization: Secondary | ICD-10-CM | POA: Diagnosis present

## 2019-04-16 DIAGNOSIS — U071 COVID-19: Secondary | ICD-10-CM | POA: Insufficient documentation

## 2019-04-16 DIAGNOSIS — I10 Essential (primary) hypertension: Secondary | ICD-10-CM | POA: Diagnosis not present

## 2019-04-16 MED ORDER — SODIUM CHLORIDE 0.9 % IV SOLN
700.0000 mg | Freq: Once | INTRAVENOUS | Status: AC
  Administered 2019-04-16: 700 mg via INTRAVENOUS
  Filled 2019-04-16: qty 20

## 2019-04-16 MED ORDER — METHYLPREDNISOLONE SODIUM SUCC 125 MG IJ SOLR: 125.0000 mg | Freq: Once | INTRAMUSCULAR | Status: DC | PRN

## 2019-04-16 MED ORDER — SODIUM CHLORIDE 0.9 % IV SOLN
INTRAVENOUS | Status: DC | PRN
  Administered 2019-04-16: 250 mL via INTRAVENOUS

## 2019-04-16 MED ORDER — DIPHENHYDRAMINE HCL 50 MG/ML IJ SOLN: 50.0000 mg | Freq: Once | INTRAMUSCULAR | Status: DC | PRN

## 2019-04-16 MED ORDER — EPINEPHRINE 0.3 MG/0.3ML IJ SOAJ: 0.3000 mg | Freq: Once | INTRAMUSCULAR | Status: DC | PRN

## 2019-04-16 MED ORDER — FAMOTIDINE IN NACL 20-0.9 MG/50ML-% IV SOLN: 20.0000 mg | Freq: Once | INTRAVENOUS | Status: DC | PRN

## 2019-04-16 MED ORDER — ALBUTEROL SULFATE HFA 108 (90 BASE) MCG/ACT IN AERS: 2.0000 | INHALATION_SPRAY | Freq: Once | RESPIRATORY_TRACT | Status: DC | PRN

## 2019-04-16 NOTE — Discharge Instructions (Signed)
COVID-19: How to Protect Yourself and Others Know how it spreads  There is currently no vaccine to prevent coronavirus disease 2019 (COVID-19).  The best way to prevent illness is to avoid being exposed to this virus.  The virus is thought to spread mainly from person-to-person. ? Between people who are in close contact with one another (within about 6 feet). ? Through respiratory droplets produced when an infected person coughs, sneezes or talks. ? These droplets can land in the mouths or noses of people who are nearby or possibly be inhaled into the lungs. ? Some recent studies have suggested that COVID-19 may be spread by people who are not showing symptoms. Everyone should Clean your hands often  Wash your hands often with soap and water for at least 20 seconds especially after you have been in a public place, or after blowing your nose, coughing, or sneezing.  If soap and water are not readily available, use a hand sanitizer that contains at least 60% alcohol. Cover all surfaces of your hands and rub them together until they feel dry.  Avoid touching your eyes, nose, and mouth with unwashed hands. Avoid close contact  Stay home if you are sick.  Avoid close contact with people who are sick.  Put distance between yourself and other people. ? Remember that some people without symptoms may be able to spread virus. ? This is especially important for people who are at higher risk of getting very sick.www.cdc.gov/coronavirus/2019-ncov/need-extra-precautions/people-at-higher-risk.html Cover your mouth and nose with a cloth face cover when around others  You could spread COVID-19 to others even if you do not feel sick.  Everyone should wear a cloth face cover when they have to go out in public, for example to the grocery store or to pick up other necessities. ? Cloth face coverings should not be placed on young children under age 2, anyone who has trouble breathing, or is unconscious,  incapacitated or otherwise unable to remove the mask without assistance.  The cloth face cover is meant to protect other people in case you are infected.  Do NOT use a facemask meant for a healthcare worker.  Continue to keep about 6 feet between yourself and others. The cloth face cover is not a substitute for social distancing. Cover coughs and sneezes  If you are in a private setting and do not have on your cloth face covering, remember to always cover your mouth and nose with a tissue when you cough or sneeze or use the inside of your elbow.  Throw used tissues in the trash.  Immediately wash your hands with soap and water for at least 20 seconds. If soap and water are not readily available, clean your hands with a hand sanitizer that contains at least 60% alcohol. Clean and disinfect  Clean AND disinfect frequently touched surfaces daily. This includes tables, doorknobs, light switches, countertops, handles, desks, phones, keyboards, toilets, faucets, and sinks. www.cdc.gov/coronavirus/2019-ncov/prevent-getting-sick/disinfecting-your-home.html  If surfaces are dirty, clean them: Use detergent or soap and water prior to disinfection.  Then, use a household disinfectant. You can see a list of EPA-registered household disinfectants here. cdc.gov/coronavirus 08/27/2018 This information is not intended to replace advice given to you by your health care provider. Make sure you discuss any questions you have with your health care provider. Document Released: 08/06/2018 Document Revised: 09/04/2018 Document Reviewed: 08/06/2018 Elsevier Patient Education  2020 Elsevier Inc.  

## 2019-04-16 NOTE — Progress Notes (Signed)
  Diagnosis: COVID-19  Physician: Dr. Asencion Noble  Procedure: [facility] Infusion [facility] Med: bamlanivimab infusion - Provided patient with bamlanimivab fact sheet for patients, parents and caregivers prior to infusion.  Complications: No immediate complications noted.  Discharge: Discharged home   Bryor Rami L 04/16/2019

## 2019-04-21 ENCOUNTER — Other Ambulatory Visit (INDEPENDENT_AMBULATORY_CARE_PROVIDER_SITE_OTHER): Payer: Self-pay

## 2019-04-21 DIAGNOSIS — I10 Essential (primary) hypertension: Secondary | ICD-10-CM

## 2019-04-21 MED ORDER — LISINOPRIL-HYDROCHLOROTHIAZIDE 10-12.5 MG PO TABS
1.00 | ORAL_TABLET | Freq: Every day | ORAL | 1 refills | Status: DC
Start: 2019-04-21 — End: 2019-07-30

## 2019-04-27 ENCOUNTER — Encounter (INDEPENDENT_AMBULATORY_CARE_PROVIDER_SITE_OTHER): Payer: Self-pay | Admitting: Student in an Organized Health Care Education/Training Program

## 2019-04-28 ENCOUNTER — Encounter (INDEPENDENT_AMBULATORY_CARE_PROVIDER_SITE_OTHER): Payer: Self-pay | Admitting: Student in an Organized Health Care Education/Training Program

## 2019-04-28 ENCOUNTER — Telehealth (INDEPENDENT_AMBULATORY_CARE_PROVIDER_SITE_OTHER)
Payer: No Typology Code available for payment source | Admitting: Student in an Organized Health Care Education/Training Program

## 2019-04-28 ENCOUNTER — Encounter (INDEPENDENT_AMBULATORY_CARE_PROVIDER_SITE_OTHER): Payer: Self-pay

## 2019-04-28 DIAGNOSIS — R7989 Other specified abnormal findings of blood chemistry: Secondary | ICD-10-CM

## 2019-04-28 DIAGNOSIS — E782 Mixed hyperlipidemia: Secondary | ICD-10-CM

## 2019-04-28 DIAGNOSIS — U071 COVID-19: Secondary | ICD-10-CM

## 2019-04-28 MED ORDER — AZITHROMYCIN 250 MG PO TABS
ORAL_TABLET | ORAL | 0 refills | Status: AC
Start: 2019-04-28 — End: 2019-05-03

## 2019-04-28 MED ORDER — ROSUVASTATIN CALCIUM 40 MG PO TABS
ORAL_TABLET | ORAL | 1 refills | Status: DC
Start: 2019-04-28 — End: 2020-01-15

## 2019-04-28 MED ORDER — VITAMIN D (ERGOCALCIFEROL) 1.25 MG (50000 UT) PO CAPS
50000.00 [IU] | ORAL_CAPSULE | ORAL | 3 refills | Status: DC
Start: 2019-04-28 — End: 2019-07-02

## 2019-04-28 MED ORDER — ALBUTEROL SULFATE HFA 108 (90 BASE) MCG/ACT IN AERS
2.0000 | INHALATION_SPRAY | RESPIRATORY_TRACT | 5 refills | Status: DC | PRN
Start: 2019-04-28 — End: 2019-07-02

## 2019-04-28 NOTE — Progress Notes (Signed)
PT called to request on the message sent to her PCP, she was given a 12:30 (we had a cancellation )     I asked her who was her PCP, she said is Dr. Leonard Schwartz,  I told her, she has a different name in the system, she requested to please change it. Done.

## 2019-04-28 NOTE — Progress Notes (Signed)
CC: COVID         Verbal consent has been obtained from the patient to conduct a video and telephone visit to minimize exposure to COVID-19: Yes     A two-way, synchronous, real- time, audio and visual interactive communication system between the patient and myself was utilized.      Time Spent: 15 min.     Video used: zoom         Physical examination is limited to visual inspection with patient's input on video visit.     64 yo F , Hx HTN/HLD here c/o cough     On 12/18 -- was tested positive for covid after URI illness , + covid exposure 2 days prior   She c/o   She continues to have Productive cough  Some Sob/dyspnea  ++Fatigue  Sore throat  ++Congestion  ++Rhinorrhea  ++Headache  GI symptoms - n/v/diarrhea/abdominal pain  +++Loss of taste   ++Loss of smell    She was seen by [facility] and had AB infusion : Procedure: [facility] Med: bamlanivimab infusion - Provided patient with bamlanimivab fact sheet for patients, parents and caregivers prior to infusion.      COVID retest   On 12/23 was neg     She has not had CXR     No Hx lung Dz   No smoking         Hx HLD  Compliant with cholesterol medication  No Myalgias,  Muscle aches , bodyaches  Compliant with low saturated and low carbohydrate Diet  No hx abnormal liver test related to statin therapy             Past Medical History:   Diagnosis Date    Brain tumor (benign)     Hypercholesteremia     Hypertensive disorder       Past Surgical History:   Procedure Laterality Date    BRAIN SURGERY  2009    BREAST BIOPSY Right 2008    right breast biopsy- benign    BREAST BIOPSY Right 1970s    right breast tumor removed 40+ years ago - benign    BREAST CYST EXCISION  1970s    right breast tumor removed 40+ years ago - benign    CESAREAN SECTION      CORNEAL TRANSPLANT      EYE SURGERY      TUBAL LIGATION        (Not in a hospital admission)    Current/Home Medications    ACETAZOLAMIDE (DIAMOX) 125 MG TABLET    Take 2 tablets (250 mg total) by mouth  as needed (Intracranial pressure)    CO-ENZYME Q-10 50 MG CAPSULE    Take 50 mg by mouth daily.    DIFLUPREDNATE (DUREZOL) 0.05 % EMULSION OPHTHALMIC SOLUTION    Place 1 drop into the left eye daily.     FLUTICASONE (FLONASE) 50 MCG/ACT NASAL SPRAY        LISINOPRIL-HYDROCHLOROTHIAZIDE (ZESTORETIC) 10-12.5 MG PER TABLET    Take 1 tablet by mouth daily    MULTIPLE VITAMIN (MULTIVITAMIN) TABLET    Take 1 tablet by mouth daily.    NORTRIPTYLINE (PAMELOR) 10 MG CAPSULE    as needed       NORTRIPTYLINE (PAMELOR) 10 MG CAPSULE    Take 10 mg by mouth    ROSUVASTATIN (CRESTOR) 40 MG TABLET    1 tab qod    TRIAMCINOLONE (KENALOG) 0.025 % OINTMENT    Apply topically  2 (two) times daily    VITAMIN D, ERGOCALCIFEROL, (DRISDOL) 50000 UNIT CAP    Take 1 capsule (50,000 Units total) by mouth once a week     Allergies   Allergen Reactions    Iodine Shortness Of Breath    Penicillins Shortness Of Breath     Has patient had a PCN reaction causing immediate rash, facial/tongue/throat swelling, SOB or lightheadedness with hypotension: Yes  Has patient had a PCN reaction causing severe rash involving mucus membranes or skin necrosis: No  Has patient had a PCN reaction that required hospitalization: No  Has patient had a PCN reaction occurring within the last 10 years: Yes  If all of the above answers are "NO", then may proceed with Cephalosporin use.    Rosuvastatin Shortness Of Breath     Throat swelling after Lisinopril was added, previous P[CP Stopped Crestor and Pt has been doing fine   Throat swelling after Lisinopril was added, previous P[CP Stopped Crestor and Pt has been doing fine   Throat swelling after Lisinopril was added, previous P[CP Stopped Crestor and Pt has been doing fine     Shellfish-Derived Products Anaphylaxis    Atorvastatin      Joint pain     Tetanus Immune Globulin      Arm swelling     Zocor [Simvastatin]      No response       Social History     Tobacco Use    Smoking status: Never Smoker     Smokeless tobacco: Never Used   Substance Use Topics    Alcohol use: Yes     Alcohol/week: 1.0 - 2.0 standard drinks     Types: 1 - 2 Shots of liquor per week     Frequency: Monthly or less     Binge frequency: Monthly      Family History   Problem Relation Age of Onset    Diabetes Mother     Hypertension Mother     Diabetes Father     Hypertension Father     Breast cancer Neg Hx         Review of Systems - General ROS: negative for - chills, fatigue, fever or malaise  Ophthalmic ROS: negative for - excessive tearing or itchy eyes  ENT ROS: positive for - headaches, nasal congestion, sinus pain and sore throat  negative for - tinnitus, vertigo or visual changes  Allergy and Immunology ROS: negative for - itchy/watery eyes, postnasal drip or seasonal allergies  Respiratory ROS: positive for - cough  negative for - shortness of breath or wheezing  Cardiovascular ROS: no chest pain or dyspnea on exertion  Other ROS were reviewed and were neg         General Appearance:  Alert, cooperative, no distress, appears stated age   Head: Normocephalic, without obvious abnormality, atraumatic   Eyes:  Sclera anicteric, conjunctiva without pallor   Lungs:   Breathing comfortably without accessory muscle use. Normal respiratory effort.    Musculoskeletal: No apparent gait or station abnormalities    Skin: Normal skin color, texture, no visible rashes or lesions   Extremities: No apparent edema   Neurologic: Alert and oriented x3, normal mood and affect. Grossly intact           A/P  1. COVID-19  azithromycin (Zithromax Z-Pak) 250 MG tablet    albuterol sulfate HFA (PROVENTIL) 108 (90 Base) MCG/ACT inhaler   2. Low vitamin D level  vitamin D, ergocalciferol, (  DRISDOL) 50000 UNIT Cap   3. Mixed hyperlipidemia  rosuvastatin (CRESTOR) 40 MG tablet                COVID-19 Supportive Therapy:    If you are sick with COVID-19 or think you might have COVID-19, stay home, rest, and separate yourself from other people in the home as  much as possible.   Most people will develop mild to moderate symptoms, such as fever and cough, that will get better without medical help. By staying home, you reduce the chance of spreading COVID-19 to others, including healthcare workers who are needed to care for the more seriously ill. If you require medical attention, call ahead. The most common symptoms are fever, cough, and shortness of breath. Other common symptoms include fatigue, decreased appetite, and muscle or body aches. Not everyone with COVID-19 will have all symptoms and fever might not be present. Get rest and drink plenty of water or clear liquids. Avoid alcohol or drinks with caffeine, such as sodas, tea, and coffee.     Self-isolate for at least 3 full days after you no longer have a fever (without the use of fever-reducing medications) AND other symptoms are greatly improved AND at least 7 days have passed since symptoms first started.Treat symptoms with non-prescription medicines and call ahead before visiting our practice.    Use over-the-counter medications based on your symptoms. Follow all usage and warning information on the label. There is no specific treatment for COVID-19. There is no specific antiviral treatment recommended for COVID-19. People with COVID-19 should receive supportive care to help relieve symptoms.     - Stuffy/runny nose - Use a nasal decongestant that contains phenylephrine or pseudoephedrine (e.g, Sudafed), saline nasal spray, or oral antihistamines (e.g., Claritin or Zyrtec)  - Dry cough (without mucus) - Use a cough suppressant that contains dextromethorphan (e.g., Delsym)  - Productive cough (wet cough with mucus) - Use an expectorant that contains guaifenesin (e.g., Robitussin or Mucinex).    - Both productive and dry cough - Use a combination guaifenesin/dextromethorphan product (e.g., Mucinex DM or Robitussin DM)   - Sore throat - Use a mild pain reliever such as acetaminophen (Tylenol), throat sprays like  chloraseptic spray, or cough drops  - Fever or headache or body aches - Use a mild pain reliever such as acetaminophen (Tylenol) or ibuprofen (e.g., Advil or Motrin)    Seek medical attention immediately if your illness is getting worse (e.g., difficulty breathing or persistent fever after using fever reducing medication). Call the doctors office and tell them you have or may have COVID-19. Get immediate medical attention if you have emergency signs (e.g., pain or pressure in the chest that doesnt go away, new confusion or inability to arouse, or bluish lips or face).           > 20 min  spent withPt , > 50 % were spent in counseling and care coordination, and discussing differential

## 2019-04-28 NOTE — Patient Instructions (Signed)
COVID-19 Supportive Therapy:    If you are sick with COVID-19 or think you might have COVID-19, stay home, rest, and separate yourself from other people in the home as much as possible.   Most people will develop mild to moderate symptoms, such as fever and cough, that will get better without medical help. By staying home, you reduce the chance of spreading COVID-19 to others, including healthcare workers who are needed to care for the more seriously ill. If you require medical attention, call ahead. The most common symptoms are fever, cough, and shortness of breath. Other common symptoms include fatigue, decreased appetite, and muscle or body aches. Not everyone with COVID-19 will have all symptoms and fever might not be present. Get rest and drink plenty of water or clear liquids. Avoid alcohol or drinks with caffeine, such as sodas, tea, and coffee.     Self-isolate for at least 3 full days after you no longer have a fever (without the use of fever-reducing medications) AND other symptoms are greatly improved AND at least 7 days have passed since symptoms first started.Treat symptoms with non-prescription medicines and call ahead before visiting our practice.    Use over-the-counter medications based on your symptoms. Follow all usage and warning information on the label. There is no specific treatment for COVID-19. There is no specific antiviral treatment recommended for COVID-19. People with COVID-19 should receive supportive care to help relieve symptoms.     - Stuffy/runny nose - Use a nasal decongestant that contains phenylephrine or pseudoephedrine (e.g, Sudafed), saline nasal spray, or oral antihistamines (e.g., Claritin or Zyrtec)  - Dry cough (without mucus) - Use a cough suppressant that contains dextromethorphan (e.g., Delsym)  - Productive cough (wet cough with mucus) - Use an expectorant that contains guaifenesin (e.g., Robitussin or Mucinex).    - Both productive and dry cough - Use a  combination guaifenesin/dextromethorphan product (e.g., Mucinex DM or Robitussin DM)   - Sore throat - Use a mild pain reliever such as acetaminophen (Tylenol), throat sprays like chloraseptic spray, or cough drops  - Fever or headache or body aches - Use a mild pain reliever such as acetaminophen (Tylenol) or ibuprofen (e.g., Advil or Motrin)    Seek medical attention immediately if your illness is getting worse (e.g., difficulty breathing or persistent fever after using fever reducing medication). Call the doctor's office and tell them you have or may have COVID-19. Get immediate medical attention if you have emergency signs (e.g., pain or pressure in the chest that doesn't go away, new confusion or inability to arouse, or bluish lips or face).

## 2019-04-30 ENCOUNTER — Emergency Department (HOSPITAL_COMMUNITY): Payer: Medicare Other

## 2019-04-30 ENCOUNTER — Emergency Department (HOSPITAL_COMMUNITY)
Admission: EM | Admit: 2019-04-30 | Discharge: 2019-04-30 | Disposition: A | Discharge status: Home / Self Care | Payer: Medicare Other | Attending: Emergency Medicine | Admitting: Emergency Medicine

## 2019-04-30 ENCOUNTER — Other Ambulatory Visit: Payer: Self-pay

## 2019-04-30 ENCOUNTER — Encounter (HOSPITAL_COMMUNITY): Payer: Self-pay

## 2019-04-30 DIAGNOSIS — I10 Essential (primary) hypertension: Secondary | ICD-10-CM | POA: Diagnosis not present

## 2019-04-30 DIAGNOSIS — J40 Bronchitis, not specified as acute or chronic: Secondary | ICD-10-CM | POA: Insufficient documentation

## 2019-04-30 DIAGNOSIS — Z79899 Other long term (current) drug therapy: Secondary | ICD-10-CM | POA: Diagnosis not present

## 2019-04-30 DIAGNOSIS — R05 Cough: Secondary | ICD-10-CM | POA: Diagnosis present

## 2019-04-30 MED ORDER — PREDNISONE 20 MG PO TABS: 40.0000 mg | ORAL_TABLET | Freq: Every day | ORAL | 0 refills | Status: DC

## 2019-04-30 NOTE — ED Triage Notes (Signed)
Pt was positive for COVID early in December but has recently tested negative on the 30th, she states that she's been using her inhaler but it feels like its making her wheezing and coughing worse

## 2019-05-02 ENCOUNTER — Encounter (INDEPENDENT_AMBULATORY_CARE_PROVIDER_SITE_OTHER): Payer: Self-pay | Admitting: Family Medicine

## 2019-05-07 NOTE — ED Provider Notes (Signed)
[facility] DEPT Provider Note   CSN: [id number] Arrival date & time: 04/30/19  0541     History Chief Complaint  Patient presents with  . Cough  . Wheezing    Annette Bentley is a 65 y.o. female.  HPI   75yF with cough and wheezing. Has been going on for weeks. Treated with abx and bronchodilators. She feels like she is no better. No fever. No edema. Cough is keeping her up at night.   Past Medical History:  Diagnosis Date  . High cholesterol   . Hypertension     There are no problems to display for this patient.   Past Surgical History:  Procedure Laterality Date  . BRAIN SURGERY    . BREAST EXCISIONAL BIOPSY Right    benign  . EYE SURGERY       OB History   No obstetric history on file.     Family History  Problem Relation Age of Onset  . Hypertension Mother   . Diabetes Mother   . Diabetes Father     Social History   Tobacco Use  . Smoking status: Never Smoker  . Smokeless tobacco: Never Used  Substance Use Topics  . Alcohol use: No  . Drug use: Never    Home Medications Prior to Admission medications   Medication Sig Start Date End Date Taking? Authorizing Provider  acetaZOLAMIDE (DIAMOX) 125 MG tablet Take 125 mg by mouth at bedtime as needed.     [provider]  co-enzyme Q-10 30 MG capsule Take 30 mg by mouth every other day.    [provider]  Difluprednate (DUREZOL) 0.05 % EMUL Place 1 drop into the left eye daily.     [provider]  estradiol (ESTRACE) 0.1 MG/GM vaginal cream Place 1 Applicatorful vaginally at bedtime. Use 3 times weekly x 2 weeks, then taper as needed 04/08/19   Hall-Potvin, [place], PA-C  ipratropium (ATROVENT) 0.06 % nasal spray Place 2 sprays into both nostrils 4 (four) times daily. Patient not taking: Reported on 02/11/2018 07/16/17   Ok Edwards, PA-C  Iron-Vitamins (GERITOL COMPLETE PO) Take 1 tablet by mouth every other day.    [provider]    lisinopril-hydrochlorothiazide (PRINZIDE,ZESTORETIC) 10-12.5 MG tablet Take 1 tablet by mouth daily.    [provider]  nortriptyline (PAMELOR) 10 MG capsule Take 10 mg by mouth at bedtime as needed.     [provider]  predniSONE (DELTASONE) 20 MG tablet Take 2 tablets (40 mg total) by mouth daily. 04/30/19   Virgel Manifold, MD  rosuvastatin (CRESTOR) 40 MG tablet Take 40 mg by mouth at bedtime. 10/22/17   [provider]  Vitamin D, Ergocalciferol, (DRISDOL) 50000 units CAPS capsule Take 50,000 Units by mouth every 7 (seven) days.    [provider]    Allergies    Atorvastatin, Iodine, Penicillins, Simvastatin, Shellfish allergy, and Tetanus toxoids  Review of Systems   Review of Systems All systems reviewed and negative, other than as noted in HPI.  Physical Exam Updated Vital Signs BP (!) 142/89   Pulse 92   Temp 98.7 F (37.1 C) (Oral)   Resp 16   Ht 5' 3.5" (1.613 m)   Wt 78.9 kg   SpO2 100%   BMI 30.34 kg/m   Physical Exam Vitals and nursing note reviewed.  Constitutional:      General: She is not in acute distress.    Appearance: She is well-developed.  HENT:     Head: Normocephalic and atraumatic.  Eyes:     General:        Right eye: No discharge.        Left eye: No discharge.     Conjunctiva/sclera: Conjunctivae normal.  Cardiovascular:     Rate and Rhythm: Normal rate and regular rhythm.     Heart sounds: Normal heart sounds. No murmur. No friction rub. No gallop.   Pulmonary:     Effort: Pulmonary effort is normal. No respiratory distress.     Breath sounds: Wheezing present.  Abdominal:     General: There is no distension.     Palpations: Abdomen is soft.     Tenderness: There is no abdominal tenderness.  Musculoskeletal:        General: No tenderness.     Cervical back: Neck supple.     Right lower leg: No edema.     Left lower leg: No edema.     Comments: Lower extremities symmetric as compared to each other.  No calf tenderness. Negative Homan's. No palpable cords.   Skin:    General: Skin is warm and dry.  Neurological:     Mental Status: She is alert.  Psychiatric:        Behavior: Behavior normal.        Thought Content: Thought content normal.     ED Results / Procedures / Treatments   Labs (all labs ordered are listed, but only abnormal results are displayed) Labs Reviewed - No data to display  EKG None  Radiology No results found.  Procedures Procedures (including critical care time)  Medications Ordered in ED Medications - No data to display  ED Course  I have reviewed the triage vital signs and the nursing notes.  Pertinent labs & imaging results that were available during my care of the patient were reviewed by me and considered in my medical decision making (see chart for details).    MDM Rules/Calculators/A&P                      63yF with cough. Ongoing for weeks. She is on an ACEI but has wheezing. Likely bronchitis. Afebrile. Nontoxic. Although some wheezing on exam, she is in no distress. Continue other meds PRN. Will try course of steroids.   Final Clinical Impression(s) / ED Diagnoses Final diagnoses:  Bronchitis    Rx / DC Orders ED Discharge Orders         Ordered    predniSONE (DELTASONE) 20 MG tablet  Daily     04/30/19 0919           Raeford Razor, MD 05/07/19 [phone]

## 2019-05-19 ENCOUNTER — Other Ambulatory Visit: Payer: Self-pay | Admitting: Student

## 2019-05-19 DIAGNOSIS — Z1231 Encounter for screening mammogram for malignant neoplasm of breast: Secondary | ICD-10-CM

## 2019-05-21 ENCOUNTER — Encounter (INDEPENDENT_AMBULATORY_CARE_PROVIDER_SITE_OTHER): Payer: Self-pay | Admitting: Student in an Organized Health Care Education/Training Program

## 2019-06-17 ENCOUNTER — Ambulatory Visit
Admission: RE | Admit: 2019-06-17 | Discharge: 2019-06-17 | Disposition: A | Discharge status: Home / Self Care | Payer: Medicare Other | Source: Ambulatory Visit | Attending: *Deleted | Admitting: *Deleted

## 2019-06-17 ENCOUNTER — Other Ambulatory Visit: Payer: Self-pay

## 2019-06-17 DIAGNOSIS — Z1231 Encounter for screening mammogram for malignant neoplasm of breast: Secondary | ICD-10-CM

## 2019-06-29 ENCOUNTER — Other Ambulatory Visit (INDEPENDENT_AMBULATORY_CARE_PROVIDER_SITE_OTHER): Payer: Self-pay | Admitting: Family Medicine

## 2019-07-02 ENCOUNTER — Encounter (INDEPENDENT_AMBULATORY_CARE_PROVIDER_SITE_OTHER): Payer: Self-pay | Admitting: Student in an Organized Health Care Education/Training Program

## 2019-07-02 ENCOUNTER — Ambulatory Visit (INDEPENDENT_AMBULATORY_CARE_PROVIDER_SITE_OTHER)
Payer: No Typology Code available for payment source | Admitting: Student in an Organized Health Care Education/Training Program

## 2019-07-02 VITALS — BP 150/89 | HR 83 | Temp 97.7°F | Resp 12 | Ht 62.5 in | Wt 175.6 lb

## 2019-07-02 DIAGNOSIS — Z0000 Encounter for general adult medical examination without abnormal findings: Secondary | ICD-10-CM

## 2019-07-02 DIAGNOSIS — I10 Essential (primary) hypertension: Secondary | ICD-10-CM

## 2019-07-02 DIAGNOSIS — R7989 Other specified abnormal findings of blood chemistry: Secondary | ICD-10-CM

## 2019-07-02 DIAGNOSIS — Z13820 Encounter for screening for osteoporosis: Secondary | ICD-10-CM

## 2019-07-02 DIAGNOSIS — E782 Mixed hyperlipidemia: Secondary | ICD-10-CM

## 2019-07-02 DIAGNOSIS — N761 Subacute and chronic vaginitis: Secondary | ICD-10-CM

## 2019-07-02 DIAGNOSIS — R7309 Other abnormal glucose: Secondary | ICD-10-CM

## 2019-07-02 DIAGNOSIS — G932 Benign intracranial hypertension: Secondary | ICD-10-CM

## 2019-07-02 LAB — CBC AND DIFFERENTIAL
Absolute NRBC: 0 10*3/uL (ref 0.00–0.00)
Basophils Absolute Automated: 0.02 10*3/uL (ref 0.00–0.08)
Basophils Automated: 0.4 %
Eosinophils Absolute Automated: 0.8 10*3/uL — ABNORMAL HIGH (ref 0.00–0.44)
Eosinophils Automated: 16.1 %
Hematocrit: 41.1 % (ref 34.7–43.7)
Hgb: 12.8 g/dL (ref 11.4–14.8)
Immature Granulocytes Absolute: 0.01 10*3/uL (ref 0.00–0.07)
Immature Granulocytes: 0.2 %
Lymphocytes Absolute Automated: 1.9 10*3/uL (ref 0.42–3.22)
Lymphocytes Automated: 38.3 %
MCH: 28.8 pg (ref 25.1–33.5)
MCHC: 31.1 g/dL — ABNORMAL LOW (ref 31.5–35.8)
MCV: 92.6 fL (ref 78.0–96.0)
MPV: 10.3 fL (ref 8.9–12.5)
Monocytes Absolute Automated: 0.5 10*3/uL (ref 0.21–0.85)
Monocytes: 10.1 %
Neutrophils Absolute: 1.73 10*3/uL (ref 1.10–6.33)
Neutrophils: 34.9 %
Nucleated RBC: 0 /100 WBC (ref 0.0–0.0)
Platelets: 352 10*3/uL — ABNORMAL HIGH (ref 142–346)
RBC: 4.44 10*6/uL (ref 3.90–5.10)
RDW: 14 % (ref 11–15)
WBC: 4.96 10*3/uL (ref 3.10–9.50)

## 2019-07-02 LAB — COMPREHENSIVE METABOLIC PANEL
ALT: 23 U/L (ref 0–55)
AST (SGOT): 23 U/L (ref 5–34)
Albumin/Globulin Ratio: 1.4 (ref 0.9–2.2)
Albumin: 4.1 g/dL (ref 3.5–5.0)
Alkaline Phosphatase: 87 U/L (ref 37–106)
Anion Gap: 10 (ref 5.0–15.0)
BUN: 18 mg/dL (ref 7.0–19.0)
Bilirubin, Total: 0.3 mg/dL (ref 0.2–1.2)
CO2: 23 mEq/L (ref 21–29)
Calcium: 8.9 mg/dL (ref 8.5–10.5)
Chloride: 108 mEq/L (ref 100–111)
Creatinine: 0.7 mg/dL (ref 0.4–1.5)
Globulin: 3 g/dL (ref 2.0–3.7)
Glucose: 94 mg/dL (ref 70–100)
Potassium: 3.8 mEq/L (ref 3.5–5.1)
Protein, Total: 7.1 g/dL (ref 6.0–8.3)
Sodium: 141 mEq/L (ref 136–145)

## 2019-07-02 LAB — HEMOGLOBIN A1C
Average Estimated Glucose: 119.8 mg/dL
Hemoglobin A1C: 5.8 % (ref 4.6–5.9)

## 2019-07-02 LAB — URINALYSIS
Bilirubin, UA: NEGATIVE
Blood, UA: NEGATIVE
Glucose, UA: NEGATIVE
Ketones UA: NEGATIVE
Leukocyte Esterase, UA: NEGATIVE
Nitrite, UA: NEGATIVE
Protein, UR: NEGATIVE
Specific Gravity UA: 1.016 (ref 1.001–1.035)
Urine pH: 7 (ref 5.0–8.0)
Urobilinogen, UA: 0.2 (ref 0.2–2.0)

## 2019-07-02 LAB — LIPID PANEL
Cholesterol / HDL Ratio: 3.4
Cholesterol: 166 mg/dL (ref 0–199)
HDL: 49 mg/dL (ref 40–9999)
LDL Calculated: 105 mg/dL — ABNORMAL HIGH (ref 0–99)
Triglycerides: 62 mg/dL (ref 34–149)
VLDL Calculated: 12 mg/dL (ref 10–40)

## 2019-07-02 LAB — GFR: EGFR: >60

## 2019-07-02 LAB — VITAMIN D,25 OH,TOTAL: Vitamin D, 25 OH, Total: 24 ng/mL — ABNORMAL LOW (ref 30–100)

## 2019-07-02 LAB — HEMOLYSIS INDEX: Hemolysis Index: 8 (ref 0–18)

## 2019-07-02 LAB — TSH: TSH: 1.33 u[IU]/mL (ref 0.35–4.94)

## 2019-07-02 LAB — T4, FREE: T4 Free: 0.88 ng/dL (ref 0.70–1.48)

## 2019-07-02 MED ORDER — METRONIDAZOLE 0.75 % VA GEL
Freq: Every evening | VAGINAL | 0 refills | Status: AC
Start: 2019-07-02 — End: 2019-07-07

## 2019-07-02 MED ORDER — FLUCONAZOLE 150 MG PO TABS
ORAL_TABLET | ORAL | 1 refills | Status: AC
Start: 2019-07-02 — End: 2019-07-08

## 2019-07-02 MED ORDER — VALSARTAN-HYDROCHLOROTHIAZIDE 80-12.5 MG PO TABS
1.0000 | ORAL_TABLET | Freq: Every day | ORAL | 5 refills | Status: DC
Start: 2019-07-02 — End: 2019-10-14

## 2019-07-02 NOTE — Progress Notes (Signed)
Annual physical examination    Date Time: 9:26 AM  Patient Name: Wendy [name],Wendy [name]  Attending Physician: Dr Salina April       CC; annual physical examination                 History of Present Illness:   Wendy [name] is a 64 y.o. female who presentsto my office with above.   she  Says she is in good Health,  she  Exercises moderately ,she eats moderately a healthy diet    Body mass index is 31.61 kg/m.    She lives in [place] and comes to [place] every now and then      Hypertension: occasionally compliant  With lisinopril-HCT  10-12.5   - The patient feels this issue is not controlled, now with dry cough   - The patient has been monitoring blood pressure.  - The patient has  been adhering to the recommended no-added-salt diet.  - The patient has not been compliant to the recommended exercise program.  - The patient denies orthostasis.  - The patient denies chest pain.  - The patient denies dyspnea.  - The patient denies edema.      Hx HLD  Compliant with cholesterol medication  No Myalgias,  Muscle aches , bodyaches  Compliant with low saturated and low carbohydrate Diet  No hx abnormal liver test related to statin therapy       Hx migraine , seeing neurology at [facility] , stable on diamox, pamelor prn     C/o chronic vaginal itch, SA with husband , Hx yeast       The following portions of the patient's history were reviewed and updated as appropriate: allergies, current medications, past family history, past medical history, past social history and problem list.  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/30/2013    Refractive error 06/30/2013    Blepharitis of both eyes 06/02/2013    Dry eyes, 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/2013    Fuchs' corneal dystrophy 01/27/2013    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    Pseudophakia of left eye 01/27/2013    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     Past Surgical History:     Past Surgical History:   Procedure Laterality Date    BRAIN SURGERY  2009    BREAST BIOPSY Right 2008    right breast biopsy- benign    BREAST BIOPSY Right 1970s    right breast tumor removed 40+ years ago - benign    BREAST CYST EXCISION  1970s    right breast tumor removed 40+ years ago - benign    CESAREAN SECTION      CORNEAL TRANSPLANT      EYE SURGERY      TUBAL LIGATION         Medications:     Current Outpatient Medications   Medication Sig Dispense Refill  acetaZOLAMIDE (DIAMOX) 125 MG tablet Take 2 tablets (250 mg total) by mouth as needed (Intracranial pressure) 90 tablet 1    co-enzyme Q-10 50 MG capsule Take 50 mg by mouth daily.      difluprednate (DUREZOL) 0.05 % Emulsion ophthalmic solution Place 1 drop into the left eye daily.       fluticasone (FLONASE) 50 MCG/ACT nasal spray       lisinopril-hydroCHLOROthiazide (ZESTORETIC) 10-12.5 MG per tablet Take 1 tablet by mouth daily 90 tablet 1    Multiple Vitamin (MULTIVITAMIN) tablet Take 1 tablet by mouth daily.      nortriptyline (PAMELOR) 10 MG capsule as needed         nortriptyline (PAMELOR) 10 MG capsule Take 10 mg by mouth      rosuvastatin (CRESTOR) 40 MG tablet 1 tab qod 90 tablet 1    triamcinolone (KENALOG) 0.025 % ointment Apply topically 2 (two) times daily (Patient taking differently: Apply topically as needed   ) 80 g 1    vitamin D, ergocalciferol, (DRISDOL) 50000 UNIT Cap Take 1 capsule (50,000 Units total) by mouth once a week 12 capsule 3     No current facility-administered medications for this visit.        Allergies:     Allergies    Allergen Reactions    Iodine Shortness Of Breath    Penicillins Shortness Of Breath     Has patient had a PCN reaction causing immediate rash, facial/tongue/throat swelling, SOB or lightheadedness with hypotension: Yes  Has patient had a PCN reaction causing severe rash involving mucus membranes or skin necrosis: No  Has patient had a PCN reaction that required hospitalization: No  Has patient had a PCN reaction occurring within the last 10 years: Yes  If all of the above answers are "NO", then may proceed with Cephalosporin use.    Rosuvastatin Shortness Of Breath     Throat swelling after Lisinopril was added, previous P[CP Stopped Crestor and Pt has been doing fine   Throat swelling after Lisinopril was added, previous P[CP Stopped Crestor and Pt has been doing fine   Throat swelling after Lisinopril was added, previous P[CP Stopped Crestor and Pt has been doing fine     Shellfish-Derived Products Anaphylaxis    Atorvastatin      Joint pain     Tetanus Immune Globulin      Arm swelling     Zocor [Simvastatin]      No response        Social History:     Social History     Tobacco Use    Smoking status: Never Smoker    Smokeless tobacco: Never Used   Substance Use Topics    Alcohol use: Yes     Alcohol/week: 1.0 - 2.0 standard drinks     Types: 1 - 2 Shots of liquor per week     Frequency: Monthly or less     Binge frequency: Monthly      Family History:     Family History   Problem Relation Age of Onset    Diabetes Mother     Hypertension Mother     Diabetes Father     Hypertension Father     Breast cancer Neg Hx          Review of Systems:   Review of Systems - History obtained from the patient  General ROS: negative for - chills, fatigue, fever, malaise, night sweats, sleep disturbance, weight gain  or weight loss  Psychological ROS: negative for - anxiety, concentration difficulties, depression, mood swings or sleep disturbances  Ophthalmic ROS: negative for - blurry vision, decreased vision,  double vision, uses contacts or uses glasses  ENT ROS: negative for - hearing change, vertigo, visual changes or vocal changes  Allergy and Immunology ROS: negative for - seasonal allergies  Hematological and Lymphatic ROS: negative for - bleeding problems, bruising, fatigue, night sweats, swollen lymph nodes or weight loss  Breast ROS: negative for breast lumps  Respiratory ROS: + cough, no shortness of breath, or wheezing  negative for - hemoptysis  Cardiovascular ROS: no chest pain or dyspnea on exertion  negative for - palpitations or paroxysmal nocturnal dyspnea  Gastrointestinal ROS: no abdominal pain, change in bowel habits, or black or bloody stools  Genito-Urinary ROS: no dysuria, trouble voiding, or hematuria  negative for - hematuria, urinary frequency/urgency , +vaginal itch   Musculoskeletal ROS: negative for - gait disturbance, joint pain, joint stiffness, joint swelling, muscle pain or muscular weakness  Neurological ROS: negative for - dizziness, gait disturbance, headaches, numbness/tingling or visual changes  Dermatological ROS: negative for lumps, mole changes and skin lesion changes  Other ROS were reviewed and were neg       Physical Examination:   Reviewed vitals as below  Vitals:    07/02/19 0845   BP: 150/89   Pulse:    Resp:    Temp:    SpO2:      General appearance - alert, well appearing, and in no distress and oriented to person, place, and time  Mental status - alert, oriented to person, place, and time, normal mood, behavior, speech, dress, motor activity, and thought processes  Eyes - pupils equal and reactive, extraocular eye movements intact  Ears - bilateral TM's and external ear canals normal  Nose - normal and patent, no erythema, discharge or polyps  Mouth - mucous membranes moist, pharynx normal without lesions, tonsils normal, tongue normal and TMJ exam normal, no tenderness, normal excursion  Neck - supple, no significant adenopathy, carotids upstroke normal bilaterally, no  bruits and thyroid exam: thyroid is normal in size without nodules or tenderness  Chest - clear to auscultation, no wheezes, rales or rhonchi, symmetric air entry  Heart - normal rate, regular rhythm, normal S1, S2, no murmurs, rubs, clicks or gallops  Abdomen - soft, nontender, nondistended, no masses or organomegaly  No hepatosplenomegaly or organomegaly  Back exam - full range of motion, no tenderness, palpable spasm or pain on motion  Neurological - alert, oriented, normal speech, no focal findings or movement disorder noted, neck supple without rigidity, cranial nerves II through XII intact, DTR's normal and symmetric, motor and sensory grossly normal bilaterally  Musculoskeletal - no joint tenderness, deformity or swelling, no muscular tenderness noted  Extremities - dorsalis pedis  pulses normal, no pedal edema, no clubbing or cyanosis    Genitourinary:   External: Normal female external genitalia, no lesions  BUS: within normal limits.  Vagina: diffuse redness , maceration and fissures       Assessment and plan:  1. Annual physical exam  CBC and differential    Hemoglobin A1C    Comprehensive metabolic panel    Lipid panel    TSH    T4, free    Urinalysis    Vitamin D,25 OH, Total   2. Low vitamin D level  CBC and differential    Hemoglobin A1C    Comprehensive metabolic panel  Lipid panel    TSH    T4, free    Urinalysis    Vitamin D,25 OH, Total   3. Mixed hyperlipidemia  CBC and differential    Hemoglobin A1C    Comprehensive metabolic panel    Lipid panel    TSH    T4, free    Urinalysis    Vitamin D,25 OH, Total   4. Essential hypertension  CBC and differential    Hemoglobin A1C    Comprehensive metabolic panel    Lipid panel    TSH    T4, free    Urinalysis    Vitamin D,25 OH, Total    valsartan-hydroCHLOROthiazide (DIOVAN-HCT) 80-12.5 MG per tablet   5. Intracranial pressure increased  CBC and differential    Hemoglobin A1C    Comprehensive metabolic panel    Lipid panel    TSH    T4, free     Urinalysis    Vitamin D,25 OH, Total   6. Blood sugar increased  Hemoglobin A1C   7. Screening for osteoporosis  Dxa Bone Density Axial Skeleton   8. Chronic vaginitis  fluconazole (DIFLUCAN) 150 MG tablet    metroNIDAZOLE (METROGEL VAGINAL) 0.75 % vaginal gel     Check labs   Change Lisinopril -HCT to diovan-HCT due to dry cough   Start diflucan and see gyn if no better     Annual physical  See me once yearly for annual preventative visit.  This is different and separate from any problem-related visit.    Be compliant with medications  Increase vegetables, fruits, and fiber in your diet.  Exercise at least 30 minutes 5 days per week.   Always wear your seatbelt in the car.  Wear sunscreen that is broad-spectrum (UVA/UVB) and at least SPF 30.  Avoid alcohol  drinking  And driving  Goal Blood Pressure <140/90  Schedule dental exam and cleaning every 6 months  Have your vision checked every 1-2 years.    HIgh BMI Follow Up   BMI Follow Up Care Plan Documented   Encouragement to Exercise             Risk & Benefits of the medication(s) were explained to the patient who verbalized understanding & agreed to the treatment plan      The best website for medical information is called UpToDate.  It's free for patients.  SeekStrategy.tn  The [facility] website also has good information.  [email or web address]      Phylliss Bob MD   [facility]

## 2019-07-02 NOTE — Progress Notes (Signed)
Have you seen any specialists/other providers since your last visit with us?    No    Arm preference verified?   Yes    The patient is due for shingles vaccine and PCMH, Advanced directive

## 2019-07-03 ENCOUNTER — Encounter (INDEPENDENT_AMBULATORY_CARE_PROVIDER_SITE_OTHER): Payer: Self-pay | Admitting: Student in an Organized Health Care Education/Training Program

## 2019-07-14 ENCOUNTER — Telehealth (INDEPENDENT_AMBULATORY_CARE_PROVIDER_SITE_OTHER): Payer: Self-pay | Admitting: Internal Medicine

## 2019-07-14 NOTE — Telephone Encounter (Signed)
Pt calls requesting for her Dxa Bone Density Order to be faxed to [facility]. Pt unable to schedule apt until they receive the order.     [facility]   [phone])

## 2019-07-14 NOTE — Telephone Encounter (Signed)
Done

## 2019-07-16 ENCOUNTER — Encounter (INDEPENDENT_AMBULATORY_CARE_PROVIDER_SITE_OTHER): Payer: Self-pay | Admitting: Student in an Organized Health Care Education/Training Program

## 2019-07-18 ENCOUNTER — Encounter (INDEPENDENT_AMBULATORY_CARE_PROVIDER_SITE_OTHER): Payer: Self-pay | Admitting: Family Medicine

## 2019-07-29 ENCOUNTER — Encounter (INDEPENDENT_AMBULATORY_CARE_PROVIDER_SITE_OTHER): Payer: Self-pay | Admitting: Student in an Organized Health Care Education/Training Program

## 2019-07-30 ENCOUNTER — Other Ambulatory Visit (INDEPENDENT_AMBULATORY_CARE_PROVIDER_SITE_OTHER): Payer: Self-pay | Admitting: Student in an Organized Health Care Education/Training Program

## 2019-07-30 ENCOUNTER — Encounter (INDEPENDENT_AMBULATORY_CARE_PROVIDER_SITE_OTHER): Payer: Self-pay | Admitting: Family Medicine

## 2019-07-30 DIAGNOSIS — I10 Essential (primary) hypertension: Secondary | ICD-10-CM

## 2019-07-30 MED ORDER — LISINOPRIL-HYDROCHLOROTHIAZIDE 10-12.5 MG PO TABS
1.0000 | ORAL_TABLET | Freq: Every day | ORAL | 1 refills | Status: DC
Start: 2019-07-30 — End: 2019-07-30

## 2019-07-30 MED ORDER — LISINOPRIL-HYDROCHLOROTHIAZIDE 10-12.5 MG PO TABS
1.0000 | ORAL_TABLET | Freq: Every day | ORAL | 1 refills | Status: DC
Start: 2019-07-30 — End: 2019-10-14

## 2019-08-01 ENCOUNTER — Ambulatory Visit (INDEPENDENT_AMBULATORY_CARE_PROVIDER_SITE_OTHER): Payer: No Typology Code available for payment source | Admitting: Family Medicine

## 2019-08-20 ENCOUNTER — Other Ambulatory Visit (INDEPENDENT_AMBULATORY_CARE_PROVIDER_SITE_OTHER): Payer: Self-pay | Admitting: Internal Medicine

## 2019-09-29 ENCOUNTER — Encounter (INDEPENDENT_AMBULATORY_CARE_PROVIDER_SITE_OTHER): Payer: Self-pay | Admitting: Family Medicine

## 2019-10-07 ENCOUNTER — Telehealth (INDEPENDENT_AMBULATORY_CARE_PROVIDER_SITE_OTHER): Payer: Self-pay | Admitting: Internal Medicine

## 2019-10-07 NOTE — Telephone Encounter (Signed)
Called patient and left her a voicemail to give [place] a call back, on the appointment notes it says she needs bloodwork for her surgery on her knee. If indeed she is having surgery needs to be changed to pre op and changed to 30 minute time slot.

## 2019-10-07 NOTE — Telephone Encounter (Signed)
Pt said that it is not confirmed yet that she is having surgery on her knee since she still has to meet the surgeon this Thursday. Pt said if the office has further questions, Renae Fickle can send her a [employer].       Thanks

## 2019-10-14 ENCOUNTER — Encounter (INDEPENDENT_AMBULATORY_CARE_PROVIDER_SITE_OTHER): Payer: Self-pay | Admitting: Internal Medicine

## 2019-10-14 ENCOUNTER — Ambulatory Visit (INDEPENDENT_AMBULATORY_CARE_PROVIDER_SITE_OTHER): Payer: No Typology Code available for payment source | Admitting: Internal Medicine

## 2019-10-14 VITALS — BP 131/75 | HR 74 | Temp 97.3°F | Ht 63.75 in | Wt 177.8 lb

## 2019-10-14 DIAGNOSIS — I10 Essential (primary) hypertension: Secondary | ICD-10-CM

## 2019-10-14 MED ORDER — LISINOPRIL-HYDROCHLOROTHIAZIDE 20-12.5 MG PO TABS
1.0000 | ORAL_TABLET | Freq: Every day | ORAL | 1 refills | Status: DC
Start: 2019-10-14 — End: 2019-10-24

## 2019-10-14 NOTE — Progress Notes (Signed)
Chief Complaint   Patient presents with    Hypertension     History of Chronic Hypertension  .The patient is compliant with anti-hypertensive therapy and denies side effects to therapy.  Pt denies CP, SOB, dizziness, orthopnea, PND or edema.  shechecks Blood pressure at home and is labile 120-140/80s she feels its generic med   calibratted home machine 150/80  Office BP twice 136/80   Is tolerating meds without side effects.   Patient  is compliant with Medications  Increased zestoretic brand only       Past Medical History:   Diagnosis Date    Brain tumor (benign)     Hypercholesteremia     Hypertensive disorder       Past Surgical History:   Procedure Laterality Date    BRAIN SURGERY  2009    BREAST BIOPSY Right 2008    right breast biopsy- benign    BREAST BIOPSY Right 1970s    right breast tumor removed 40+ years ago - benign    BREAST CYST EXCISION  1970s    right breast tumor removed 40+ years ago - benign    CESAREAN SECTION      CORNEAL TRANSPLANT      EYE SURGERY      TUBAL LIGATION        (Not in a hospital admission)    Current/Home Medications    ACETAZOLAMIDE (DIAMOX) 125 MG TABLET    Take 2 tablets (250 mg total) by mouth as needed (Intracranial pressure)    CO-ENZYME Q-10 50 MG CAPSULE    Take 50 mg by mouth daily.    FLUTICASONE (FLONASE) 50 MCG/ACT NASAL SPRAY    by Nasal route as needed       MULTIPLE VITAMIN (MULTIVITAMIN) TABLET    Take 1 tablet by mouth daily.    NORTRIPTYLINE (PAMELOR) 10 MG CAPSULE    Take 10 mg by mouth as needed    PREDNISOLONE ACETATE (PRED FORTE) 1 % OPHTHALMIC SUSPENSION    Apply 1 drop to eye daily    ROSUVASTATIN (CRESTOR) 40 MG TABLET    1 tab qod    TRIAMCINOLONE (KENALOG) 0.025 % OINTMENT    Apply topically 2 (two) times daily     Allergies   Allergen Reactions    Iodine Shortness Of Breath    Penicillins Shortness Of Breath     Has patient had a PCN reaction causing immediate rash, facial/tongue/throat swelling, SOB or lightheadedness with hypotension:  Yes  Has patient had a PCN reaction causing severe rash involving mucus membranes or skin necrosis: No  Has patient had a PCN reaction that required hospitalization: No  Has patient had a PCN reaction occurring within the last 10 years: Yes  If all of the above answers are "NO", then may proceed with Cephalosporin use.    Shellfish-Derived Products Anaphylaxis    Atorvastatin      Joint pain     Morphine      Itchy     Tetanus Immune Globulin      Arm swelling     Zocor [Simvastatin]      No response       Social History     Tobacco Use    Smoking status: Never Smoker    Smokeless tobacco: Never Used   Substance Use Topics    Alcohol use: Yes     Alcohol/week: 1.0 - 2.0 standard drinks     Types: 1 - 2 Shots of liquor per  week      Family History   Problem Relation Age of Onset    Diabetes Mother     Hypertension Mother     Diabetes Father     Hypertension Father     Breast cancer Neg Hx         Review of Systems   Constitutional: Negative for chills and fever.   Respiratory: Negative for cough and shortness of breath.    Cardiovascular: Negative for chest pain, palpitations and leg swelling.   Neurological: Negative for dizziness, sensory change, speech change, focal weakness and headaches.             BP 131/75 (BP Site: Left arm, Patient Position: Sitting, Cuff Size: Large)    Pulse 74    Temp 97.3 F (36.3 C)    Ht 1.619 m (5' 3.75")    Wt 80.6 kg (177 lb 12.8 oz)    SpO2 99%    BMI 30.76 kg/m   Wt Readings from Last 3 Encounters:   10/14/19 80.6 kg (177 lb 12.8 oz)   07/02/19 79.7 kg (175 lb 9.6 oz)   11/06/18 76.7 kg (169 lb)           Mental status - alert, oriented to person, place, and time.   Neck - supple, no cervical  Adenopathy,No carotid bruit  Chest - clear to auscultation, no wheezes, rales or rhonchi, symmetric air entry  Heart - normal rate, regular rhythm, normal S1, S2, no murmurs, rubs, clicks or gallops  Neurological - alert, oriented, normal speech, no focal findings    Extremities - dorsalis pedis  pulses normal, no pedal edema,             Assessment/Plan:      1. Essential hypertension    - lisinopril-hydroCHLOROthiazide (Zestoretic) 20-12.5 MG per tablet; Take 1 tablet by mouth daily  Dispense: 90 tablet; Refill: 1    Body mass index is 30.76 kg/m.        Dr Windell Moment MD  Internist  [facility] [place]  [address], [place],  [id number]  [phone]  [phone]

## 2019-10-14 NOTE — Patient Instructions (Signed)
HTN:  Systolic blood pressure is above goal. Continue to optimize low salt diet and aerobic exercise efforts. and increasing zestoretic daily  Recommend out-of-office blood pressure measurements for titration of BP-lowering medication.      Advised to monitor at home   Goal <130/80  Check BP  in the morning  wait for an hou after you wake up, use restroom    Advised to check with out eating exercise and caffeine, check 3 readings if possible 5 minutes apart and document for a week or 2 an dreport  Advised dash diet     Low-Salt Choices  Eating salt (sodium) can make your body retain too much water. Extra water makes your heart work harder. Canned, packaged, and frozen foods are easy to prepare. But they are often high in sodium. Here are some ideas for low-salt foods you can easily make yourself.     For breakfast   Fruit or 100% fruit juice. It's better to have whole fruit instead of 100% fruit juice.   Whole-wheat bread or an English muffin. Look for sodium content on Nutrition Facts labels.   Low-fat milk or yogurt   Unsalted eggs   Shredded wheat   Corn tortillas   Unsalted steamed rice   Regular (not instant) hot cereal, made without salt  Stay away from   Sausage, bacon, and ham   Flour tortillas   Packaged muffins, pancakes, and biscuits   Instant hot cereals   Cottage cheese    For lunch and dinner   Fresh fish, chicken, [place], or meat--baked, broiled, or roasted without salt   Dry beans, cooked without salt   Tofu, stir-fried without salt   Unsalted fresh fruit and vegetables, or frozen or canned fruit and vegetables with no added salt  Stay away from   Lunch or deli meat that is cured or smoked   Cheese   Tomato juice and ketchup   Canned vegetables, soups, and fish not labeled as no-salt-added or reduced sodium   Packaged gravies and sauces   Olives, pickles, and relish   Bottled salad dressings    For snacks and desserts   Yogurt   Unsalted, air-popped popcorn   Unsalted nuts  or seeds  Stay away from   Pies and cakes   Packaged dessert mixes   Pizza   Canned and packaged puddings   Pretzels, chips, crackers, and nuts--unless the label says unsalted    StayWell last reviewed this educational content on 02/22/2018   2000-2020 [employer], [place]. [address], [place], [place] [address]. All rights reserved. This information is not intended as a substitute for professional medical care. Always follow your healthcare professional's instructions.        Check your blood pressure at home or at the drug store on a regular basis.  Goal is under 140/90.  Call or Email on Mychart about the resullts or Schedule appointment  I counseled her on her elevated BP levels and counseled her that a normal BP is 120/80 and that high blood pressure is considered when there is persistent elevation of BP > 140/90. I outlined the risks of chronically elevated BP in the long term to include higher risk of CVA and MI. I discussed treatment options to include salt avoidance, weight loss, regular aerobic exercise, avoidance of heavy use of alcohol, tobacco cessation and medication treatment if appropriate. At this time I recommended   Increase vegetables, fruits, and fiber in your diet.  Decrease sodium in  your diet (less than 2000mg  daily).  Decrease eating out.  Limit caffeine and alcohol intake.    Exercise at least 30 minutes 5 days per week.  Work on decreasing stress in your life in whatever way makes sense for you.

## 2019-10-14 NOTE — Progress Notes (Signed)
Have you seen any specialists/other providers since your last visit with us?    Yes    Arm preference verified?   Yes    The patient is due for shingles vaccine and AD and PCMH letter.

## 2019-10-23 ENCOUNTER — Telehealth: Payer: Self-pay | Admitting: Internal Medicine

## 2019-10-23 ENCOUNTER — Other Ambulatory Visit: Payer: Self-pay

## 2019-10-23 ENCOUNTER — Ambulatory Visit
Admission: EM | Admit: 2019-10-23 | Discharge: 2019-10-23 | Disposition: A | Discharge status: Home / Self Care | Payer: Medicare Other | Attending: Physician Assistant | Admitting: Physician Assistant

## 2019-10-23 ENCOUNTER — Encounter: Payer: Self-pay | Admitting: Emergency Medicine

## 2019-10-23 DIAGNOSIS — R0789 Other chest pain: Secondary | ICD-10-CM

## 2019-10-23 NOTE — Telephone Encounter (Signed)
Called and spoke with pt. Pt states her preferred pharmacy, [facility] are both not selling lisinopril-hctz (Zestoretic). Pt states generic does not work for her so she needs the brand, however, since pharmacies don't have the brand rx, pt wants to know if there is another alternative pt can take instead

## 2019-10-23 NOTE — Telephone Encounter (Signed)
Patient called med was order brand name is not available (lisinopril-hydroCHLOROthiazide (Zestoretic) 20-12.5 MG per tablet (Order [id number])         Patient called like to know what is the next step she can do even check different pharmacy didn't have availability please advise and let her know Thanks.      Patient Preferred Callback Number:[phone]

## 2019-10-23 NOTE — Telephone Encounter (Signed)
We can split it into brand lisinopril and brand HCTZ or completely changeto different med  Let us know

## 2019-10-23 NOTE — ED Triage Notes (Signed)
6/29 had pain.  Yesterday morning chest pain woke her up, sharp pain.  Patient had chest pain last night and diaphoresis.  Patient has been taking pepcid.    Right now not having any pain.  Pain is intermittent.  No nausea, no vomiting, no sob

## 2019-10-23 NOTE — ED Provider Notes (Signed)
[facility]    CSN: [id number] Arrival date & time: 10/23/19  1434      History   Chief Complaint Chief Complaint  Patient presents with  . Chest Pain    HPI Annette [place] is a 64 y.o. female.   64 year old female comes in for 2-day history of chest pain.  No current pain.  States pain occurs at nighttime, and mostly to the substernal area.  Pain is sharp in nature lasts less than a minute without any radiation.  No associated nausea, vomiting, shortness of breath.  During one episode, had some swelling, but feels that this was due to being hot.  Denies feeling cold and clammy.  Denies burning sensation, abdominal pain.  Denies URI symptoms.  Denies symptoms during eating.  Denies exertional chest pain, exertional fatigue, dyspnea on exertion.  Denies history of heart disease.   States had similar symptoms once, and was found to be due to acid reflux. Did recently have food she feels that can trigger GERD. She took pepcid once after having symptoms last night.      Past Medical History:  Diagnosis Date  . High cholesterol   . Hypertension     There are no problems to display for this patient.   Past Surgical History:  Procedure Laterality Date  . BRAIN SURGERY    . BREAST EXCISIONAL BIOPSY Right    benign  . EYE SURGERY      OB History   No obstetric history on file.      Home Medications    Prior to Admission medications   Medication Sig Start Date End Date Taking? Authorizing Provider  co-enzyme Q-10 30 MG capsule Take 30 mg by mouth every other day.   Yes [provider]  Iron-Vitamins (GERITOL COMPLETE PO) Take 1 tablet by mouth every other day.   Yes [provider]  lisinopril-hydrochlorothiazide (PRINZIDE,ZESTORETIC) 10-12.5 MG tablet Take 1 tablet by mouth daily.   Yes [provider]  rosuvastatin (CRESTOR) 40 MG tablet Take 40 mg by mouth at bedtime. 10/22/17  Yes [provider]  Vitamin D,  Ergocalciferol, (DRISDOL) 50000 units CAPS capsule Take 50,000 Units by mouth every 7 (seven) days.   Yes [provider]  acetaZOLAMIDE (DIAMOX) 125 MG tablet Take 125 mg by mouth at bedtime as needed.     [provider]  estradiol (ESTRACE) 0.1 MG/GM vaginal cream Place 1 Applicatorful vaginally at bedtime. Use 3 times weekly x 2 weeks, then taper as needed 04/08/19   Hall-Potvin, [place], PA-C  ipratropium (ATROVENT) 0.06 % nasal spray Place 2 sprays into both nostrils 4 (four) times daily. Patient not taking: Reported on 02/11/2018 07/16/17   Belinda Fisher, PA-C  nortriptyline (PAMELOR) 10 MG capsule Take 10 mg by mouth at bedtime as needed.     [provider]    Family History Family History  Problem Relation Age of Onset  . Hypertension Mother   . Diabetes Mother   . Diabetes Father     Social History Social History   Tobacco Use  . Smoking status: Never Smoker  . Smokeless tobacco: Never Used  Substance Use Topics  . Alcohol use: No  . Drug use: Never     Allergies   Atorvastatin, Iodine, Penicillins, Simvastatin, Morphine and related, Shellfish allergy, and Tetanus toxoids   Review of Systems Review of Systems  Reason unable to perform ROS: See HPI as above.     Physical Exam Triage Vital  Signs ED Triage Vitals  Enc Vitals Group     BP 10/23/19 1445 (!) 142/76     Pulse Rate 10/23/19 1445 78     Resp 10/23/19 1445 18     Temp 10/23/19 1445 98 F (36.7 C)     Temp Source 10/23/19 1445 Oral     SpO2 10/23/19 1445 98 %     Weight --      Height --      Head Circumference --      Peak Flow --      Pain Score 10/23/19 1442 0     Pain Loc --      Pain Edu? --      Excl. in GC? --    No data found.  Updated Vital Signs BP (!) 142/76 (BP Location: Left Arm)   Pulse 78   Temp 98 F (36.7 C) (Oral)   Resp 18   SpO2 98%   Physical Exam Constitutional:      General: She is not in acute distress.    Appearance: Normal  appearance. She is well-developed. She is not toxic-appearing or diaphoretic.  HENT:     Head: Normocephalic and atraumatic.  Eyes:     Conjunctiva/sclera: Conjunctivae normal.     Pupils: Pupils are equal, round, and reactive to light.  Cardiovascular:     Rate and Rhythm: Normal rate and regular rhythm.     Heart sounds: No murmur heard.  No friction rub. No gallop.   Pulmonary:     Effort: Pulmonary effort is normal. No respiratory distress.     Comments: Speaking in full sentences without difficulty. LCTAB Chest:     Comments: Left sided chest tenderness Abdominal:     Palpations: Abdomen is soft.     Tenderness: There is no abdominal tenderness. There is no guarding or rebound.  Musculoskeletal:     Cervical back: Normal range of motion and neck supple.     Right lower leg: No edema.     Left lower leg: No edema.  Skin:    General: Skin is warm and dry.  Neurological:     Mental Status: She is alert and oriented to person, place, and time.      UC Treatments / Results  Labs (all labs ordered are listed, but only abnormal results are displayed) Labs Reviewed - No data to display  EKG   Radiology No results found.  Procedures Procedures (including critical care time)  Medications Ordered in UC Medications - No data to display  Initial Impression / Assessment and Plan / UC Course  I have reviewed the triage vital signs and the nursing notes.  Pertinent labs & imaging results that were available during my care of the patient were reviewed by me and considered in my medical decision making (see chart for details).    Patient without current chest pain.  EKG normal sinus rhythm, 74 bpm, no obvious ST changes.  Unchanged from prior.  History and exam lower suspicion for ACS at this time.  Some reproducible pain on palpation.  Discussed to continue Pepcid for possible acid reflux.  Symptomatic treatment for MSK pain.  And continue to monitor symptoms.  Strict return  precautions given.  Otherwise to follow-up with PCP for further evaluation and management needed.  Patient expresses understanding and agrees to plan.  Final Clinical Impressions(s) / UC Diagnoses   Final diagnoses:  Chest wall pain  Atypical chest pain    ED Prescriptions  None     PDMP not reviewed this encounter.   Belinda Fisher, PA-C 10/23/19 1636

## 2019-10-23 NOTE — Discharge Instructions (Signed)
EKG normal. Can continue pepcid. Diclofenac gel to the chest if needed. If significant chest pain, shortness of breath, nausea/vomiting, go to the emergency department for further evaluation.

## 2019-10-24 ENCOUNTER — Other Ambulatory Visit (INDEPENDENT_AMBULATORY_CARE_PROVIDER_SITE_OTHER): Payer: Self-pay | Admitting: Internal Medicine

## 2019-10-24 DIAGNOSIS — I10 Essential (primary) hypertension: Secondary | ICD-10-CM

## 2019-10-24 MED ORDER — HYDROCHLOROTHIAZIDE 12.5 MG PO TABS
12.5000 mg | ORAL_TABLET | Freq: Every day | ORAL | 1 refills | Status: DC
Start: 2019-10-24 — End: 2020-01-15

## 2019-10-24 MED ORDER — LISINOPRIL 20 MG PO TABS
20.0000 mg | ORAL_TABLET | Freq: Every day | ORAL | 1 refills | Status: DC
Start: 2019-10-24 — End: 2020-01-15

## 2019-10-24 NOTE — Telephone Encounter (Signed)
I have called pt before lunch and she would like to first call some pharmacies nearby her house to find out if these pharmacies have bran lisinopril/brand hctz first before she decides on what to take. She stated she will call us back once she makes her decision. I am waiting for her call back.

## 2019-10-24 NOTE — Telephone Encounter (Signed)
Pt is returning a call that she received. I told her she will receive a call later today. Please advise

## 2019-10-24 NOTE — Telephone Encounter (Signed)
Called but no answer and vm was full so could not leave a vm. I will try calling at a later time.

## 2019-10-24 NOTE — Telephone Encounter (Signed)
Called an dSpoke to the patient   She agreed to split the combo lisinipril HCTZ  To lisinopril brand only separaretly and HCTZ brand only separately

## 2019-10-24 NOTE — Telephone Encounter (Signed)
Can send amlodipine 5mg  but not sure how her BP will be  Needs to make a visit 4 weeks after being on amlodipine

## 2019-10-24 NOTE — Telephone Encounter (Signed)
Pt called back and states pt wants to try something different that is not lisinopril brand nor HCTZ brand. Pt states pt cannot take valsartan because it's not effective, so pt would like dr. Recardo Evangelist advice on what alternative med to take.

## 2019-10-24 NOTE — Telephone Encounter (Signed)
Can you call her about the med split

## 2019-12-01 ENCOUNTER — Other Ambulatory Visit: Payer: Self-pay

## 2019-12-01 ENCOUNTER — Ambulatory Visit
Admission: EM | Admit: 2019-12-01 | Discharge: 2019-12-01 | Disposition: A | Discharge status: Home / Self Care | Payer: Medicare Other | Attending: Emergency Medicine | Admitting: Emergency Medicine

## 2019-12-01 DIAGNOSIS — K5900 Constipation, unspecified: Secondary | ICD-10-CM | POA: Diagnosis not present

## 2019-12-01 DIAGNOSIS — M545 Low back pain, unspecified: Secondary | ICD-10-CM

## 2019-12-01 LAB — POCT URINALYSIS DIP (MANUAL ENTRY)
Bilirubin, UA: NEGATIVE
Blood, UA: NEGATIVE
Glucose, UA: NEGATIVE mg/dL
Ketones, POC UA: NEGATIVE mg/dL
Leukocytes, UA: NEGATIVE
Nitrite, UA: NEGATIVE
Protein Ur, POC: NEGATIVE mg/dL
Spec Grav, UA: 1.02 (ref 1.010–1.025)
Urobilinogen, UA: 0.2 E.U./dL
pH, UA: 6 (ref 5.0–8.0)

## 2019-12-01 MED ORDER — NAPROXEN 500 MG PO TABS: 500.0000 mg | ORAL_TABLET | Freq: Two times a day (BID) | ORAL | 0 refills | Status: AC

## 2019-12-01 MED ORDER — TIZANIDINE HCL 2 MG PO TABS: 2.0000 mg | ORAL_TABLET | Freq: Three times a day (TID) | ORAL | 0 refills | Status: AC | PRN

## 2019-12-01 NOTE — ED Provider Notes (Signed)
[facility]    CSN: [id number] Arrival date & time: 12/01/19  1337      History   Chief Complaint Chief Complaint  Patient presents with  . Flank Pain    HPI Annette [place] is a 64 y.o. female with history of hypertension presenting right-sided back pain x1 week.  Patient denies change in urination, burning, frequency, urgency, hematuria.  No vaginal discharge, abdominal pain, nausea, vomiting, fever.  States that she went to an amusement park a few days prior to this: When a lot of rides where she felt twisting, pulling, bumping.  Has not tried thing for this.    Past Medical History:  Diagnosis Date  . High cholesterol   . Hypertension     There are no problems to display for this patient.   Past Surgical History:  Procedure Laterality Date  . BRAIN SURGERY    . BREAST EXCISIONAL BIOPSY Right    benign  . EYE SURGERY      OB History   No obstetric history on file.      Home Medications    Prior to Admission medications   Medication Sig Start Date End Date Taking? Authorizing Provider  acetaZOLAMIDE (DIAMOX) 125 MG tablet Take 125 mg by mouth at bedtime as needed.     [provider]  co-enzyme Q-10 30 MG capsule Take 30 mg by mouth every other day.    [provider]  estradiol (ESTRACE) 0.1 MG/GM vaginal cream Place 1 Applicatorful vaginally at bedtime. Use 3 times weekly x 2 weeks, then taper as needed 04/08/19   Hall-Potvin, [place], PA-C  ipratropium (ATROVENT) 0.06 % nasal spray Place 2 sprays into both nostrils 4 (four) times daily. Patient not taking: Reported on 02/11/2018 07/16/17   Belinda Fisher, PA-C  Iron-Vitamins (GERITOL COMPLETE PO) Take 1 tablet by mouth every other day.    [provider]  lisinopril-hydrochlorothiazide (PRINZIDE,ZESTORETIC) 10-12.5 MG tablet Take 1 tablet by mouth daily.    [provider]  naproxen (NAPROSYN) 500 MG tablet Take 1 tablet (500 mg total) by mouth 2 (two) times daily.  12/01/19   Hall-Potvin, [place], PA-C  nortriptyline (PAMELOR) 10 MG capsule Take 10 mg by mouth at bedtime as needed.     [provider]  rosuvastatin (CRESTOR) 40 MG tablet Take 40 mg by mouth at bedtime. 10/22/17   [provider]  tiZANidine (ZANAFLEX) 2 MG tablet Take 1 tablet (2 mg total) by mouth every 8 (eight) hours as needed for up to 5 days for muscle spasms. 12/01/19 12/06/19  Hall-Potvin, [place], PA-C  Vitamin D, Ergocalciferol, (DRISDOL) 50000 units CAPS capsule Take 50,000 Units by mouth every 7 (seven) days.    [provider]    Family History Family History  Problem Relation Age of Onset  . Hypertension Mother   . Diabetes Mother   . Diabetes Father     Social History Social History   Tobacco Use  . Smoking status: Never Smoker  . Smokeless tobacco: Never Used  Substance Use Topics  . Alcohol use: No  . Drug use: Never     Allergies   Atorvastatin, Iodine, Penicillins, Simvastatin, Morphine and related, Shellfish allergy, and Tetanus toxoids   Review of Systems As per HPI   Physical Exam Triage Vital Signs ED Triage Vitals  Enc Vitals Group     BP 12/01/19 1515 128/81     Pulse Rate 12/01/19 1515 73     Resp 12/01/19 1515  16     Temp 12/01/19 1515 98 F (36.7 C)     Temp Source 12/01/19 1515 Oral     SpO2 12/01/19 1515 99 %     Weight --      Height --      Head Circumference --      Peak Flow --      Pain Score 12/01/19 1526 0     Pain Loc --      Pain Edu? --      Excl. in GC? --    No data found.  Updated Vital Signs BP 128/81 (BP Location: Left Arm)   Pulse 73   Temp 98 F (36.7 C) (Oral)   Resp 16   SpO2 99%   Visual Acuity Right Eye Distance:   Left Eye Distance:   Bilateral Distance:    Right Eye Near:   Left Eye Near:    Bilateral Near:     Physical Exam Constitutional:      General: She is not in acute distress. HENT:     Head: Normocephalic and atraumatic.  Eyes:     General: No scleral  icterus.    Pupils: Pupils are equal, round, and reactive to light.  Cardiovascular:     Rate and Rhythm: Normal rate.  Pulmonary:     Effort: Pulmonary effort is normal.  Musculoskeletal:        General: Tenderness present. No swelling. Normal range of motion.     Comments: Mild right-sided back tenderness that spares spinous process, PSIS.  Negative SLR.  NVI  Skin:    Coloration: Skin is not jaundiced or pale.  Neurological:     Mental Status: She is alert and oriented to person, place, and time.      UC Treatments / Results  Labs (all labs ordered are listed, but only abnormal results are displayed) Labs Reviewed  POCT URINALYSIS DIP (MANUAL ENTRY) - Normal    EKG   Radiology No results found.  Procedures Procedures (including critical care time)  Medications Ordered in UC Medications - No data to display  Initial Impression / Assessment and Plan / UC Course  I have reviewed the triage vital signs and the nursing notes.  Pertinent labs & imaging results that were available during my care of the patient were reviewed by me and considered in my medical decision making (see chart for details).     Likely MSK given recent strain.  Will treat supportively as outlined below.  Urine dipstick unremarkable.  Return precautions discussed, pt verbalized understanding and is agreeable to plan. Final Clinical Impressions(s) / UC Diagnoses   Final diagnoses:  Constipation, unspecified constipation type  Acute right-sided low back pain without sciatica     Discharge Instructions     Keep a food & symptom log and follow up with PCP about constipation.  Heat therapy (hot compress, warm wash rag, hot showers, etc.) can help relax muscles and soothe muscle aches. Cold therapy (ice packs) can be used to help swelling both after injury and after prolonged use of areas of chronic pain/aches.  500 mg Naprosyn/Aleve (naproxen) every 12 hours with food:  AVOID other NSAIDs while  taking this (may have Tylenol).  May take muscle relaxer as needed for severe pain / spasm.  (This medication may cause you to become tired so it is important you do not drink alcohol or operate heavy machinery while on this medication.  Recommend your first dose to be taken before  bedtime to monitor for side effects safely)  Important to follow up with specialist(s) below for further evaluation/management if your symptoms persist or worsen.    ED Prescriptions    Medication Sig Dispense Auth. Provider   naproxen (NAPROSYN) 500 MG tablet Take 1 tablet (500 mg total) by mouth 2 (two) times daily. 30 tablet Hall-Potvin, [place], PA-C   tiZANidine (ZANAFLEX) 2 MG tablet Take 1 tablet (2 mg total) by mouth every 8 (eight) hours as needed for up to 5 days for muscle spasms. 15 tablet Hall-Potvin, [place], PA-C     I have reviewed the PDMP during this encounter.   Hall-Potvin, [place], [place] 12/01/19 1716

## 2019-12-01 NOTE — Discharge Instructions (Addendum)
Keep a food & symptom log and follow up with PCP about constipation.  Heat therapy (hot compress, warm wash rag, hot showers, etc.) can help relax muscles and soothe muscle aches. Cold therapy (ice packs) can be used to help swelling both after injury and after prolonged use of areas of chronic pain/aches.  500 mg Naprosyn/Aleve (naproxen) every 12 hours with food:  AVOID other NSAIDs while taking this (may have Tylenol).  May take muscle relaxer as needed for severe pain / spasm.  (This medication may cause you to become tired so it is important you do not drink alcohol or operate heavy machinery while on this medication.  Recommend your first dose to be taken before bedtime to monitor for side effects safely)  Important to follow up with specialist(s) below for further evaluation/management if your symptoms persist or worsen.

## 2019-12-01 NOTE — ED Triage Notes (Signed)
Pt c/o rt flank pain x1wk. Denies difficulty on urination but states her urine flow is different. Denies injury or heavy lifting.

## 2020-01-02 ENCOUNTER — Other Ambulatory Visit: Payer: Self-pay | Admitting: Orthopaedic Surgery

## 2020-01-02 ENCOUNTER — Ambulatory Visit (FREE_STANDING_LABORATORY_FACILITY): Payer: No Typology Code available for payment source | Admitting: Internal Medicine

## 2020-01-02 ENCOUNTER — Ambulatory Visit (INDEPENDENT_AMBULATORY_CARE_PROVIDER_SITE_OTHER): Payer: No Typology Code available for payment source | Admitting: Internal Medicine

## 2020-01-02 ENCOUNTER — Encounter (INDEPENDENT_AMBULATORY_CARE_PROVIDER_SITE_OTHER): Payer: Self-pay | Admitting: Internal Medicine

## 2020-01-02 ENCOUNTER — Ambulatory Visit
Admission: RE | Admit: 2020-01-02 | Discharge: 2020-01-02 | Disposition: A | Discharge status: Home / Self Care | Payer: No Typology Code available for payment source | Source: Ambulatory Visit | Attending: Internal Medicine | Admitting: Internal Medicine

## 2020-01-02 VITALS — BP 138/78 | HR 72 | Temp 98.2°F | Resp 19 | Ht 62.5 in | Wt 171.0 lb

## 2020-01-02 DIAGNOSIS — M1712 Unilateral primary osteoarthritis, left knee: Secondary | ICD-10-CM

## 2020-01-02 DIAGNOSIS — Z01818 Encounter for other preprocedural examination: Secondary | ICD-10-CM | POA: Insufficient documentation

## 2020-01-02 DIAGNOSIS — I10 Essential (primary) hypertension: Secondary | ICD-10-CM

## 2020-01-02 LAB — CBC AND DIFFERENTIAL
Absolute NRBC: 0 10*3/uL (ref 0.00–0.00)
Basophils Absolute Automated: 0.03 10*3/uL (ref 0.00–0.08)
Basophils Automated: 0.8 %
Eosinophils Absolute Automated: 0.15 10*3/uL (ref 0.00–0.44)
Eosinophils Automated: 3.9 %
Hematocrit: 40.6 % (ref 34.7–43.7)
Hgb: 12.9 g/dL (ref 11.4–14.8)
Immature Granulocytes Absolute: 0.01 10*3/uL (ref 0.00–0.07)
Immature Granulocytes: 0.3 %
Lymphocytes Absolute Automated: 1.82 10*3/uL (ref 0.42–3.22)
Lymphocytes Automated: 47.5 %
MCH: 28.9 pg (ref 25.1–33.5)
MCHC: 31.8 g/dL (ref 31.5–35.8)
MCV: 91 fL (ref 78.0–96.0)
MPV: 10 fL (ref 8.9–12.5)
Monocytes Absolute Automated: 0.45 10*3/uL (ref 0.21–0.85)
Monocytes: 11.7 %
Neutrophils Absolute: 1.37 10*3/uL (ref 1.10–6.33)
Neutrophils: 35.8 %
Nucleated RBC: 0 /100 WBC (ref 0.0–0.0)
Platelets: 394 10*3/uL — ABNORMAL HIGH (ref 142–346)
RBC: 4.46 10*6/uL (ref 3.90–5.10)
RDW: 14 % (ref 11–15)
WBC: 3.83 10*3/uL (ref 3.10–9.50)

## 2020-01-02 LAB — URINALYSIS REFLEX TO MICROSCOPIC EXAM - REFLEX TO CULTURE
Bilirubin, UA: NEGATIVE
Blood, UA: NEGATIVE
Glucose, UA: NEGATIVE
Ketones UA: NEGATIVE
Leukocyte Esterase, UA: NEGATIVE
Nitrite, UA: NEGATIVE
Protein, UR: NEGATIVE
Specific Gravity UA: 1.013 (ref 1.001–1.035)
Urine Bacteria: NONE SEEN /hpf
Urine pH: 7 (ref 5.0–8.0)
Urobilinogen, UA: 0.2 (ref 0.2–2.0)

## 2020-01-02 LAB — PT AND APTT
PT INR: 1 (ref 0.9–1.1)
PT: 11.5 s (ref 10.1–12.9)
PTT: 34 s (ref 27–39)

## 2020-01-02 NOTE — Progress Notes (Signed)
Have you seen any specialists/other providers since your last visit with us?    No    Arm preference verified?   Yes    The patient is due for shingles vaccine and PCMH< Advanced directive

## 2020-01-02 NOTE — Progress Notes (Signed)
Chief Complaint   Patient presents with    Pre-op Exam     L knee replacement 01/20/2020        .PREOPERATIVE EVALUATION AND CLEARANCE    Wendy [name] is a 65 y.o. female who presents to the office today for a preoperative consultation at the request of surgeon Dr Alfonse Spruce [phone]  who plans on performing Left TKA  Patient has a history of HTN      Planned anesthesia: regional, epidural and general.   The patient has no known anesthesia issues   Patient has no Hx  bleeding risk in the past    The surgeon has requested the following labs/studies to be performed:        Pt has no personal history of cardiac or pulmonary disease.  Pt has no personal or family history of bleeding or VTE.  Pt is able to climb a flight of stairs without stopping or having symptoms.          Patient Active Problem List   Diagnosis    Fuchs' corneal dystrophy    Status post corneal transplant    Pseudophakia of left eye    Nuclear sclerosis of right eye    Dry eyes, bilateral    Blepharitis of both eyes    post DSEK, OS    Refractive error    Corneal graft rejection    ERM OS (epiretinal membrane, left eye)    PVD (posterior vitreous detachment)    OC (onychocryptosis)    Acquired keratoderma    Sciatica associated with disorder of lumbosacral spine    Acute pain of left knee       Past Surgical History:   Procedure Laterality Date    BRAIN SURGERY  2009    BREAST BIOPSY Right 2008    right breast biopsy- benign    BREAST BIOPSY Right 1970s    right breast tumor removed 40+ years ago - benign    BREAST CYST EXCISION  1970s    right breast tumor removed 40+ years ago - benign    CESAREAN SECTION      CORNEAL TRANSPLANT      EYE SURGERY      TUBAL LIGATION         Current Outpatient Medications   Medication Sig Dispense Refill    acetaZOLAMIDE (DIAMOX) 125 MG tablet Take 2 tablets (250 mg total) by mouth as needed (Intracranial pressure) 90 tablet 1    co-enzyme Q-10 50 MG capsule Take 50 mg by mouth daily.       fluticasone (FLONASE) 50 MCG/ACT nasal spray by Nasal route as needed         hydroCHLOROthiazide (HYDRODIURIL) 12.5 MG tablet Take 1 tablet (12.5 mg total) by mouth daily 90 tablet 1    lisinopril (ZESTRIL) 20 MG tablet Take 1 tablet (20 mg total) by mouth daily 90 tablet 1    Multiple Vitamin (MULTIVITAMIN) tablet Take 1 tablet by mouth daily.      nortriptyline (PAMELOR) 10 MG capsule Take 10 mg by mouth as needed      prednisoLONE acetate (PRED FORTE) 1 % ophthalmic suspension 1 drop 4 (four) times daily      rosuvastatin (CRESTOR) 40 MG tablet 1 tab qod 90 tablet 1    triamcinolone (KENALOG) 0.025 % ointment Apply topically 2 (two) times daily (Patient taking differently: Apply topically as needed   ) 80 g 1    vitamin D, ergocalciferol, (DRISDOL) 50000 UNIT Cap  Take 50,000 Units by mouth once a week       No current facility-administered medications for this visit.       Allergies   Allergen Reactions    Iodine Shortness Of Breath    Penicillins Shortness Of Breath     Has patient had a PCN reaction causing immediate rash, facial/tongue/throat swelling, SOB or lightheadedness with hypotension: Yes  Has patient had a PCN reaction causing severe rash involving mucus membranes or skin necrosis: No  Has patient had a PCN reaction that required hospitalization: No  Has patient had a PCN reaction occurring within the last 10 years: Yes  If all of the above answers are "NO", then may proceed with Cephalosporin use.    Shellfish-Derived Products Anaphylaxis    Atorvastatin      Joint pain     Morphine      Itchy     Tetanus Immune Globulin      Arm swelling     Zocor [Simvastatin]      No response        Social History     Tobacco Use    Smoking status: Never Smoker    Smokeless tobacco: Never Used   Vaping Use    Vaping Use: Never used   Substance Use Topics    Alcohol use: Yes     Alcohol/week: 1.0 - 2.0 standard drinks     Types: 1 - 2 Shots of liquor per week    Drug use: No     Tobacco? no       Alcohol:    no    Family History   Problem Relation Age of Onset    Diabetes Mother     Hypertension Mother     Diabetes Father     Hypertension Father     Breast cancer Neg Hx        Review of Systems  CONST: No wt change, no f/c/s  EYES:  No blurry vision, double vision  HENT:  No hearing loss, tinnitus, allergies, teeth problems  CV:   No CP, palpitations, leg swelling  RESP:  No SOB, cough, wheeze  GI:   No n/v, diarrhea, constipation, abd pain, heartburn, blood in stool  GU:   No dysuria, frequency, incontinence  MSK:  No joint or muscle pain  SKIN:   No rashes or concerning moles  ENDO:  No heat/cold intolerance, polyuria, polydipsia  HEME:  No bruising/bleeding, swollen glands  NEURO:  No HA, LH, dizziness, numbness/tingling, weakness  PSYCH:  No depressed mood, anhedonia, anxiety    Physical Exam:  BP 161/69    Pulse 72    Temp 98.2 F (36.8 C)    Resp 19    Ht 1.588 m (5' 2.5")    Wt 77.6 kg (171 lb)    SpO2 98%    BMI 30.78 kg/m   Wt Readings from Last 3 Encounters:   01/02/20 77.6 kg (171 lb)   10/14/19 80.6 kg (177 lb 12.8 oz)   07/02/19 79.7 kg (175 lb 9.6 oz)     CONST: NAD, normal weight  HENT: B TMs normal, OP clear, normal dentition  EYES: PERRL, no pallor or scleral icterus, conjunctiva not injected  NECK: supple, no TM or nodules  LYMPH: no cervical or supraclavicular LAD  CV: RRR, no m/r/g.  No LE edema.  Pedal pulses present and equal.  RESP: CTAB, normal effort  GI: soft, NT/ND, NABS, no HSM  SKIN: warm, dry.  no visible rashes or concerning moles  MSK: Grossly normal ROM and strength in all 4 extremities  NEURO: No CN deficits, normal gait  PSYCH: A&O x3, normal mood and affect    Labs  ECG: normal sinus rhythm, no blocks or conduction defects, no ischemic changes.     Lab Results   Component Value Date    WBC 3.83 01/02/2020    HGB 12.9 01/02/2020    HCT 40.6 01/02/2020    MCV 91.0 01/02/2020    PLT 394 (H) 01/02/2020     '  Chemistry        Component Value Date/Time    NA 139  01/02/2020 1307    K 4.1 01/02/2020 1307    CL 102 01/02/2020 1307    CO2 30 (H) 01/02/2020 1307    BUN 15.0 01/02/2020 1307    CREAT 0.7 01/02/2020 1307    GLU 89 01/02/2020 1307        Component Value Date/Time    CA 9.6 01/02/2020 1307    ALKPHOS 87 07/02/2019 0941    AST 23 07/02/2019 0941    ALT 23 07/02/2019 0941    BILITOTAL 0.3 07/02/2019 0941        Lab Results   Component Value Date    PTT 34 01/02/2020     MRSA throat and nares is negative  Chest xray normal  UA normal  Lab Results   Component Value Date    INR 1.0 01/02/2020    PT 11.5 01/02/2020       Assessment/Recommendations:    Assessment/Plan:      1. Arthritis of left knee      2. Preop exam for internal medicine    - CBC and differential  - ECG 12 lead  - Urinalysis Reflex to Microscopic Exam- Reflex to Culture  - X-ray chest PA and lateral  - Culture Staph aureus and MRSA Surveillan  - PT/APTT  - Basic Metabolic Panel  - Culture Staph aureus and MRSA Surveillan    3. Essential hypertension    - ECG 12 lead  - Basic Metabolic Panel    There is no evidence of unstable angina, decompensated heart failure, or severe arrythmia.  .  I reviewed her labs and are normal and as above  Revised Cardiac Risk Index (high risk surgery, h/o CAD, h/o CHF, h/o CVD, DM w/ insulin, Cr >2) indicates a <0.04percent risk of major cardiac complication (MI, cardiac arrest, death).  The patient may continue all of his/her medications, except: NSAIDS   There are no medical contraindications to surgery, and I recommend proceeding as planned        Dr Windell Moment MD  Internist  [facility] [place]  [address], [place],  [id number]  [phone]  Fax-[phone]

## 2020-01-03 LAB — GFR: EGFR: >60

## 2020-01-03 LAB — BASIC METABOLIC PANEL
Anion Gap: 7 (ref 5.0–15.0)
BUN: 15 mg/dL (ref 7.0–19.0)
CO2: 30 mEq/L — ABNORMAL HIGH (ref 21–29)
Calcium: 9.6 mg/dL (ref 8.5–10.5)
Chloride: 102 mEq/L (ref 100–111)
Creatinine: 0.7 mg/dL (ref 0.4–1.5)
Glucose: 89 mg/dL (ref 70–100)
Potassium: 4.1 mEq/L (ref 3.5–5.1)
Sodium: 139 mEq/L (ref 136–145)

## 2020-01-03 LAB — PRESURGICAL SURVEILLANCE, MSSA+MRSA

## 2020-01-03 LAB — HEMOLYSIS INDEX: Hemolysis Index: 4 (ref 0–18)

## 2020-01-06 ENCOUNTER — Encounter (INDEPENDENT_AMBULATORY_CARE_PROVIDER_SITE_OTHER): Payer: Self-pay | Admitting: Internal Medicine

## 2020-01-06 ENCOUNTER — Telehealth (INDEPENDENT_AMBULATORY_CARE_PROVIDER_SITE_OTHER): Payer: Self-pay

## 2020-01-06 NOTE — Progress Notes (Signed)
Chest xray pre-op is normal.

## 2020-01-06 NOTE — Progress Notes (Signed)
All preop labs normal  Will fax preop clearance today

## 2020-01-06 NOTE — Telephone Encounter (Signed)
Preop documents faxed to [facility] at [phone] electronically

## 2020-01-12 ENCOUNTER — Other Ambulatory Visit (INDEPENDENT_AMBULATORY_CARE_PROVIDER_SITE_OTHER): Payer: Self-pay

## 2020-01-12 ENCOUNTER — Telehealth (INDEPENDENT_AMBULATORY_CARE_PROVIDER_SITE_OTHER): Payer: Self-pay | Admitting: Internal Medicine

## 2020-01-12 NOTE — Telephone Encounter (Signed)
Called and spoke with pt. Pt states pt has a skin tag and would like mupirocin (Bactroban) 2 % ointment to be prescribe. Pt wants to know if Dr. Konrad Dolores can prescribe it without a visit.   Also, prior to the surgery in December 2021, pt wants to schedule an appt and would like Dr. Konrad Dolores to prescribe motrin 600-800mg  in case pt experiences pain and she would like to know if Dr. Konrad Dolores would be able to prescribe pain medication for that.

## 2020-01-12 NOTE — Telephone Encounter (Signed)
Pt is requesting a call back from the office. She said that she wanted to inform Dr. Konrad Dolores that she has postponed her surgery until January and she also said that she has a skin tag that she forgot to mention to her.   Please advise, the pt said that she was unable to send her a mychart message regarding this.       Phone: [phone]        Thank you !

## 2020-01-13 ENCOUNTER — Encounter (INDEPENDENT_AMBULATORY_CARE_PROVIDER_SITE_OTHER): Payer: Self-pay

## 2020-01-13 NOTE — Telephone Encounter (Signed)
I can do temporary motrin 600mg  until surgery     If its one skin tag we can take it out if  not advised to see dermatology so they can freeze it  Drs. Sawchuk & Masri-Fridling  Dr. Inocencio Homes Masri-Fridling  Dr. Drinda Butts    [address], [address]       [place], [place] [address]       [address], [address]  [place], [place] [address]  Phone: [phone]  Fax: [phone]    [facility] [place] [place]  Fleeta Emmer, M.D.   Loreli Slot, Montez Hageman., MD   Murtis Sink, MD    [address]  [place], [place] [address]   Phone: [phone]  Fax: [phone]     [facility]  Dr. Marisa Severin. Albert  Dr. Lelon Mast A. Toerge    [address], [address]  [place], [place] [address]   Phone: [phone]  Fax: [phone]    [facility]  Dr. Kyla Balzarine  Dr. Alver Fisher    [address], [address] [place] [address]  Phone: [phone]    Dr. Alanda Slim   [address] Bufford Buttner   [place], [place] [address]   Phone: [phone]   Fax: [phone]     [facility] Does Not Take [employer]   [address]   [address]   [place], [place] [address]   [phone]

## 2020-01-13 NOTE — Telephone Encounter (Signed)
Per pt's preference, mychart msg is sent with dr Recardo Evangelist recommendations

## 2020-01-15 ENCOUNTER — Other Ambulatory Visit (INDEPENDENT_AMBULATORY_CARE_PROVIDER_SITE_OTHER): Payer: Self-pay

## 2020-01-15 ENCOUNTER — Telehealth (INDEPENDENT_AMBULATORY_CARE_PROVIDER_SITE_OTHER): Payer: Self-pay

## 2020-01-15 DIAGNOSIS — I10 Essential (primary) hypertension: Secondary | ICD-10-CM

## 2020-01-15 DIAGNOSIS — E782 Mixed hyperlipidemia: Secondary | ICD-10-CM

## 2020-01-15 MED ORDER — ROSUVASTATIN CALCIUM 40 MG PO TABS
ORAL_TABLET | ORAL | 0 refills | Status: DC
Start: 2020-01-15 — End: 2020-01-16

## 2020-01-15 MED ORDER — LISINOPRIL 20 MG PO TABS
20.0000 mg | ORAL_TABLET | Freq: Every day | ORAL | 0 refills | Status: DC
Start: 2020-01-15 — End: 2020-01-16

## 2020-01-15 MED ORDER — NORTRIPTYLINE HCL 10 MG PO CAPS
10.0000 mg | ORAL_CAPSULE | ORAL | 0 refills | Status: DC | PRN
Start: 2020-01-15 — End: 2020-01-16

## 2020-01-15 MED ORDER — HYDROCHLOROTHIAZIDE 12.5 MG PO TABS
12.5000 mg | ORAL_TABLET | Freq: Every day | ORAL | 0 refills | Status: DC
Start: 2020-01-15 — End: 2020-01-16

## 2020-01-15 MED ORDER — IBUPROFEN 600 MG PO TABS
600.0000 mg | ORAL_TABLET | Freq: Four times a day (QID) | ORAL | 0 refills | Status: DC | PRN
Start: 2020-01-15 — End: 2020-01-16

## 2020-01-15 NOTE — Telephone Encounter (Signed)
Patient calling upset per patient pcp had stated that she was going to call in patients medication when asked what medication patient stated all that PCP had said. Patient was informed that PCP was out of the office due to emergency health reasons pt asking to speak with nurse.

## 2020-01-15 NOTE — Telephone Encounter (Signed)
Spoke with pt on the phone. Pt states pt would like refills of all prescribed medications on file. Pt also wanted motrin 600mg  tab to be prescribed as well. Refill encounter sent to Dr. Allena Katz, covering provider for Dr. Konrad Dolores since pt states pt needs these refills to be sent today.

## 2020-01-16 ENCOUNTER — Other Ambulatory Visit (INDEPENDENT_AMBULATORY_CARE_PROVIDER_SITE_OTHER): Payer: Self-pay | Admitting: Internal Medicine

## 2020-01-16 ENCOUNTER — Telehealth (INDEPENDENT_AMBULATORY_CARE_PROVIDER_SITE_OTHER): Payer: Self-pay | Admitting: Internal Medicine

## 2020-01-16 DIAGNOSIS — I10 Essential (primary) hypertension: Secondary | ICD-10-CM

## 2020-01-16 DIAGNOSIS — E782 Mixed hyperlipidemia: Secondary | ICD-10-CM

## 2020-01-16 MED ORDER — ROSUVASTATIN CALCIUM 40 MG PO TABS
ORAL_TABLET | ORAL | 0 refills | Status: DC
Start: 2020-01-16 — End: 2020-06-30

## 2020-01-16 MED ORDER — IBUPROFEN 600 MG PO TABS
600.0000 mg | ORAL_TABLET | Freq: Four times a day (QID) | ORAL | 0 refills | Status: DC | PRN
Start: 2020-01-16 — End: 2021-02-02

## 2020-01-16 MED ORDER — LISINOPRIL 20 MG PO TABS
20.0000 mg | ORAL_TABLET | Freq: Every day | ORAL | 0 refills | Status: DC
Start: 2020-01-16 — End: 2020-06-30

## 2020-01-16 MED ORDER — NORTRIPTYLINE HCL 10 MG PO CAPS
10.0000 mg | ORAL_CAPSULE | ORAL | 0 refills | Status: DC | PRN
Start: 2020-01-16 — End: 2021-02-02

## 2020-01-16 MED ORDER — HYDROCHLOROTHIAZIDE 12.5 MG PO TABS
12.5000 mg | ORAL_TABLET | Freq: Every day | ORAL | 0 refills | Status: DC
Start: 2020-01-16 — End: 2020-06-30

## 2020-01-16 NOTE — Telephone Encounter (Signed)
I sent all prescriptions

## 2020-01-16 NOTE — Telephone Encounter (Signed)
Pt called asking for all the medication that got approved yesterday to be resend to the correct pharmacy     Pharmacy  [facility] [address], [place] - [address] Phone:  [phone]   Fax:  [phone]

## 2020-01-16 NOTE — Telephone Encounter (Signed)
Pt would like the medications to be sent to a different pharmacy than the one prescriptions were originally sent to. I am routing the rx request.

## 2020-01-30 ENCOUNTER — Other Ambulatory Visit (INDEPENDENT_AMBULATORY_CARE_PROVIDER_SITE_OTHER): Payer: Self-pay

## 2020-01-30 MED ORDER — FLUTICASONE PROPIONATE 50 MCG/ACT NA SUSP
1.0000 | NASAL | 6 refills | Status: DC | PRN
Start: 2020-01-30 — End: 2020-06-30

## 2020-03-17 ENCOUNTER — Ambulatory Visit: Payer: No Typology Code available for payment source | Attending: Orthopaedic Surgery

## 2020-03-17 DIAGNOSIS — M7122 Synovial cyst of popliteal space [Baker], left knee: Secondary | ICD-10-CM | POA: Insufficient documentation

## 2020-03-17 DIAGNOSIS — M25462 Effusion, left knee: Secondary | ICD-10-CM | POA: Insufficient documentation

## 2020-03-17 DIAGNOSIS — M1712 Unilateral primary osteoarthritis, left knee: Secondary | ICD-10-CM | POA: Insufficient documentation

## 2020-05-18 ENCOUNTER — Encounter (INDEPENDENT_AMBULATORY_CARE_PROVIDER_SITE_OTHER): Payer: Self-pay | Admitting: Internal Medicine

## 2020-05-18 ENCOUNTER — Encounter (INDEPENDENT_AMBULATORY_CARE_PROVIDER_SITE_OTHER): Payer: Self-pay | Admitting: Family Medicine

## 2020-06-28 ENCOUNTER — Other Ambulatory Visit: Payer: Self-pay | Admitting: Orthopaedic Surgery

## 2020-06-28 DIAGNOSIS — M1712 Unilateral primary osteoarthritis, left knee: Secondary | ICD-10-CM

## 2020-06-29 ENCOUNTER — Ambulatory Visit (INDEPENDENT_AMBULATORY_CARE_PROVIDER_SITE_OTHER): Payer: No Typology Code available for payment source | Admitting: Internal Medicine

## 2020-06-29 ENCOUNTER — Encounter (INDEPENDENT_AMBULATORY_CARE_PROVIDER_SITE_OTHER): Payer: Self-pay | Admitting: Internal Medicine

## 2020-06-29 VITALS — BP 129/79 | HR 66 | Temp 97.5°F | Resp 18 | Ht 62.8 in | Wt 173.2 lb

## 2020-06-29 DIAGNOSIS — Z01818 Encounter for other preprocedural examination: Secondary | ICD-10-CM

## 2020-06-29 DIAGNOSIS — I10 Essential (primary) hypertension: Secondary | ICD-10-CM

## 2020-06-29 DIAGNOSIS — M1712 Unilateral primary osteoarthritis, left knee: Secondary | ICD-10-CM

## 2020-06-29 LAB — URINALYSIS WITH MICROSCOPIC
Bilirubin, UA: NEGATIVE
Blood, UA: NEGATIVE
Glucose, UA: NEGATIVE
Ketones UA: NEGATIVE
Leukocyte Esterase, UA: NEGATIVE
Nitrite, UA: NEGATIVE
Protein, UR: NEGATIVE
Specific Gravity UA: 1.012 (ref 1.001–1.035)
Urine Bacteria: NONE SEEN /hpf
Urine pH: 7.5 (ref 5.0–8.0)
Urobilinogen, UA: 0.2 (ref 0.2–2.0)

## 2020-06-29 NOTE — Progress Notes (Signed)
Have you seen any specialists/other providers since your last visit with us?    No    Arm preference verified?   Yes    The patient is due for mammogram, shingles vaccine and Advance Directive

## 2020-06-29 NOTE — Progress Notes (Addendum)
Chief Complaint   Patient presents with    Pre-op Exam     Fasting. Pt Need Refill       Wendy [name] is a 65 y.o. female who presents to the office today for a preoperative consultation at the request of surgeon Dr Alfonse Spruce [phone]  [phone] or [phone] who plans on performing Left TKA on April 12th 2022    Patient has a history of HTN and HLD       Planned anesthesia: regional, epidural and general.   The patient has no known anesthesia issues   Patient has no Hx  bleeding risk in the past    The surgeon has requested the following labs/studies to be performed:        Pt has no personal history of cardiac or pulmonary disease.  Pt has no personal or family history of bleeding or VTE.  Pt is able to climb a flight of stairs without stopping or having symptoms.        Past Medical History:   Diagnosis Date    Brain tumor (benign)     Hypercholesteremia     Hypertensive disorder       Past Surgical History:   Procedure Laterality Date    BRAIN SURGERY  2009    BREAST BIOPSY Right 2008    right breast biopsy- benign    BREAST BIOPSY Right 1970s    right breast tumor removed 40+ years ago - benign    BREAST CYST EXCISION  1970s    right breast tumor removed 40+ years ago - benign    CESAREAN SECTION      CORNEAL TRANSPLANT      EYE SURGERY      TUBAL LIGATION        (Not in a hospital admission)    Current/Home Medications    CO-ENZYME Q-10 50 MG CAPSULE    Take 50 mg by mouth daily.    FLUTICASONE (FLONASE) 50 MCG/ACT NASAL SPRAY    1 spray by Nasal route as needed for Rhinitis    HYDROCHLOROTHIAZIDE (HYDRODIURIL) 12.5 MG TABLET    Take 1 tablet (12.5 mg total) by mouth daily    IBUPROFEN (ADVIL) 600 MG TABLET    Take 1 tablet (600 mg total) by mouth every 6 (six) hours as needed for Pain    LISINOPRIL (ZESTRIL) 20 MG TABLET    Take 1 tablet (20 mg total) by mouth daily    MULTIPLE VITAMIN (MULTIVITAMIN) TABLET    Take 1 tablet by mouth daily.    NORTRIPTYLINE (PAMELOR) 10 MG CAPSULE    Take 1  capsule (10 mg total) by mouth as needed (sleep)    ROSUVASTATIN (CRESTOR) 40 MG TABLET    1 tab qod    TRIAMCINOLONE (KENALOG) 0.025 % OINTMENT    Apply topically 2 (two) times daily    VITAMIN D, ERGOCALCIFEROL, (DRISDOL) 50000 UNIT CAP    Take 50,000 Units by mouth once a week     Allergies   Allergen Reactions    Iodine Shortness Of Breath    Penicillins Shortness Of Breath     Has patient had a PCN reaction causing immediate rash, facial/tongue/throat swelling, SOB or lightheadedness with hypotension: Yes  Has patient had a PCN reaction causing severe rash involving mucus membranes or skin necrosis: No  Has patient had a PCN reaction that required hospitalization: No  Has patient had a PCN reaction occurring within the last 10 years: Yes  If  all of the above answers are "NO", then may proceed with Cephalosporin use.    Shellfish-Derived Products Anaphylaxis    Atorvastatin      Joint pain     Morphine      Itchy     Tetanus Immune Globulin      Arm swelling     Zocor [Simvastatin]      No response       Social History     Tobacco Use    Smoking status: Never Smoker    Smokeless tobacco: Never Used   Substance Use Topics    Alcohol use: Yes     Alcohol/week: 1.0 - 2.0 standard drink     Types: 1 - 2 Shots of liquor per week      Family History   Problem Relation Age of Onset    Diabetes Mother     Hypertension Mother     Diabetes Father     Hypertension Father     Breast cancer Neg Hx             Tobacco? no      Alcohol:    no        Review of Systems  CONST: No wt change, no f/c/s  EYES:  No blurry vision, double vision  HENT:  No hearing loss, tinnitus, allergies, teeth problems  CV:   No CP, palpitations, leg swelling  RESP:  No SOB, cough, wheeze  GI:   No n/v, diarrhea, constipation, abd pain, heartburn, blood in stool  GU:   No dysuria, frequency, incontinence  MSK:  Left knee arthritis SKIN:   No rashes or concerning moles  ENDO:  No heat/cold intolerance, polyuria, polydipsia  HEME:  No  bruising/bleeding, swollen glands  NEURO:  No HA, LH, dizziness, numbness/tingling, weakness  PSYCH:  No depressed mood, anhedonia, anxiety    Vitals:    06/29/20 0852   BP: 129/79   Pulse: 66   Resp: 18   Temp: 97.5 F (36.4 C)   SpO2: 99%       CONST: NAD, normal weight  HENT: B TMs normal, OP clear, normal dentition  EYES: PERRL, no pallor or scleral icterus, conjunctiva not injected  NECK: supple, no TM or nodules  LYMPH: no cervical or supraclavicular LAD  CV: RRR, no m/r/g.  No LE edema.  Pedal pulses present and equal.  RESP: CTAB, normal effort  GI: soft, NT/ND, NABS, no HSM  SKIN: warm, dry.  no visible rashes or concerning moles  MSK: Grossly normal ROM and strength in all 4 extremities  NEURO: No CN deficits, normal gait  PSYCH: A&O x3, normal mood and affect    Labs  ECG: normal sinus rhythm, no blocks or conduction defects, no ischemic changes. Unchanged since 2019      Lab Results   Component Value Date    WBC 4.53 07/09/2020    HGB 12.5 07/09/2020    HCT 41.5 07/09/2020    MCV 93.7 07/09/2020    PLT 361 (H) 07/09/2020       '  Chemistry        Component Value Date/Time    NA 140 06/30/2020 1100    K 4.1 06/30/2020 1100    CL 103 06/30/2020 1100    CO2 28 06/30/2020 1100    BUN 16.0 06/30/2020 1100    CREAT 0.7 06/30/2020 1100    GLU 89 06/30/2020 1100        Component Value Date/Time  CA 9.4 06/30/2020 1100    ALKPHOS 65 06/30/2020 1100    AST 24 06/30/2020 1100    ALT 24 06/30/2020 1100    BILITOTAL 0.4 06/30/2020 1100        Lab Results   Component Value Date    INR 0.9 06/30/2020    INR 1.0 01/02/2020    PT 10.7 06/30/2020    PT 11.5 01/02/2020     Lab Results   Component Value Date    PTT 33 06/30/2020       Urine normal Chest xray Normal MRSA/MSSA cultures nose and throat normal  Assessment/Plan:      1. Arthritis of left knee    - CBC and differential; Future  - ECG 12 lead  - PT/APTT; Future  - Urinalysis with microscopic  - X-ray chest PA and lateral  - Presurgical Surveillance,  MSSA+MRSA  - Comprehensive metabolic panel; Future    2. Essential hypertension      3. Preop exam for internal medicine    - CBC and differential; Future  - ECG 12 lead  - PT/APTT; Future  - Urinalysis with microscopic  - X-ray chest PA and lateral  - Presurgical Surveillance, MSSA+MRSA  - Comprehensive metabolic panel; Future  - Presurgical Surveillance, MSSA+MRSA      There is no evidence of unstable angina, decompensated heart failure, or severe arrythmia.  .  I reviewed her EKG  and are normal and as above  I reviewed her labs, all normal   Repeat platelets normal Revised Cardiac Risk Index (high risk surgery, h/o CAD, h/o CHF, h/o CVD, DM w/ insulin, Cr >2) indicates a <0.04percent risk of major cardiac complication (MI, cardiac arrest, death).  The patient may continue all of his/her medications, except: NSAIDS   There are no medical contraindications to surgery, and I recommend proceeding as planned        Dr Windell Moment MD  Internist  [facility] [place]  [address], [place],  [place] [id number]  [phone]  Fax-[phone]

## 2020-06-30 ENCOUNTER — Other Ambulatory Visit: Payer: Self-pay | Admitting: Internal Medicine

## 2020-06-30 ENCOUNTER — Ambulatory Visit (INDEPENDENT_AMBULATORY_CARE_PROVIDER_SITE_OTHER): Payer: No Typology Code available for payment source | Admitting: Internal Medicine

## 2020-06-30 ENCOUNTER — Encounter (INDEPENDENT_AMBULATORY_CARE_PROVIDER_SITE_OTHER): Payer: Self-pay | Admitting: Internal Medicine

## 2020-06-30 VITALS — BP 122/76 | HR 62 | Temp 97.3°F | Resp 18 | Ht 62.0 in | Wt 173.3 lb

## 2020-06-30 DIAGNOSIS — Z780 Asymptomatic menopausal state: Secondary | ICD-10-CM

## 2020-06-30 DIAGNOSIS — Z947 Corneal transplant status: Secondary | ICD-10-CM

## 2020-06-30 DIAGNOSIS — M7918 Myalgia, other site: Secondary | ICD-10-CM

## 2020-06-30 DIAGNOSIS — Z833 Family history of diabetes mellitus: Secondary | ICD-10-CM

## 2020-06-30 DIAGNOSIS — Z1231 Encounter for screening mammogram for malignant neoplasm of breast: Secondary | ICD-10-CM

## 2020-06-30 DIAGNOSIS — Z13820 Encounter for screening for osteoporosis: Secondary | ICD-10-CM

## 2020-06-30 DIAGNOSIS — Z79899 Other long term (current) drug therapy: Secondary | ICD-10-CM

## 2020-06-30 DIAGNOSIS — E782 Mixed hyperlipidemia: Secondary | ICD-10-CM

## 2020-06-30 DIAGNOSIS — R7989 Other specified abnormal findings of blood chemistry: Secondary | ICD-10-CM

## 2020-06-30 DIAGNOSIS — M1712 Unilateral primary osteoarthritis, left knee: Secondary | ICD-10-CM

## 2020-06-30 DIAGNOSIS — Z130 Encounter for screening for diseases of the blood and blood-forming organs and certain disorders involving the immune mechanism: Secondary | ICD-10-CM

## 2020-06-30 DIAGNOSIS — Z01818 Encounter for other preprocedural examination: Secondary | ICD-10-CM

## 2020-06-30 DIAGNOSIS — Z1329 Encounter for screening for other suspected endocrine disorder: Secondary | ICD-10-CM

## 2020-06-30 DIAGNOSIS — I10 Essential (primary) hypertension: Secondary | ICD-10-CM

## 2020-06-30 DIAGNOSIS — Z0000 Encounter for general adult medical examination without abnormal findings: Secondary | ICD-10-CM

## 2020-06-30 DIAGNOSIS — E559 Vitamin D deficiency, unspecified: Secondary | ICD-10-CM

## 2020-06-30 DIAGNOSIS — J302 Other seasonal allergic rhinitis: Secondary | ICD-10-CM

## 2020-06-30 LAB — PRESURGICAL SURVEILLANCE, MSSA+MRSA

## 2020-06-30 LAB — TSH: TSH: 0.83 u[IU]/mL (ref 0.35–4.94)

## 2020-06-30 LAB — COMPREHENSIVE METABOLIC PANEL
ALT: 24 U/L (ref 0–55)
AST (SGOT): 24 U/L (ref 5–34)
Albumin/Globulin Ratio: 1.3 (ref 0.9–2.2)
Albumin: 3.8 g/dL (ref 3.5–5.0)
Alkaline Phosphatase: 65 U/L (ref 37–117)
Anion Gap: 9 (ref 5.0–15.0)
BUN: 16 mg/dL (ref 7.0–19.0)
Bilirubin, Total: 0.4 mg/dL (ref 0.2–1.2)
CO2: 28 mEq/L (ref 21–29)
Calcium: 9.4 mg/dL (ref 8.5–10.5)
Chloride: 103 mEq/L (ref 100–111)
Creatinine: 0.7 mg/dL (ref 0.4–1.5)
Globulin: 2.9 g/dL (ref 2.0–3.7)
Glucose: 89 mg/dL (ref 70–100)
Potassium: 4.1 mEq/L (ref 3.5–5.1)
Protein, Total: 6.7 g/dL (ref 6.0–8.3)
Sodium: 140 mEq/L (ref 136–145)

## 2020-06-30 LAB — CBC AND DIFFERENTIAL
Absolute NRBC: 0 10*3/uL (ref 0.00–0.00)
Basophils Absolute Automated: 0.04 10*3/uL (ref 0.00–0.08)
Basophils Automated: 0.8 %
Eosinophils Absolute Automated: 0.1 10*3/uL (ref 0.00–0.44)
Eosinophils Automated: 2 %
Hematocrit: 35 % (ref 34.7–43.7)
Hgb: 11 g/dL — ABNORMAL LOW (ref 11.4–14.8)
Immature Granulocytes Absolute: 0 10*3/uL (ref 0.00–0.07)
Immature Granulocytes: 0 %
Lymphocytes Absolute Automated: 1.98 10*3/uL (ref 0.42–3.22)
Lymphocytes Automated: 40.3 %
MCH: 28.1 pg (ref 25.1–33.5)
MCHC: 31.4 g/dL — ABNORMAL LOW (ref 31.5–35.8)
MCV: 89.5 fL (ref 78.0–96.0)
MPV: 12.9 fL — ABNORMAL HIGH (ref 8.9–12.5)
Monocytes Absolute Automated: 0.28 10*3/uL (ref 0.21–0.85)
Monocytes: 5.7 %
Neutrophils Absolute: 2.51 10*3/uL (ref 1.10–6.33)
Neutrophils: 51.2 %
Nucleated RBC: 0 /100 WBC (ref 0.0–0.0)
Platelets: 132 10*3/uL — ABNORMAL LOW (ref 142–346)
RBC: 3.91 10*6/uL (ref 3.90–5.10)
RDW: 13 % (ref 11–15)
WBC: 4.91 10*3/uL (ref 3.10–9.50)

## 2020-06-30 LAB — LIPID PANEL
Cholesterol / HDL Ratio: 3.8
Cholesterol: 200 mg/dL — ABNORMAL HIGH (ref 0–199)
HDL: 53 mg/dL (ref 40–9999)
LDL Calculated: 125 mg/dL — ABNORMAL HIGH (ref 0–99)
Triglycerides: 109 mg/dL (ref 34–149)
VLDL Calculated: 22 mg/dL (ref 10–40)

## 2020-06-30 LAB — HEPATIC FUNCTION PANEL
ALT: 25 U/L (ref 0–55)
AST (SGOT): 25 U/L (ref 5–34)
Albumin/Globulin Ratio: 1.3 (ref 0.9–2.2)
Albumin: 3.8 g/dL (ref 3.5–5.0)
Alkaline Phosphatase: 65 U/L (ref 37–117)
Bilirubin Direct: 0.1 mg/dL (ref 0.0–0.5)
Bilirubin Indirect: 0.3 mg/dL (ref 0.2–1.0)
Bilirubin, Total: 0.4 mg/dL (ref 0.2–1.2)
Globulin: 3 g/dL (ref 2.0–3.7)
Protein, Total: 6.8 g/dL (ref 6.0–8.3)

## 2020-06-30 LAB — GFR: EGFR: >60

## 2020-06-30 LAB — PT AND APTT
PT INR: 0.9 (ref 0.9–1.1)
PT: 10.7 s (ref 10.1–12.9)
PTT: 33 s (ref 27–39)

## 2020-06-30 LAB — VITAMIN D,25 OH,TOTAL: Vitamin D, 25 OH, Total: 15 ng/mL — ABNORMAL LOW (ref 30–100)

## 2020-06-30 LAB — HEMOLYSIS INDEX
Hemolysis Index: 7 (ref 0–24)
Hemolysis Index: 7 (ref 0–24)

## 2020-06-30 LAB — HEMOGLOBIN A1C
Average Estimated Glucose: 102.5 mg/dL
Hemoglobin A1C: 5.2 % (ref 4.6–5.9)

## 2020-06-30 MED ORDER — PREDNISOLONE ACETATE 1 % OP SUSP
1.0000 [drp] | Freq: Four times a day (QID) | OPHTHALMIC | 3 refills | Status: AC
Start: 2020-06-30 — End: ?

## 2020-06-30 MED ORDER — ROSUVASTATIN CALCIUM 40 MG PO TABS
ORAL_TABLET | ORAL | 4 refills | Status: DC
Start: 2020-06-30 — End: 2020-08-18

## 2020-06-30 MED ORDER — FLUTICASONE PROPIONATE 50 MCG/ACT NA SUSP
1.0000 | NASAL | 6 refills | Status: DC | PRN
Start: 2020-06-30 — End: 2021-02-02

## 2020-06-30 MED ORDER — LISINOPRIL 20 MG PO TABS
20.0000 mg | ORAL_TABLET | Freq: Every day | ORAL | 4 refills | Status: AC
Start: 2020-06-30 — End: 2020-12-27

## 2020-06-30 MED ORDER — HYDROCHLOROTHIAZIDE 12.5 MG PO TABS
12.5000 mg | ORAL_TABLET | Freq: Every day | ORAL | 4 refills | Status: DC
Start: 2020-06-30 — End: 2020-08-18

## 2020-06-30 NOTE — Patient Instructions (Signed)
Wt Readings from Last 3 Encounters:   06/30/20 78.6 kg (173 lb 4.8 oz)   06/29/20 78.6 kg (173 lb 3.2 oz)   01/02/20 77.6 kg (171 lb)     Body mass index is 31.7 kg/m.           BMI readings as below   18.9 to 24.9 -Normal   25-29.9-overweight  30-34.9-stage-1  35-39.9-stage 2  40-44.9-stage 3  >45-morbid         Try to limit the amount of saturated fat and trans fat in your diet.  These are in foods like red meat, vegetable oil, butter, dairy products that are not low-fat (such as most cheese, 2% or whole milk, creamer, etc), fried foods, and rich sweets like cakes/donuts/etc.  You should aim to keep saturated fats under 15-20g per day.  In addition, read ingredient labels and avoid any hydrogenated or partially-hydrogenated oils.      There are some fats that are good for you - these are unsaturated fats (especially monounsaturated).  These are found in olive oil, canola oil, nuts, seeds, and avocados.  Try to replace saturated and trans fats with these kinds of foods.    Try to limit the amount of sodium in your diet to less than 2000mg  per day.  Most people do not get the majority of their sodium from table salt.  The sodium comes from processed foods.  Anything canned, frozen, pre-packaged, etc usually has a large amount of sodium, so read the labels.  Deli meat is another common source.      Eating out is the most common source of dietary sodium.  Almost all restaurant meals have extremely high sodium (1000-3000mg  plus!), even in most cases the salads.  Minimize eating out, and ask if they have any low sodium options.  But generally, fresh cooking at home is the healthiest.      Try to increase your physical activity, especially cardio.  Even starting small with walking 10 minutes a day helps.  Gradually build up to a goal of 30 minutes of moderate activity (brisk walking) 5 days per week.  You should also add in some core strength training (abdomen and back) and light stretching to help prevent injury and  chronic back pain.    See me once yearly for annual preventative visit.  This is different and separate from any problem-related visit.    Be compliant with medications  Increase vegetables, fruits, and fiber in your diet.  Exercise at least 30 minutes 5 days per week.   Always wear your seatbelt in the car.  Wear sunscreen that is broad-spectrum (UVA/UVB) and at least SPF 30.  Avoid alcohol  drinking  And driving  Goal Blood Pressure <140/90  Schedule dental exam and cleaning every 6 months  Have your vision checked every 1-2 years.      Recommended preventive health screenings and vaccines for all adults   Blood pressure Every 2 years -- 2 years of age and older  Body mass index (BMI) Periodically -- 49 years of age and older  Cholesterol Every 5 years -- men 20 years of age and older;  Colorectal cancer Between 73 and 79 years of age -- colonoscopy every 10 years.   Depression Routinely   Alcohol misuse  Routinely  Tobacco use   Routinely    Vaccinations  Tetanus-diphtheria-pertussis (Td/Tdap)1 dose Tdap, then Td every 10 years  Influenza Every flu season  Pneumococcal 1 dose -- 2 years of age  and older  Zoster 1 dose -- 37 years of age and older    Females  Mammogram Every 1-2 years -- women 6 years of age and older  Cervical cancer Every 3 years -- Pap smear for women 21-65 years   Chlamydia Routinely -- women 35 years of age and younger if sexually active  Osteoporosis (DEXA) Routinely -- women 75 years of age and older    Age 72-64 (middle years):   Diet and exercise d/w patient.   Self breast and testicular exam( in Males)  encouraged monthly   Clinical breast examination (yearly).   Skin exam with dermatology --for excessive skin exposure to sun or precancerous   Fasting lipid profile or nonfasting total cholesterol and HDL  cholesterol level (every 5 years).   Pelvic exam annually and Pap test every 3 years.   Digital rectal examination (annually).   After age 21, screen for colorectal cancer  with colonoscopy every 10 years or as advised.   Mammogram every 2 years    Oral cavity examination (every 1 to 3 years).   Dental exam (every 6 to 12 months).   Glaucoma (every 1 to 2 years after age 83).  Goal calcium intake 1092m daily (diet +/- supplement)  Goal vitamin D intake 600units daily (diet +/- supplement)    I appreciate the opportunity to help take care of you and your family. Here is some basic housekeeping:    1) Please be aware that I often send results/next steps via MyChart. Please check your account regularly.  2) There are some results that take up to 10 business days to be received and/or processed. If you have not heard from me regarding results in 7-10 business days please feel free to call [phone] send a message via [facility]

## 2020-06-30 NOTE — Progress Notes (Signed)
Chief Complaint   Patient presents with    Medicare Annual Wellness Visit     Fasting     Wendy [name] is a 65 y.o. female who presents today for a Medicare Annual Wellness Visit.     Health Risk Assessment     During the past month, how would you rate your general health?:  Very Good  Which of the following tasks can you do without assistance - drive or take the bus alone; shop for groceries or clothes; prepare your own meals; do your own housework/laundry; handle your own finances/pay bills; eat, bathe or get around your home?:  Do your own housework/laundry,Prepare your own meals,Shop for groceries or clothes,Eat, bathe, dress or get around your home,Drive or take the bus alone,Handle your own finances/pay bills  Which of the following problems have you been bothered by in the past month - dizzy when standing up; problems using the phone; feeling tired or fatigued; moderate or severe body pain?: Moderate or severe body pain (Knee pain)  Do you exercise for about 20 minutes 3 or more days per week?:  Yes  During the past month was someone available to help if you needed and wanted help?  For example, if you felt nervous, lonely, got sick and had to stay in bed, needed someone to talk to, needed help with daily chores or needed help just taking care of yourself.: Yes  Do you always wear a seat belt?: Yes  Do you have any trouble taking medications the way you have been told to take them?: No  Have you been given any information that can help you with keeping track of your medications?: No  Do you have trouble paying for your medications?: No  Have you been given any information that can help you with hazards in your house, such as scatter rugs, furniture, etc?: No  Do you feel unsteady when standing or walking?: No  Do you worry about falling?: No  Have you fallen two or more times in the past year?: No  Did you suffer any injuries from your falls in the past year?: No       Hx HLD  Compliant with cholesterol  medication  No Myalgias,  Muscle aches , bodyaches  Compliant with low saturated and low carbohydrate Diet  No hx abnormal liver test related to statin therapy       History of Chronic Hypertension  .The patient is compliant with anti-hypertensive therapy and denies side effects to therapy.  Pt denies CP, SOB, dizziness, orthopnea, PND or edema.   Is tolerating meds without side effects.   Patient  is compliant with Medications  BP at goal    Hx left corneal transplanyt requesting prednisone drops from me  Sees eye specialist      Hospitalizations  no hospitalizations within past 6 months    Depression Screening    See related Activity or Flowsheet    Functional Ability    Falls Risk:  home does not have throw rugs, poor lighting or a slippery bath tub or shower  Hearing:  hearing within normal limits  Exercise:  walking  ADL's:   Bathing - independent   Dressing - independent   Mobility - independent   Transfer - independent   Eating - independent}   Toileting - independent   ADL assistance not needed          Vision Screening (required for IPPE only): not performed by staff today  Evaluation of Cognitive Function    Mood/affect: Appropriate  Appearance: neatly groomed, appropriately and adequately nourished  Family member/caregiver input: Not present            Depression Screen  Q1: Over the past two weeks, have you felt down, depressed or hopeless? no  Q2: Over the past two weeks, have you felt little interest or pleasure in doing things? no          Cognitive evaluation:;  Level of consciousness; alert and awake through out the visit    Attention and concentration: able to stay focused, participated in the conversation, no tangential thoughts, good concentration, no distraction noted, able to give me series of even and odd numbers, oriented to time, place, person, year, month and date    Language: Fluent in english, no slurred speech,  Able to name 3 objects, obeys simple and complex commands  Performs  simple and complex task              Past Medical History:     Past Medical History:   Diagnosis Date    Brain tumor (benign)     Hypercholesteremia     Hypertensive disorder        Past Surgical History:     Past Surgical History:   Procedure Laterality Date    BRAIN SURGERY  2009    BREAST BIOPSY Right 2008    right breast biopsy- benign    BREAST BIOPSY Right 1970s    right breast tumor removed 40+ years ago - benign    BREAST CYST EXCISION  1970s    right breast tumor removed 40+ years ago - benign    CESAREAN SECTION      CORNEAL TRANSPLANT      EXCHANGE, CORNEAL IMPLANT Left     EYE SURGERY      TUBAL LIGATION         Medications:     Prior to Admission medications    Medication Sig Start Date End Date Taking? Authorizing Provider   co-enzyme Q-10 50 MG capsule Take 50 mg by mouth daily.   Yes [provider]   fluticasone (FLONASE) 50 MCG/ACT nasal spray 1 spray by Nasal route as needed for Rhinitis 01/30/20  Yes Bouhouch, Jacqualyn Posey, MD   hydroCHLOROthiazide (HYDRODIURIL) 12.5 MG tablet Take 1 tablet (12.5 mg total) by mouth daily 01/16/20 07/14/20 Yes Clydell Hakim, MD   ibuprofen (ADVIL) 600 MG tablet Take 1 tablet (600 mg total) by mouth every 6 (six) hours as needed for Pain 01/16/20  Yes Clydell Hakim, MD   lisinopril (ZESTRIL) 20 MG tablet Take 1 tablet (20 mg total) by mouth daily 01/16/20 07/14/20 Yes Clydell Hakim, MD   Multiple Vitamin (MULTIVITAMIN) tablet Take 1 tablet by mouth daily.   Yes [provider]   nortriptyline (PAMELOR) 10 MG capsule Take 1 capsule (10 mg total) by mouth as needed (sleep) 01/16/20  Yes Clydell Hakim, MD   rosuvastatin (CRESTOR) 40 MG tablet 1 tab qod 01/16/20  Yes Clydell Hakim, MD   triamcinolone (KENALOG) 0.025 % ointment Apply topically 2 (two) times daily  Patient taking differently: Apply topically as needed    08/23/17  Yes Bouhouch, Jacqualyn Posey, MD   vitamin D, ergocalciferol, (DRISDOL) 50000 UNIT Cap Take 50,000 Units by  mouth once a week   Yes [provider]        Allergies:     Allergies   Allergen Reactions  Iodine Shortness Of Breath    Penicillins Shortness Of Breath     Has patient had a PCN reaction causing immediate rash, facial/tongue/throat swelling, SOB or lightheadedness with hypotension: Yes  Has patient had a PCN reaction causing severe rash involving mucus membranes or skin necrosis: No  Has patient had a PCN reaction that required hospitalization: No  Has patient had a PCN reaction occurring within the last 10 years: Yes  If all of the above answers are "NO", then may proceed with Cephalosporin use.    Shellfish-Derived Products Anaphylaxis    Atorvastatin      Joint pain     Morphine      Itchy     Tetanus Immune Globulin      Arm swelling     Zocor [Simvastatin]      No response        Social History:     Social History     Tobacco Use    Smoking status: Never Smoker    Smokeless tobacco: Never Used   Substance Use Topics    Alcohol use: Yes     Alcohol/week: 1.0 - 2.0 standard drink     Types: 1 - 2 Shots of liquor per week        Family History:     Family History   Problem Relation Age of Onset    Diabetes Mother     Hypertension Mother     Diabetes Father     Hypertension Father     Breast cancer Neg Hx          Review of Systems:   Review of Systems - History obtained from the patient  General ROS: negative for - chills, fatigue, fever, malaise, or weight loss  Psychological ROS: negative for - anxiety, c, depression,   Ophthalmic ROS: Hx left corneal transplant   uses glasses  ENT ROS: negative for - hearing change,   Allergy and Immunology ROS: negative for - seasonal allergies  Breast ROS: negative for breast lumps  Respiratory ROS: no cough, shortness of breath, or wheezing  negative for - hemoptysis  Cardiovascular ROS: no chest pain or dyspnea on exertion  negative for - palpitations or paroxysmal nocturnal dyspnea  Gastrointestinal ROS: no abdominal pain, change in bowel  habits, or black or bloody stools  Genito-Urinary ROS: no dysuria, trouble voiding, or hematuria: no menorrhagia or  abnormal cycle,   negative for - hematuria, urinary frequency/urgency or vulvar/vaginal symptoms  Musculoskeletal ROS: left knee OA due for surgery   Neurological ROS: negative for - dizziness, gait disturbance, headaches, numbness/tingling or visual changes    Physical Exam:    Reviewed today's Vitals as below  Vitals:    06/30/20 1003   BP: 122/76   Pulse: 62   Resp: 18   Temp: 97.3 F (36.3 C)   SpO2: 98%     Physical Examination: General appearance - alert, well appearing, and in no distress and oriented to person, place, and time  Mental status - alert, oriented to person, place, and time, normal mood, behavior, speech, dress, motor activity, and thought processes  Eyes - pupils equal and reactive, extraocular eye movements intact  Ears - bilateral TM's and external ear canals normal  Neck - supple, no significant adenopathy, carotids upstroke normal bilaterally, no bruits and thyroid exam: thyroid is normal in size without nodules or tenderness  Chest - clear to auscultation, no wheezes, rales or rhonchi, symmetric air entry  Heart - normal  rate, regular rhythm, normal S1, S2, no murmurs, rubs, clicks or gallops  Abdomen - soft, nontender, nondistended, no masses or organomegaly  No hepatosplenomegaly or organomegaly  Neurological - alert, oriented, normal speech, no focal findings or movement disorder noted, , motor and sensory grossly normal bilaterally  Musculoskeletal - no joint tenderness, deformity or swelling, no muscular tenderness noted  Extremities - peripheral pulses normal, no pedal edema, no clubbing or cyanosis  Breasts: breasts appear normal, no suspicious masses, no skin or nipple changes or axillary nodes.    Pelvic exam: NA was done 2020           Assessment and Plan:   LATISHIA SUITT is a 65 y.o. female here for medicare AWV   Medicare Annual Wellness Examination:  Health  maintenance  - I counseled the pt on the benefits of taking calcium 1200-1500 mg po q daily, vit d 400-800iu po q daily, regular sun exposure and weight bearing exercise for bone health  - I also counseled the pt on the benefits of eating a healthy diet, exercising regularly, maintaining a healthy weight and getting enough sleep   - I also counseled the pt about the importance of wearing seat belts and no texting while driving and using sun protection  - Diet:  I rec eating plenty of fruits, vegetables and whole grains and  limiting intake of salt, saturated and trans fat foods and limiting intake of processed foods  - Exercise:  I rec exercise (as tolerated)  - Tobacco: I rec avoidance of smoking  - Weight: I rec maintaining a healthy weight  - Prevention of chronic diseases: I rec eating healthy diet, wearing sun screen in sun, not smoking. Limiting consumption of alcohol.    Cancer screening  - pap: 21-65;2020  normal  - mammo: 50-75; referred, normal feb 2021  - colonoscopy: 50-70( more if life expectancy is more; 07/09/2016 [facility], Hx polyp  -Dexa; referred     Immunization:  Zoster: discussed   Pneumovax:NA  Hepatitis-B ; UTD  Influenza:; declined  Tdap; allergic   covid-19 UTD     counseling about safety  Secure all electrical cords out of the way so you do not trip over them.    Get rid of or secure all loose rugs that you could trip over.    Avoid clutter and low furniture on the floor, especially in major walkways.    Keep your home well-lighted so you can easily see where youre going.    Place a lamp near the bed so its easy to reach.    Avoid storing things in high places so you dont have to reach or climb.    If you need a step stool, make sure its a study one with a bar to hold on to.  Never use a chair.    Wear sturdy shoes that fit well.  Avoid high heels, slippery soles, and shoes that are too lose.  Also avoid walking around in socks or with bare feet.    Make sure all shower/bath tub floors  have a non-slip mat.    If needed, install grab bars and/or safety seats in the shower/bath tub.    Make sure all stairs have handrails and a non-slip surface.    Make sure all stairs have a working light above them.  Consider an alarm button that you can wear in case you fall and cannot reach.      Assessment/Plan:  1. Medicare annual wellness visit, subsequent    2. Essential hypertension    - hydroCHLOROthiazide (HYDRODIURIL) 12.5 MG tablet; Take 1 tablet (12.5 mg total) by mouth daily  Dispense: 90 tablet; Refill: 4  - lisinopril (ZESTRIL) 20 MG tablet; Take 1 tablet (20 mg total) by mouth daily  Dispense: 90 tablet; Refill: 4    3. On statin therapy    - Hepatic function panel (LFT)    4. Mixed hyperlipidemia    - Lipid panel  - rosuvastatin (CRESTOR) 40 MG tablet; 1 tab qod  Dispense: 90 tablet; Refill: 4    5. FHx: diabetes mellitus    - Hemoglobin A1C    6. Screening for thyroid disorder    - TSH    7. Screening for deficiency anemia      8. Screening mammogram, encounter for    - Mammo Digital Screening Bilateral W Cad    9. Screening for osteoporosis    - Dxa Bone Density Axial Skeleton    10. Post-menopausal    - Dxa Bone Density Axial Skeleton    11. Rhomboid muscle pain    - Ambulatory referral to Physical Therapy    12. Low vitamin D level    - Vitamin D,25 OH, Total    13. Status post corneal transplant    - prednisoLONE acetate (PRED FORTE) 1 % ophthalmic suspension; Place 1 drop into the left eye 4 (four) times daily  Dispense: 10 mL; Refill: 3    14. Seasonal allergic rhinitis, unspecified trigger    - fluticasone (FLONASE) 50 MCG/ACT nasal spray; 1 spray by Nasal route as needed for Rhinitis  Dispense: 15.8 mL; Refill: 6    Body mass index is 31.7 kg/m.        Dr Windell Moment MD  Internist  [facility] [place]  [address], [place],  [place] [address]  [phone]  [phone]

## 2020-06-30 NOTE — Progress Notes (Signed)
Have you seen any specialists/other providers since your last visit with us?    No    Arm preference verified?   Yes    The patient is due for mammogram and Advance Directive

## 2020-07-01 ENCOUNTER — Ambulatory Visit: Payer: No Typology Code available for payment source

## 2020-07-01 ENCOUNTER — Ambulatory Visit
Admission: RE | Admit: 2020-07-01 | Discharge: 2020-07-01 | Disposition: A | Discharge status: Home / Self Care | Payer: No Typology Code available for payment source | Source: Ambulatory Visit | Attending: Internal Medicine | Admitting: Internal Medicine

## 2020-07-01 ENCOUNTER — Other Ambulatory Visit (INDEPENDENT_AMBULATORY_CARE_PROVIDER_SITE_OTHER): Payer: Self-pay | Admitting: Internal Medicine

## 2020-07-01 DIAGNOSIS — M1712 Unilateral primary osteoarthritis, left knee: Secondary | ICD-10-CM

## 2020-07-01 DIAGNOSIS — M8589 Other specified disorders of bone density and structure, multiple sites: Secondary | ICD-10-CM | POA: Insufficient documentation

## 2020-07-01 DIAGNOSIS — D696 Thrombocytopenia, unspecified: Secondary | ICD-10-CM

## 2020-07-01 DIAGNOSIS — Z13820 Encounter for screening for osteoporosis: Secondary | ICD-10-CM | POA: Insufficient documentation

## 2020-07-01 DIAGNOSIS — Z780 Asymptomatic menopausal state: Secondary | ICD-10-CM | POA: Insufficient documentation

## 2020-07-01 DIAGNOSIS — R7989 Other specified abnormal findings of blood chemistry: Secondary | ICD-10-CM

## 2020-07-01 DIAGNOSIS — Z1231 Encounter for screening mammogram for malignant neoplasm of breast: Secondary | ICD-10-CM | POA: Insufficient documentation

## 2020-07-01 MED ORDER — ERGOCALCIFEROL 1.25 MG (50000 UT) PO CAPS
50000.0000 [IU] | ORAL_CAPSULE | ORAL | 0 refills | Status: DC
Start: 2020-07-01 — End: 2020-07-19

## 2020-07-01 NOTE — Progress Notes (Signed)
PLEASE CALL AND NOTIFY PATIENT WITH DOCUMENTATION     All labs normal  Except platelets low can be from lab clumping  Advised to repeat before surgery no fasting needed, lab visit  See cholestreol may need to increase dose to crestor   See vit D low  MRSA MSSA Cx  negative   Wendy [name]    Your Labs work results:  Cell count (WBC, Hemoglobin and Hematocrit  ) are normal  Lab Results       Component                Value               Date                       WBC                      4.91                06/30/2020                 HGB                      11.0 (L)            06/30/2020                 HCT                      35.0                06/30/2020                 MCV                      89.5                06/30/2020                 PLT                      132 (L)             06/30/2020              Your electrolytes( sodium, Chloride, potassium, calcium) normal  Your Kidney test Normal  Your Fasting Blood glucose is  Normal  Lab Results       Component                Value               Date                       CREAT                    0.7                 06/30/2020                 BUN                      16.0                06/30/2020  NA                       140                 06/30/2020                 K                        4.1                 06/30/2020                 CL                       103                 06/30/2020                 CO2                      28                  06/30/2020              Lab Results       Component                Value               Date                       HGBA1C                   5.2                 06/30/2020            The test we did to check your average sugar s for diabetes is normal      Your Liver test- normal  Lab Results       Component                Value               Date                       ALT                      24                  06/30/2020                 AST                      24                  06/30/2020                  ALKPHOS                  65                  06/30/2020                 BILITOTAL  0.4                 06/30/2020                Thyroid test Normal  Lab Results       Component                Value               Date                       TSH                      0.83                06/30/2020                Lab Results       Component                Value               Date                       VITD                     15 (L)              06/30/2020                Your Vitamin D is low,   normal >30     I  Will send prescription dose  Which is 50000 units once a week for 12 weeks to your pharmacy   I  advise you to take vitamin D over the counter 1000units daily     Urine  normal    Your cholesterol panel is as below, Total cholesterol and LDL Bad cholesterol is  high . You need  to increaase dose of cholesterol pill if you prefer, let me know and  work on   diet and exercise  see recommendation below   Lab Results       Component                Value               Date                       CHOL                     200 (H)             06/30/2020                 CHOL                     166                 07/02/2019                 CHOL                     174                 11/06/2018            Lab Results       Component  Value               Date                       HDL                      53                  06/30/2020                 HDL                      49                  07/02/2019                 HDL                      46                  11/06/2018            Lab Results       Component                Value               Date                       LDL                      125 (H)             06/30/2020                 LDL                      105 (H)             07/02/2019                 LDL                      110 (H)             11/06/2018            Lab Results       Component                Value               Date                       TRIG                      109                 06/30/2020                 TRIG                     62                  07/02/2019                 TRIG  88                  11/06/2018                Your goal cholesterol numbers are:   LDL ("bad cholesterol") <100 in normal  <75 in Diabetic   HDL ("good cholesterol") >40  Triglycerides <150    Increase vegetables, fruits, and fiber in your diet.  Increase unsaturated fats - olive oil, canola oil, nuts, seeds, avocados, fish  Decrease saturated and trans fats - red meat, fried foods, butter, foods with hydrogenated oils.  Decreased processed carbohydrates and sugar - crackers, cookies, pretzels, white bread, white rice, regular pasta, etc.  Exercise at least 30 minutes 5 days per week.    Fish oil or omega-3 supplement (2-4 gm daily) is okay to take and may lower your triglycerides.        Dr Windell Moment MD  Internist  [facility] [place]  [address], [place],  [place] [id number]  [phone]  Fax-[phone]

## 2020-07-02 ENCOUNTER — Ambulatory Visit: Payer: No Typology Code available for payment source

## 2020-07-05 NOTE — Progress Notes (Signed)
Contacted patient and reviewed results. Patient voiced understanding, no further questions.

## 2020-07-06 ENCOUNTER — Encounter (INDEPENDENT_AMBULATORY_CARE_PROVIDER_SITE_OTHER): Payer: Self-pay

## 2020-07-06 ENCOUNTER — Other Ambulatory Visit (INDEPENDENT_AMBULATORY_CARE_PROVIDER_SITE_OTHER): Payer: Self-pay | Admitting: Internal Medicine

## 2020-07-06 ENCOUNTER — Encounter (INDEPENDENT_AMBULATORY_CARE_PROVIDER_SITE_OTHER): Payer: Self-pay | Admitting: Internal Medicine

## 2020-07-06 DIAGNOSIS — D696 Thrombocytopenia, unspecified: Secondary | ICD-10-CM

## 2020-07-07 ENCOUNTER — Encounter (INDEPENDENT_AMBULATORY_CARE_PROVIDER_SITE_OTHER): Payer: No Typology Code available for payment source | Admitting: Internal Medicine

## 2020-07-09 ENCOUNTER — Other Ambulatory Visit (FREE_STANDING_LABORATORY_FACILITY): Payer: No Typology Code available for payment source

## 2020-07-09 DIAGNOSIS — D696 Thrombocytopenia, unspecified: Secondary | ICD-10-CM

## 2020-07-09 LAB — CBC AND DIFFERENTIAL
Absolute NRBC: 0 10*3/uL (ref 0.00–0.00)
Basophils Absolute Automated: 0.06 10*3/uL (ref 0.00–0.08)
Basophils Automated: 1.3 %
Eosinophils Absolute Automated: 0.45 10*3/uL — ABNORMAL HIGH (ref 0.00–0.44)
Eosinophils Automated: 9.9 %
Hematocrit: 41.5 % (ref 34.7–43.7)
Hgb: 12.5 g/dL (ref 11.4–14.8)
Immature Granulocytes Absolute: 0.01 10*3/uL (ref 0.00–0.07)
Immature Granulocytes: 0.2 %
Lymphocytes Absolute Automated: 2.01 10*3/uL (ref 0.42–3.22)
Lymphocytes Automated: 44.4 %
MCH: 28.2 pg (ref 25.1–33.5)
MCHC: 30.1 g/dL — ABNORMAL LOW (ref 31.5–35.8)
MCV: 93.7 fL (ref 78.0–96.0)
MPV: 10 fL (ref 8.9–12.5)
Monocytes Absolute Automated: 0.57 10*3/uL (ref 0.21–0.85)
Monocytes: 12.6 %
Neutrophils Absolute: 1.43 10*3/uL (ref 1.10–6.33)
Neutrophils: 31.6 %
Nucleated RBC: 0 /100 WBC (ref 0.0–0.0)
Platelets: 361 10*3/uL — ABNORMAL HIGH (ref 142–346)
RBC: 4.43 10*6/uL (ref 3.90–5.10)
RDW: 14 % (ref 11–15)
WBC: 4.53 10*3/uL (ref 3.10–9.50)

## 2020-07-13 ENCOUNTER — Telehealth (INDEPENDENT_AMBULATORY_CARE_PROVIDER_SITE_OTHER): Payer: Self-pay | Admitting: Internal Medicine

## 2020-07-13 ENCOUNTER — Encounter (INDEPENDENT_AMBULATORY_CARE_PROVIDER_SITE_OTHER): Payer: Self-pay

## 2020-07-13 NOTE — Telephone Encounter (Signed)
Patient called returning nurse's call. However the nurse is with a patient so patient states to either give her a call or if the nurse wants to send her a message through MyChart.

## 2020-07-15 ENCOUNTER — Ambulatory Visit
Admission: RE | Admit: 2020-07-15 | Discharge: 2020-07-15 | Disposition: A | Discharge status: Home / Self Care | Payer: Self-pay | Source: Ambulatory Visit

## 2020-07-15 ENCOUNTER — Other Ambulatory Visit: Payer: Self-pay

## 2020-07-15 DIAGNOSIS — Z006 Encounter for examination for normal comparison and control in clinical research program: Secondary | ICD-10-CM

## 2020-07-16 ENCOUNTER — Encounter: Payer: Self-pay | Admitting: Orthopaedic Surgery

## 2020-07-16 ENCOUNTER — Ambulatory Visit: Payer: No Typology Code available for payment source | Attending: Orthopaedic Surgery

## 2020-07-16 NOTE — Pre-Procedure Instructions (Signed)
Surgical Risk Level : (Low, Intermediate, High)  o intermediate     Surgeon Testing Requirements:  o Medical clearance, labs, ekg     Anesthesia Guideline Requirements:  o Joint: cbc, t&s, bmp,pt/inr, ekg, ua, mrsa/mssa      Specialist Notes / Test Results / Records Requested:  o PCP clearance in epic Venkatachallam     Recent Hospitalization / ED Visit:   o none     Future Plan / Upcoming Appts:   o none     Labs/Testing @ [facility]:   o none   Bipap/Cpap usage: Encourage to use hospital OSA machine   NA   Email Sent To Patient:   o Provided PSS  phone [phone] to patient  o Emailed copy of hospital brochure and CHG instructions,joint information  o [email or web address]       NPO Instructions given to patient:    o NPO instructions reviewed: Clear liquids up to 2 hours prior to arrival time, then NPO. No solid food 8 hours prior to scheduled procedure time. Examples of clear liquids include water, apple juice, sports drinks such as Gatorade, coffee or tea without milk or cream. Sugar or sweetener may be added  o Fasting Requirements per Preoperative Fasting Guidelines for Elective Surgeries and Procedures Requiring Anesthesia Policy Revised 08/2019.  Ingested material Fasting requirement   Clear liquids/Ice Chips 2 hours prior to arrival time   Breast milk 4 hours prior to scheduled procedure time   Infant formula 6 hours prior to scheduled procedure time   Non-human milk 8 hours prior to scheduled procedure time   Solid food 8 hours prior to scheduled procedure time        Faxes Sent To:   o Pharmacy- DOS medication orders eFAXED to pharmacy @[phone]       Epic Orders Entered:   o Preop Nursing Anesthesia Orders Day of surgery preop order set entered , SCDs     Other Outlying information gathered that does not fit anywhere else  o none     Chart Room Handoff for Further  Follow-up if Applicable:  o none    Visitor Restriction Guidelines per [facility] as of 04/15/20:  All  visitors must adhere to the following:    Exhibit no COVID-19 symptoms    Age 20+ (internally exceptions can be made by administrative team as needed, e.g., siblings, end of life situations, etc.)    In keeping with the CDCs current guidance, regardless of vaccination status, everyone in a healthcare facility must wear a mask covering their mouth and nose the entire time they are in the facility. Visitors who fail to wear a mask properly will be asked to leave.    The following face coverings cannot be worn at any [place] location: gaiter style masks, bandanas or vented masks.    No visitors are allowed for patients with suspected or confirmed COVID-19, except in end-of-life situations.  Hospital Inpatient:   Visitation hours: 9:00 p.m. - 6:30 p.m. daily    Adult patients may have two designated visitor (age 65+)   Pediatric patients may have two parents/guardians at bedside 24/7. For pediatric outpatient areas, only parents/guardians may visit.  Outpatient/Ambulatory Surgery:    Adult patients may have only one designated visitor (age 56+) to accompany the patient   No family members/visitors will be allowed into Phase 1 recovery areas. Physicians will call contact person to give report of procedure.    Family / visitor will be called by  PACU staff  to review discharge instructions via phone and answer any questions.     Advise to call surgeon if need to cancel surgery arises.      Patient verbalized understanding and acceptance of above information.

## 2020-07-19 ENCOUNTER — Other Ambulatory Visit (INDEPENDENT_AMBULATORY_CARE_PROVIDER_SITE_OTHER): Payer: Self-pay | Admitting: Internal Medicine

## 2020-07-19 DIAGNOSIS — R7989 Other specified abnormal findings of blood chemistry: Secondary | ICD-10-CM

## 2020-07-19 NOTE — Telephone Encounter (Signed)
Name, strength, directions of requested refill(s):    -ergocalciferol (Drisdol) 1.25 MG (50000 UT) capsule    Pharmacy to send refill to or patient to pick up rx from office (mark requested pharmacy in BOLD):      [facility] [place], [place] - [address]  [address]  [place] [address]  Phone: [phone] Fax: [phone]    [facility] [address] [place] - [address]  [address]  [place] [address]  Phone: [phone] Fax: [phone]        Please mark "X" next to the preferred call back number:    Mobile:   Telephone Information:   Mobile [phone]       Home: @HOMEPHONE @    Work: @WORKPHONE @        Medication refill request , see above. Thank you     Additional Notes:     Next Visit:

## 2020-07-19 NOTE — Telephone Encounter (Signed)
lov 06/30/2020

## 2020-07-20 ENCOUNTER — Telehealth (INDEPENDENT_AMBULATORY_CARE_PROVIDER_SITE_OTHER): Payer: Self-pay | Admitting: Internal Medicine

## 2020-07-20 MED ORDER — ERGOCALCIFEROL 1.25 MG (50000 UT) PO CAPS
50000.0000 [IU] | ORAL_CAPSULE | ORAL | 0 refills | Status: DC
Start: 2020-07-20 — End: 2021-02-02

## 2020-07-20 NOTE — Telephone Encounter (Signed)
[facility] called and requested to please fax again the EKG with the tracing graphic of the patient. The fax number is [phone].

## 2020-07-20 NOTE — Telephone Encounter (Signed)
Faxed per request.

## 2020-07-26 ENCOUNTER — Encounter (INDEPENDENT_AMBULATORY_CARE_PROVIDER_SITE_OTHER): Payer: Self-pay

## 2020-08-02 NOTE — Anesthesia Preprocedure Evaluation (Addendum)
Anesthesia Evaluation    AIRWAY    Mallampati: II    TM distance: >3 FB  Neck ROM: full  Mouth Opening:full  Planned to use difficult airway equipment: No CARDIOVASCULAR    cardiovascular exam normal       DENTAL    no notable dental hx     PULMONARY    pulmonary exam normal     OTHER FINDINGS                  Relevant Problems   No relevant active problems       PSS Anesthesia Comments: HTN, HLD; Labs-WNL; EKG-SR; CXR-no acute process; Medical clearance-Dr. 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  Also dry eyes bilaterally.  Patient had already opened preservative free drops and explained we would use that and possibly also lacrilube (will check pres-free status per patient).   Spinal, peripheral nerve block and IV sedation discussed with patient.)      intravenous induction   Detailed anesthesia plan: spinal and PNB        Post op pain management: PNB single shot    informed consent obtained    Plan discussed with CRNA.      pertinent labs reviewed             Signed by: Landry Dyke, NP 08/02/20 11:09 AM

## 2020-08-02 NOTE — Progress Notes (Signed)
Spoke to [employer]  re: abx instead of ancef due to pcn allergy. States will talk to Dr Laveda Norman and reorder another abx.

## 2020-08-02 NOTE — Progress Notes (Signed)
Day Before Surgery Confirmation Call    Spoke to:  Left Message:pt    Confirmed surgery date, arrival time, and location.   Arrival: 1030  Surgery: 1230    NPO instructions reviewed: Clear liquids up to 2 hours prior to arrival time, then NPO. No solid food 8 hours prior to scheduled procedure time. Examples of clear liquids include water, apple juice, sports drinks such as Gatorade, coffee or tea without milk or cream. Sugar or sweetener may be added    Fasting Requirements per Preoperative Fasting Guidelines for Elective Surgeries and Procedures Requiring Anesthesia Policy Revised 08/2019  Ingested material Fasting requirement   Clear liquids/Ice Chips 2 hours prior to arrival time   Breast milk 4 hours prior to scheduled procedure time   Infant formula 6 hours prior to scheduled procedure time   Non-human milk 8 hours prior to scheduled procedure time   Solid food 8 hours prior to scheduled procedure time       Ambulatory Screening Tool:   Patient instructed that if you or your family does develop new symptoms of acute respiratory illness, to contact surgeons office immediately, prior to arrival at the hospital.     Patient denies recent visit to ED, hospitalization or PCP visit for acute illness since PSS RN interview.     Visitor Restriction Guidelines per [facility] Policy as of 07/02/20:  All visitors must adhere to the following:   • Exhibit no COVID-19 symptoms   • Age 16+   • In keeping with the CDC’s current guidance, regardless of vaccination status, everyone in a healthcare facility must wear a mask covering their mouth and nose the entire time they are in the facility. Visitors who fail to wear a mask properly will be asked to leave. The following face coverings cannot be worn at any [place] location: gaiter style masks, bandanas or vented masks.   • No visitors are allowed for patients with suspected or confirmed COVID-19, except in end-of-life situations.  Hospital Inpatient:   Visitation hours: 9:00  a.m. - 6:30 p.m. daily   • Adult patients may have two (age 16+) per day.  • Pediatric patients may have two parents/guardians at bedside 24/7.   Outpatient/Ambulatory Surgery:   • Adult patients may have only one designated visitor (age 16+) to accompany the patient  • For pediatric outpatients, only parents/guardians at bedside.   • No family members/visitors will be allowed into Phase 1 recovery areas. Physicians will call contact person to give report of procedure.   • Family / visitor will be called by PACU staff  to review discharge instructions via phone and answer any questions.     Advise to call surgeon if need to cancel surgery arises.     Patient verbalized understanding and acceptance of above information.  If a voicemail is left for the patient and any of the COVID19 ambulatory screening questions are positive, patient is asked to call PSS ASAP.

## 2020-08-03 ENCOUNTER — Encounter
Admission: RE | Disposition: A | Discharge status: Home / Self Care | Payer: Self-pay | Source: Ambulatory Visit | Attending: Orthopaedic Surgery

## 2020-08-03 ENCOUNTER — Ambulatory Visit: Payer: No Typology Code available for payment source | Admitting: Acute Care

## 2020-08-03 ENCOUNTER — Observation Stay
Admission: RE | Admit: 2020-08-03 | Discharge: 2020-08-05 | Disposition: A | Discharge status: Home / Self Care | Payer: No Typology Code available for payment source | Source: Ambulatory Visit | Attending: Orthopaedic Surgery | Admitting: Orthopaedic Surgery

## 2020-08-03 ENCOUNTER — Encounter (INDEPENDENT_AMBULATORY_CARE_PROVIDER_SITE_OTHER): Payer: Self-pay

## 2020-08-03 ENCOUNTER — Observation Stay: Payer: No Typology Code available for payment source

## 2020-08-03 ENCOUNTER — Encounter: Payer: Self-pay | Admitting: Orthopaedic Surgery

## 2020-08-03 ENCOUNTER — Ambulatory Visit: Payer: Self-pay

## 2020-08-03 DIAGNOSIS — Z79899 Other long term (current) drug therapy: Secondary | ICD-10-CM | POA: Insufficient documentation

## 2020-08-03 DIAGNOSIS — Z961 Presence of intraocular lens: Secondary | ICD-10-CM | POA: Insufficient documentation

## 2020-08-03 DIAGNOSIS — G4700 Insomnia, unspecified: Secondary | ICD-10-CM | POA: Insufficient documentation

## 2020-08-03 DIAGNOSIS — M769 Unspecified enthesopathy, lower limb, excluding foot: Secondary | ICD-10-CM | POA: Insufficient documentation

## 2020-08-03 DIAGNOSIS — E785 Hyperlipidemia, unspecified: Secondary | ICD-10-CM | POA: Insufficient documentation

## 2020-08-03 DIAGNOSIS — G8918 Other acute postprocedural pain: Secondary | ICD-10-CM | POA: Insufficient documentation

## 2020-08-03 DIAGNOSIS — I10 Essential (primary) hypertension: Secondary | ICD-10-CM | POA: Insufficient documentation

## 2020-08-03 DIAGNOSIS — M810 Age-related osteoporosis without current pathological fracture: Secondary | ICD-10-CM | POA: Insufficient documentation

## 2020-08-03 DIAGNOSIS — M21062 Valgus deformity, not elsewhere classified, left knee: Secondary | ICD-10-CM | POA: Insufficient documentation

## 2020-08-03 DIAGNOSIS — M5430 Sciatica, unspecified side: Secondary | ICD-10-CM | POA: Insufficient documentation

## 2020-08-03 DIAGNOSIS — M25562 Pain in left knee: Secondary | ICD-10-CM

## 2020-08-03 DIAGNOSIS — M1712 Unilateral primary osteoarthritis, left knee: Principal | ICD-10-CM | POA: Insufficient documentation

## 2020-08-03 HISTORY — PX: KNEE SURGERY: SHX244

## 2020-08-03 HISTORY — PX: ARTHROPLASTY, KNEE, TOTAL MAKOPLASTY: SHX9536

## 2020-08-03 SURGERY — ARTHROPLASTY, TOTAL, KNEE MAKOPLASTY
Anesthesia: Regional | Site: Knee | Laterality: Left | Wound class: Clean

## 2020-08-03 MED ORDER — SODIUM CHLORIDE 0.9 % IR SOLN
Status: DC | PRN
Start: 2020-08-03 — End: 2020-08-03
  Administered 2020-08-03: 1000 mL

## 2020-08-03 MED ORDER — MIDAZOLAM HCL 1 MG/ML IJ SOLN (WRAP)
INTRAMUSCULAR | Status: AC
Start: 2020-08-03 — End: ?
  Filled 2020-08-03: qty 2

## 2020-08-03 MED ORDER — TRAMADOL HCL 50 MG PO TABS
50.0000 mg | ORAL_TABLET | Freq: Four times a day (QID) | ORAL | Status: AC
Start: 2020-08-03 — End: 2020-08-04
  Administered 2020-08-04 (×3): 50 mg via ORAL
  Filled 2020-08-03 (×4): qty 1

## 2020-08-03 MED ORDER — LACTATED RINGERS IV SOLN
INTRAVENOUS | Status: DC
Start: 2020-08-03 — End: 2020-08-05
  Administered 2020-08-03: 14:00:00 3000 mL via INTRAVENOUS

## 2020-08-03 MED ORDER — CEFAZOLIN SODIUM 1 G IJ SOLR
INTRAMUSCULAR | Status: DC | PRN
Start: 2020-08-03 — End: 2020-08-03
  Administered 2020-08-03: 2 g via INTRAVENOUS

## 2020-08-03 MED ORDER — MIDAZOLAM HCL 1 MG/ML IJ SOLN (WRAP)
2.0000 mg | Freq: Once | INTRAMUSCULAR | Status: DC
Start: 2020-08-03 — End: 2020-08-03

## 2020-08-03 MED ORDER — ACETAZOLAMIDE 250 MG PO TABS
125.0000 mg | ORAL_TABLET | Freq: Three times a day (TID) | ORAL | Status: DC
Start: 2020-08-03 — End: 2020-08-03

## 2020-08-03 MED ORDER — ONDANSETRON HCL 4 MG/2ML IJ SOLN
4.0000 mg | Freq: Three times a day (TID) | INTRAMUSCULAR | Status: DC | PRN
Start: 2020-08-03 — End: 2020-08-05

## 2020-08-03 MED ORDER — SODIUM CHLORIDE (PF) 0.9 % IJ SOLN
3.0000 mL | Freq: Three times a day (TID) | INTRAMUSCULAR | Status: DC
Start: 2020-08-03 — End: 2020-08-05
  Administered 2020-08-04 – 2020-08-05 (×2): 3 mL via INTRAVENOUS

## 2020-08-03 MED ORDER — BUPIVACAINE LIPOSOME 1.3 % IJ SUSP
INTRAMUSCULAR | Status: DC | PRN
Start: 2020-08-03 — End: 2020-08-03
  Administered 2020-08-03 (×2): 10 mL

## 2020-08-03 MED ORDER — PROMETHAZINE HCL 25 MG/ML IJ SOLN
6.2500 mg | Freq: Once | INTRAMUSCULAR | Status: DC | PRN
Start: 2020-08-03 — End: 2020-08-03

## 2020-08-03 MED ORDER — ONDANSETRON 4 MG PO TBDP
4.0000 mg | ORAL_TABLET | Freq: Three times a day (TID) | ORAL | Status: DC | PRN
Start: 2020-08-03 — End: 2020-08-05

## 2020-08-03 MED ORDER — ONDANSETRON HCL 4 MG/2ML IJ SOLN
4.0000 mg | Freq: Once | INTRAMUSCULAR | Status: DC | PRN
Start: 2020-08-03 — End: 2020-08-03

## 2020-08-03 MED ORDER — DEXTROSE 5 % IV SOLN
2.0000 g | Freq: Three times a day (TID) | INTRAVENOUS | Status: AC
Start: 2020-08-03 — End: 2020-08-04
  Administered 2020-08-03 – 2020-08-04 (×2): 2 g via INTRAVENOUS
  Filled 2020-08-03 (×2): qty 2000

## 2020-08-03 MED ORDER — TRANEXAMIC ACID-NACL 1000-0.7 MG/100ML-% IV SOLN
INTRAVENOUS | Status: AC
Start: 2020-08-03 — End: ?
  Filled 2020-08-03: qty 100

## 2020-08-03 MED ORDER — OXYCODONE-ACETAMINOPHEN 5-325 MG PO TABS
1.0000 | ORAL_TABLET | ORAL | Status: DC | PRN
Start: 2020-08-03 — End: 2020-08-05
  Administered 2020-08-04 – 2020-08-05 (×6): 1 via ORAL
  Filled 2020-08-03 (×5): qty 1

## 2020-08-03 MED ORDER — LUBRIFRESH P.M. OP OINT
TOPICAL_OINTMENT | OPHTHALMIC | Status: AC
Start: 2020-08-03 — End: ?
  Filled 2020-08-03: qty 3.5

## 2020-08-03 MED ORDER — OXYCODONE-ACETAMINOPHEN 5-325 MG PO TABS
1.0000 | ORAL_TABLET | Freq: Once | ORAL | Status: AC | PRN
Start: 2020-08-03 — End: 2020-08-03
  Administered 2020-08-03: 1 via ORAL
  Filled 2020-08-03: qty 1

## 2020-08-03 MED ORDER — LISINOPRIL 20 MG PO TABS
20.0000 mg | ORAL_TABLET | Freq: Every day | ORAL | Status: DC
Start: 2020-08-03 — End: 2020-08-05
  Administered 2020-08-03 – 2020-08-05 (×3): 20 mg via ORAL
  Filled 2020-08-03 (×3): qty 1

## 2020-08-03 MED ORDER — NALOXONE HCL 0.4 MG/ML IJ SOLN (WRAP)
0.4000 mg | INTRAMUSCULAR | Status: DC | PRN
Start: 2020-08-03 — End: 2020-08-05

## 2020-08-03 MED ORDER — FENTANYL CITRATE (PF) 50 MCG/ML IJ SOLN (WRAP)
INTRAMUSCULAR | Status: AC
Start: 2020-08-03 — End: ?
  Filled 2020-08-03: qty 2

## 2020-08-03 MED ORDER — FENTANYL CITRATE (PF) 50 MCG/ML IJ SOLN (WRAP)
25.0000 ug | INTRAMUSCULAR | Status: DC | PRN
Start: 2020-08-03 — End: 2020-08-03

## 2020-08-03 MED ORDER — BUPIVACAINE HCL (PF) 0.75 % IJ SOLN
INTRAMUSCULAR | Status: DC | PRN
Start: 2020-08-03 — End: 2020-08-03
  Administered 2020-08-03: 1.6 mL via INTRASPINAL

## 2020-08-03 MED ORDER — HYDROCHLOROTHIAZIDE 12.5 MG PO TABS
12.5000 mg | ORAL_TABLET | Freq: Every day | ORAL | Status: DC
Start: 2020-08-03 — End: 2020-08-05
  Administered 2020-08-03 – 2020-08-05 (×3): 12.5 mg via ORAL
  Filled 2020-08-03 (×3): qty 1

## 2020-08-03 MED ORDER — METOCLOPRAMIDE HCL 5 MG/ML IJ SOLN
5.0000 mg | Freq: Four times a day (QID) | INTRAMUSCULAR | Status: DC | PRN
Start: 2020-08-03 — End: 2020-08-05
  Administered 2020-08-03: 19:00:00 5 mg via INTRAVENOUS
  Filled 2020-08-03: qty 2

## 2020-08-03 MED ORDER — ACETAMINOPHEN 500 MG PO TABS
500.0000 mg | ORAL_TABLET | Freq: Once | ORAL | Status: DC | PRN
Start: 2020-08-03 — End: 2020-08-03

## 2020-08-03 MED ORDER — SODIUM CHLORIDE 0.9 % IV SOLN
INTRAVENOUS | Status: DC
Start: 2020-08-03 — End: 2020-08-05

## 2020-08-03 MED ORDER — HYDROMORPHONE HCL 2 MG PO TABS
2.0000 mg | ORAL_TABLET | ORAL | Status: DC | PRN
Start: 2020-08-03 — End: 2020-08-05
  Administered 2020-08-04: 2 mg via ORAL
  Filled 2020-08-03: qty 1

## 2020-08-03 MED ORDER — BUPIVACAINE HCL (PF) 0.75 % IJ SOLN
INTRAMUSCULAR | Status: DC | PRN
Start: 2020-08-03 — End: 2020-08-03

## 2020-08-03 MED ORDER — LACTATED RINGERS IR SOLN
Status: AC | PRN
Start: 2020-08-03 — End: 2020-08-03
  Administered 2020-08-03: 3000 mL

## 2020-08-03 MED ORDER — DIPHENHYDRAMINE HCL 25 MG PO CAPS
25.0000 mg | ORAL_CAPSULE | Freq: Two times a day (BID) | ORAL | Status: DC | PRN
Start: 2020-08-03 — End: 2020-08-05

## 2020-08-03 MED ORDER — BUPIVACAINE HCL (PF) 0.25 % IJ SOLN
INTRAMUSCULAR | Status: AC
Start: 2020-08-03 — End: ?
  Filled 2020-08-03: qty 30

## 2020-08-03 MED ORDER — FENTANYL CITRATE (PF) 50 MCG/ML IJ SOLN (WRAP)
INTRAMUSCULAR | Status: DC | PRN
Start: 2020-08-03 — End: 2020-08-03
  Administered 2020-08-03 (×4): 25 ug via INTRAVENOUS

## 2020-08-03 MED ORDER — CEFAZOLIN SODIUM 1 G IJ SOLR
INTRAMUSCULAR | Status: AC
Start: 2020-08-03 — End: ?
  Filled 2020-08-03: qty 2000

## 2020-08-03 MED ORDER — BUPIVACAINE HCL (PF) 0.25 % IJ SOLN
INTRAMUSCULAR | Status: DC | PRN
Start: 2020-08-03 — End: 2020-08-03
  Administered 2020-08-03 (×2): 15 mL via EPIDURAL

## 2020-08-03 MED ORDER — TRANEXAMIC ACID 1000 MG/10ML IV SOLN
INTRAVENOUS | Status: DC | PRN
Start: 2020-08-03 — End: 2020-08-03
  Administered 2020-08-03: 13:00:00 1000 mg/h via INTRAVENOUS

## 2020-08-03 MED ORDER — PROPOFOL INFUSION 10 MG/ML
INTRAVENOUS | Status: DC | PRN
Start: 2020-08-03 — End: 2020-08-03
  Administered 2020-08-03: 100 ug/kg/min via INTRAVENOUS

## 2020-08-03 MED ORDER — LIDOCAINE 4 % EX CREA
TOPICAL_CREAM | Freq: Once | CUTANEOUS | Status: DC | PRN
Start: 2020-08-03 — End: 2020-08-03

## 2020-08-03 MED ORDER — METOCLOPRAMIDE HCL 5 MG PO TABS
5.0000 mg | ORAL_TABLET | Freq: Four times a day (QID) | ORAL | Status: DC | PRN
Start: 2020-08-03 — End: 2020-08-05

## 2020-08-03 MED ORDER — MEPERIDINE HCL 25 MG/ML IJ SOLN
12.5000 mg | Freq: Once | INTRAMUSCULAR | Status: DC | PRN
Start: 2020-08-03 — End: 2020-08-03

## 2020-08-03 MED ORDER — MIDAZOLAM HCL 1 MG/ML IJ SOLN (WRAP)
INTRAMUSCULAR | Status: DC | PRN
Start: 2020-08-03 — End: 2020-08-03
  Administered 2020-08-03 (×2): 1 mg via INTRAVENOUS

## 2020-08-03 MED ORDER — ASPIRIN 325 MG PO TBEC
325.0000 mg | DELAYED_RELEASE_TABLET | Freq: Two times a day (BID) | ORAL | Status: DC
Start: 2020-08-03 — End: 2020-08-05
  Administered 2020-08-03 – 2020-08-05 (×4): 325 mg via ORAL
  Filled 2020-08-03 (×4): qty 1

## 2020-08-03 MED ORDER — FERROUS SULFATE 324 (65 FE) MG PO TBEC
DELAYED_RELEASE_TABLET | Freq: Every day | ORAL | Status: DC
Start: 2020-08-03 — End: 2020-08-05
  Filled 2020-08-03 (×3): qty 1

## 2020-08-03 MED ORDER — HYDROMORPHONE HCL 0.5 MG/0.5 ML IJ SOLN
0.5000 mg | INTRAMUSCULAR | Status: DC | PRN
Start: 2020-08-03 — End: 2020-08-03

## 2020-08-03 MED ORDER — AMMONIA AROMATIC IN INHA
1.0000 | Freq: Once | RESPIRATORY_TRACT | Status: DC | PRN
Start: 2020-08-03 — End: 2020-08-03

## 2020-08-03 MED ORDER — PREDNISOLONE ACETATE 1 % OP SUSP
1.0000 [drp] | Freq: Four times a day (QID) | OPHTHALMIC | Status: DC
Start: 2020-08-03 — End: 2020-08-05
  Administered 2020-08-04: 09:00:00 1 [drp] via OPHTHALMIC
  Filled 2020-08-03 (×3): qty 5

## 2020-08-03 MED ORDER — LIDOCAINE HCL 1 % IJ SOLN
1.0000 mL | Freq: Once | INTRAMUSCULAR | Status: DC | PRN
Start: 2020-08-03 — End: 2020-08-03

## 2020-08-03 MED ORDER — DOCUSATE SODIUM 100 MG PO CAPS
100.0000 mg | ORAL_CAPSULE | Freq: Two times a day (BID) | ORAL | Status: DC
Start: 2020-08-03 — End: 2020-08-05
  Administered 2020-08-04 – 2020-08-05 (×2): 100 mg via ORAL
  Filled 2020-08-03 (×4): qty 1

## 2020-08-03 MED ORDER — BISACODYL 10 MG RE SUPP
10.0000 mg | Freq: Every day | RECTAL | Status: DC | PRN
Start: 2020-08-03 — End: 2020-08-05

## 2020-08-03 MED ORDER — HYDROCODONE-ACETAMINOPHEN 5-325 MG PO TABS
1.0000 | ORAL_TABLET | ORAL | Status: DC | PRN
Start: 2020-08-03 — End: 2020-08-04
  Administered 2020-08-03: 1 via ORAL
  Filled 2020-08-03: qty 1

## 2020-08-03 MED ORDER — VANCOMYCIN HCL IN NACL 1.5-0.9 GM/500ML-% IV SOLN
1500.0000 mg | INTRAVENOUS | Status: DC
Start: 2020-08-03 — End: 2020-08-03

## 2020-08-03 MED ORDER — BUPIVACAINE LIPOSOME 1.3 % IJ SUSP
INTRAMUSCULAR | Status: AC
Start: 2020-08-03 — End: ?
  Filled 2020-08-03: qty 20

## 2020-08-03 SURGICAL SUPPLY — 208 items
ADHESIVE SKIN CLOSURE DERMABOND ADVANCED (Skin Closure) ×1 IMPLANT
ADHESIVE SKIN CLOSURE DERMABOND ADVANCED .7 ML LIQUID APPLICATOR (Skin Closure) ×1 IMPLANT
ADHESIVE SKIN DERMABOND ADV (Skin Closure) ×1
ADHESIVE SKNCLS 2 OCTYL CYNCRLT .7ML (Skin Closure) ×1
APPLICATOR CHLORAPREP 26 ML 70% ISOPROPYL ALCOHOL 2% CHLORHEXIDINE (Applicator) ×1 IMPLANT
APPLICATOR PRP 70% ISPRP 2% CHG 26ML (Applicator) ×1 IMPLANT
AQUACEL AG DRESSING 3.5X9.75 (Dressing) ×1
BANDAGE ADHESIVE L3 3/4 IN X W2 IN (Bandage) ×2 IMPLANT
BANDAGE ADHESIVE L3 3/4 IN X W2 IN STRETCH FLEXIBLE PAD XL CURITY (Bandage) ×2 IMPLANT
BANDAGE ADHV XLG FLEX 2X3 3/4 (Bandage) ×4
BANDAGE CMPR PLSTR CTTN SFWRP 5YDX4IN LF (Procedure Accessories) ×1
BANDAGE CMPR PLSTR CTTN SFWRP 5YDX6IN LF (Procedure Accessories) ×1
BANDAGE COMPRESSION L5 YD X W4 IN ELASTIC CLIP CLOSURE STRETCH (Procedure Accessories) ×1 IMPLANT
BANDAGE COMPRESSION L5 YD X W6 IN ELASTIC CLIP CLOSURE POLYESTER (Procedure Accessories) ×1 IMPLANT
BANDAGE MEDLINE COMPRESSION L5 YD X W4 (Procedure Accessories) ×1 IMPLANT
BANDAGE MEDLINE COMPRESSION L5 YD X W6 (Procedure Accessories) ×1 IMPLANT
BASEPLATE TIB 4 TRTHLN STRL CMNT PRM KN (Component) ×1 IMPLANT
BASEPLATE TIBIAL 4 KNEE CEMENT PRIMARY (Component) ×1 IMPLANT
BASEPLATE TIBIAL 4 KNEE CEMENT PRIMARY TRIATHLON (Component) ×1 IMPLANT
BASEPLT TRIATH PRIM TIB SZ4 (Component) ×1 IMPLANT
BDG ELSTC SFTWRP STL 4X5YD (Procedure Accessories) ×1
BDG ELSTC SFTWRP STL 6X5YD (Procedure Accessories) ×1
BLADE 11 BARD-PARKER RIB-BACK CARBON (Blade) IMPLANT
BLADE 11 BARD-PARKER RIB-BACK CARBON STEEL TISSUE SURGICAL (Blade) IMPLANT
BLADE NARROW SAGITTAL MICS MAKO SURGICAL (Blade) IMPLANT
BLADE NARROW SAGITTAL MICS SURGICAL (Blade) IMPLANT
BLADE S/SU RIBBACK CARB STL 11 (Blade)
BLADE SAGITTAL MAKO NARROW (Blade)
BLADE SAGITTAL MAKO STANDARD (Blade) ×1
BLADE SAW 25MM X 105MML (Blade) ×1
BLADE SAW CARTRIDGE OSCILLATE TIP (Blade) ×1 IMPLANT
BLADE SAW CARTRIDGE OSCILLATE TIP PRECISION FALCON (Blade) ×1 IMPLANT
BLADE SAW SAG 29X58X0.64MM (Blade)
BLADE SAW STANDARD MAKO (Blade) ×1 IMPLANT
BLADE SAW THK.64 MM SHORT NARROW THIN (Blade) IMPLANT
BLADE SAW THK.64 MM SHORT NARROW THIN SAGITTAL NA L28.9 MM X W1.02 MM (Blade) IMPLANT
BLADE SRG CBNSTL 11 BP RB-BCK LF STRL (Blade)
BLADE SRG NAR STRL NAR SGTL MICS DISP (Blade)
BLADE SW PRCS FLCN LF STRL CRTDG (Blade) ×1
BLADE SW SS THK.64MM SHRT NAR THN 28.9 (Blade)
BLADE SW STD MAKO LF STRL DISP (Blade) ×1
BRACE KNEE TRI-PANEL 19IN (Immobilizer) ×1
CEMENT BN TBR SMPX P STRL RADOPQ FD (Cement) ×2 IMPLANT
CEMENT BONE RADIOPAQUE FULL DOSE SIMPLEX (Cement) ×2 IMPLANT
CEMENT BONE RADIOPAQUE FULL DOSE SIMPLEX P TOBRAMYCIN (Cement) ×2 IMPLANT
CEMENT SIMPLEX P TOBRA 1 PAK (Cement) ×2 IMPLANT
CHLORAPREP APLCTR ORANGE 26ML (Applicator) ×1
COMP TRIATH FMRL SZ3 CR L (Component) ×1 IMPLANT
COMPONENT FEM 3 TRTHLN LF STRL CRCTE RTN (Component) ×1 IMPLANT
COMPONENT FEMORAL 3 KNEE LEFT CRUCIATE RETAIN CEMENT TRIATHLON (Component) ×1 IMPLANT
COMPONENT FEMORAL 3 KNEE LT CRUCIATE (Component) ×1 IMPLANT
COMPONENT PATELLAR H9 MM OD29 MM (Patella) ×1 IMPLANT
COMPONENT PATELLAR H9 MM OD29 MM ASYMMETRIC TRIATHLON X3 KNEE (Patella) ×1 IMPLANT
CORD RETRACTION NA SILICON (Retractor) ×1 IMPLANT
CORD RETRACTION SILICON (Retractor) ×1
CORD RETRACTION SILICON MIN 10 QTY (Retractor) ×1 IMPLANT
CORD RTRCT SIL (Retractor) ×1
COVER CAM LF STRL CLR LEN (Procedure Accessories) ×1 IMPLANT
COVER CAMERA CLEAR LENS STERILE LATEX FREE (Procedure Accessories) ×1 IMPLANT
COVER CAMERA OR STERILE (Procedure Accessories) ×1
CUFF TRNQT CYL CLR CUF 30X4IN LF STRL 2 (Other) ×1 IMPLANT
DEPRESSOR TNG WD 6IN LF STRL BLDE SMTH (Procedure Accessories) ×1
DEPRESSOR TONGUE L6 IN BLADE SMOOTH (Procedure Accessories) ×1 IMPLANT
DEPRESSOR TONGUE L6 IN BLADE SMOOTH SPLINTER FREE MEDLINE WOOD (Procedure Accessories) ×1 IMPLANT
DEPRESSOR TONGUE STRL 6IN (Procedure Accessories) ×1
DRAIN HEMOVAC MD/LG 3/16 400ML (Drain) ×1
DRAPE KIT ONE PIECE W/POCKET (Drape) ×1
DRAPE SPAC STATN SURG 30X72IN (Drape) ×1
DRAPE SRG PLS U STRDRP 51X47IN LF STRL (Drape) ×1
DRAPE SRG SMS .75 PRXM 77X53IN LF STRL (Drape) ×1
DRAPE STERI U-DRAPE 47X51IN (Drape) ×1
DRAPE SURG 3/4 53X77IN (Drape) ×1
DRAPE SURGICAL ADHESIVE L51 IN X W47 IN (Drape) ×1 IMPLANT
DRAPE SURGICAL ADHESIVE L51 IN X W47 IN STERI-DRAPE CLEAR (Drape) ×1 IMPLANT
DRAPE SURGICAL SHEET L77 IN X W53 IN (Drape) ×1 IMPLANT
DRAPE SURGICAL SHEET L77 IN X W53 IN MEDLINE SMS 3/4 BLUE (Drape) ×1 IMPLANT
DRAPE TABLE 1 PIECE COVER BARRIER (Drape) ×1 IMPLANT
DRAPE TABLE 1 PIECE COVER BARRIER PLASTIC PEDIGO PRODUCTS, INC. CLEAR (Drape) ×1 IMPLANT
DRAPE TBL PLS STRL 1 PC CVR BRR DISP CLR (Drape) ×1
DRESSING FM STD MPLX BR AG 14X4IN STRL (Dressing) ×1
DRESSING GRMCDL HDRFB IONIC SLVR PU AQCL (Dressing) ×1 IMPLANT
DRESSING MEPILEX BDR AG 4X14IN (Dressing) ×1
DRESSING PETRO 3% BI 3BRM GZE XR 8X1IN (Dressing) ×1
DRESSING PETROLATUM XEROFORM L8 IN X W1 (Dressing) ×1 IMPLANT
DRESSING PETROLATUM XEROFORM L8 IN X W1 IN 3% BISMUTH TRIBROMOPHENATE (Dressing) ×1 IMPLANT
DRESSING STANDARD L14 IN X W4 IN MEPILEX (Dressing) ×1 IMPLANT
DRESSING STANDARD L14 IN X W4 IN MEPILEX BORDER AG FOAM POSTOPERATIVE (Dressing) ×1 IMPLANT
DRESSING TEGADERM FRAME 6X7CM (Dressing) ×1
DRESSING TRANSPARENT L2 3/4 IN X W2 3/8 (Dressing) ×1 IMPLANT
DRESSING TRANSPARENT L2 3/4 IN X W2 3/8 IN POLYURETHANE ADHESIVE (Dressing) ×1 IMPLANT
DRESSING TRNS PU STD TGDRM 2.75INX2 3/8 (Dressing) ×1
DRESSING XEROFORM 1X8 (Dressing) ×1
GLOVE BIOGEL SURG PF SZ 8.5 (Glove) ×1
GLOVE SRG 8.5 BGL OPTIFIT ORTH LTX STRL (Glove) ×2
GLOVE SRG NTR RBR 8.5 BGL SRG LTX STRL (Glove) ×1
GLOVE SURG BIOGEL ORTHO SZ8.5 (Glove) ×2
GLOVE SURGICAL 8 1/2 BIOGEL SURGEONS (Glove) ×1 IMPLANT
GLOVE SURGICAL 8 1/2 BIOGEL SURGEONS POWDER FREE BEAD CUFF TEXTURE (Glove) ×1 IMPLANT
GLOVE SURGICAL 8.5 BIOGEL OPTIFIT POWDER (Glove) ×2 IMPLANT
GLOVE SURGICAL 8.5 BIOGEL OPTIFIT POWDER FREE ROUGH BEAD CUFF (Glove) ×2 IMPLANT
HANDLE LGHT ASPN LF STRL SNPON (Procedure Accessories) ×1
HANDLE LIGHT ALC PLUS DISPOSABLE STERILE (Procedure Accessories) ×1
HANDLE LIGHT SNAP ON ASPEN (Procedure Accessories) ×1 IMPLANT
HANDPIECE INTERPLUSE HIGH FLOW (Cautery) ×4
IMMOBILIZER KNEE UNIVERSAL L19 IN CANVAS (Immobilizer) ×1 IMPLANT
IMMOBILIZER KNEE UNIVERSAL L19 IN CANVAS ORTHOPEDIC DEROYAL OD12-24 IN (Immobilizer) ×1 IMPLANT
IMMOBILIZER ORTH CNVS UNV 12-24IN 19IN (Immobilizer) ×1
INSERT TIB X3 4 TRTHLN 9MM LF STRL CNDRL (Insert) ×1 IMPLANT
INSERT TIBIAL 4 TRIATHLON H9 MM KNEE (Insert) ×1 IMPLANT
INSERT TIBIAL 4 TRIATHLON H9 MM KNEE CONDYLAR STABILIZE BEARING X3 (Insert) ×1 IMPLANT
INSERT TRIATH X3 CS SZ4 9MM (Insert) ×1 IMPLANT
KIT CHECKPOINT MAKOPLSTY STRL (Kits) ×1
KIT DRAINAGE L12.5 IN ROUND 3 SPRING (Drain) ×1 IMPLANT
KIT DRAINAGE L12.5 IN ROUND 3 SPRING EVACUATOR Y CONNECTOR TUBE HOLE (Drain) ×1 IMPLANT
KIT DRAPE 1 PIECE POCKET RIO RIO (Drape) ×1 IMPLANT
KIT DRN PVC 400CC RND 3/16IN 12.5IN LF (Drain) ×1
KIT DRP RIO 1 PC PCKT RIO (Drape) ×1
KIT INFECTION CONTROL CUSTOM (Kits) ×2 IMPLANT
KIT INFECTION CONTROL CUSTOM IFOH03 (Kits) ×1 IMPLANT
KIT KNEE TRACKING (Kits) ×1
MANIFOLD NEPTUNE II 4PORT SUCT (Filter) ×1
MANIFOLD SCT 2 STD NPTN 2 LF NS 4 PORT (Filter) ×1
MANIFOLD SUCTION 2 STANDARD 4 PORT (Filter) ×1 IMPLANT
MANIFOLD SUCTION 2 STANDARD 4 PORT NEPTUNE 2 WASTE MANAGEMENT SYSTEM (Filter) ×1 IMPLANT
MARKER SRG CKPNT LF STRL KT DISP (Kits) ×1
MARKER SURGICAL CHECKPOINT KIT (Kits) ×1 IMPLANT
MIXER BN CMNT MXVC3 LF STRL DISP (Other) ×1
MIXER BONE CEMENT MIXEVAC III (Other) ×1 IMPLANT
MIXER CEMENT BONE MIXEVAC3 (Other) ×1
NEEDLE SPINAL BD OD18 GA L3 1/2 IN (Needles) ×1 IMPLANT
NEEDLE SPINAL DISP 18GX3.5IN (Needles) ×1
NEEDLE SPINAL L3 1/2 IN REGULAR WALL QUINCKE TIP OD18 GA BD (Needles) ×1 IMPLANT
NEEDLE SPNL PP RW BD QNCK 18GA 3.5IN LF (Needles) ×1
PACK SURGICAL BASIN SET FOAK (Other) ×1 IMPLANT
PACK SURGICAL BASIN SET FOAK MEDLINE INDUSTRIES, INC. (Other) ×1 IMPLANT
PADDING CAST L4 YD X W4 IN UNDERCAST (Cast) ×1 IMPLANT
PADDING CAST L4 YD X W4 IN UNDERCAST MILD STRETCH COHESIVE REGULAR (Cast) ×1 IMPLANT
PADDING CAST L4 YD X W6 IN UNDERCAST (Cast) ×1 IMPLANT
PADDING CAST L4 YD X W6 IN UNDERCAST MILD STRETCH COHESIVE WEBRIL (Cast) ×1 IMPLANT
PADDING CST CTTN WBRL 4YDX4IN LF STRL (Cast) ×1
PADDING CST CTTN WBRL 4YDX6IN LF STRL (Cast) ×1
PADNG CAST 4IN STRL (Cast) ×1
PADNG CAST 6IN STRL (Cast) ×1
PATELLA TRIATH ASYM 29X9MM (Patella) ×2 IMPLANT
PENCIL SMKEVC LF COAT PSHBTN (Cautery) ×1
PENCIL SMOKE EVAC 70MM PSH BTN (Cautery) ×1
PENCIL SMOKE EVACUATOR COATED PUSH (Cautery) ×1 IMPLANT
PENCIL SMOKE EVACUATOR COATED PUSH BUTTON NEPTUNE E-SEP (Cautery) ×1 IMPLANT
PIN FIXATION OD3.2 MM L140 MM (Guide Pin) IMPLANT
PIN FIXATION OD3.2 MM L140 MM NA (Guide Pin) IMPLANT
PIN FIXATION OD4 MM L110 MM BONE (Guide Pin) IMPLANT
PIN FIXATION OD4 MM L140 MM KNEE (Guide Pin) ×1 IMPLANT
PIN FIXATION OD4 MM L140 MM KNEE STRAIGHT (Guide Pin) ×1 IMPLANT
PIN FX 3.2MM 140MM STRL (Guide Pin)
PIN FX 4MM 110MM STRL (Guide Pin)
PIN FX STRG 4MM 140MM STRL KN (Guide Pin) ×1
PIN GUIDE 3.2X140MM (Guide Pin)
PIN MAKO BONE 4X110MM (Guide Pin)
PIN RESTORIS BONE 4X140MM (Guide Pin) ×1
RETRACTOR MAKO CORD SILC (Retractor) ×1
REVOLUTION CMS W/BREAKAWAY (Other) ×1
SET BASIN SINGLE DISP (Other) ×2
SET HANDPIECE SUCTION TUBE HIGH FLOW TIP (Cautery) ×2 IMPLANT
SET HANDPIECE SUCTION TUBE HIGH FLOW TIP INTERPULSE (Cautery) ×2 IMPLANT
SOL NACL .9% IRRIG 250ML NLTX (IV Solutions) ×1
SOLUTION IRR 0.9% NACL 250ML LF STRL PLS (IV Solutions) ×1
SOLUTION IRRIGATION 0.9% SODIUM CHLORIDE (IV Solutions) ×1 IMPLANT
SOLUTION IRRIGATION 0.9% SODIUM CHLORIDE 250 ML PLASTIC POUR BOTTLE (IV Solutions) ×1 IMPLANT
SPONGE GAUZE L2 IN X W2 IN 8 PLY PEEL (Dressing) ×1 IMPLANT
SPONGE GZE CTTN CRTY 2X2IN LF STRL 8P (Dressing) ×1
SPONGE GZE STR 2'S 8PLY 2X2 (Dressing) ×1
STNKNT IMPRV TAB 12IN X 48IN (Other) ×1
STOCKINETTE COTTON L48 IN X W12 IN (Other) ×1 IMPLANT
STOCKINETTE COTTON L48 IN X W12 IN HOLLOW IMPERVIOUS OUTER LAYER PULL (Other) ×1 IMPLANT
STOCKINETTE ORTH CTTN 48X12IN LF STRL (Other) ×1
SUTURE 3-0 FS-1 24MM (Suture) ×1
SUTURE ABS 2-0 CT1 VCL 36IN BRD COAT UD (Suture) ×2
SUTURE ABS 3-0 PS2 MNCRL MTPS 18IN MFL (Suture) ×1
SUTURE COATED VICRYL 2-0 CT-1 L36 IN (Suture) ×2 IMPLANT
SUTURE COATED VICRYL 2-0 CT-1 L36 IN BRAID COATED UNDYED ABSORBABLE (Suture) ×2 IMPLANT
SUTURE ETHIBOND 1 CT1 30IN (Suture) ×2
SUTURE ETHIBOND EXCEL 1 CT-1 L30 IN (Suture) ×2 IMPLANT
SUTURE ETHIBOND EXCEL 1 CT-1 L30 IN BRAID NONABSORBABLE (Suture) ×2 IMPLANT
SUTURE ETHILON BLACK 3-0 FS-1 L18 IN (Suture) ×1 IMPLANT
SUTURE MONOCRYL 3-0 PS-2 18IN (Suture) ×1
SUTURE MONOCRYL 3-0 PS-2 L18 IN (Suture) ×1 IMPLANT
SUTURE MONOCRYL 3-0 PS-2 L18 IN MONOFILAMENT UNDYED ABSORBABLE (Suture) ×1 IMPLANT
SUTURE NABSB 1 CT1 EBND EXC 30IN BRD (Suture) ×2
SUTURE NABSB 3-0 FS1 ETH MTPS 18IN MFL (Suture) ×1
SUTURE VICRYL 2-0 CT1 36IN (Suture) ×2
SYRINGE 50 ML GRADUATE NONPYROGENIC DEHP (Syringes, Needles) ×1 IMPLANT
SYRINGE 50 ML GRADUATE NONPYROGENIC DEHP FREE PVC FREE LOK MEDICAL (Syringes, Needles) ×1 IMPLANT
SYRINGE LUER LOK 50ML (Syringes, Needles) ×1
SYRINGE MED 50ML LL LF STRL GRAD N-PYRG (Syringes, Needles) ×1
SYSTEM BN CMNT REV LF MX BRKWY NOZ (Other) ×1
SYSTEM BONE CEMENT MIX BREAKAWAY NOZZLE (Other) ×1 IMPLANT
SYSTEM BONE CEMENT MIX BREAKAWAY NOZZLE REVOLUTION (Other) ×1 IMPLANT
SYSTEM NAVIGATION KNEE TRACK KIT (Kits) ×1 IMPLANT
SYSTEM NAVIGATION KNEE TRACK KIT VIZADISC (Kits) ×1 IMPLANT
SYSTEM NVG VIZADISC LF STRL KN TRK KT (Kits) ×1
TOURNIQUET REPRCSS 30X4 BLUE (Other) ×1
TOWEL L27 IN X W17 IN COTTON PREWASH (Other) ×1 IMPLANT
TOWEL L27 IN X W17 IN COTTON PREWASH DELINT HIGH ABSORBENT BLUE (Other) ×1 IMPLANT
TOWEL OR DISP 10PK (Other) ×1
TOWEL SRG CTTN 27X17IN LF STRL PREWASH (Other) ×1
TRAY SRG TOTAL KNEE ~~LOC~~ (Pack) ×1 IMPLANT
TRAY SURGICAL TOTAL KNEE ~~LOC~~ (Pack) ×1 IMPLANT
TRAY TOTAL KNEE ~~LOC~~ (Pack) ×1

## 2020-08-03 NOTE — Progress Notes (Signed)
Block procedure completed with sedation. Time out performed with anesthesiologist. Patient tolerated the procedure with no adverse side effects. Procedure discussed prior to start. Peripheral nerve block education initiated, including fall precautions/sensory deficits and pain management.  Patient and responsible adult verbalized understanding.

## 2020-08-03 NOTE — Progress Notes (Signed)
Admission Note-   Patient admitted to Surgical unit at 1715. Pt arrived per stretcher to room 532. Pt transferred to bed with use of sally tube.  Hand off report received from PACU RN. Pt is s/p Left TKA per Dr. Laveda Norman.     Patient oriented to room, bed in lowest position, bed alarm activated, call bell within reach.    Reviewed plan of care with pt including medications, diet, activity, physical therapy , use of IS, and safety measures in place.    Fall prevention reviewed with pt including use of bed alarm and to use call bell for needs/when getting up OOB.    Patient informed of visitor policy.   Belongings of clothing cell phone and eye glasses at the bedside.     Brief Clinical Picture:  Neuro:  Pt arrived awake, alert and oriented.  VSS afebrile. Warm blankets applied as pt states she is cold and axillary temp 96.7 Will monitor. Rates her pain as 1-2 on numeric scale.  (received percocet in PACU). Ace wrap C/D/I from above left knee to over left foot. Neurovascular checks WNL. Immobilizer undone but on leg with ice gel pack in place. SCDs applied (sleeve to right calf and foot pump to left foot) IVF infusing.  Bed is low/locked/alarm set.  Dinner tray ordered.      Skin Assessment  Two nurse skin assessment performed with: Alcario Drought RN. Skin intact.  No redness noted.  Ace wrap C/D/I to left leg (above knee to foot)     Braden score <15. No

## 2020-08-03 NOTE — Brief Op Note (Signed)
BRIEF OP NOTE    Date Time: 08/03/20 3:32 PM    Patient Name:   Wendy [name]    Date of Operation:   08/03/2020    Providers Performing:   Surgeon(s):  Abe People, MD    Assistant (s):   Circulator: Franco Collet, RN  Relief Circulator: Victoriano Lain, RN  Relief Scrub: Charlton Haws  Scrub Person: Mardene Celeste  First Assistant: Etta Quill  Preceptor: Raeanne Barry, RN; Tomi Bamberger, RN    Operative Procedure:   Procedure(s):  ARTHROPLASTY, TOTAL, KNEE MAKOPLASTY    Preoperative Diagnosis:   Pre-Op Diagnosis Codes:     * Primary osteoarthritis of one knee, left [M17.12]    Postoperative Diagnosis:   Post-Op Diagnosis Codes:     * Primary osteoarthritis of one knee, left [M17.12]    Anesthesia:   Spinal/ High adductor block    Estimated Blood Loss:    * No values recorded between 08/03/2020 12:47 PM and 08/03/2020  3:32 PM *    Implants:     Implant Name Type Inv. Item Serial No. Manufacturer Lot No. LRB No. Used Action   CEMENT SIMPLEX P TOBRA 1 PAK - [id number] Cement CEMENT SIMPLEX P TOBRA 1 PAK  [employer] [id number] Left 2 Implanted   INSERT TRIATH X3 CS SZ4 - [id number] Insert INSERT TRIATH X3 CS SZ4  [employer] [id number] Left 1 Implanted   COMP TRIATH FMRL SZ3 CR L - [id number] Component COMP TRIATH FMRL SZ3 CR L  [employer] [id number] Left 1 Implanted   BASEPLT TRIATH PRIM TIB SZ4 - [id number] Component BASEPLT TRIATH PRIM TIB SZ4  [employer] [id number] Left 1 Implanted   PATELLA TRIATH ASYM 29X9MM - [id number] Patella PATELLA TRIATH ASYM 29X9MM  [employer] [id number] Left 1 Implanted       Drains:   Drains: no    Specimens:   * No specimens in log *      Findings:   As post op    Complications:   no      Signed by: Abe People, MD                                                                           [facility]

## 2020-08-03 NOTE — Interval H&P Note (Signed)
There is no change in exam today comparing to the most recent exam in my office.  Plan to do TOTAL knee replacement , MAKOPLASTY, LEFT knee.  Dr Terriah Reggio

## 2020-08-03 NOTE — Anesthesia Procedure Notes (Signed)
Peripheral Nerve Block    Patient location during procedure: Pre-Op  Reason for block: Post-op pain management      Injection technique: Single Shot  Block Region: Adductor Canal/Mid-Thigh Femoral  Laterality: Left      Block at surgeon's request Yes    Start time: 08/03/2020 12:12 PM  End time: 08/03/2020 12:20 PM      Staffing  Anesthesiologist: Burman Riis, MD  Performed: Anesthesiologist       Pre-procedure Checklist   Completed: patient identified, surgical consent, pre-op evaluation, timeout performed, risks and benefits discussed, anesthesia consent given and correct site  Timeout Completed:  08/03/2020 12:12 PM      Peripheral Block  Patient monitoring: Pulse oximetry, EKG, NIBP and Nasal cannula O2  Patient position: Supine  Premedication: Yes and Meaningful contact maintained  Local infiltration: Lidocaine 2%      Needle  Needle type: Stim needle   Needle gauge: 21 G  Needle length: 4 in      Guidance: ultrasound guided  Ultrasound Guided: LA spread visualized, Needle visualized, Relevant anatomy identified (nerve, vessels, muscle) and Image stored or printed          Assessment   Incremental injection: yes  Injection made incrementally with aspirations every 5 mL.  Injection Resistance: no  Paresthesia Pain: No  Blood Aspirated: Yes (just prior to last 5 mL given.  Needle withdrawn and flushed.  Last 5 mL placed easily.)  no suspected intravascular injection  Patient tolerated procedure well: Yes  Block Outcome: No complications and Successful block

## 2020-08-03 NOTE — Discharge Instr - AVS First Page (Addendum)
Reason for your Hospital Admission:    Surgery Right total knee replacement    Instructions for after your discharge:  Keep wound dry and clean all the time.  Cover the wound with a plastic bag when taking shower.   Full WBAT with walker or crutches.  Use knee immobilizer when ambulating for 3-4 days.    Take off knee immobilizer when taking shower or going to sleep.   Start ROM exercise with PT.  Remove the knee brace when sitting or sleeping.  Ice/ Elevation the knee for improving pain and swelling.  Take ASA EC 325 mg twice a day for 4 weeks.  Call Dr. Laveda Norman if having any problem/ concern.        Home Health Discharge Information     Your doctor has ordered Physical Therapy in-home service(s) for you while you recuperate at home, to assist you in the transition from hospital to home.     The agency that you or your representative chose to provide the service:  Name of Home Health Agency Placement:  ([facility] [phone])]        [employer]:  Name of DME Agency: [facility] [place] [phone])]  Equipment Ordered:Rolling walker will be delivered to the room              IF YOU HAVE NOT HEARD FROM YOUR HOME YOUR HOME HEALTH AGENCY WITHIN 24-48 HOURS AFTER DISCHARGE PLEASE CALL YOUR AGENCY TO ARRANGE A TIME FOR YOUR FIRST VISIT. FOR ANY SCHEDULING CONCERNS OR QUESTIONS RELATED TO HOME HEALTH, SUCH AS TIME OR DATE PLEASE CONTACT YOUR HOME HEALTH AGENCY AT THE NUMBER LISTED ABOVE.

## 2020-08-03 NOTE — Transfer of Care (Signed)
Anesthesia Transfer of Care Note    Patient: Wendy [name]    Procedures performed: Procedure(s):  ARTHROPLASTY, TOTAL, KNEE MAKOPLASTY    Anesthesia type: General TIVA, Regional Anesthesia and Spinal    Patient location:Phase II PACU    Last vitals:   Vitals:    08/03/20 1547   BP: 114/73   Pulse: 81   Resp: 15   Temp:    SpO2: 100%       Post pain: Patient not complaining of pain, continue current therapy      Mental Status:awake and alert     Respiratory Function: tolerating room air    Cardiovascular: stable    Nausea/Vomiting: patient not complaining of nausea or vomiting    Hydration Status: adequate    Post assessment: no apparent anesthetic complications and no reportable events    Signed by: Janan Ridge, CRNA  08/03/20 3:48 PM

## 2020-08-03 NOTE — Anesthesia Procedure Notes (Signed)
Spinal      Patient location during procedure: OR  Reason for block: Primary Anesthesia In the OR    Block at Surgeon's request: Yes      Start time: 08/03/2020 12:48 PM    End time: 08/03/2020 12:53 PM    Staffing  Performed: anesthesiologist   Anesthesiologist: Burman Riis, MD      Pre-procedure Checklist   Completed: patient identified, surgical consent, pre-op evaluation, timeout performed, risks and benefits discussed, monitors and equipment checked, anesthesia consent given and correct site  Timeout Completed:  08/03/2020 12:48 PM    Spinal  Patient monitoring: pulse oximetry and NIBP    Premedication: Yes and Meaningful Contact Maintained    Patient position: sitting    Sterile Technique: chlorhexidine gluconate and isopropyl alcohol          Approach: midline  Number of attempts: 1      Needle Placement    Needle gauge: 25      Intrathecal space entered at L3-4        Paresthesia Pain: no    Catheter Placement   CSF Return: Yes  Blood Return: No              Assessment   Sensory level: Bilateral  Block Outcome: patient tolerated procedure well and no complications

## 2020-08-03 NOTE — Anesthesia Procedure Notes (Signed)
Peripheral Nerve Block    Patient location during procedure: Pre-Op  Reason for block: Post-op pain management      Injection technique: Single Shot  Block Region: IPACK  Laterality: Left      Block at surgeon's request Yes    Start time: 08/03/2020 12:05 PM  End time: 08/03/2020 12:10 PM      Staffing  Anesthesiologist: Burman Riis, MD  Performed: Anesthesiologist       Pre-procedure Checklist   Completed: patient identified, surgical consent, pre-op evaluation, timeout performed, risks and benefits discussed, anesthesia consent given and correct site  Timeout Completed:  08/03/2020 12:03 PM      Peripheral Block  Patient monitoring: Pulse oximetry, EKG, NIBP and Nasal cannula O2  Patient position: Supine  Premedication: Yes and Meaningful contact maintained  Local infiltration: Lidocaine 2%      Needle  Needle type: Stim needle   Needle gauge: 21 G  Needle length: 4 in      Guidance: ultrasound guided  Ultrasound Guided: LA spread visualized, Needle visualized, Relevant anatomy identified (nerve, vessels, muscle) and Image stored or printed          Assessment   Incremental injection: yes  Injection made incrementally with aspirations every 5 mL.  Injection Resistance: no  Paresthesia Pain: No    Blood Aspirated: No  no suspected intravascular injection  Patient tolerated procedure well: Yes  Block Outcome: No complications and Successful block

## 2020-08-03 NOTE — Consults (Signed)
[facility]      Patient: Wendy [name]  Date: 08/03/2020   DOB: 18-Oct-1955  Admission Date: 08/03/2020   MRN: [id number]  Attending:Ryelan Kazee R Yuritzy Zehring MD   Severe pain in the left knee joint not better with medications  History Gathered From: Self and epic    HISTORY AND PHYSICAL     Wendy [name] is a 65 y.o. 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 Norlene Campbell today.  No nausea vomiting fever chills chest pain shortness of breath she had a bowel movement    Past Medical History:   Diagnosis Date    Brain tumor (benign)     Hypercholesteremia     Hypertensive disorder        Past Surgical History:   Procedure Laterality Date    BRAIN SURGERY  2009    BREAST BIOPSY Right 2008    right breast biopsy- benign    BREAST BIOPSY Right 1970s    right breast tumor removed 40+ years ago - benign    BREAST CYST EXCISION  1970s    right breast tumor removed 40+ years ago - benign    CESAREAN SECTION      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, CORNEAL IMPLANT Left     EYE SURGERY      TUBAL LIGATION         Prior to Admission medications    Medication Sig Start Date End Date Taking? Authorizing Provider   co-enzyme Q-10 50 MG capsule Take 50 mg by mouth daily.   Yes [provider]   ergocalciferol (Drisdol) 1.25 MG (50000 UT) capsule Take 1 capsule (50,000 Units total) by mouth once a week 07/20/20  Yes Venkatachallam, Harlow Ohms, MD   fluticasone (FLONASE) 50 MCG/ACT nasal spray 1 spray by Nasal route as needed for Rhinitis 06/30/20  Yes Venkatachallam, Harlow Ohms, MD   hydroCHLOROthiazide (HYDRODIURIL) 12.5 MG tablet Take 1 tablet (12.5 mg total) by mouth daily 06/30/20 12/27/20 Yes Venkatachallam, Harlow Ohms, MD   ibuprofen (ADVIL) 600 MG tablet Take 1 tablet (600 mg total) by mouth every 6 (six) hours as needed for Pain 01/16/20  Yes Clydell Hakim, MD   lisinopril (ZESTRIL) 20 MG tablet Take 1 tablet (20 mg total) by mouth daily 06/30/20 12/27/20 Yes Venkatachallam, Harlow Ohms, MD   Multiple Vitamin (MULTIVITAMIN) tablet Take 1 tablet by mouth daily.   Yes [provider]   prednisoLONE acetate (PRED FORTE) 1 % ophthalmic suspension Place 1 drop into the left eye 4 (four) times daily 06/30/20  Yes Venkatachallam, Harlow Ohms, MD   rosuvastatin (CRESTOR) 40 MG tablet 1 tab qod 06/30/20  Yes Venkatachallam, Sunitha, MD   triamcinolone (KENALOG) 0.025 % ointment Apply topically 2 (two) times daily  Patient taking differently: Apply topically as needed 08/23/17  Yes Bouhouch, Jacqualyn Posey, MD   acetaZOLAMIDE (DIAMOX) 125 MG tablet Take 125 mg by mouth 3 (three) times daily    [provider]   nortriptyline (PAMELOR) 10 MG capsule Take 1 capsule (10 mg total) by mouth as needed (sleep) 01/16/20   Clydell Hakim, MD   oxyCODONE-acetaminophen (PERCOCET) 5-325 MG per tablet  08/03/20   [provider]   traMADol Janean Sark) 50 MG tablet  08/03/20   [provider]  Allergies   Allergen Reactions    Iodine Shortness Of Breath    Penicillins Shortness Of Breath     Has patient had a PCN reaction causing immediate rash, facial/tongue/throat swelling, SOB or lightheadedness with hypotension: Yes  Has patient had a PCN reaction causing severe rash involving mucus membranes or skin necrosis: No  Has patient had a PCN reaction that required hospitalization: No  Has patient had a PCN reaction occurring within the last 10 years: Yes  If all of the above answers are "NO", then may proceed with Cephalosporin use.    Shellfish-Derived Products Anaphylaxis    Atorvastatin      Joint pain     Morphine      Itchy     Tetanus Immune Globulin      Arm swelling     Zocor [Simvastatin]      No response        CODE STATUS: full code    PRIMARY CARE MD: Windell Moment, MD    Family History   Problem Relation Age of Onset    Diabetes Mother      Hypertension Mother     Diabetes Father     Hypertension Father     Breast cancer Neg Hx        Social History     Tobacco Use    Smoking status: Never Smoker    Smokeless tobacco: Never Used   Vaping Use    Vaping Use: Never used   Substance Use Topics    Alcohol use: Yes     Alcohol/week: 1.0 - 2.0 standard drink     Types: 1 - 2 Shots of liquor per week    Drug use: No       REVIEW OF SYSTEMS     As above.    PHYSICAL EXAM     Vital Signs (most recent): BP (!) 155/91    Pulse 64    Temp 97.1 F (36.2 C) (Temporal)    Resp 17    Ht 1.613 m (5' 3.5")    Wt 77.5 kg (170 lb 14.4 oz)    SpO2 100%    BMI 29.80 kg/m   Constitutional:  Patient speaks freely in full sentences.   HEENT: NC/AT, PERRL, no scleral icterus or conjunctival pallor, no nasal discharge, MMM, oropharynx without erythema or exudate  Neck: trachea midline, supple, no cervical or supraclavicular lymphadenopathy or masses  Cardiovascular: RRR, normal S1 S2, no murmurs, gallops, palpable thrills, no JVD, Non-displaced PMI.  Respiratory: Normal rate. No retractions or increased work of breathing. Clear to auscultation and percussion bilaterally.  Gastrointestinal: +BS, non-distended, soft, non-tender, no rebound or guarding, no hepatosplenomegaly  Genitourinary: no suprapubic or costovertebral angle tenderness  Musculoskeletal: ROM and motor strength grossly normal. No clubbing, edema, or cyanosis. DP and radial pulses 2+ and symmetric.  Left knee wrapped up Ace bandage ice pack distal neurovascular intact  Skin: no rashes, jaundice or other lesions  Neurologic: EOMI, CN 2-12 grossly intact. no gross motor or sensory deficits, patellar and bicep DTR 2+ and symmetric, downward plantar reflexes  Psychiatric: AAOx3, affect and mood appropriate. The patient is alert, interactive, appropriate.    LABS & IMAGING     No results found for this or any previous visit (from the past 24 hour(s)).    MICROBIOLOGY:  Blood Culture: not done  Urine Culture:  not done  Antibiotics Started: Ancef    IMAGING:  US GUIDED NERVE BLOCK FOR ANESTHESIA  Final Result          CARDIAC:  EKG Interpretation:  sinus rhythm        EMERGENCY DEPARTMENT COURSE:  Orders Placed This Encounter   Procedures    US GUIDED NERVE BLOCK FOR ANESTHESIA    Place sequential compression device    Maintain sequential compression device    EKG SCAN    Insert peripheral IV if patient doesn't already have IV Access       ASSESSMENT & PLAN     JANESSA MICKLE is a 65 y.o. female   Severe osteoarthritis of the left knee  Left total knee arthroplasty Dr. Norlene Campbell 08/03/2020  Hypertension  Hyperlipidemia  Fuchs dystrophy of the left cornea  Pseudophakia left eye  Sciatica lumbar spine  Insomnia  Osteoporosis    Thanks for the consult  Incentive spirometry  Pain control  PT OT  I shall follow the lady along with you                This note was generated by the Epic EMR system/ Dragon speech recognition and may contain inherent errors or omissions not intended by the user. Grammatical errors, random word insertions, deletions, pronoun errors and incomplete sentences are occasional consequences of this technology due to software limitations. Not all errors are caught or corrected. If there are questions or concerns about the content of this note or information contained within the body of this dictation they should be addressed directly with the author for clarification                                                      Signed,  Lenox Ponds MD  08/03/2020 12:35 PM

## 2020-08-04 ENCOUNTER — Encounter: Payer: Self-pay | Admitting: Acute Care

## 2020-08-04 ENCOUNTER — Encounter: Payer: Self-pay | Admitting: Orthopaedic Surgery

## 2020-08-04 DIAGNOSIS — G8918 Other acute postprocedural pain: Secondary | ICD-10-CM | POA: Diagnosis present

## 2020-08-04 LAB — CBC
Absolute NRBC: 0 10*3/uL (ref 0.00–0.00)
Hematocrit: 36 % (ref 34.7–43.7)
Hgb: 11.5 g/dL (ref 11.4–14.8)
MCH: 28.3 pg (ref 25.1–33.5)
MCHC: 31.9 g/dL (ref 31.5–35.8)
MCV: 88.7 fL (ref 78.0–96.0)
MPV: 9.6 fL (ref 8.9–12.5)
Nucleated RBC: 0 /100 WBC (ref 0.0–0.0)
Platelets: 322 10*3/uL (ref 142–346)
RBC: 4.06 10*6/uL (ref 3.90–5.10)
RDW: 13 % (ref 11–15)
WBC: 8.12 10*3/uL (ref 3.10–9.50)

## 2020-08-04 LAB — BASIC METABOLIC PANEL
Anion Gap: 9 (ref 5.0–15.0)
BUN: 9 mg/dL (ref 7–19)
CO2: 28 mEq/L (ref 22–29)
Calcium: 8.5 mg/dL (ref 8.5–10.5)
Chloride: 101 mEq/L (ref 100–111)
Creatinine: 0.7 mg/dL (ref 0.6–1.0)
Glucose: 148 mg/dL — ABNORMAL HIGH (ref 70–100)
Potassium: 3.4 mEq/L — ABNORMAL LOW (ref 3.5–5.1)
Sodium: 138 mEq/L (ref 136–145)

## 2020-08-04 LAB — GFR: EGFR: >60

## 2020-08-04 MED ORDER — HYDROCODONE-ACETAMINOPHEN 5-325 MG PO TABS
2.0000 | ORAL_TABLET | ORAL | Status: DC | PRN
Start: 2020-08-04 — End: 2020-08-05

## 2020-08-04 MED ORDER — OXYCODONE-ACETAMINOPHEN 5-325 MG PO TABS
2.0000 | ORAL_TABLET | ORAL | Status: DC | PRN
Start: 2020-08-04 — End: 2020-08-05
  Administered 2020-08-04: 2 via ORAL
  Filled 2020-08-04 (×2): qty 2

## 2020-08-04 MED ORDER — ASPIRIN 325 MG PO TBEC
325.0000 mg | DELAYED_RELEASE_TABLET | Freq: Two times a day (BID) | ORAL | 0 refills | Status: DC
Start: 2020-08-04 — End: 2023-07-03

## 2020-08-04 MED ORDER — DSS 100 MG PO CAPS
100.0000 mg | ORAL_CAPSULE | Freq: Two times a day (BID) | ORAL | 2 refills | Status: DC
Start: 2020-08-04 — End: 2021-02-02

## 2020-08-04 MED ORDER — OXYCODONE-ACETAMINOPHEN 5-325 MG PO TABS
2.0000 | ORAL_TABLET | ORAL | 0 refills | Status: DC | PRN
Start: 2020-08-04 — End: 2020-08-18

## 2020-08-04 MED ORDER — ROSUVASTATIN CALCIUM 20 MG PO TABS
40.0000 mg | ORAL_TABLET | ORAL | Status: DC
Start: 2020-08-04 — End: 2020-08-05

## 2020-08-04 NOTE — Progress Notes (Signed)
PROGRESS NOTE    Date Time: 08/04/20 6:28 PM  Patient Name: Wendy [name]      Assessment:   S/p Left total knee replacement pod#1.  Severe pain post op     Plan:   Keep patient in the hospital for 1 more day due to severe pain post op.  Increase dosage of Percocet. Resume PT after pain is under controlled and discharge home tomorrow after PT. Discussed the plan with the patient and Dr. Allena [name]. Patient needs home PT. Start ASA / TEDS, SCD bilateral legs for DVT prophylaxis.    Subjective:   Patient complains of severe pain 10/10 despite of taking multiple pain meds.    Medications:     Current Facility-Administered Medications   Medication Dose Route Frequency    aspirin EC  325 mg Oral BID    docusate sodium  100 mg Oral BID    ferrous sulfate 324 mg-ascorbic acid 500 mg combo dose   Oral Daily    hydroCHLOROthiazide  12.5 mg Oral Daily    lisinopril  20 mg Oral Daily    prednisoLONE acetate  1 drop Left Eye QID    rosuvastatin  40 mg Oral Every Other Day    sodium chloride (PF)  3 mL Intravenous Q8H       Review of Systems:   A comprehensive review of systems was: Negative except severe pain Left knee    Physical Exam:     Vitals:    08/04/20 1556   BP: 120/76   Pulse: 82   Resp: 16   Temp: 98.1 F (36.7 C)   SpO2: 97%       Intake and Output Summary (Last 24 hours) at Date Time    Intake/Output Summary (Last 24 hours) at 08/04/2020 1828  Last data filed at 08/04/2020 1800  Gross per 24 hour   Intake 1962.5 ml   Output 3800 ml   Net -1837.5 ml       General appearance - oriented to person, place, and time  Extremities - Left knee with dressing dry and clean, NVI    Labs:     Results     Procedure Component Value Units Date/Time    Basic Metabolic Panel [[id number]]  (Abnormal) Collected: 08/04/20 0512    Specimen: Blood Updated: 08/04/20 0628     Glucose 148 mg/dL      BUN 9 mg/dL      Creatinine 0.7 mg/dL      Calcium 8.5 mg/dL      Sodium 086 mEq/L      Potassium 3.4 mEq/L      Chloride 101 mEq/L      CO2 28  mEq/L      Anion Gap 9.0    GFR [[id number]] Collected: 08/04/20 0512     Updated: 08/04/20 0628     EGFR >60.0    CBC without differential [[id number]] Collected: 08/04/20 0512    Specimen: Blood Updated: 08/04/20 0522     WBC 8.12 x10 3/uL      Hgb 11.5 g/dL      Hematocrit 01.0 %      Platelets 322 x10 3/uL      RBC 4.06 x10 6/uL      MCV 88.7 fL      MCH 28.3 pg      MCHC 31.9 g/dL      RDW 13 %      MPV 9.6 fL      Nucleated RBC 0.0 /100  WBC      Absolute NRBC 0.00 x10 3/uL               Rads:   Radiological Procedure reviewed.    Signed by: Wendy People, MD

## 2020-08-04 NOTE — Plan of Care (Signed)
NURSE NOTE SUMMARY  [facility] - [facility]   Patient Name: Wendy [name]   Attending Physician: Abe People, MD   Today's date:   08/04/2020 LOS: 0 days   Shift Summary:                                                              Pt A&Ox4, denies numbness/tingling, vss, pain regimen adjusted to meet patient's needs. Pt tolerating regular diet, denies n/v. -flatus/-bm. Pt ambulated well with PT/OT. Ace bandage removed per MD. Dressing on left knee CDI. Pt is in bed resting with bed alarm on. Pt understands to use call button for assistance.    Provider Notifications:   N/A         N/A PMP Activity: Step 7 - Walks out of Room (08/04/2020 11:00 AM)     Weight tracking:  Family Dynamic:   Last 3 Weights for the past 72 hrs (Last 3 readings):   Weight   08/03/20 1112 77.5 kg (170 lb 14.4 oz)    Visiting         Recent Vitals Last Bowel Movement   BP: 120/76 (08/04/2020  3:56 PM)  Heart Rate: 82 (08/04/2020  3:56 PM)  Temp: 98.1 F (36.7 C) (08/04/2020  3:56 PM)  Resp Rate: 16 (08/04/2020  3:56 PM)  Height: 1.613 m (5' 3.5") (08/03/2020 11:12 AM)  Weight: 77.5 kg (170 lb 14.4 oz) (08/03/2020 11:12 AM)  SpO2: 97 % (08/04/2020  3:56 PM)   No data recorded         Problem: Safety  Goal: Patient will be free from injury during hospitalization  Outcome: Progressing  Flowsheets (Taken 08/04/2020 0659 by Christene Slates, RN)  Patient will be free from injury during hospitalization:   Assess patient's risk for falls and implement fall prevention plan of care per policy   Provide and maintain safe environment   Use appropriate transfer methods   Hourly rounding   Ensure appropriate safety devices are available at the bedside     Problem: Pain  Goal: Pain at adequate level as identified by patient  Outcome: Progressing  Flowsheets (Taken 08/04/2020 0659 by Christene Slates, RN)  Pain at adequate level as identified by patient:   Identify patient comfort function goal   Assess for risk of opioid induced respiratory  depression, including snoring/sleep apnea. Alert healthcare team of risk factors identified.   Assess pain on admission, during daily assessment and/or before any "as needed" intervention(s)   Reassess pain within 30-60 minutes of any procedure/intervention, per Pain Assessment, Intervention, Reassessment (AIR) Cycle   Evaluate patient's satisfaction with pain management progress   Evaluate if patient comfort function goal is met   Offer non-pharmacological pain management interventions     Problem: Side Effects from Pain Analgesia  Goal: Patient will experience minimal side effects of analgesic therapy  Outcome: Progressing  Flowsheets (Taken 08/04/2020 1822)  Patient will experience minimal side effects of analgesic therapy:   Monitor/assess patient's respiratory status (RR depth, effort, breath sounds)   Prevent/manage side effects per LIP orders (i.e. nausea, vomiting, pruritus, constipation, urinary retention, etc.)   Evaluate for opioid-induced sedation with appropriate assessment tool (i.e. POSS)   Assess for changes in cognitive function     Problem: Moderate/High  Fall Risk Score >5  Goal: Patient will remain free of falls  Outcome: Progressing  Flowsheets (Taken 08/04/2020 1000)  High (Greater than 13):   HIGH-Bed alarm on at all times while patient in bed   HIGH-Consider use of low bed     Problem: Knee Surgery  Goal: Hemodynamic Stability  Outcome: Progressing  Flowsheets (Taken 08/04/2020 1822)  Hemodynamic stability:   Monitor/assess vital signs   Monitor SpO2 and treat as needed   Monitor intake and output.  Notify LIP if urine output is less than 30 ml/hr.   Monitor/assess lab values and report abnormal values  Goal: Pain at adequate level as identified by patient  Outcome: Progressing  Flowsheets (Taken 08/04/2020 1822)  Pain at adequate level as identified by patient: Identify patient comfort function goal  Goal: Stable Neurovascular Status  Outcome: Progressing  Flowsheets (Taken  08/04/2020 0659 by Christene Slates, RN)  Stable neurovascular status:   Assess and document plantar/dorsiflexion every 4 hours   Monitor/assess for return of sensation after nerve block therapy if indicated   Monitor/assess neurovascular status (pulses, capillary refill, pain, paresthesia, presence of edema)   VTE prevention: administer anticoagulant(s) and/or apply anti-embolism stockings/devices as ordered  Goal: Mobility/activity is maintained at optimum level for patient  Outcome: Progressing  Flowsheets (Taken 08/04/2020 0659 by Christene Slates, RN)  Mobility/activity is maintained at optimum level for patient:   Administer analgesics as prescribed to achieve pain goal   Dangle/stand at bedside if indicated with assistance as needed   Out of bed to chair if indicated with assistance as needed   Ambulate equal to or greater than 50 feet   Teach/review/reinforce knee precautions with patient/patient care companion (no pillow under knee, lock out knee flexion feature on bed)   Teach/review/reinforce exercises (ankle pumps, quad sets, gluteal sets)   Review weight bearing status with patient/patient care companion

## 2020-08-04 NOTE — Progress Notes (Signed)
Home Health Referral      Referral from Debra (Case Manager) for home health care upon discharge.    By [employer], the patient has the right to freely choose a home care provider.    Arrangements have been made with:     A company of the patients choosing. We have supplied the patient with a listing of providers in your area who asked to be included and participate in Medicare.    [facility], formerly [facility], a home care agency that provides adult home care services and participates in Medicare    The preferred provider of your insurance company. Choosing a home care provider other than your insurance company's preferred provider may affect your insurance coverage.      Home Health Discharge Information    Your doctor has ordered Physical Therapy in-home service(s) for you while you recuperate at home, to assist you in the transition from hospital to home.    The agency that you or your representative chose to provide the service:  Name of Home Health Agency Placement:  ([facility] [phone])]      [employer]:  Name of DME Agency: [facility] [place] [phone])]  Equipment Ordered:Rolling walker will be delivered to the room          IF YOU HAVE NOT HEARD FROM YOUR HOME YOUR HOME HEALTH AGENCY WITHIN 24-48 HOURS AFTER DISCHARGE PLEASE CALL YOUR AGENCY TO ARRANGE A TIME FOR YOUR FIRST VISIT. FOR ANY SCHEDULING CONCERNS OR QUESTIONS RELATED TO HOME HEALTH, SUCH AS TIME OR DATE PLEASE CONTACT YOUR HOME HEALTH AGENCY AT THE NUMBER LISTED ABOVE.            Signed by: Rollen Sox, RN  Date Time: 08/04/20 9:21 AM    HOME HEALTH REFERRAL    PATIENT DEMOGRAPHICS:        Name: Wendy [name]    Discharge Address: [address]  [place] [address]      Primary Telephone Number:  [phone]  Secondary Telephone Number:   Emergency Contact and Number: Extended Emergency Contact Information  Primary Emergency Contact: Henderson,Denzell  Work  Phone: [phone]  Mobile Phone: [phone]  Relation: Son  Secondary Emergency Contact: Daphine Deut[place]  Home Phone: [phone]  Mobile Phone: [phone]  Relation: Daughter        Ordering Physician: Dr Norlene Campbell    PCP: Windell Moment, MD, [phone]    Following Physician: Dr Charisse March to Follow: Yes  Date/Time of Call:     Language/Communication Barrier:        No    Primary Diagnosis and Reason for Services:       Primary osteoarthritis of left knee  ARTHROPLASTY, TOTAL, KNEE MAKOPLASTY     [employer] (Labs, Wounds, Infusions, etc.):      Additional Comments:      COVID-19 Screening:    COVID-19      not tested        Discharge Date: 08/04/20  Referral Source (PACC/Hospital/Unit): Rollen Sox, RN  Referral Date: 08/04/20          Home Health face-to-face (FTF) Encounter (Order [id number])  Consult  Date: 08/03/2020 Department: [facility] Ordering/Authorizing: Abe People, MD           Order Information    Order Date/Time Release Date/Time Start Date/Time End Date/Time   08/03/20 04:07  PM None 08/03/20 04:07 PM 08/03/20 04:07 PM       Order Details    Frequency Duration Priority Order Class   Once 1 occurrence Routine Hospital Performed               Standing Order Information    Remaining Occurrences Interval Last Released     0/1 Once 08/03/2020                 Provider Information    Ordering User Ordering Provider Authorizing Provider   Abe People, MD Abe People, MD Abe People, MD   Attending Provider(s) Admitting Provider PCP   Abe People, MD Abe People, MD Windell Moment, MD             Home Health face-to-face (FTF) Encounter: Patient Communication    Not Released Not seen           Order Questions    Question Answer   Date I saw the patient face-to-face: 08/03/2020   Evidence this patient is homebound because: A. Post operative restrictions, weight bearing status impedes mobility > 5 feet    C. Decreased endurance, strength, ROM,  cadence, safety/judgment during mobility   Medical conditions that necessitate Home Health care: A. Post operative procedure requiring follow up care & monitoring for complication   Per clinical findings, following services are medically necessary: PT   Clinical findings that support the need for Physical Therapy. PT will A. Evaluate and treat functional impairment and improve mobility                   Process Instructions    Please select Home Care Services medically necessary.     Based on the above findings, I certify that this patient is confined to the home and needs intermittent skilled nursing care, physical therapry and / or speech therapy or continues to need occupational therapy. The patient is under my care, and I have initiated the establishment of the plan of care. This patient will be followed by a physician who will periodically review the plan of care.      Collection Information            Consult Order Info    ID Description Priority Start Date Start Time   [id number] Home Health face-to-face (FTF) Encounter Routine 08/03/2020 4:07 PM   Provider Specialty Referred to   ______________________________________ _____________________________________         Acknowledgement Info    For At Acknowledged By Acknowledged On   Placing Order 08/03/20 1607 Carma Lair, RN 08/03/20 1628                           Patient Information    Patient Name   Wendy [name] Legal Sex   Female DOB   08-04-1955       Additional Information    Associated Reports External References   Priority and Order Details [employer]                  [facility]  [facility]          Patient Name: Wendy [name], Wendy [name]     MRN: [id number]     CSN: [id number]       Account Information    Hosp Acct #   [id number] Patient Class   Observation Service  Orthopedics Accommodation Code  Semi-Private  Admission Information    Admitting Physician:  Attending Physician: Abe People, MD  Abe People, MD Unit  [facility]* L&D Status     Admitting Diagnosis: Primary osteoarthritis of one knee, left; Primary osteoarthritis of left k* Room / Bed  Z610/R604-54 L&D - Last Menstrual Cycle     Chief Complaint:      Admit Type:  Admit Date/Time:  Discharge Date/Time: Elective  08/03/2020 / 1104   /  Length of Stay: 0 Days   L&D EDD   Estimated Date of Delivery: None noted.     Patient Information            Home Address: [address]  [place] [address] Employer:  Employer Address:     ,     Main Phone: [phone] Employer Phone:    SSN: [id number]     DOB: [birth year] (64 yrs)     Sex: Female Primary Care Physician: Windell Moment*   Marital Status: Married Referring Physician:       No ref. provider found   Race: Black or African American     Ethnicity: Non Hispanic/Latino     Emergency Contacts  Name Home Phone Work Phone Mobile Phone Relationship Barton Fanny  [phone] Eartha Inch [phone]  [phone] Daughter         Guarantor Information    Guarantor Name: AMERIKA, NOURSE Guarantor ID: [id number]   Guarantor Relationship to Pt: Self Guarantor Type: Personal/Family   Guarantor DOB:   1955-10-21     Guarantor Address: [address]   [place], [place] [address]       Guarantor Home Phone: [phone] Guarantor Employer:        Guarantor Work Phone:  Pensions consultant Emp Phone:               [employer]    Insurance Name: MEDICARE [employer] AARP MED [id number] [id number] Subscriber Name: [facility]   Insurance Address:   [address] [address]  [place], [place] [address] Subscriber DOB: 1955-05-14     Subscriber ID: [id number]   Insurance Phone: [phone] Pt Relationship to Sub:   Self   Insurance ID:      Group Name:  Preauthorization #: [id number]   Group #: [id number] Preauthorization Days:      Secondary Insurance    Insurance Name: - Subscriber Name:    Insurance Address:     ,   Statistician DOB:      Subscriber ID:    Youth worker:  Pt Relationship to Sub:      Insurance ID:      Group Name:  Preauthorization #:    Group #:  Preauthorization Days:      [employer] Name: Scientist, clinical (histocompatibility and immunogenetics) Name:    Community education officer Address:     ,   Statistician DOB:      Subscriber ID:    Press photographer:  Pt Relationship to Sub:      Insurance ID:      Group Name:  Preauthorization #:    Group #:  Preauthorization Days:        08/04/2020 9:23 AM

## 2020-08-04 NOTE — Progress Notes (Signed)
Patient interviewed and denies any anesthesia complications such as nausea, vomiting or sore throat. Voiding without difficulty.    Post-op Day 1    Date:  08/04/2020 Time:  9:37 AM      Visit Type:  Inpatient Room #   [phone]    Subjective:  Symptoms Include: Severe pain this morning    Type of block: Adductor canal single shot plus iPACK single shot  Surgery: ARTHROPLASTY, TOTAL, KNEE MAKOPLASTY    Objective:  Pain Score  7- pain in upper thigh and back of knee                    Motor Block  No                    Sensory Block  Yes-patient has numbness medial left knee.                    Catheter site  N/A    Assessment:  Other: Patient reporting pain in upper left thigh down to lower leg.     Plan: Called Dr. Carmela Rima office regarding pain management.  Percocet 1 tablet increased to 2 tablets prn every 4 hours prn    Start or continue oral pain medications   Yes    Would pt receive block again?  Yes

## 2020-08-04 NOTE — OT Eval Note (Addendum)
[facility]   Occupational Therapy Evaluation     Patient: Wendy [name]      MRN#: [id number]   Unit: 5NEW ORTHOPEDICS  Bed: V409/W119-14      Attention Physicians:  For patients in an observation or outpatient status, Medicare requires the ordering provider to certify that this service is medically necessary.  Please co-sign this evaluation to indicate your agreement.  Thank you.    Time Calculation  OT Received On: 08/04/20  Start Time: 7829  Stop Time: 1040  Time Calculation (min): 45 min    Consult received for Wendy [name] for OT Evaluation and Treatment.  Patients medical condition is appropriate for Occupational therapy intervention at this time.      OT Recommendations  Discharge Recommendation: Home with supervision  DME Recommended for Discharge: Front wheel walker;Reacher  Comments:  If patient goes home, the following equipment and services are recommended: SPV      Evaluation:   Precautions and Contraindications:   Precautions  Weight Bearing Status: LLE WBAT  Total Knee Replacement: knee immobilizer;OOB  Precaution Instructions Given to Patient: Yes  Other Precautions: falls  PPE worn: procedural mask and goggles     Medical Diagnosis: Primary osteoarthritis of one knee, left [M17.12]  Primary osteoarthritis of left knee [M17.12]    Rehab Diagnosis: pain in L knee, generalized muscle weakness    History of Present Illness: Wendy [name] is a 65 y.o. female admitted on 08/03/2020 with   Procedure(s):  ARTHROPLASTY, TOTAL, KNEE MAKOPLASTY    1 Day Post-Op  -------------------       Patient Active Problem List   Diagnosis    Fuchs' corneal dystrophy    Status post corneal transplant    Pseudophakia of left eye    Nuclear sclerosis of right eye    Dry eyes, bilateral    Blepharitis of both eyes    post DSEK, OS    Refractive error    Corneal graft rejection    ERM OS (epiretinal membrane, left eye)    PVD (posterior vitreous detachment)    OC (onychocryptosis)    Acquired keratoderma     Sciatica associated with disorder of lumbosacral spine    Acute pain of left knee    Primary osteoarthritis of left knee      Past Medical History:   Diagnosis Date    Brain tumor (benign)     Hypercholesteremia     Hypertensive disorder      Past Surgical History:   Procedure Laterality Date    BRAIN SURGERY  2009    BREAST BIOPSY Right 2008    right breast biopsy- benign    BREAST BIOPSY Right 1970s    right breast tumor removed 40+ years ago - benign    BREAST CYST EXCISION  1970s    right breast tumor removed 40+ years ago - benign    CESAREAN SECTION      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, CORNEAL IMPLANT Left     EYE SURGERY      TUBAL LIGATION           Prior Level of Function/Social History:  Prior Level of Function  Prior level of function: Independent with ADLs;Ambulates independently  Baseline Activity Level: Community ambulation  Driving: independent  Dressing - Upper Body: independent  Dressing - Lower Body: independent  Cooking: Yes  Feeding: independent  Bathing: independent  Grooming: independent  Toileting: independent  Employment: Retired (takes  care of 10 mo daughter)  DME Currently at Home:  (none)  Home Living Arrangements  Living Arrangements: Children (staying with son, then returning to [facility])  Type of Home: House  Home Layout: Two level  Bathroom Shower/Tub: Medical sales representative: Raised  Bathroom Equipment: Grab bars around toilet;Shower chair  DME Currently at Home:  (none)    Subjective:   Patient is agreeable to participation in the therapy session. Nursing clears patient for therapy.      Pain Assessment  Pain Assessment: Numeric Scale (0-10)  Pain Score: 8-severe pain  POSS Score: Awake and Alert  Pain Location: Knee  Pain Orientation: Left  Pain Frequency: Continuous;Increases with movement  Effect of Pain on Daily Activities: moderate  Patient's Stated Comfort Functional Goal: 0-No pain  Pain Intervention(s): Repositioned;Cold applied       Objective:        Patient is in bed with dressings and peripheral IV in place.      Current Level of Function:  Cognitive Status and Neuro Exam:  Cognition/Neuro Status  Arousal/Alertness: Appropriate responses to stimuli  Attention Span: Appears intact  Orientation Level: Oriented X4  Memory: Appears intact  Following Commands: independent  Safety Awareness: independent  Insights: Fully aware of deficits  Problem Solving: Able to problem solve independently  Behavior: attentive;calm;cooperative  Motor Planning: intact  Coordination: intact         Musculoskeletal Examination  Gross ROM  Right Upper Extremity ROM: within functional limits  Left Upper Extremity ROM: within functional limits  Gross Strength  Right Upper Extremity Strength: within functional limits  Left Upper Extremity Strength: within functional limits          Activities of Daily Living  Self-care and Home Management  Eating: Independent  Grooming: Supervision;standing at sink  Bathing: Minimal Assist  UB Dressing: Independent  LB Dressing: Minimal Assist  Toileting: Supervision  Functional Transfers: Contact Guard Assist    Functional Mobility:  Mobility and Transfers  Scooting to EOB: Supervision  Supine to Sit: Supervision  Sit to Stand: Supervision  Functional Mobility/Ambulation: Supervision  PMP - Progressive Mobility Protocol   PMP Activity: Step 6 - Walks in Room  Distance Walked (ft) (Step 6,7): 20 Feet     Balance  Balance  Static Sitting Balance: Independent  Dyanamic Sitting Balance: Independent  Static Standing Balance: Supervision  Dynamic Standing Balance: Supervision    Participation and Activity Tolerance  Participation Effort: good  Endurance: Tolerates 10 - 20 min exercise with multiple rests        Assessment:   Patient History - Chart reviewed, no additional research required.      Examination - (Low = 1-3 performance deficits related to plan of care; Mod = 3-5; High = 5 or more)  Examination reveals impairments in the following body systems:  Assessment: balance deficits;decreased independence with ADLs;decreased independence with IADLs;decreased endurance/activity tolerance.      Clinical Decision Making - Detailed assessment(s) required and presence of a few comorbidities or other factors that affect plan of care requiring modification of task including new post-operative precautions, stairs, high pain level       Prognosis: Good    Plan:   Treatment Interventions: ADL retraining;Patient/Family training;Compensatory technique education       OT Frequency Recommended: 3-4x/wk       Goals:      Goals  Goal Formulation: Patient  Time For Goal Achievement: 5 visits  Goals: Select goal  Patient will dress lower body:  Modified Independent;with AE;without AE;5 visits  Pt will complete bathing: Modified Independent;5 visits  Patient will toilet: Modified Independent;5 visits  Pt will transfer bed to toilet: Modified Independent;with rolling walker;5 visits    Treatment:   Self Care: (time  15 minutes )  Pt trained on LB dressing with AE. Demo's good understanding. Min A overall with reacher. Dressing performed with brace on 2/2 pain. Pt educated on dressing without brace and expect pt to have increased I sans brace. SPV for toileting with elevated toilet and grab bars. Increased time required for all activities.        Education:   Educated patient to role of occupational therapy, plan of care, home safety, next appropriate level of care, safety with mobility and ADLs, home safety.   Demonstrated good understanding. Also discussed goals of therapy and are in agreement with plan.        Interdisciplinary Communication   Patient is in bedside chair bed/chair alarm set, and call bell within reach. Communicated with nurse via FTF regarding pt status and eval outcome and pain.            Signature: Rocco Pauls, OT

## 2020-08-04 NOTE — Progress Notes (Signed)
[facility])   (P): [phone]   (F): [phone]    Note:  Standard Front Wheel Walker for home use delivered to patient in their hospital room.

## 2020-08-04 NOTE — Op Note (Signed)
Procedure Date: 08/03/2020     Patient Type: A     SURGEON: Abe People MD  ASSISTANT:       ASSISTANTLonn Georgia, Ahra     PREOPERATIVE DIAGNOSES:  Severe osteoarthritis of the left knee with a valgus deformity.     POSTOPERATIVE DIAGNOSES:  Severe osteoarthritis of the left knee with a valgus deformity.     TITLE OF PROCEDURE:  Left total knee replacement with robotic arm assisted surgery or  makoplasty.     ANESTHESIA:  Spinal and regional block.     TOURNIQUET TIME:  95 minutes.     ESTIMATED BLOOD LOSS:  100 mL,     FLUIDS:   2500 mL of crystalloid.     COMPLICATIONS:  None.     DRAINS:  None.     IMPLANTS:  I used [employer] with the Triathlon cruciate-retaining femoral component size  3, Triathlon X3 tibial bearing insert CS size 4 with the thickness 9 mm  were put on the Triathlon primary tibial baseplate, size 4, Triathlon X3  asymmetric patella size 29 with the thickness 9 mm.     INDICATIONS FOR PROCEDURE:  Ms. Wendy [name] who is a 65 year old black female with severe arthritis of the  left knee and with the valgus deformity.  The patient failed all  conservative treatment.  The patient now wished to have the surgery as  mentioned above.     DESCRIPTION OF PROCEDURE:  After preoperative consent was done, the patient was brought to the OR and  placed in the supine position.  The patient received 2 grams Ancef   preoperatively for infection prophylaxis.  The patient underwent the  regional anesthesia by the anesthesiologist in the preoperative area with a  high adductor block.  She underwent the spinal anesthesia in the OR.  The  left leg was then prepped and draped in the usual sterile fashion.     A pause was made to identify the patient and the site of the surgery as  well the procedure.  We then exsanguinate the left leg.  I used tourniquet  on the proximal thigh.  The tourniquet was inflated to 300 mmHg.  The  incision about 12 to 14 cm was made on the anterior aspect of the knee.   The incision was carried to the  soft tissue.  The arthrotomy was performed  with incision along the medial side of the quadriceps tendon extended  distally along the patellar tendon.  The patella was everted.  The knee was  bent to 90 degrees.  The patient had significant arthritis on the lateral  compartment and the patellofemoral compartment and moderate arthritis on  the medial compartment.     The first step is to hook up apparatus on the distal femur and the midshaft  of the tibia by using the Schanz screw, 4.0-mm; 2 Schanz screws hooked up  an apparatus to communicate with the robot. A set screw was then put on the  proximal tibia and one to put on the distal femur.  We went to rest  the  center hip rotation and the medial and lateral malleolus of the ankle to  determine the mechanical axis.  I then registered all of the surface on the  distal femur and the proximal tibia per protocol and determined by the  software.     The next step is to balance the flexion and extension gap.  I then used the  rongeur to remove  all the bone spur and took the knee straight.  The  patient had some flexion contraction about 8 to 10 degrees.  She also had  the ligament with balance both flexion and extension.  We then adjusted  with the cutter to balance the flexion and extension.  The extension and  flexion gap was about 18 to 19 mm, which equal both flexion and extension  gap.  I put the slope on the tibial cut to about 3-degree posterior slope.     The robot was brought back to the knee and I cut the proximal tibial cut.   The cut was performed without any problem.  I then checked the flexion and  extension gap again; it shows satisfactory equal flexion and extension gap.   We then proceeded with the distal femur cut.  The robot was brought in and  the distal femoral cut was performed with the distal cut, anterior,  posterior, chamfer, and anterior-posterior cut.  We then excised both  medial and lateral meniscus and then trialed with a size 4 for the  tibia  and size 3 for the femur.  The liner was put in with a size 9 mm thickness.   The patient have a little bit tight on the flexion.  I then released a  little bit on the insertion of the PCL on the tibial side.  We then  prepared reaming for the distal femoral cut.  Then, with the proximal  medial cut preparation.  I washed the wound carefully.  I then measured the  thickness of the patella and  cut the patella to resurface the patella.  I  used a size 29 with a 9-mm thickness.  The precut of the patella was  measured about 22 and after cut the thickness of patella was about 13.  We  then mixed cement with antibiotics.  We washed the wound carefully, and I  put the cement on.  We put the primary tibial baseplate, size 4, the tibia  implant was impacted, followed with the tibia insert size 4 CS with a 9-mm  thickness and the femoral component with a size 3.  We used the cruciate  retaining femoral component.  Excess cement was removed.  I then put the  patellar component in with a size 29 and compressed the patella.  Again,  this excess cement was removed.  I then washed the wound and waited for the  cement to get hard.  The knee taken through range of motion.  I did some  partial lateral release of the patella to balance the alignment of the  patellofemoral compartment.  The tourniquet was released, showed 95  minutes.  At this point, I gave 1000 mg of the tranexamic acid for bleeding  control.  This patient also received 1000 mg at the beginning of the case.   I washed the wound carefully.  I closed the wound by using #1 Ethibond, 2-0  Vicryl and 3-0 Monocryl subcuticular.  For the Schanz screw, I closed by  using 3-0 Monocryl and Dermabond.  Dry dressing in place.  The patient  tolerated the procedure.  I put the patient on the knee immobilizer.  She  was awakened, transferred to the PACU without complication.           D:  08/04/2020 18:59 PM by Dr. Abe People, MD ([id number])  T:  08/04/2020 20:00 PM by NTS       Everlean Cherry: [id number]) (Doc ID: [id number])

## 2020-08-04 NOTE — Progress Notes (Addendum)
Wendy [name] is a 65 y.o. female patient.  Principal Problem:    Primary osteoarthritis of left knee    Past Medical History:   Diagnosis Date    Brain tumor (benign)     Hypercholesteremia     Hypertensive disorder      Current Facility-Administered Medications   Medication Dose Route Frequency Provider Last Rate Last Admin    0.9% NaCl infusion   Intravenous Continuous Abe People, MD 85 mL/hr at 08/04/20 0458 New Bag at 08/04/20 0458    aspirin EC tablet 325 mg  325 mg Oral BID Norlene Campbell B, MD   325 mg at 08/04/20 0901    bisacodyl (DULCOLAX) suppository 10 mg  10 mg Rectal Daily PRN Abe People, MD        diphenhydrAMINE (BENADRYL) capsule 25 mg  25 mg Oral Q12H PRN Norlene Campbell B, MD        docusate sodium (COLACE) capsule 100 mg  100 mg Oral BID Norlene Campbell B, MD        ferrous sulfate 324 mg-ascorbic acid 500 mg combo dose   Oral Daily Abe People, MD   Given at 08/04/20 0901    hydroCHLOROthiazide (HYDRODIURIL) tablet 12.5 mg  12.5 mg Oral Daily Norlene Campbell B, MD   12.5 mg at 08/04/20 0901    HYDROcodone-acetaminophen (NORCO) 5-325 MG per tablet 1 tablet  1 tablet Oral Q4H PRN Norlene Campbell B, MD   1 tablet at 08/03/20 2038    HYDROmorphone (DILAUDID) tablet 2 mg  2 mg Oral Q4H PRN Norlene Campbell B, MD   2 mg at 08/04/20 1308    lactated ringers infusion   Intravenous Continuous Tillie Rung, MD 20 mL/hr at 08/03/20 1200 3,000 mL at 08/03/20 1331    lisinopril (ZESTRIL) tablet 20 mg  20 mg Oral Daily Norlene Campbell B, MD   20 mg at 08/04/20 0901    metoclopramide (REGLAN) tablet 5 mg  5 mg Oral Q6H PRN Abe People, MD        Or    metoclopramide (REGLAN) injection 5 mg  5 mg Intravenous Q6H PRN Norlene Campbell B, MD   5 mg at 08/03/20 1845    naloxone (NARCAN) injection 0.4 mg  0.4 mg Intravenous PRN Norlene Campbell B, MD        ondansetron (ZOFRAN-ODT) disintegrating tablet 4 mg  4 mg Oral Q8H PRN Norlene Campbell B, MD        Or    ondansetron (ZOFRAN) injection 4 mg  4 mg Intravenous Q8H PRN Abe People, MD         oxyCODONE-acetaminophen (PERCOCET) 5-325 MG per tablet 1 tablet  1 tablet Oral Q4H PRN Norlene Campbell B, MD   1 tablet at 08/04/20 0901    oxyCODONE-acetaminophen (PERCOCET) 5-325 MG per tablet 2 tablet  2 tablet Oral Q4H PRN Norlene Campbell B, MD        prednisoLONE acetate (PRED FORTE) 1 % ophthalmic suspension 1 drop  1 drop Left Eye QID Norlene Campbell B, MD   1 drop at 08/04/20 0906    sodium chloride (PF) 0.9 % injection 3 mL  3 mL Intravenous Q8H Norlene Campbell B, MD   3 mL at 08/04/20 0112    traMADol (ULTRAM) tablet 50 mg  50 mg Oral 4 times per day Abe People, MD   50 mg at 08/04/20 6578     Allergies   Allergen Reactions  Iodine Shortness Of Breath    Penicillins Shortness Of Breath     Has patient had a PCN reaction causing immediate rash, facial/tongue/throat swelling, SOB or lightheadedness with hypotension: Yes  Has patient had a PCN reaction causing severe rash involving mucus membranes or skin necrosis: No  Has patient had a PCN reaction that required hospitalization: No  Has patient had a PCN reaction occurring within the last 10 years: Yes  If all of the above answers are "NO", then may proceed with Cephalosporin use.    Shellfish-Derived Products Anaphylaxis    Atorvastatin      Joint pain     Morphine      Itchy     Tetanus Immune Globulin      Arm swelling     Zocor [Simvastatin]      No response      Blood pressure 126/70, pulse 76, temperature 98.2 F (36.8 C), temperature source Oral, resp. rate 15, height 1.613 m (5' 3.5"), weight 77.5 kg (170 lb 14.4 oz), SpO2 100 %.    Subjective:  Symptoms:  Stable.  She reports malaise and weakness.  No anxiety.    Diet:  Adequate intake.  No nausea or vomiting.    Activity level: Impaired due to pain.    Pain:  She complains of pain that is moderate.  She reports pain is improving.  Pain is partially controlled.    Review of Systems   Constitutional: Positive for malaise/fatigue. Negative for fever.   HENT: Negative for congestion, ear discharge,  nosebleeds, sinus pain and tinnitus.    Eyes: Negative for blurred vision and photophobia.   Respiratory: Negative for sputum production.    Cardiovascular: Negative for palpitations and claudication.   Gastrointestinal: Negative for blood in stool, constipation, nausea and vomiting.   Genitourinary: Negative for hematuria and urgency.   Musculoskeletal: Positive for joint pain.   Neurological: Positive for weakness. Negative for dizziness and headaches.   Endo/Heme/Allergies: Does not bruise/bleed easily.   Psychiatric/Behavioral: Negative for depression, memory loss and suicidal ideas. The patient does not have insomnia.          Objective:  [name] Appearance:  Comfortable, ill-appearing, in no acute distress and in pain.    Vital signs: (most recent): Blood pressure 126/70, pulse 76, temperature 98.2 F (36.8 C), temperature source Oral, resp. rate 15, height 1.613 m (5' 3.5"), weight 77.5 kg (170 lb 14.4 oz), SpO2 100 %.  No fever.    Output: Producing urine and producing stool.    HEENT: Normal HEENT exam.    Lungs:  Normal effort and normal respiratory rate.  Breath sounds clear to auscultation.  There are decreased breath sounds.  No wheezes or rhonchi.    Heart: Normal rate.  Regular rhythm.  S1 normal.  No murmur, gallop or friction rub.   Chest: Symmetric chest wall expansion. No chest wall tenderness.    Abdomen: Abdomen is soft.  Bowel sounds are normal.   There is no abdominal tenderness.  There is no rebound tenderness.  There is no guarding.   There is no mass. There is no splenomegaly. There is no hepatomegaly.   Extremities: Decreased range of motion.  There is no dependent edema or local swelling.  (Left knee wrapped up distal neurovascular intact)  Pulses: Distal pulses are intact.    Neurological: Patient is alert and oriented to person, place and time.  Patient has normal reflexes, normal muscle tone and normal coordination.    Pupils:  Pupils are equal, round, and reactive to light.    Skin:   Warm, dry and pale.  No rash, ecchymosis, cyanosis or ulceration.   MDM  Number of Diagnoses or Management Options  Primary osteoarthritis of left knee: established, improving     Amount and/or Complexity of Data Reviewed  Clinical lab tests: reviewed  Tests in the radiology section of CPT: reviewed  Tests in the medicine section of CPT: reviewed  Discussion of test results with the performing providers: yes  Decide to obtain previous medical records or to obtain history from someone other than the patient: yes  Obtain history from someone other than the patient: yes  Review and summarize past medical records: yes  Discuss the patient with other providers: yes  Independent visualization of images, tracings, or specimens: yes    Risk of Complications, Morbidity, and/or Mortality  Presenting problems: high  Diagnostic procedures: high  Management options: high    Critical Care  Total time providing critical care: 30-74 minutes    Patient Progress  Patient progress: stable    Assessment:  (Severe osteoarthritis of the left knee  Left total knee arthroplasty Dr. Norlene Campbell 08/03/2020  Hypertension  Hyperlipidemia  Fuchs dystrophy of the left cornea  Pseudophakia left eye  Sciatica lumbar spine  Insomnia  Osteoporosis).     Plan:   (Postoperative day 1  Patient walked with PT yesterday however today she has extreme pain  She is seen by nurse anesthesia  She says Dilaudid tramadol and Norco does not help  Percocet does help which she will increase it to 2 tablets  Patient had bowel movement  Await PT OT    Addendum  Patient still has a lot of pain  We will keep overnight for ambulation fall prevention gait training neuro reeducation  Discussed with Dr. Laveda Norman).     This note was generated by the Epic EMR system/ Dragon speech recognition and may contain inherent errors or omissions not intended by the user. Grammatical errors, random word insertions, deletions, pronoun errors and incomplete sentences are occasional  consequences of this technology due to software limitations. Not all errors are caught or corrected. If there are questions or concerns about the content of this note or information contained within the body of this dictation they should be addressed directly with the author for clarification  Lenox Ponds, MD  08/04/2020

## 2020-08-04 NOTE — Plan of Care (Addendum)
Alert and oriented x4, vss. Pain managed with po meds patient has tried all three alternatives but says there's little change. Will stick with percocet for  Now and rates pain at 5-7 at comfort level. Neurovascularly intact and moves all extremities. Up ambulating with knee immobilizer, walker and SBA. Knee ace wrap cdi. Encouraged to use IS when awake , call bell within reach. Continue with poc.  Problem: Safety  Goal: Patient will be free from injury during hospitalization  Flowsheets (Taken 08/04/2020 [phone])  Patient will be free from injury during hospitalization:   Assess patient's risk for falls and implement fall prevention plan of care per policy   Provide and maintain safe environment   Use appropriate transfer methods   Hourly rounding   Ensure appropriate safety devices are available at the bedside     Problem: Pain  Goal: Pain at adequate level as identified by patient  Flowsheets (Taken 08/04/2020 0659)  Pain at adequate level as identified by patient:   Identify patient comfort function goal   Assess for risk of opioid induced respiratory depression, including snoring/sleep apnea. Alert healthcare team of risk factors identified.   Assess pain on admission, during daily assessment and/or before any "as needed" intervention(s)   Reassess pain within 30-60 minutes of any procedure/intervention, per Pain Assessment, Intervention, Reassessment (AIR) Cycle   Evaluate patient's satisfaction with pain management progress   Evaluate if patient comfort function goal is met   Offer non-pharmacological pain management interventions     Problem: Knee Surgery  Goal: Stable Neurovascular Status  Flowsheets (Taken 08/04/2020 0659)  Stable neurovascular status:   Assess and document plantar/dorsiflexion every 4 hours   Monitor/assess for return of sensation after nerve block therapy if indicated   Monitor/assess neurovascular status (pulses, capillary refill, pain, paresthesia, presence of edema)   VTE  prevention: administer anticoagulant(s) and/or apply anti-embolism stockings/devices as ordered  Goal: Mobility/activity is maintained at optimum level for patient  Flowsheets (Taken 08/04/2020 0659)  Mobility/activity is maintained at optimum level for patient:   Administer analgesics as prescribed to achieve pain goal   Dangle/stand at bedside if indicated with assistance as needed   Out of bed to chair if indicated with assistance as needed   Ambulate equal to or greater than 50 feet   Teach/review/reinforce knee precautions with patient/patient care companion (no pillow under knee, lock out knee flexion feature on bed)   Teach/review/reinforce exercises (ankle pumps, quad sets, gluteal sets)   Review weight bearing status with patient/patient care companion

## 2020-08-04 NOTE — PT Eval Note (Signed)
[facility]  Physical Therapy Evaluation    Patient: Wendy [name] MRN: [id number]   Unit: 5NEW ORTHOPEDICS    Bed: V409/W119-14      Attention Physicians:  For patients in an observation or outpatient status, Medicare requires the ordering provider to certify that this service is medically necessary.  Please co-sign this evaluation to indicate your agreement.  Thank you.      Discharge Recommendation: Home with HHPT    DME Recommended for Discharge: RW    Recommended  (non-medical) mode of transport: Car    Assessment:   Patient is independent w/ all assessed activities. No continued inpatient physical therapy needs.   Discharge therapy.      PMP - Progressive Mobility Protocol   PMP Activity: Step 7 - Walks out of Room  Distance Walked (ft) (Step 6,7): 50 Feet        Interdisciplinary Communication:   Patient is in chair with alarm activated; call bell within reach.  Updated white communication board in room with patient's current mobility status. Spoke with RN regarding results of evaluation.    Plan:   Goals:  All physical therapy goals met on evaluation.  Discontinue PT.        Education:   Educated patient on role of physical therapy and no further needs for inpatient PT.  Patient verbalized understanding and in agreement with discharge.    Evaluation:   Consult received for [name] for PT evaluation and treatment.  Chart reviewed.  Patient's medical condition is appropriate for Physical Therapy intervention at this time.     Medical Diagnosis: Primary osteoarthritis of one knee, left [M17.12]  Primary osteoarthritis of left knee [M17.12]    Therapy Diagnosis: difficulty walking, generalized muscle weakness      PPE worn: procedural mask, goggles  and gloves           Precautions  Weight Bearing Status: LLE WBAT  Total Knee Replacement: knee immobilizer;OOB  Precaution Instructions Given to Patient: Yes  Other Precautions: falls    History of Present Illness: Wendy [name] is a 65 y.o. female  admitted on 08/03/2020 with   Procedure(s):  ARTHROPLASTY, TOTAL, L KNEE MAKOPLASTY    1 Day Post-Op  -------------------       Patient Active Problem List   Diagnosis    Fuchs' corneal dystrophy    Status post corneal transplant    Pseudophakia of left eye    Nuclear sclerosis of right eye    Dry eyes, bilateral    Blepharitis of both eyes    post DSEK, OS    Refractive error    Corneal graft rejection    ERM OS (epiretinal membrane, left eye)    PVD (posterior vitreous detachment)    OC (onychocryptosis)    Acquired keratoderma    Sciatica associated with disorder of lumbosacral spine    Acute pain of left knee    Primary osteoarthritis of left knee     Past Medical History:   Diagnosis Date    Brain tumor (benign)     Hypercholesteremia     Hypertensive disorder      Past Surgical History:   Procedure Laterality Date    BRAIN SURGERY  2009    BREAST BIOPSY Right 2008    right breast biopsy- benign    BREAST BIOPSY Right 1970s    right breast tumor removed 40+ years ago - benign    BREAST CYST EXCISION  1970s  right breast tumor removed 40+ years ago - benign    CESAREAN SECTION      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, CORNEAL IMPLANT Left     EYE SURGERY      TUBAL LIGATION                 X-Rays/Tests/Labs:    Radiology Results (24 Hour)     Procedure Component Value Units Date/Time    X-ray knee left AP and lateral [[id number]] Collected: 08/03/20 1700    Order Status: Completed Updated: 08/03/20 1703    Narrative:      History: Post left total knee arthroplasty.    FINDINGS:  AP and lateral views. 1604.  Normal postoperative findings are present. There is no fracture,  loosening, or dislocation. Expected minimal swelling and air is seen in  the adjacent soft tissues.      Impression:       Normal postop left total knee arthroplasty.    Wilmon Pali, MD   08/03/2020 5:01 PM                Prior Level of Function   Prior level of function: Ambulates / Performs ADL's independently    Baseline Activity Level: Community ambulation   DME Currently at Home: none      Home Living Arrangements    home w/ family    Subjective: Patient is agreeable to participation in the therapy session.         Pain Assessment  10/10 L Knee, cold applied        Objective:  Observation of patient/vitals    Cognition: grossly intact    Inspection/Posture: [facility]    Musculoskeletal Examination  Gross ROM: Within functional limits R LE, 0-20 L LE  Gross Strength: Within functional limits R LE, 2/5 L LE      Sensation: grossly intact      Functional Mobility  Transfers: independent  Ambulation: 64' independent w/ RW  Stairs: 2 independent w/ RW      Balance: within functional limits    Participation and Endurance   Participation Effort: excellent   Endurance: Endurance does not limit participation in activity    Time Calculation  PT Received On: 08/04/20  Start Time: 1100  Stop Time: 1128  Time Calculation (min): 28 min          Signature: Jacqulyn Cane, PT, DPT

## 2020-08-04 NOTE — Progress Notes (Signed)
Situation - Admitted on 08/03/20 for elective surgery; POD #1;   Operative Procedure:   Procedure(s):  ARTHROPLASTY, TOTAL, KNEE MAKOPLASTY     Preoperative Diagnosis:   Pre-Op Diagnosis Codes:     * Primary osteoarthritis of one knee, left [M17.12]     Postoperative Diagnosis:   Post-Op Diagnosis Codes:     * Primary osteoarthritis of one knee, left [M17.12]    Lace score 1      Background - Independent prior to admission with no hx of placement; lives between her daughter's home in [place], [place] and her son's home in [place], [place]. She is staying with her son post discharge before returning back to [place].    DME: none    Transportation: car      Assessment - Case Manager met with patient in room to introduce role and discuss goals of care. Demographics verified on facesheet. Awaiting PT evaluation at time of interview. She stated she will need a walker for home (and a cane and a wheelchair). Provide patient with list of wheelchair rental companies, but stated that we can provide her with a RW and arrange home health PT for her. Patient would have to purchase cane if recommended by PT as these are not provided by the hospital.    Discharge Barriers identified upon initial assessment:  None       Recommendation - Home with home health PT (arranged by [facility]), RW and follow up with surgeon when indicated.     No further case management needs identified at this time.        08/04/20 0845   Patient Type   Within 30 Days of Previous Admission? No   Healthcare Decisions   Interviewed: Patient   Orientation/Decision Making Abilities of Patient Alert and Oriented x3, able to make decisions   Additional Emergency Contacts? Son Alvira Philips [phone]   Prior to admission   Prior level of function Independent with ADLs   Type of Residence Private residence   Home Layout Two level   How do you get to your MD appointments? Self   How do you get your groceries? Self   Who fixes your meals? Self   Who does your  laundry? Self   Who picks up your prescriptions? Self   Name of Prior Assisted Living Facility No   Prior SNF admission? (Detail) No   Prior Rehab admission? (Detail) No   Discharge Planning   Patient expects to be discharged to: Son's home in [place]   Anticipated [place] plan discussed with: Same as interviewed   Mode of transportation: Private car (family member)   Does the patient have perscription coverage? Yes   Financial Resource Strain   How hard is it for you to pay for the very basics like food, housing, medical care, and heating? Not hard   Housing Stability   In the last 12 months, was there a time when you were not able to pay the mortgage or rent on time? N   In the last 12 months, how many places have you lived? 2  (Splits time between her son's and daughter's home.)   Consults/Providers   Outcome Palliative Care Screen Screened but did not meet criteria for intervention   Correct PCP listed in Epic? Yes   Important Message from Medicare Notice   Patient received 1st IMM Letter? n/a         Emiliano Dyer, RN  Clinical Case Manager  [phone]

## 2020-08-05 LAB — BASIC METABOLIC PANEL
Anion Gap: 8 (ref 5.0–15.0)
BUN: 9 mg/dL (ref 7–19)
CO2: 30 mEq/L — ABNORMAL HIGH (ref 22–29)
Calcium: 8.2 mg/dL — ABNORMAL LOW (ref 8.5–10.5)
Chloride: 96 mEq/L — ABNORMAL LOW (ref 100–111)
Creatinine: 0.6 mg/dL (ref 0.6–1.0)
Glucose: 118 mg/dL — ABNORMAL HIGH (ref 70–100)
Potassium: 3 mEq/L — ABNORMAL LOW (ref 3.5–5.1)
Sodium: 134 mEq/L — ABNORMAL LOW (ref 136–145)

## 2020-08-05 LAB — GFR: EGFR: >60

## 2020-08-05 MED ORDER — POTASSIUM CHLORIDE CRYS ER 20 MEQ PO TBCR
40.0000 meq | EXTENDED_RELEASE_TABLET | Freq: Once | ORAL | Status: AC
Start: 2020-08-05 — End: 2020-08-05
  Administered 2020-08-05: 07:00:00 40 meq via ORAL
  Filled 2020-08-05: qty 2

## 2020-08-05 NOTE — Plan of Care (Signed)
All goals completed adequately for discharge.    Problem: Safety  Goal: Patient will be free from injury during hospitalization  Outcome: Adequate for Discharge  Goal: Patient will be free from infection during hospitalization  Outcome: Adequate for Discharge     Problem: Pain  Goal: Pain at adequate level as identified by patient  Outcome: Adequate for Discharge     Problem: Side Effects from Pain Analgesia  Goal: Patient will experience minimal side effects of analgesic therapy  Outcome: Adequate for Discharge     Problem: Discharge Barriers  Goal: Patient will be discharged home or other facility with appropriate resources  Outcome: Adequate for Discharge     Problem: Psychosocial and Spiritual Needs  Goal: Demonstrates ability to cope with hospitalization/illness  Outcome: Adequate for Discharge     Problem: Moderate/High Fall Risk Score >5  Goal: Patient will remain free of falls  Outcome: Adequate for Discharge     Problem: Knee Surgery  Goal: Hemodynamic Stability  Outcome: Adequate for Discharge  Goal: Pain at adequate level as identified by patient  Outcome: Adequate for Discharge  Goal: Stable Neurovascular Status  Outcome: Adequate for Discharge  Goal: Free from Infection  Outcome: Adequate for Discharge  Goal: Mobility/activity is maintained at optimum level for patient  Outcome: Adequate for Discharge  Goal: Patient will maintain normal GI status  Outcome: Adequate for Discharge  Goal: Address patient self-management plan  Outcome: Adequate for Discharge  Goal: Patient/Patient Care Companion demonstrates understanding of disease process, treatment plan, medications, and discharge plan  Outcome: Adequate for Discharge

## 2020-08-05 NOTE — Progress Notes (Signed)
Joint Navigator met with patient POD # 2 from left total knee eplacment  Pain: 2/10  Ambulation: Out of room with assistant and two wheeled walker    ASSESSMENT:  Orientation: A&Ox4  Sensation: BLE intact to light touch  Strength: BLE able to overcome resistance  Voiding: Adequate  Dressing: C/D/I- ace wrap in place- ice packs in place    RECENT VITAL SIGNS:  @ 0801  T 98.2  HR 94  RR 16  BP 119/73    Temperature over past 24 hours:  Min-97.9  Max-98.8    EDUCATION:  Educated patient on use and importance of IS in preventing post op PNA, ankle pumps in preventing DVT, ambulation and activity levels, pain management, surgical precautions     DISCHARGE PLANNING:   Discharge likely today  Patient has been evaluated by PT/OT  Patient has son for support

## 2020-08-05 NOTE — Discharge Summary (Signed)
[facility]      Patient: Wendy [name]  Admission Date: 08/03/2020   DOB: [birth year]  Discharge Date: 08/05/2020    MRN: [id number]  Discharge Attending: [place] Ponds MD   Referring Physician: Windell Moment, MD  PCP: Wendy Moment, MD       DISCHARGE SUMMARY     Discharge Information   Admission Diagnosis:   Primary osteoarthritis of one knee, left [M17.12]  Primary osteoarthritis of left knee [M17.12]  Post-op pain [G89.18]    Discharge Diagnosis:Severe osteoarthritis of the left knee  Left total knee arthroplasty Wendy [name] 08/03/2020  Hypertension  Hyperlipidemia  Fuchs dystrophy of the left cornea  Pseudophakia left eye  Sciatica lumbar spine  Insomnia  Osteoporosis).    Patient Active Problem List    Diagnosis Date Noted    Post-op pain 08/04/2020    Primary osteoarthritis of left knee 08/03/2020    Sciatica associated with disorder of lumbosacral spine 04/20/2016    Acute pain of left knee 04/20/2016    OC (onychocryptosis) 09/07/2015    Acquired keratoderma 09/07/2015    PVD (posterior vitreous detachment) 12/09/2014    ERM OS (epiretinal membrane, left eye) 09/22/2014    Corneal graft rejection 11/03/2013    post DSEK, OS 06/30/2013    Refractive error 06/30/2013    Blepharitis of both eyes 06/02/2013    Dry eyes, 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/2013    Fuchs' corneal dystrophy 01/27/2013    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    Pseudophakia of left eye 01/27/2013    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        Admission Condition: Unstable  Discharge Condition: Stable  Functional Status: Baseline needs PT OT home instructions given    Discharge Medications:     Medication List      START taking these medications    aspirin EC 325 MG tablet  Take 1 tablet (325 mg total) by mouth 2 (two) times daily     docusate sodium 100 MG capsule  Commonly known as: COLACE  Take 1 capsule (100 mg total) by mouth 2 (two) times daily        CHANGE how you take these  medications    oxyCODONE-acetaminophen 5-325 MG per tablet  Commonly known as: PERCOCET  Take 2 tablets by mouth every 4 (four) hours as needed for Pain  What changed:    how much to take   how to take this   when to take this   reasons to take this     triamcinolone 0.025 % ointment  Commonly known as: KENALOG  Apply topically 2 (two) times daily  What changed:    when to take this   reasons to take this        CONTINUE taking these medications    acetaZOLAMIDE 125 MG tablet  Commonly known as: DIAMOX     co-enzyme Q-10 50 MG capsule     ergocalciferol 1.25 MG (50000 UT) capsule  Commonly known as: Drisdol  Take 1 capsule (50,000 Units total) by mouth once a week     fluticasone 50 MCG/ACT nasal spray  Commonly known as: FLONASE  1 spray by Nasal route as needed for Rhinitis     hydroCHLOROthiazide 12.5 MG tablet  Commonly known as: HYDRODIURIL  Take 1 tablet (12.5 mg total) by mouth daily     ibuprofen 600 MG tablet  Commonly known as: ADVIL  Take 1 tablet (600 mg total) by mouth  every 6 (six) hours as needed for Pain     lisinopril 20 MG tablet  Commonly known as: ZESTRIL  Take 1 tablet (20 mg total) by mouth daily     multivitamin tablet     nortriptyline 10 MG capsule  Commonly known as: PAMELOR  Take 1 capsule (10 mg total) by mouth as needed (sleep)     prednisoLONE acetate 1 % ophthalmic suspension  Commonly known as: PRED FORTE  Place 1 drop into the left eye 4 (four) times daily     rosuvastatin 40 MG tablet  Commonly known as: CRESTOR  1 tab qod     traMADol 50 MG tablet  Commonly known as: ULTRAM           Where to Get Your Medications      These medications were sent to [facility] - [place], [place] - [address]  [address], [place] [address]    Phone: [phone]    aspirin EC 325 MG tablet   docusate sodium 100 MG capsule     You can get these medications from any pharmacy    Bring a paper prescription for each of these  medications   oxyCODONE-acetaminophen 5-325 MG per tablet             Hospital Course   Presentation History   After patient was seen in preop she had instructions by anesthesia Wendy [name] saw her  There after she had the operation and from PACU she was transferred to the surgical floor    See HPI for details.    Hospital Course (0 Days)     Postoperative day 1 patient had a lot of pain Percocet was doubled her nausea was better thereafter PT OT saw her and she walked with them she is doing much better now pain is controlled I did tell her that she could get constipation and should take MiraLAX if needed she will see her PCP as well as her surgeon for follow-up    Procedures/Imaging:   X-ray knee left AP and lateral   Final Result    Normal postop left total knee arthroplasty.      Wendy Pali, MD    08/03/2020 5:01 PM      Wendy [name] FOR ANESTHESIA   Final Result          Treatment Team:   Attending Provider: Abe People, MD               Progress Note/Physical Exam at Discharge     Subjective: Patient feels much better the left knee is better    Vitals:    08/04/20 2040 08/04/20 2321 08/05/20 0401 08/05/20 0801   BP: 106/60 131/79 106/65 119/73   Pulse: 84 72 90 94   Resp: 16 18 16 16    Temp: 98.1 F (36.7 C) 98.2 F (36.8 C) 98.8 F (37.1 C) 98.2 F (36.8 C)   TempSrc: Oral Oral Oral Oral   SpO2: 100% 99% 96% 94%   Weight:       Height:           General: NAD, AAOx3  HEENT: perrla, eomi, sclera anicteric, OP: Clear, MMM  Neck: supple, FROM, no LAD  Cardiovascular: RRR, no m/r/g  Lungs: CTAB, no w/r/r  Abdomen: soft, +BS, NT/ND, no masses, no g/r  Extremities: no C/C/E wound on the left knee is dressed up there is no bleeding oozing distal neurovascular is  intact  Skin: no rashes or lesions noted  Neuro: CN 2-12 intact; No Focal neurological deficits       Diagnostics     Labs/Studies Pending at Discharge: No    Last Labs   @[email or web address] ,    @[email or web address]     Microbiology Results (last 15 days)     ** No  results found for the last 360 hours. **           Patient Instructions   Discharge Diet: regular diet  Discharge Activity:  activity as tolerated with PT OT and instructions given    Follow Up Appointment:   Follow-up Information     Wendy People, MD. Go on 08/11/2020.    Specialty: Orthopaedic Surgery  Why: Post Op Appointment at 4:00 PM  Contact information:  [address]  [address] [place] [address]  [phone]             Wendy Moment, MD Follow up in 1 week(s).    Specialty: Internal Medicine  Contact information:  [address]  [place] [address]  [phone]                          Time spent examining patient, discussing with patient/family regarding hospital course, chart review, reconciling medications and discharge planning  >35 min.  Follow up with PCP in 2 days  Signed,  Wendy Ponds, MD  10:02 AM 08/05/2020     This note was generated by the Epic EMR system/ Dragon speech recognition and may contain inherent errors or omissions not intended by the user. Grammatical errors, random word insertions, deletions, pronoun errors and incomplete sentences are occasional consequences of this technology due to software limitations. Not all errors are caught or corrected. If there are questions or concerns about the content of this note or information contained within the body of this dictation they should be addressed directly with the author for clarification

## 2020-08-05 NOTE — Progress Notes (Signed)
Post-op Day 2    Date:  08/05/2020 Time:  9:44 AM      Visit Type:  Inpatient Room #   [phone]    Subjective:  No complaints    Type of block: Adductor canal single shot plus iPACK single shot   Surgery: ARTHROPLASTY, TOTAL, KNEE MAKOPLASTY      Objective:  Pain Score  2                    Motor Block  No                    Sensory Block  No                    Catheter site  N/A    Assessment:  Surgical pain controlled    Plan:  No further management    Start or continue oral pain medications   Yes, Percocet 5/325 mg 1-2 tablets every 4 hours prn.    Would pt receive block again?  Yes

## 2020-08-05 NOTE — Plan of Care (Addendum)
POD2: ARTHROPLASTY, TOTAL, KNEE MAKOPLASTY. AOX4. Medicated with PRN Percocet. Surgical dressing c/d/I, cold compress provided. Neurovascular status within normal limits. Assisted with ambulation in the room, LLE immobilizer and walker utilized. Encouraged IS use. SCD and foot pump on. Tolerating PO intake, has passed gas. In stable condition. Potassium level this AM was 3.0, Hospitalist Patel Piyush made aware, 40mg  Potassium PO given per order.     Problem: Safety  Goal: Patient will be free from injury during hospitalization  Outcome: Progressing  Flowsheets (Taken 08/04/2020 0659 by Christene Slates, RN)  Patient will be free from injury during hospitalization:   Assess patient's risk for falls and implement fall prevention plan of care per policy   Provide and maintain safe environment   Use appropriate transfer methods   Hourly rounding   Ensure appropriate safety devices are available at the bedside     Problem: Pain  Goal: Pain at adequate level as identified by patient  Outcome: Progressing  Flowsheets (Taken 08/04/2020 0659 by Christene Slates, RN)  Pain at adequate level as identified by patient:   Identify patient comfort function goal   Assess for risk of opioid induced respiratory depression, including snoring/sleep apnea. Alert healthcare team of risk factors identified.   Assess pain on admission, during daily assessment and/or before any "as needed" intervention(s)   Reassess pain within 30-60 minutes of any procedure/intervention, per Pain Assessment, Intervention, Reassessment (AIR) Cycle   Evaluate patient's satisfaction with pain management progress   Evaluate if patient comfort function goal is met   Offer non-pharmacological pain management interventions     Problem: Knee Surgery  Goal: Stable Neurovascular Status  Outcome: Progressing  Flowsheets (Taken 08/04/2020 0659 by Christene Slates, RN)  Stable neurovascular status:   Assess and document plantar/dorsiflexion every 4 hours    Monitor/assess for return of sensation after nerve block therapy if indicated   Monitor/assess neurovascular status (pulses, capillary refill, pain, paresthesia, presence of edema)   VTE prevention: administer anticoagulant(s) and/or apply anti-embolism stockings/devices as ordered

## 2020-08-05 NOTE — Discharge Summary -  Nursing (Signed)
Discussed discharge instructions with pt. Son present at bedside for discharge teaching, verbalized understanding. Pt knows when and how to take all medications at home as prescribed. Prescriptions were picked up by patient prior to admission. Pt will call and schedule an appointment with Dr. Raechel Ache in 2 weeks for follow-up. All questions were answered and pt knows where to call with any further concerns. IV removed, catheter intact. Pt left floor via wheelchair to lobby with no incident.

## 2020-08-05 NOTE — Progress Notes (Signed)
Wendy [name] is a 65 y.o. female patient.  Principal Problem:    Primary osteoarthritis of left knee  Active Problems:    Post-op pain    Past Medical History:   Diagnosis Date    Brain tumor (benign)     Hypercholesteremia     Hypertensive disorder      Current Facility-Administered Medications   Medication Dose Route Frequency Provider Last Rate Last Admin    0.9% NaCl infusion   Intravenous Continuous Abe People, MD   Stopped at 08/04/20 1000    aspirin EC tablet 325 mg  325 mg Oral BID Norlene Campbell B, MD   325 mg at 08/05/20 0856    bisacodyl (DULCOLAX) suppository 10 mg  10 mg Rectal Daily PRN Abe People, MD        diphenhydrAMINE (BENADRYL) capsule 25 mg  25 mg Oral Q12H PRN Norlene Campbell B, MD        docusate sodium (COLACE) capsule 100 mg  100 mg Oral BID Norlene Campbell B, MD   100 mg at 08/05/20 0856    ferrous sulfate 324 mg-ascorbic acid 500 mg combo dose   Oral Daily Abe People, MD   Given at 08/05/20 0856    hydroCHLOROthiazide (HYDRODIURIL) tablet 12.5 mg  12.5 mg Oral Daily Norlene Campbell B, MD   12.5 mg at 08/05/20 0856    HYDROcodone-acetaminophen (NORCO) 5-325 MG per tablet 2 tablet  2 tablet Oral Q4H PRN Darryll Capers R, MD        HYDROmorphone (DILAUDID) tablet 2 mg  2 mg Oral Q4H PRN Norlene Campbell B, MD   2 mg at 08/04/20 1610    lactated ringers infusion   Intravenous Continuous Tillie Rung, MD 20 mL/hr at 08/03/20 1200 3,000 mL at 08/03/20 1331    lisinopril (ZESTRIL) tablet 20 mg  20 mg Oral Daily Norlene Campbell B, MD   20 mg at 08/05/20 0856    metoclopramide (REGLAN) tablet 5 mg  5 mg Oral Q6H PRN Norlene Campbell B, MD        Or    metoclopramide (REGLAN) injection 5 mg  5 mg Intravenous Q6H PRN Norlene Campbell B, MD   5 mg at 08/03/20 1845    naloxone (NARCAN) injection 0.4 mg  0.4 mg Intravenous PRN Norlene Campbell B, MD        ondansetron (ZOFRAN-ODT) disintegrating tablet 4 mg  4 mg Oral Q8H PRN Norlene Campbell B, MD        Or    ondansetron (ZOFRAN) injection 4 mg  4 mg Intravenous Q8H PRN Abe People, MD        oxyCODONE-acetaminophen (PERCOCET) 5-325 MG per tablet 1 tablet  1 tablet Oral Q4H PRN Abe People, MD   1 tablet at 08/05/20 0701    oxyCODONE-acetaminophen (PERCOCET) 5-325 MG per tablet 2 tablet  2 tablet Oral Q4H PRN Norlene Campbell B, MD   2 tablet at 08/04/20 1512    prednisoLONE acetate (PRED FORTE) 1 % ophthalmic suspension 1 drop  1 drop Left Eye QID Norlene Campbell B, MD   1 drop at 08/04/20 0906    rosuvastatin (CRESTOR) tablet 40 mg  40 mg Oral Every Other Day Darryll Capers R, MD        sodium chloride (PF) 0.9 % injection 3 mL  3 mL Intravenous Q8H Norlene Campbell B, MD   3 mL at 08/05/20 0403     Allergies  Allergen Reactions    Iodine Shortness Of Breath    Penicillins Shortness Of Breath     Has patient had a PCN reaction causing immediate rash, facial/tongue/throat swelling, SOB or lightheadedness with hypotension: Yes  Has patient had a PCN reaction causing severe rash involving mucus membranes or skin necrosis: No  Has patient had a PCN reaction that required hospitalization: No  Has patient had a PCN reaction occurring within the last 10 years: Yes  If all of the above answers are "NO", then may proceed with Cephalosporin use.    Shellfish-Derived Products Anaphylaxis    Atorvastatin      Joint pain     Morphine      Itchy     Tetanus Immune Globulin      Arm swelling     Zocor [Simvastatin]      No response      Blood pressure 119/73, pulse 94, temperature 98.2 F (36.8 C), temperature source Oral, resp. rate 16, height 1.613 m (5' 3.5"), weight 77.5 kg (170 lb 14.4 oz), SpO2 94 %.    Subjective:  Symptoms:  Stable.  She reports malaise and weakness.  No anxiety.    Diet:  Adequate intake.  No nausea or vomiting.    Activity level: Impaired due to pain.    Pain:  She complains of pain that is mild.  She reports pain is improving.  Pain is well controlled.    Review of Systems   Constitutional: Positive for malaise/fatigue. Negative for fever.   HENT: Negative for congestion,  ear discharge, nosebleeds, sinus pain and tinnitus.    Eyes: Negative for blurred vision and photophobia.   Respiratory: Negative for sputum production.    Cardiovascular: Negative for palpitations and claudication.   Gastrointestinal: Negative for blood in stool, constipation, nausea and vomiting.   Genitourinary: Negative for hematuria and urgency.   Musculoskeletal: Positive for joint pain.   Neurological: Positive for weakness. Negative for dizziness and headaches.   Endo/Heme/Allergies: Does not bruise/bleed easily.   Psychiatric/Behavioral: Negative for depression, memory loss and suicidal ideas. The patient does not have insomnia.          Objective:  General Appearance:  Comfortable, in no acute distress, well-appearing and not in pain.    Vital signs: (most recent): Blood pressure 119/73, pulse 94, temperature 98.2 F (36.8 C), temperature source Oral, resp. rate 16, height 1.613 m (5' 3.5"), weight 77.5 kg (170 lb 14.4 oz), SpO2 94 %.  No fever.    Output: Producing urine and producing stool.    HEENT: Normal HEENT exam.    Lungs:  Normal effort and normal respiratory rate.  Breath sounds clear to auscultation.  There are decreased breath sounds.  No wheezes or rhonchi.    Heart: Normal rate.  Regular rhythm.  S1 normal.  No murmur, gallop or friction rub.   Chest: Symmetric chest wall expansion. No chest wall tenderness.    Abdomen: Abdomen is soft.  Bowel sounds are normal.   There is no abdominal tenderness.  There is no rebound tenderness.  There is no guarding.   There is no mass. There is no splenomegaly. There is no hepatomegaly.   Extremities: Decreased range of motion.  There is no dependent edema or local swelling.  (Left knee wrapped up distal neurovascular intact)  Pulses: Distal pulses are intact.    Neurological: Patient is alert and oriented to person, place and time.  Patient has normal reflexes, normal muscle tone and normal  coordination.    Pupils:  Pupils are equal, round, and reactive  to light.    Skin:  Warm, dry and pale.  No rash, ecchymosis, cyanosis or ulceration.   MDM  Number of Diagnoses or Management Options  Acute pain of left knee: established, improving  Post-op pain: established, improving  Primary osteoarthritis of left knee: established, improving     Amount and/or Complexity of Data Reviewed  Clinical lab tests: reviewed  Tests in the radiology section of CPT: reviewed  Tests in the medicine section of CPT: reviewed  Discussion of test results with the performing providers: yes  Decide to obtain previous medical records or to obtain history from someone other than the patient: yes  Obtain history from someone other than the patient: yes  Review and summarize past medical records: yes  Discuss the patient with other providers: yes  Independent visualization of images, tracings, or specimens: yes    Risk of Complications, Morbidity, and/or Mortality  Presenting problems: high  Diagnostic procedures: high  Management options: high    Critical Care  Total time providing critical care: 30-74 minutes    Patient Progress  Patient progress: stable    Assessment:  (Severe osteoarthritis of the left knee  Left total knee arthroplasty Dr. Norlene Campbell 08/03/2020  Hypertension  Hyperlipidemia  Fuchs dystrophy of the left cornea  Pseudophakia left eye  Sciatica lumbar spine  Insomnia  Osteoporosis).     Plan:   (Postoperative day 1  Patient walked with PT yesterday however today she has extreme pain  She is seen by nurse anesthesia  She says Dilaudid tramadol and Norco does not help  Percocet does help which she will increase it to 2 tablets  Patient had bowel movement  Await PT OT    Addendum  Patient still has a lot of pain  We will keep overnight for ambulation fall prevention gait training neuro reeducation  Discussed with Dr. Laveda Norman    08/05/2020  Patient is feeling much better  [facility] with PT OT  Pain is decreasing  Wound is dressed up no bleeding  Discharge home  Follow-up with Dr. Laveda Norman and  PCP).     This note was generated by the Epic EMR system/ Dragon speech recognition and may contain inherent errors or omissions not intended by the user. Grammatical errors, random word insertions, deletions, pronoun errors and incomplete sentences are occasional consequences of this technology due to software limitations. Not all errors are caught or corrected. If there are questions or concerns about the content of this note or information contained within the body of this dictation they should be addressed directly with the author for clarification  Lenox Ponds, MD  08/05/2020

## 2020-08-06 NOTE — Anesthesia Postprocedure Evaluation (Signed)
Anesthesia Post Evaluation    Patient: Wendy [name]    Procedure(s):  ARTHROPLASTY, TOTAL, KNEE MAKOPLASTY    Anesthesia type: spinal    Last Vitals:   Vitals Value Taken Time   BP 165/74 08/03/20 1650   Temp 36.3 C (97.3 F) 08/03/20 1547   Pulse 52 08/03/20 1650   Resp 18 08/03/20 1650   SpO2 100 % 08/03/20 1650                 Anesthesia Post Evaluation:     Patient Evaluated: PACU    Level of Consciousness: awake and alert  Pain Score: 5  Pain Management: adequate    Airway Patency: patent    Anesthetic complications: No      PONV Status: none    Cardiovascular status: acceptable  Respiratory status: acceptable  Hydration status: acceptable        Signed by: Burman Riis, MD, 08/06/2020 11:50 AM

## 2020-08-09 ENCOUNTER — Encounter (INDEPENDENT_AMBULATORY_CARE_PROVIDER_SITE_OTHER): Payer: Self-pay | Admitting: Internal Medicine

## 2020-08-10 ENCOUNTER — Encounter (INDEPENDENT_AMBULATORY_CARE_PROVIDER_SITE_OTHER): Payer: Self-pay

## 2020-08-10 ENCOUNTER — Telehealth (INDEPENDENT_AMBULATORY_CARE_PROVIDER_SITE_OTHER): Payer: Self-pay | Admitting: Internal Medicine

## 2020-08-10 ENCOUNTER — Other Ambulatory Visit (INDEPENDENT_AMBULATORY_CARE_PROVIDER_SITE_OTHER): Payer: Self-pay | Admitting: Internal Medicine

## 2020-08-10 ENCOUNTER — Encounter (INDEPENDENT_AMBULATORY_CARE_PROVIDER_SITE_OTHER): Payer: Self-pay | Admitting: Internal Medicine

## 2020-08-10 DIAGNOSIS — R300 Dysuria: Secondary | ICD-10-CM

## 2020-08-10 MED ORDER — NITROFURANTOIN MONOHYD MACRO 100 MG PO CAPS
100.0000 mg | ORAL_CAPSULE | Freq: Two times a day (BID) | ORAL | 0 refills | Status: AC
Start: 2020-08-10 — End: 2020-08-15

## 2020-08-10 NOTE — Telephone Encounter (Signed)
Spoke to patient reports frequent urine , burning and mild  pain along with back pain yesterday, pt concern of UTI I advised appointment with provider to evaluate symptoms in office but declines states she can't do tomorrow but she will keep upcoming appt wiith Dr Drema Pry 08/12/2020

## 2020-08-10 NOTE — Telephone Encounter (Signed)
Patient called to speak with a nurse regarding her possible UTI. She said she's felling uncomfortable and needs your advice. She will have an appointment on the April 21, Thursday but she's feeling uncomfortable now and she knows she's having UTI. Please call her at [phone].

## 2020-08-11 NOTE — Addendum Note (Signed)
Addendum  created 08/11/20 1117 by Janan Ridge, CRNA    Intraprocedure Meds edited

## 2020-08-11 NOTE — UM Notes (Signed)
PATIENT NAME: Wendy [name],Wendy [name]   PATIENT DOB: 12/24/1955, 65 y.o./64 y.o., female   DISCHARGE DATE NOTIFICATION   PATIENT DISCHARGED ON: 08/05/2020 TO Home or Self Care     Is obs stay # [id number]   approved? Do you need additional info?    Please provide authorization or call with questions.      4/13 Place for Observation [facility] [[id number]]        4/12  Operative Procedure:   Procedure(s):  ARTHROPLASTY, TOTAL, KNEE MAKOPLASTY       * Primary osteoarthritis of one knee, left [M17.12]    4/13:    BP: 120/76   Pulse: 82   Resp: 16   Temp: 98.1 F (36.7 C)   SpO2: 97%            Pt/ot  Patient walked with PT yesterday however today she has extreme pain  She is seen by nurse anesthesia  She says Dilaudid tramadol and Norco does not help  Percocet does help which she will increase it to 2 tablets  Patient had bowel movement  Await PT OT    Addendum  Patient still has a lot of pain  We will keep overnight for ambulation fall prevention gait training neuro reeducation    ceFAZolin (ANCEF) 2 g in dextrose 5 % 50 mL IVPB  Dose: 2 g  Freq: Every 8 hours Route: IV    0.9% NaCl infusion  Rate: 85 mL/hr Freq: Continuous Route: IV    Medication Dose Route Frequency    aspirin EC  325 mg Oral BID    docusate sodium  100 mg Oral BID    ferrous sulfate 324 mg-ascorbic acid 500 mg combo dose   Oral Daily    hydroCHLOROthiazide  12.5 mg Oral Daily    lisinopril  20 mg Oral Daily    prednisoLONE acetate  1 drop Left Eye QID    rosuvastatin  40 mg Oral Every Other Day    sodium chloride (PF)  3 mL Intravenous Q8H              Wynell Balloon RN CM  [facility]  [phone]    Wynell Balloon, RN, BSN, ACM  Clinical Case Manager   [facility]   [address]   [place], [place] [address]  [phone]  Direct Line  [phone] CM Department  [phone] VM for Insurer  [phone] Fax   [phone] Hospital Main Number    NPI: [id number]               Tax ID: [id number]    This clinical/utilization  review is compiled from the documentation of the care team providers.

## 2020-08-12 ENCOUNTER — Inpatient Hospital Stay (INDEPENDENT_AMBULATORY_CARE_PROVIDER_SITE_OTHER): Payer: No Typology Code available for payment source | Admitting: Internal Medicine

## 2020-08-18 ENCOUNTER — Ambulatory Visit (INDEPENDENT_AMBULATORY_CARE_PROVIDER_SITE_OTHER): Payer: No Typology Code available for payment source | Admitting: Internal Medicine

## 2020-08-18 ENCOUNTER — Telehealth (INDEPENDENT_AMBULATORY_CARE_PROVIDER_SITE_OTHER): Payer: Self-pay | Admitting: Internal Medicine

## 2020-08-18 ENCOUNTER — Encounter (INDEPENDENT_AMBULATORY_CARE_PROVIDER_SITE_OTHER): Payer: Self-pay | Admitting: Internal Medicine

## 2020-08-18 VITALS — BP 128/70 | HR 82 | Temp 98.2°F | Resp 18 | Ht 63.0 in | Wt 171.0 lb

## 2020-08-18 DIAGNOSIS — I10 Essential (primary) hypertension: Secondary | ICD-10-CM

## 2020-08-18 DIAGNOSIS — E782 Mixed hyperlipidemia: Secondary | ICD-10-CM

## 2020-08-18 DIAGNOSIS — Z96652 Presence of left artificial knee joint: Secondary | ICD-10-CM

## 2020-08-18 MED ORDER — TRIAMTERENE-HCTZ 37.5-25 MG PO TABS
1.0000 | ORAL_TABLET | Freq: Every day | ORAL | 1 refills | Status: DC
Start: 2020-08-18 — End: 2020-08-19

## 2020-08-18 MED ORDER — ROSUVASTATIN CALCIUM 20 MG PO TABS
ORAL_TABLET | ORAL | 1 refills | Status: DC
Start: 2020-08-18 — End: 2021-07-06

## 2020-08-18 MED ORDER — TRAMADOL HCL 50 MG PO TABS
50.0000 mg | ORAL_TABLET | Freq: Two times a day (BID) | ORAL | 1 refills | Status: DC
Start: 2020-08-18 — End: 2020-12-13

## 2020-08-18 NOTE — Telephone Encounter (Addendum)
[facility] called to get clarification of direction from Dr. Konrad Dolores regarding her medications: 1. triamterene-hydrochlorothiazide (MAXZIDE-25) 37.5-25 MG per tablet  It says there take 1 tablet by mouth daily and Notes to Pharmacy: Half tablet daily    And secondly, regarding her medication 2. traMADol (ULTRAM) 50 MG tablet  Pharmacist wanted to know if she is a chronic pain patient because they have been filling up recently.    Please call the pharmacy for these issues:    [facility] [address] [facility], [place] - [address] [address] Phone:  [phone]   Fax:  [phone]

## 2020-08-18 NOTE — Progress Notes (Signed)
Chief Complaint   Patient presents with   . Hospital Follow-up     She is s/p Left total knee arthroplasty Dr. Norlene Campbell 08/03/2020 on ASA 325  BID per Ortho   She F/U last week and again May 11th 2022  She is running out of tramadol and needs refills  Doing well with PT  Low potassium at hospital  Advised to change HCTZ to [facility] half a pill daily and monitor BP    She wants to lower her rosuvastatin, feels it gives her white skin spots    Past Medical History:   Diagnosis Date   . Brain tumor (benign)    . Hypercholesteremia    . Hypertensive disorder       Past Surgical History:   Procedure Laterality Date   . ARTHROPLASTY, KNEE, TOTAL MAKOPLASTY Left 08/03/2020    Procedure: ARTHROPLASTY, TOTAL, KNEE MAKOPLASTY;  Surgeon: Abe People, MD;  Location: [facility];  Service: Orthopedics;  Laterality: Left;   . BRAIN SURGERY  2009   . BREAST BIOPSY Right 2008    right breast biopsy- benign   . BREAST BIOPSY Right 1970s    right breast tumor removed 40+ years ago - benign   . BREAST CYST EXCISION  1970s    right breast tumor removed 40+ years ago - benign   . CESAREAN SECTION     . CORNEAL TRANSPLANT  2013   . EXCHANGE, CORNEAL IMPLANT Left    . EYE SURGERY     . KNEE SURGERY Left 08/03/2020   . TUBAL LIGATION        (Not in a hospital admission)    Current/Home Medications    ACETAZOLAMIDE (DIAMOX) 125 MG TABLET    Take 125 mg by mouth 3 (three) times daily    ASPIRIN EC 325 MG TABLET    Take 1 tablet (325 mg total) by mouth 2 (two) times daily    CO-ENZYME Q-10 50 MG CAPSULE    Take 50 mg by mouth daily.    DOCUSATE SODIUM (COLACE) 100 MG CAPSULE    Take 1 capsule (100 mg total) by mouth 2 (two) times daily    ERGOCALCIFEROL (DRISDOL) 1.25 MG (50000 UT) CAPSULE    Take 1 capsule (50,000 Units total) by mouth once a week    FLUTICASONE (FLONASE) 50 MCG/ACT NASAL SPRAY    1 spray by Nasal route as needed for Rhinitis    IBUPROFEN (ADVIL) 600 MG TABLET    Take 1 tablet (600 mg total) by mouth every 6 (six) hours  as needed for Pain    LISINOPRIL (ZESTRIL) 20 MG TABLET    Take 1 tablet (20 mg total) by mouth daily    MULTIPLE VITAMIN (MULTIVITAMIN) TABLET    Take 1 tablet by mouth daily.    NORTRIPTYLINE (PAMELOR) 10 MG CAPSULE    Take 1 capsule (10 mg total) by mouth as needed (sleep)    PREDNISOLONE ACETATE (PRED FORTE) 1 % OPHTHALMIC SUSPENSION    Place 1 drop into the left eye 4 (four) times daily    TRIAMCINOLONE (KENALOG) 0.025 % OINTMENT    Apply topically 2 (two) times daily     Allergies   Allergen Reactions   . Iodine Shortness Of Breath   . Penicillins Shortness Of Breath     Has patient had a PCN reaction causing immediate rash, facial/tongue/throat swelling, SOB or lightheadedness with hypotension: Yes  Has patient had a PCN reaction causing severe rash  involving mucus membranes or skin necrosis: No  Has patient had a PCN reaction that required hospitalization: No  Has patient had a PCN reaction occurring within the last 10 years: Yes  If all of the above answers are "NO", then may proceed with Cephalosporin use.   Marland Kitchen Shellfish-Derived Products Anaphylaxis   . Atorvastatin      Joint pain    . Morphine      Itchy    . Tetanus Immune Globulin      Arm swelling    . Zocor [Simvastatin]      No response       Social History     Tobacco Use   . Smoking status: Never Smoker   . Smokeless tobacco: Never Used   Substance Use Topics   . Alcohol use: Yes     Alcohol/week: 1.0 - 2.0 standard drink     Types: 1 - 2 Shots of liquor per week      Family History   Problem Relation Age of Onset   . Diabetes Mother    . Hypertension Mother    . Diabetes Father    . Hypertension Father    . Breast cancer Neg Hx       Review of Systems   Constitutional: Negative for chills, fever and malaise/fatigue.   Cardiovascular: Negative for chest pain, palpitations and leg swelling.   Musculoskeletal: Positive for joint pain.        S/p Left TKR    Neurological: Negative for dizziness, focal weakness and headaches.       Vitals:    08/18/20  1513   BP: 128/70   Pulse: 82   Resp: 18   Temp: 98.2 F (36.8 C)   SpO2: 98%     Physical Examination: General appearance - alert, well appearing, and in no distress  Mental status - alert, oriented to person, place, and time  Chest - clear to auscultation, no wheezes, rales or rhonchi, symmetric air entry  Heart - normal rate, regular rhythm, normal S1, S2, no murmurs, rubs, clicks or gallops  Neurological - alert, oriented, normal speech, no focal findings or movement disorder noted  Musculoskeletal - left knee post op scar clean, no redness,   Left knee joint swollen Post op   Extremities - peripheral pulses normal, no pedal edema,      Assessment/Plan:      1. Mixed hyperlipidemia  - rosuvastatin (CRESTOR) 20 MG tablet; 1 tab qod  Dispense: 90 tablet; Refill: 1    2. Status post total left knee replacement    - traMADol (ULTRAM) 50 MG tablet; Take 1 tablet (50 mg total) by mouth every 12 (twelve) hours  Dispense: 30 tablet; Refill: 1    3. Essential hypertension    - triamterene-hydrochlorothiazide (MAXZIDE-25) 37.5-25 MG per tablet; Take 1 tablet by mouth daily  Dispense: 90 tablet; Refill: 1    Body mass index is 30.29 kg/m.      Dr Windell Moment MD  Internist  [facility] [place]  [address], [place],  [place] [id number]  [phone]  Fax-[phone]

## 2020-08-18 NOTE — Progress Notes (Signed)
Have you seen any specialists/other providers since your last visit with us?    Yes    Arm preference verified?   Yes    The patient is due for nothing at this time, HM is up-to-date.

## 2020-08-19 ENCOUNTER — Telehealth (INDEPENDENT_AMBULATORY_CARE_PROVIDER_SITE_OTHER): Payer: Self-pay | Admitting: Internal Medicine

## 2020-08-19 ENCOUNTER — Other Ambulatory Visit (INDEPENDENT_AMBULATORY_CARE_PROVIDER_SITE_OTHER): Payer: Self-pay | Admitting: Internal Medicine

## 2020-08-19 DIAGNOSIS — I10 Essential (primary) hypertension: Secondary | ICD-10-CM

## 2020-08-19 MED ORDER — TRIAMTERENE-HCTZ 37.5-25 MG PO TABS
1.0000 | ORAL_TABLET | Freq: Every day | ORAL | 1 refills | Status: DC
Start: 2020-08-19 — End: 2021-02-02

## 2020-08-19 NOTE — Telephone Encounter (Signed)
Prescription resent per PCP notes

## 2020-08-19 NOTE — Telephone Encounter (Signed)
[facility] called again for the patient's medications:    [facility] called to get clarification of direction from Dr. Konrad Dolores regarding her medications: 1. triamterene-hydrochlorothiazide (MAXZIDE-25) 37.5-25 MG per tablet  It says there take 1 tablet by mouth daily and Notes to Pharmacy: Half tablet daily - which is which?    And secondly, regarding her medication 2. traMADol (ULTRAM) 50 MG tablet  Pharmacist wanted to know if she is a chronic pain patient because they have been filling up recently.    Please call the pharmacy for these issues:    [facility] [address], [place] - [address] [address] Phone:  [phone]   Fax:  [phone]

## 2020-08-19 NOTE — Telephone Encounter (Signed)
Noted  

## 2020-08-23 ENCOUNTER — Telehealth (INDEPENDENT_AMBULATORY_CARE_PROVIDER_SITE_OTHER): Payer: Self-pay | Admitting: Internal Medicine

## 2020-08-23 NOTE — Telephone Encounter (Signed)
Patient called and would like to speak only with the nurse or provider in order to know if she needs to take the lisinopril (ZESTRIL) 20 MG tablet in addition with the new prescription triamterene-hydrochlorothiazide (MAXZIDE-25) 37.5-25 MG per tablet.  Please call her back at [phone].

## 2020-08-24 ENCOUNTER — Encounter (INDEPENDENT_AMBULATORY_CARE_PROVIDER_SITE_OTHER): Payer: Self-pay

## 2020-08-24 ENCOUNTER — Telehealth (INDEPENDENT_AMBULATORY_CARE_PROVIDER_SITE_OTHER): Payer: Self-pay | Admitting: Internal Medicine

## 2020-08-24 NOTE — Telephone Encounter (Signed)
Called pt . No answer lvm and my chart message sent.informing pt to take both BP medications.

## 2020-08-24 NOTE — Telephone Encounter (Signed)
She will take both meds for BP  PLEASE CALL AND NOTIFY PATIENT WITH DOCUMENTATION

## 2020-08-24 NOTE — Telephone Encounter (Signed)
Patient is returning a call from the nurse regarding her medication   Pt can be reached at [phone]

## 2020-08-30 ENCOUNTER — Telehealth (INDEPENDENT_AMBULATORY_CARE_PROVIDER_SITE_OTHER): Payer: Self-pay | Admitting: Internal Medicine

## 2020-08-30 NOTE — Telephone Encounter (Signed)
Pharmacy called to know for this patient the medicationtraMADol Wendy [name]) 50 MG tablet (Order [id number])    Is it because is she chronic.    Please call pharmacy [phone]

## 2020-08-30 NOTE — Telephone Encounter (Signed)
Prescription clarified with pharmacist.

## 2020-09-02 ENCOUNTER — Encounter (INDEPENDENT_AMBULATORY_CARE_PROVIDER_SITE_OTHER): Payer: Self-pay

## 2020-09-07 ENCOUNTER — Ambulatory Visit (INDEPENDENT_AMBULATORY_CARE_PROVIDER_SITE_OTHER): Payer: Medicare Other | Admitting: Physical Therapy

## 2020-09-07 ENCOUNTER — Other Ambulatory Visit: Payer: Self-pay

## 2020-09-07 ENCOUNTER — Encounter: Payer: Self-pay | Admitting: Physical Therapy

## 2020-09-07 DIAGNOSIS — M25662 Stiffness of left knee, not elsewhere classified: Secondary | ICD-10-CM

## 2020-09-07 DIAGNOSIS — R600 Localized edema: Secondary | ICD-10-CM

## 2020-09-07 DIAGNOSIS — R262 Difficulty in walking, not elsewhere classified: Secondary | ICD-10-CM

## 2020-09-07 DIAGNOSIS — M25562 Pain in left knee: Secondary | ICD-10-CM | POA: Diagnosis not present

## 2020-09-07 DIAGNOSIS — M6281 Muscle weakness (generalized): Secondary | ICD-10-CM | POA: Diagnosis not present

## 2020-09-07 NOTE — Therapy (Signed)
[facility] Physical Therapy [address] [place], [place], [address] Phone: [phone]   Fax:  [phone]  Physical Therapy Evaluation  Patient Details  Name: Annette Bentley MRN: [id number] Date of Birth: [birth year] Referring Provider (PT): Abe People, MD   Encounter Date: 09/07/2020   PT End of Session - 09/07/20 1156    Visit Number 1    Number of Visits 12    Date for PT Re-Evaluation 10/19/20    PT Start Time 1016    PT Stop Time 1100    PT Time Calculation (min) 44 min    Activity Tolerance Patient tolerated treatment well    Behavior During Therapy [facility] for tasks assessed/performed           Past Medical History:  Diagnosis Date  . High cholesterol   . Hypertension     Past Surgical History:  Procedure Laterality Date  . BRAIN SURGERY    . BREAST EXCISIONAL BIOPSY Right    benign  . EYE SURGERY      There were no vitals filed for this visit.    Subjective Assessment - 09/07/20 1024    Subjective She had Lt TKA 08/03/20 and had HHPT after. 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 15    How long can you walk comfortably? 15    Patient Stated Goals reduce pain and get more bend, powerwalk and ride stationary bike    Currently in Pain? Yes    Pain Score 7     Pain Location Knee    Pain Orientation Left;Anterior    Pain Descriptors / Indicators Aching;Sore    Pain Type Surgical pain    Pain Radiating Towards denies N/T    Pain Onset More than a month ago    Pain Frequency Intermittent    Aggravating Factors  prolonged walking or standing, bending    Pain Relieving Factors NSAIDS    Multiple Pain Sites No              [facility] PT Assessment - 09/07/20 0001      Assessment    Medical Diagnosis Lt TKA    Referring Provider (PT) Abe People, MD    Onset Date/Surgical Date 08/03/20    Next MD Visit 10/13/20?    Prior Therapy HHPT      Precautions   Precautions None      Restrictions   Other Position/Activity Restrictions WBAT      Balance Screen   Has the patient fallen in the past 6 months No    Has the patient had a decrease in activity level because of a fear of falling?  No    Is the patient reluctant to leave their home because of a fear of falling?  No      Prior Function   Level of Independence Independent    Vocation Retired    [employer] retired from IT consultant    Leisure walk, church      Cognition   Overall Cognitive Status Within Functional Limits for tasks assessed      Observation/Other Assessments   Observations moderate swelling, no other signs of infection, incision site well healed    Focus on Therapeutic Outcomes (FOTO)  40% functional intake      ROM / Strength   AROM / PROM / Strength AROM;PROM;Strength      AROM   AROM Assessment Site Knee    Right/Left Knee Left    Left Knee Extension 2    Left Knee Flexion 95      PROM   PROM Assessment Site Knee    Right/Left Knee Left    Left Knee Extension 0    Left Knee Flexion 98      Strength   Overall Strength Comments tested in sitting    Strength Assessment Site Hip;Knee    Right/Left Hip Left    Left Hip Flexion 4/5    Left Hip ABduction 4+/5    Right/Left Knee Left    Left Knee Flexion 4/5    Left Knee Extension 4/5      Transfers   Transfers Independent with all Transfers      Ambulation/Gait   Ambulation/Gait Yes    Ambulation/Gait Assistance 6: Modified independent (Device/Increase time)    Ambulation/Gait Assistance Details increased time due to decreased gait velocity    Gait Comments decreased hip/knee flexion on Lt with decreased weight shift to left                      Objective measurements completed on  examination: See above findings.       [facility] Adult PT Treatment/Exercise - 09/07/20 0001      Exercises   Exercises Knee/Hip      Knee/Hip Exercises: Stretches   Knee: Self-Stretch Limitations seated heelslide with self O.P 10 sec X10      Knee/Hip Exercises: Aerobic   Recumbent Bike 8 min no resistance, seat 5                  PT Education - 09/07/20 1156    Education Details HEP,POC    Person(s) Educated Patient    Methods Explanation;Demonstration;Verbal cues;Handout    Comprehension Verbalized understanding;Returned demonstration;Need further instruction            PT Short Term Goals - 09/07/20 1201      PT SHORT TERM GOAL #1   Title Pt will be I and compliant with HEP    Time 4    Period Weeks    Status New    Target Date 10/05/20             PT Long Term Goals - 09/07/20 1209      PT LONG TERM GOAL #1   Title Pt will improve FOTO functional score to at least 64%    Baseline 40    Time 6    Period Weeks    Status New    Target Date 10/19/20      PT LONG TERM GOAL #2   Title Pt will improve Lt knee strength 5/5 MMT to improve function    Time 6    Period Weeks    Status New      PT LONG TERM GOAL #3   Title Pt will improve Lt knee ROM 0-110 degrees to improve function.    Time 6    Period Weeks    Status New      PT LONG TERM GOAL #4   Title Pt will report less than 3/10 pain with ADL's and community ambulation with stairs    Time 6    Period Weeks    Status New    Target Date  10/19/20                  Plan - 09/07/20 1157    Clinical Impression Statement Pt presents with Lt knee pain, stiffness, weakness, and edema S/P Lt TKA on 08/03/20. She is no longer using AD for ambulation but does still have difficulty with ambulation. She will benefit from skilled PT to addresss her functional deficits. She lives in [place] and plans to attend PT here in [place] for about 4 weeks before she moves back.    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    Examination-Activity Limitations Bend;Carry;Lift;Stand;Stairs;Squat;Locomotion Level    Examination-Participation Restrictions Cleaning;Community Activity;Driving;Laundry;Shop    Stability/Clinical Decision Making Stable/Uncomplicated    Clinical Decision Making Low    Rehab Potential Good    PT Frequency 2x / week   2-3   PT Duration 6 weeks   4-6   PT Treatment/Interventions ADLs/Self Care Home Management;Cryotherapy;Electrical Stimulation;Iontophoresis 4mg /ml Dexamethasone;Moist Heat;Ultrasound;Gait training;Stair training;Therapeutic activities;Therapeutic exercise;Balance training;Neuromuscular re-education;Manual techniques;Passive range of motion;Dry needling;Joint Manipulations;Vasopneumatic Device;Taping    PT Next Visit Plan knee flexion ROM focus and progres gait, strength as able    PT Home Exercise Plan Access Code: [email or web address]    Consulted and Agree with Plan of Care Patient           Patient will benefit from skilled therapeutic intervention in order to improve the following deficits and impairments:  Decreased activity tolerance,Decreased endurance,Decreased range of motion,Decreased strength,Increased edema,Difficulty walking,Pain  Visit Diagnosis: Acute pain of left knee  Stiffness of left knee, not elsewhere classified  Muscle weakness (generalized)  Difficulty in walking, not elsewhere classified  Localized edema     Problem List There are no problems to display for this patient.   09/07/2020, 12:12 PM  [facility] Physical Therapy [address] [place], [place], [place] Phone: [phone]   Fax:  [phone]  Name: Annette Bentley MRN: [name] Date of Birth: May 16, 1955

## 2020-09-07 NOTE — Patient Instructions (Signed)
Access Code: [email or web address] URL: [email or web address] Date: 09/07/2020 Prepared by: Ivery Quale  Exercises Supine Heel Slide with Strap - 2 x daily - 6 x weekly - 1-2 sets - 10 reps - 5 hold Seated Knee Flexion Stretch - 2 x daily - 6 x weekly - 1-2 sets - 10 reps - 5 sec hold Seated Hamstring Stretch - 2 x daily - 6 x weekly - 1 sets - 3 reps - 30 hold Sit to Stand Without Arm Support - 2 x daily - 6 x weekly - 1-2 sets - 10 reps Step Up - 2 x daily - 6 x weekly - 1-2 sets - 10 reps

## 2020-09-09 ENCOUNTER — Ambulatory Visit (INDEPENDENT_AMBULATORY_CARE_PROVIDER_SITE_OTHER): Payer: Medicare Other | Admitting: Rehabilitative and Restorative Service Providers"

## 2020-09-09 ENCOUNTER — Encounter: Payer: Self-pay | Admitting: Rehabilitative and Restorative Service Providers"

## 2020-09-09 ENCOUNTER — Other Ambulatory Visit: Payer: Self-pay

## 2020-09-09 DIAGNOSIS — M25662 Stiffness of left knee, not elsewhere classified: Secondary | ICD-10-CM | POA: Diagnosis not present

## 2020-09-09 DIAGNOSIS — M6281 Muscle weakness (generalized): Secondary | ICD-10-CM | POA: Diagnosis not present

## 2020-09-09 DIAGNOSIS — R600 Localized edema: Secondary | ICD-10-CM

## 2020-09-09 DIAGNOSIS — R262 Difficulty in walking, not elsewhere classified: Secondary | ICD-10-CM

## 2020-09-09 DIAGNOSIS — M25562 Pain in left knee: Secondary | ICD-10-CM

## 2020-09-09 NOTE — Therapy (Signed)
[facility] Physical Therapy [address] [place], [place], [address] Phone: [phone]   Fax:  [phone]  Physical Therapy Treatment  Patient Details  Name: Annette Bentley MRN: [id number] Date of Birth: 1955/06/21 Referring Provider (PT): Abe People, MD   Encounter Date: 09/09/2020   PT End of Session - 09/09/20 0925    Visit Number 2    Number of Visits 12    Date for PT Re-Evaluation 10/19/20    Progress Note Due on Visit 10    PT Start Time 0926    PT Stop Time 1015    PT Time Calculation (min) 49 min    Activity Tolerance Patient tolerated treatment well    Behavior During Therapy [facility] for tasks assessed/performed           Past Medical History:  Diagnosis Date  . High cholesterol   . Hypertension     Past Surgical History:  Procedure Laterality Date  . BRAIN SURGERY    . BREAST EXCISIONAL BIOPSY Right    benign  . EYE SURGERY      There were no vitals filed for this visit.   Subjective Assessment - 09/09/20 0931    Subjective Pt. indicated feeling no specific pain today upon arrival.  Pt. stated some ache at times. Pt. stated some concerns and nervous feeling about stairs, particularily going down stairs.  Has a bike at home she tried to use.    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 15    How long can you walk comfortably? 15    Patient Stated Goals reduce pain and get more bend, powerwalk and ride stationary bike    Currently in Pain? No/denies    Pain Score 0-No pain    Pain Onset More than a month ago                             [facility] Adult PT Treatment/Exercise - 09/09/20 0001      Neuro Re-ed    Neuro Re-ed Details  retro step Lt leg posterior 20x, rhythmic stabilizations Lt knee extension/flexion in 45 deg flexion, 80 degree flexion c mild to moderate resistance.      Knee/Hip  Exercises: Stretches   Passive Hamstring Stretch 30 seconds;3 reps;Left   supine c strap   Gastroc Stretch 30 seconds;3 reps;Both   incline board   Other Knee/Hip Stretches supine TKE stretch education and performance during vaso      Knee/Hip Exercises: Aerobic   Recumbent Bike seat 5 6 mins full revolutions      Knee/Hip Exercises: Seated   Long Arc Quad Left;2 sets;10 reps   2 sec hold in extension and flexion c opposite contralateral leg movement   Other Seated Knee/Hip Exercises seated passive overpressure c Rt leg for Lt knee flexion 10 sec x 5      Knee/Hip Exercises: Supine   Other Supine Knee/Hip Exercises heel slide active c SLR combo x 10 on Lt leg      Modalities   Modalities Vasopneumatic      Vasopneumatic   Number Minutes Vasopneumatic  10 minutes   in elevation c heel prop TKE stretch   Vasopnuematic Location  Knee   Lt   Vasopneumatic Pressure Medium    Vasopneumatic Temperature  34      Manual Therapy  Manual therapy comments seated Lt knee distraction, IR, flexion mobilizations c movement for Lt knee flexion gains                    PT Short Term Goals - 09/07/20 1201      PT SHORT TERM GOAL #1   Title Pt will be I and compliant with HEP    Time 4    Period Weeks    Status New    Target Date 10/05/20             PT Long Term Goals - 09/07/20 1209      PT LONG TERM GOAL #1   Title Pt will improve FOTO functional score to at least 64%    Baseline 40    Time 6    Period Weeks    Status New    Target Date 10/19/20      PT LONG TERM GOAL #2   Title Pt will improve Lt knee strength 5/5 MMT to improve function    Time 6    Period Weeks    Status New      PT LONG TERM GOAL #3   Title Pt will improve Lt knee ROM 0-110 degrees to improve function.    Time 6    Period Weeks    Status New      PT LONG TERM GOAL #4   Title Pt will report less than 3/10 pain with ADL's and community ambulation with stairs    Time 6    Period Weeks     Status New    Target Date 10/19/20                 Plan - 09/09/20 0956    Clinical Impression Statement Pt. presented c noted difficulty in straight leg raise ability and quad activation but improved c practice and cues.  Continued skilled PT services indicated to improve strength, mobility of Lt knee c progression to WB strengthening and movement coordination intervention to facilitate improved functional mobility.    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    Examination-Activity Limitations Bend;Carry;Lift;Stand;Stairs;Squat;Locomotion Level    Examination-Participation Restrictions Cleaning;Community Activity;Driving;Laundry;Shop    Stability/Clinical Decision Making Stable/Uncomplicated    Rehab Potential Good    PT Frequency 2x / week   2-3   PT Duration 6 weeks   4-6   PT Treatment/Interventions ADLs/Self Care Home Management;Cryotherapy;Electrical Stimulation;Iontophoresis 4mg /ml Dexamethasone;Moist Heat;Ultrasound;Gait training;Stair training;Therapeutic activities;Therapeutic exercise;Balance training;Neuromuscular re-education;Manual techniques;Passive range of motion;Dry needling;Joint Manipulations;Vasopneumatic Device;Taping    PT Next Visit Plan Manual intervention for flexion gains/symptom relief.  Continue progress of quad activation in slr, LAQ movement c transitioning to step up , squats/transfers    PT Home Exercise Plan Access Code: [email or web address]  (cues for addition of supine heel slide active c SLR combo)    Consulted and Agree with Plan of Care Patient           Patient will benefit from skilled therapeutic intervention in order to improve the following deficits and impairments:  Decreased activity tolerance,Decreased endurance,Decreased range of motion,Decreased strength,Increased edema,Difficulty walking,Pain  Visit Diagnosis: Acute pain of left knee  Stiffness of left knee, not  elsewhere classified  Muscle weakness (generalized)  Difficulty in walking, not elsewhere classified  Localized edema     Problem List There are no problems to display for this patient.  , PT, DPT, OCS, ATC 09/09/20  10:08 AM  [facility] Physical Therapy [address] [place], [place], [address] Phone: [phone]   Fax:  [phone]  Name: Annette Bentley MRN: [id number] Date of Birth: 03-23-1956

## 2020-09-13 ENCOUNTER — Ambulatory Visit (INDEPENDENT_AMBULATORY_CARE_PROVIDER_SITE_OTHER): Payer: Medicare Other | Admitting: Rehabilitative and Restorative Service Providers"

## 2020-09-13 ENCOUNTER — Other Ambulatory Visit: Payer: Self-pay

## 2020-09-13 ENCOUNTER — Encounter: Payer: Self-pay | Admitting: Rehabilitative and Restorative Service Providers"

## 2020-09-13 DIAGNOSIS — M25662 Stiffness of left knee, not elsewhere classified: Secondary | ICD-10-CM

## 2020-09-13 DIAGNOSIS — R600 Localized edema: Secondary | ICD-10-CM

## 2020-09-13 DIAGNOSIS — M25562 Pain in left knee: Secondary | ICD-10-CM

## 2020-09-13 DIAGNOSIS — M6281 Muscle weakness (generalized): Secondary | ICD-10-CM | POA: Diagnosis not present

## 2020-09-13 DIAGNOSIS — R262 Difficulty in walking, not elsewhere classified: Secondary | ICD-10-CM

## 2020-09-13 NOTE — Therapy (Signed)
[facility] Physical Therapy [address] [place], [place], [address] Phone: [phone]   Fax:  [phone]  Physical Therapy Treatment  Patient Details  Name: Annette Bentley MRN: [id number] Date of Birth: December 07, 1955 Referring Provider (PT): Abe People, MD   Encounter Date: 09/13/2020   PT End of Session - 09/13/20 1355    Visit Number 3    Number of Visits 12    Date for PT Re-Evaluation 10/19/20    Progress Note Due on Visit 10    PT Start Time 1349    PT Stop Time 1439    PT Time Calculation (min) 50 min    Activity Tolerance Patient tolerated treatment well    Behavior During Therapy [facility] for tasks assessed/performed           Past Medical History:  Diagnosis Date  . High cholesterol   . Hypertension     Past Surgical History:  Procedure Laterality Date  . BRAIN SURGERY    . BREAST EXCISIONAL BIOPSY Right    benign  . EYE SURGERY      There were no vitals filed for this visit.   Subjective Assessment - 09/13/20 1353    Subjective Pt. stated 5/10 pain with walking.  Pt. stated today seemed to be more painful.  Noted some pain c twisting movement of knee.    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 15    Patient Stated Goals reduce pain and get more bend, powerwalk and ride stationary bike    Currently in Pain? Yes    Pain Score 5     Pain Location Knee    Pain Orientation Left    Pain Descriptors / Indicators Aching;Sore;Sharp    Pain Type Surgical pain    Pain Onset More than a month ago    Pain Frequency Intermittent    Aggravating Factors  twisting movement in standing, walking    Pain Relieving Factors resting some helps              [facility] PT Assessment - 09/13/20 0001      Assessment   Medical Diagnosis Lt TKA    Referring Provider (PT) Abe People, MD    Onset Date/Surgical Date 08/03/20       AROM   Left Knee Flexion 100   in supine heel slide                        [facility] Adult PT Treatment/Exercise - 09/13/20 0001      Knee/Hip Exercises: Stretches   Other Knee/Hip Stretches TKE stretch supine in elevation c vaso      Knee/Hip Exercises: Aerobic   Recumbent Bike seat 5 8 mins full revolutions      Knee/Hip Exercises: Seated   Long Arc Quad Left;2 sets;10 reps    Long Arc Quad Weight 3 lbs.    Other Seated Knee/Hip Exercises seated isometric 5 sec hold ext then flexion 12 x each way      Knee/Hip Exercises: Supine   Bridges 10 reps;2 sets;Both   2 second hold   Other Supine Knee/Hip Exercises heel slide active c SLR combo 2x 10 on Lt leg      Vasopneumatic   Number Minutes Vasopneumatic  10 minutes   Lt leg in elevated heel prop   Vasopnuematic Location  Knee  Vasopneumatic Pressure Medium    Vasopneumatic Temperature  34      Manual Therapy   Manual therapy comments seated Lt knee distraction, IR, flexion mobilizations c movement for Lt knee flexion gains                    PT Short Term Goals - 09/13/20 1355      PT SHORT TERM GOAL #1   Title Pt will be I and compliant with HEP    Time 4    Period Weeks    Status On-going    Target Date 10/05/20             PT Long Term Goals - 09/07/20 1209      PT LONG TERM GOAL #1   Title Pt will improve FOTO functional score to at least 64%    Baseline 40    Time 6    Period Weeks    Status New    Target Date 10/19/20      PT LONG TERM GOAL #2   Title Pt will improve Lt knee strength 5/5 MMT to improve function    Time 6    Period Weeks    Status New      PT LONG TERM GOAL #3   Title Pt will improve Lt knee ROM 0-110 degrees to improve function.    Time 6    Period Weeks    Status New      PT LONG TERM GOAL #4   Title Pt will report less than 3/10 pain with ADL's and community ambulation with stairs    Time 6    Period Weeks    Status New    Target Date 10/19/20                  Plan - 09/13/20 1420    Clinical Impression Statement Pt. reported symptoms in WB today limited activity to more seated and supine to allow improved mobility, strengthening to quad to help provide symptom reduction and overall improved mobility.  Plan to resume static balance, WB loading activity in future visits as tolerated. Able to perform independent ambulation c reduced gait speed today.    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    Examination-Activity Limitations Bend;Carry;Lift;Stand;Stairs;Squat;Locomotion Level    Examination-Participation Restrictions Cleaning;Community Activity;Driving;Laundry;Shop    Stability/Clinical Decision Making Stable/Uncomplicated    Rehab Potential Good    PT Frequency 2x / week   2-3   PT Duration 6 weeks   4-6   PT Treatment/Interventions ADLs/Self Care Home Management;Cryotherapy;Electrical Stimulation;Iontophoresis 4mg /ml Dexamethasone;Moist Heat;Ultrasound;Gait training;Stair training;Therapeutic activities;Therapeutic exercise;Balance training;Neuromuscular re-education;Manual techniques;Passive range of motion;Dry needling;Joint Manipulations;Vasopneumatic Device;Taping    PT Next Visit Plan Manual intervention for flexion gains/symptom relief.  Continue progress of quad activation in slr, LAQ movement c transitioning to step up , squats/transfers as tolerated    PT Home Exercise Plan Access Code: [email or web address]    Consulted and Agree with Plan of Care Patient           Patient will benefit from skilled therapeutic intervention in order to improve the following deficits and impairments:  Decreased activity tolerance,Decreased endurance,Decreased range of motion,Decreased strength,Increased edema,Difficulty walking,Pain  Visit Diagnosis: Acute pain of left knee  Stiffness of left knee, not elsewhere classified  Muscle weakness (generalized)  Difficulty in  walking, not elsewhere classified  Localized edema     Problem List There are no problems  to display for this patient.   Chyrel Masson, PT, DPT, OCS, ATC 09/13/20  2:26 PM     [facility] Physical Therapy [address] [place], [place], [address] Phone: [phone]   Fax:  [phone]  Name: Annette Bentley MRN: [id number] Date of Birth: 1955/09/28

## 2020-09-14 ENCOUNTER — Encounter: Payer: Self-pay | Admitting: Physical Therapy

## 2020-09-14 ENCOUNTER — Ambulatory Visit (INDEPENDENT_AMBULATORY_CARE_PROVIDER_SITE_OTHER): Payer: Medicare Other | Admitting: Physical Therapy

## 2020-09-14 DIAGNOSIS — R600 Localized edema: Secondary | ICD-10-CM

## 2020-09-14 DIAGNOSIS — R262 Difficulty in walking, not elsewhere classified: Secondary | ICD-10-CM | POA: Diagnosis not present

## 2020-09-14 DIAGNOSIS — M6281 Muscle weakness (generalized): Secondary | ICD-10-CM | POA: Diagnosis not present

## 2020-09-14 DIAGNOSIS — M25562 Pain in left knee: Secondary | ICD-10-CM | POA: Diagnosis not present

## 2020-09-14 DIAGNOSIS — M25662 Stiffness of left knee, not elsewhere classified: Secondary | ICD-10-CM | POA: Diagnosis not present

## 2020-09-14 NOTE — Therapy (Signed)
[facility] Physical Therapy [address] [place], [place], [address] Phone: [phone]   Fax:  [phone]  Physical Therapy Treatment  Patient Details  Name: Annette Bentley MRN: [id number] Date of Birth: 05-Apr-1956 Referring Provider (PT): Abe People, MD   Encounter Date: 09/14/2020   PT End of Session - 09/14/20 1441    Visit Number 4    Number of Visits 12    Date for PT Re-Evaluation 10/19/20    Progress Note Due on Visit 10    PT Start Time 1432    PT Stop Time 1515    PT Time Calculation (min) 43 min    Activity Tolerance Patient tolerated treatment well    Behavior During Therapy [facility] for tasks assessed/performed           Past Medical History:  Diagnosis Date  . High cholesterol   . Hypertension     Past Surgical History:  Procedure Laterality Date  . BRAIN SURGERY    . BREAST EXCISIONAL BIOPSY Right    benign  . EYE SURGERY      There were no vitals filed for this visit.   Subjective Assessment - 09/14/20 1434    Subjective Pt states she is doing well today. She does not have much pain and is feels walking is better. She still feels pain with twisting/pivoting barefoot.    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 15    Patient Stated Goals reduce pain and get more bend, powerwalk and ride stationary bike    Currently in Pain? No/denies    Pain Score 0-No pain    Pain Onset More than a month ago                             [facility] Adult PT Treatment/Exercise - 09/14/20 0001               Knee/Hip Exercises: Aerobic   Recumbent Bike seat 5 8 mins full revolutions      Knee/Hip Exercises: Standing   Other Standing Knee Exercises stair step flexion stretch 10s 10x    Other Standing Knee Exercises HR/TR 20x   quad set with TR                Other Seated Knee/Hip Exercises seated  isometric 5 sec hold ext then flexion 15 x each way      Knee/Hip Exercises: Supine   Bridges 10 reps;2 sets;Both   2 second hold   Other Supine Knee/Hip Exercises heel slide active c SLR combo 2x 10 on Lt leg      Knee/Hip Exercises: Prone   Other Prone Exercises prone TKE 3s 2x10                          Manual Therapy   Manual therapy comments R knee flexion mob PA tibia grade III, L TKE mob with tibial ER grade III                  PT Education - 09/14/20 1819    Education Details anatomy, exercise progression, knee ROM demands for ADL, muscle firing, HEP, gait mechanics    Person(s) Educated Patient    Methods Explanation;Demonstration;Tactile cues;Verbal cues    Comprehension Verbal cues required;Tactile cues required;Returned demonstration;Verbalized understanding  PT Short Term Goals - 09/13/20 1355      PT SHORT TERM GOAL #1   Title Pt will be I and compliant with HEP    Time 4    Period Weeks    Status On-going    Target Date 10/05/20             PT Long Term Goals - 09/07/20 1209      PT LONG TERM GOAL #1   Title Pt will improve FOTO functional score to at least 64%    Baseline 40    Time 6    Period Weeks    Status New    Target Date 10/19/20      PT LONG TERM GOAL #2   Title Pt will improve Lt knee strength 5/5 MMT to improve function    Time 6    Period Weeks    Status New      PT LONG TERM GOAL #3   Title Pt will improve Lt knee ROM 0-110 degrees to improve function.    Time 6    Period Weeks    Status New      PT LONG TERM GOAL #4   Title Pt will report less than 3/10 pain with ADL's and community ambulation with stairs    Time 6    Period Weeks    Status New    Target Date 10/19/20                 Plan - 09/14/20 1513    Clinical Impression Statement Pt presented with lack of TKE during heel strike during gait and was improved with screw home mechanism mob. Pt was able to tolerate CKC flexion  stretching with improved ROM following. Pt demonstrated good tolerance to increased intensity of stretching exercise as well as good quadriceps contraction in standing with VC and TC. Plan to trial SLR at next session to assess for extensor lag. Pt would benefit from continued skilled therapy in order to reach goals and maximize functional L LE strength and ROM for full return to PLOF.    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    Examination-Activity Limitations Bend;Carry;Lift;Stand;Stairs;Squat;Locomotion Level    Examination-Participation Restrictions Cleaning;Community Activity;Driving;Laundry;Shop    Stability/Clinical Decision Making Stable/Uncomplicated    Rehab Potential Good    PT Frequency 2x / week   2-3   PT Duration 6 weeks   4-6   PT Treatment/Interventions ADLs/Self Care Home Management;Cryotherapy;Electrical Stimulation;Iontophoresis 4mg /ml Dexamethasone;Moist Heat;Ultrasound;Gait training;Stair training;Therapeutic activities;Therapeutic exercise;Balance training;Neuromuscular re-education;Manual techniques;Passive range of motion;Dry needling;Joint Manipulations;Vasopneumatic Device;Taping    PT Next Visit Plan Manual intervention for flexion gains/symptom relief.  SLR, LAQ movement c transitioning to step up , squats/transfers as tolerated, side stepping    PT Home Exercise Plan Access Code: [email or web address]    Consulted and Agree with Plan of Care Patient           Patient will benefit from skilled therapeutic intervention in order to improve the following deficits and impairments:  Decreased activity tolerance,Decreased endurance,Decreased range of motion,Decreased strength,Increased edema,Difficulty walking,Pain  Visit Diagnosis: Acute pain of left knee  Stiffness of left knee, not elsewhere classified  Muscle weakness (generalized)  Difficulty in walking, not elsewhere classified  Localized  edema     Problem List There are no problems to display for this patient.   PT, DPT 09/14/20 6:31 PM   [facility] Physical Therapy [phone]  [address] [place], [place], [address] Phone: [phone]   Fax:  [phone]  Name: Annette Bentley MRN: [id number] Date of Birth: 1955-09-03

## 2020-09-21 ENCOUNTER — Ambulatory Visit (INDEPENDENT_AMBULATORY_CARE_PROVIDER_SITE_OTHER): Payer: Medicare Other | Admitting: Physical Therapy

## 2020-09-21 ENCOUNTER — Encounter: Payer: Self-pay | Admitting: Physical Therapy

## 2020-09-21 ENCOUNTER — Other Ambulatory Visit: Payer: Self-pay

## 2020-09-21 DIAGNOSIS — M6281 Muscle weakness (generalized): Secondary | ICD-10-CM | POA: Diagnosis not present

## 2020-09-21 DIAGNOSIS — M25662 Stiffness of left knee, not elsewhere classified: Secondary | ICD-10-CM

## 2020-09-21 DIAGNOSIS — R262 Difficulty in walking, not elsewhere classified: Secondary | ICD-10-CM

## 2020-09-21 DIAGNOSIS — M25562 Pain in left knee: Secondary | ICD-10-CM

## 2020-09-21 NOTE — Therapy (Signed)
[facility] Physical Therapy [address] [place], [place], [address] Phone: [phone]   Fax:  [phone]  Physical Therapy Treatment  Patient Details  Name: MONITA SWIER MRN: [id number] Date of Birth: 1955-11-06 Referring Provider (PT): Abe People, MD   Encounter Date: 09/21/2020   PT End of Session - 09/21/20 1157    Visit Number 5    Number of Visits 12    Date for PT Re-Evaluation 10/19/20    Progress Note Due on Visit 10    PT Start Time 1147    PT Stop Time 1230    PT Time Calculation (min) 43 min    Activity Tolerance Patient tolerated treatment well    Behavior During Therapy [facility] for tasks assessed/performed           Past Medical History:  Diagnosis Date  . High cholesterol   . Hypertension     Past Surgical History:  Procedure Laterality Date  . BRAIN SURGERY    . BREAST EXCISIONAL BIOPSY Right    benign  . EYE SURGERY      There were no vitals filed for this visit.   Subjective Assessment - 09/21/20 1152    Subjective Pt states that she had to travel over the weekend and was on her feet a lot as well as sitting still in a car. She feels she may have overdone it. She is sore today. She feels it more laterally today as opposed to medially.    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 15    Patient Stated Goals reduce pain and get more bend, powerwalk and ride stationary bike    Currently in Pain? Yes    Pain Score 7     Pain Location Knee    Pain Descriptors / Indicators Aching;Sore    Pain Onset More than a month ago    Aggravating Factors  bending, moving                             [facility] Adult PT Treatment/Exercise - 09/21/20 0001      Knee/Hip Exercises: Stretches   Other Knee/Hip Stretches prone quad stretch 30s 3x      Knee/Hip Exercises: Aerobic   Recumbent Bike seat 5 9  mins full revolutions   improved knee flexion     Knee/Hip Exercises: Standing   Other Standing Knee Exercises stair step flexion stretch 10s 10x    Other Standing Knee Exercises --      Knee/Hip Exercises: Seated   Long Arc Quad 2 sets;10 reps    Long Arc Quad Weight 5 lbs.    Other Seated Knee/Hip Exercises seated isometric 5 sec hold ext then flexion 15 x each way                   Knee/Hip Exercises: Prone   Other Prone Exercises prone TKE 3s 15x;      Manual Therapy   Manual therapy comments R knee flexion mob PA tibia grade III, Mob w movement knee flexion with overpressure                  PT Education - 09/21/20 1156    Education Details exercise progression, knee ROM demands for ADL, muscle firing, HEP, gait mechanics, load management, pacing    Person(s) Educated Patient  Methods Explanation;Demonstration;Tactile cues;Verbal cues;Handout    Comprehension Verbalized understanding;Returned demonstration;Verbal cues required;Tactile cues required            PT Short Term Goals - 09/13/20 1355      PT SHORT TERM GOAL #1   Title Pt will be I and compliant with HEP    Time 4    Period Weeks    Status On-going    Target Date 10/05/20             PT Long Term Goals - 09/07/20 1209      PT LONG TERM GOAL #1   Title Pt will improve FOTO functional score to at least 64%    Baseline 40    Time 6    Period Weeks    Status New    Target Date 10/19/20      PT LONG TERM GOAL #2   Title Pt will improve Lt knee strength 5/5 MMT to improve function    Time 6    Period Weeks    Status New      PT LONG TERM GOAL #3   Title Pt will improve Lt knee ROM 0-110 degrees to improve function.    Time 6    Period Weeks    Status New      PT LONG TERM GOAL #4   Title Pt will report less than 3/10 pain with ADL's and community ambulation with stairs    Time 6    Period Weeks    Status New    Target Date 10/19/20                 Plan - 09/21/20  1225    Clinical Impression Statement Pt presented with increased sweling and stiffness at today's session. Pt had improved flexion ROM following exercise and MT and was able to reach >100 deg of knee flexion with CKC stair step stretching. She demonstrated extensor lag with SLR and required VC and TC for quadriceps contraction. Pt gave verbal understanding to edema management. Plan to continue with CKC stretching and quadriceps endurance/strength at next session. Pt would benefit from continued skilled therapy in order to reach goals and maximize functional L LE strength and ROM for full return to PLOF.    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    Examination-Activity Limitations Bend;Carry;Lift;Stand;Stairs;Squat;Locomotion Level    Examination-Participation Restrictions Cleaning;Community Activity;Driving;Laundry;Shop    Stability/Clinical Decision Making Stable/Uncomplicated    Rehab Potential Good    PT Frequency 2x / week   2-3   PT Duration 6 weeks   4-6   PT Treatment/Interventions ADLs/Self Care Home Management;Cryotherapy;Electrical Stimulation;Iontophoresis 4mg /ml Dexamethasone;Moist Heat;Ultrasound;Gait training;Stair training;Therapeutic activities;Therapeutic exercise;Balance training;Neuromuscular re-education;Manual techniques;Passive range of motion;Dry needling;Joint Manipulations;Vasopneumatic Device;Taping    PT Next Visit Plan joint mob flexion and ext, banded TKE, review stair lunge flexion stretch,  side stepping, STS to lower height or leg press    PT Home Exercise Plan Access Code: [email or web address]    Consulted and Agree with Plan of Care Patient           Patient will benefit from skilled therapeutic intervention in order to improve the following deficits and impairments:  Decreased activity tolerance,Decreased endurance,Decreased range of motion,Decreased strength,Increased edema,Difficulty  walking,Pain  Visit Diagnosis: Acute pain of left knee  Stiffness of left knee, not elsewhere classified  Muscle weakness (generalized)  Difficulty in walking, not elsewhere classified     Problem List  There are no problems to display for this patient.  Zebedee Iba PT, DPT 09/21/20 12:41 PM   [facility] Physical Therapy [address] [place], [place], [address] Phone: [phone]   Fax:  [phone]  Name: JENNENE DOWNIE MRN: [id number] Date of Birth: 1955/11/01

## 2020-09-21 NOTE — Patient Instructions (Signed)
Access Code: [email or web address] URL: [email or web address] Date: 09/21/2020 Prepared by: Zebedee Iba  Exercises Supine Heel Slide with Strap - 2 x daily - 6 x weekly - 1-2 sets - 10 reps - 5 hold Seated Knee Flexion Stretch - 2 x daily - 6 x weekly - 1-2 sets - 10 reps - 5 sec hold Seated Hamstring Stretch - 2 x daily - 6 x weekly - 1 sets - 3 reps - 30 hold Sit to Stand Without Arm Support - 2 x daily - 6 x weekly - 1-2 sets - 10 reps Step Up - 2 x daily - 6 x weekly - 1-2 sets - 10 reps Prone Quadriceps Stretch with Strap - 2 x daily - 6 x weekly - 1 sets - 3 reps - 30 hold

## 2020-09-24 ENCOUNTER — Ambulatory Visit (INDEPENDENT_AMBULATORY_CARE_PROVIDER_SITE_OTHER): Payer: Medicare Other | Admitting: Rehabilitative and Restorative Service Providers"

## 2020-09-24 ENCOUNTER — Other Ambulatory Visit: Payer: Self-pay

## 2020-09-24 ENCOUNTER — Encounter: Payer: Self-pay | Admitting: Rehabilitative and Restorative Service Providers"

## 2020-09-24 DIAGNOSIS — M25562 Pain in left knee: Secondary | ICD-10-CM

## 2020-09-24 DIAGNOSIS — M25662 Stiffness of left knee, not elsewhere classified: Secondary | ICD-10-CM

## 2020-09-24 DIAGNOSIS — M6281 Muscle weakness (generalized): Secondary | ICD-10-CM | POA: Diagnosis not present

## 2020-09-24 DIAGNOSIS — R600 Localized edema: Secondary | ICD-10-CM

## 2020-09-24 DIAGNOSIS — R262 Difficulty in walking, not elsewhere classified: Secondary | ICD-10-CM

## 2020-09-24 NOTE — Therapy (Addendum)
[facility] Physical Therapy [address] [place], [place], [address] Phone: [phone]   Fax:  [phone]  Physical Therapy Treatment/Discharge Summary  Patient Details  Name: Annette Bentley MRN: [id number] Date of Birth: [birth year] Referring Provider (PT): Amado Nash, MD   Encounter Date: 09/24/2020   PT End of Session - 09/24/20 1107     Visit Number 6    Number of Visits 12    Date for PT Re-Evaluation 10/19/20    PT Start Time 1025    PT Stop Time 1105    PT Time Calculation (min) 40 min    Activity Tolerance Patient tolerated treatment well;No increased pain    Behavior During Therapy WFL for tasks assessed/performed             Past Medical History:  Diagnosis Date   High cholesterol    Hypertension     Past Surgical History:  Procedure Laterality Date   BRAIN SURGERY     BREAST EXCISIONAL BIOPSY Right    benign   EYE SURGERY      There were no vitals filed for this visit.   Subjective Assessment - 09/24/20 1034     Subjective I am doing good today. Pt 10 min late waiting for movers.    Currently in Pain? No/denies    Pain Score 0-No pain                               [facility] Adult PT Treatment/Exercise - 09/24/20 0001       Knee/Hip Exercises: Aerobic   Nustep level 5 LEs only x 5 min      Knee/Hip Exercises: Standing   Other Standing Knee Exercises squats x 20 at parallel bars, 4 inch L step up x 20 at parallel bar, 6 inch front step ups in parallel bars x 20; 4 inch R step downs forward x 20 followed by 20 on 6 inch step    Other Standing Knee Exercises L SLS x 30 sec at countertop; L SL mini-squat small ROM x 10 with PT monitoring for technique at countertop.      Knee/Hip Exercises: Supine   Other Supine Knee/Hip Exercises bridge x 20, L hamstring dig x 15 with 2-3 sec hold, SAQ x 15 with maximal PT verbal and tactile cues for technique and proper muscle facilitation                       PT Short Term Goals - 09/24/20 1205       PT SHORT TERM GOAL #1   Title Pt will be I and compliant with HEP    Status On-going               PT Long Term Goals - 09/07/20 1209       PT LONG TERM GOAL #1   Title Pt will improve FOTO functional score to at least 64%    Baseline 40    Time 6    Period Weeks    Status New    Target Date 10/19/20      PT LONG TERM GOAL #2   Title Pt will improve Lt knee strength 5/5 MMT to improve function    Time 6    Period Weeks    Status New      PT LONG TERM GOAL #3   Title Pt will improve Lt knee ROM 0-110 degrees  to improve function.    Time 6    Period Weeks    Status New      PT LONG TERM GOAL #4   Title Pt will report less than 3/10 pain with ADL's and community ambulation with stairs    Time 6    Period Weeks    Status New    Target Date 10/19/20                   Plan - 09/24/20 1107     Clinical Impression Statement PT advised pt not to do any new exercises at home and that she may be sore tomorrow and to ice as needed tonight and tomorrow and do HEP. Reviewed standing lunge stair stretch with pt able to perform correctly. All exercises performed to work on control of L knee without substitution. Pt would benefit from further L knee strengthening and control. Very hesitant on steps with weakness concerns.    PT Treatment/Interventions ADLs/Self Care Home Management;Cryotherapy;Electrical Stimulation;Iontophoresis 40m/ml Dexamethasone;Moist Heat;Ultrasound;Gait training;Stair training;Therapeutic activities;Therapeutic exercise;Balance training;Neuromuscular re-education;Manual techniques;Passive range of motion;Dry needling;Joint Manipulations;Vasopneumatic Device;Taping    PT Next Visit Plan continue to work on L knee strengthening exercises and ROM    Consulted and Agree with Plan of Care Patient             Patient will benefit from skilled therapeutic intervention in order to  improve the following deficits and impairments:  Decreased activity tolerance,Decreased endurance,Decreased range of motion,Decreased strength,Increased edema,Difficulty walking,Pain  Visit Diagnosis: Acute pain of left knee  Stiffness of left knee, not elsewhere classified  Muscle weakness (generalized)  Difficulty in walking, not elsewhere classified  Localized edema     Problem List There are no problems to display for this patient.   NAmerica Brown, PT, DPT 09/24/2020, 12:05 PM  [facility] [address] [address]Phone: [phone]  Fax:  [phone] Name: Annette MCNALLMRN: [id number] of Birth: [birth year]    PHYSICAL THERAPY DISCHARGE SUMMARY  Visits from Start of Care: 6  Current functional level related to goals / functional outcomes: See above   Remaining deficits: See above   Education / Equipment: HEP   Patient agrees to discharge. Patient goals were not met. Patient is being discharged due to the patient's request.  [place]  SLaureen Abrahams PT, DPT 12/08/20 11:44 AM   [facility] [address] [address]Phone: [phone]  Fax:  [phone]

## 2020-09-27 ENCOUNTER — Telehealth (INDEPENDENT_AMBULATORY_CARE_PROVIDER_SITE_OTHER): Payer: Self-pay | Admitting: Internal Medicine

## 2020-09-27 NOTE — Telephone Encounter (Signed)
FYI

## 2020-09-27 NOTE — Telephone Encounter (Signed)
Ren from [facility] will send a Plan of Care for the patient to be signed and completed by Dr. Konrad Dolores.

## 2020-09-28 ENCOUNTER — Encounter: Payer: Medicare Other | Admitting: Physical Therapy

## 2020-09-28 NOTE — Telephone Encounter (Signed)
Where is the Form??

## 2020-09-28 NOTE — Telephone Encounter (Signed)
Just placed on your Urgent folder

## 2020-09-28 NOTE — Telephone Encounter (Signed)
Its addressed to Dr Laveda Norman ??

## 2020-09-30 ENCOUNTER — Encounter: Payer: Medicare Other | Admitting: Rehabilitative and Restorative Service Providers"

## 2020-10-06 ENCOUNTER — Inpatient Hospital Stay: Payer: No Typology Code available for payment source | Attending: Orthopaedic Surgery

## 2020-10-06 DIAGNOSIS — M25562 Pain in left knee: Secondary | ICD-10-CM | POA: Insufficient documentation

## 2020-10-06 DIAGNOSIS — M25662 Stiffness of left knee, not elsewhere classified: Secondary | ICD-10-CM | POA: Insufficient documentation

## 2020-10-06 NOTE — Progress Notes (Signed)
Name:Wendy [name] Age: 65 y.o.   Date of Service: 10/06/2020  Referring Physician: Abe People, MD   Date of Injury: 08/03/2020  Date Care Plan Established/Reviewed: 10/06/2020  Date Treatment Started: 10/06/2020  End of Certification Date: 01/03/2021  Sessions in Plan of Care: 22  Surgery Date: 08/03/2020  MD Follow-up: No data was found  Medbridge Code: No data was found    Visit Count: 1   Diagnosis:   1. Left knee pain, unspecified chronicity    2. Knee stiffness, left        Subjective     History of Present Illness   Functional Limitations (PLOF): Lifting heavy objects is painful and difficult; Stairs: descending>ascending knee pain with step to gait;  Walking without shoes on is painful; Floor transfers-very slow, but not painful;       Precautions: No data was found  Allergies: Iodine, Penicillins, Shellfish-derived products, Atorvastatin, Morphine, Tetanus immune globulin, and Zocor [simvastatin]    Past Medical History:   Diagnosis Date   . Brain tumor (benign)    . Hypercholesteremia    . Hypertensive disorder                         ---    Flowsheet Row ---   Total Time    Timed Minutes 8 minutes   Untimed Minutes 32 minutes   Total Time 40 minutes        Assessment   Tahliyah is a 66 y.o. female presenting with L knee pain and stiffness s/p TKA 08/03/20 who requires Physical Therapy for the following:  Impairments: L knee swelling and pain, abnl mm tone/ soft tissue restriction/ decreased scar mobility, limited ROM of L knee flexion and AROM of knee extension, decreased strength of L knee flex> hip extension > knee ext weakness, abnormal gait, decreased balance, poor posture and body mechanics, limited understanding of appropriate HEP     Pain located: global knee    Clinical presentation:   Stable, as expected for diagnosis   Barriers to therapy: Pt long history of prior knee pain may limit her ability to progress through PT.   Functional Limitations (PLOF): Lifting heavy objects is painful and difficult;  Stairs: descending>ascending knee pain with step to gait;  Walking without shoes on is painful; Floor transfers-very slow, but not painful;   Prognosis: good  Plan   Visits per week: 2  Number of Sessions: 22  Direct One on One  45409: Therapeutic Exercise: To Develop Strength and Endurance, ROM and Flexibility  [id number]: Gait Training  81191: Neuromuscular Reeducation  97140: Manual Therapy techniques (mobilization, manipulation, manual traction) (STM to affected muscles, joint mobilization grades 1-4 to affected joints PRN)  97530: Therapeutic Activities: Dynamic activities to improve functional performance  Dry Needling  47829: Aquatic Therapy  Supervised Modalities  97010: Thermal modalities: hot/cold packs  56213: Electrical stimulation  97016: Vasopneumatic devices  DN- Will require prescription and patient must sign release form      Goals    Goal 1: Pt will improve L knee flexion to 120 deg in order to improve stair negotiation with 1 HR, with reciprocal gait, and with p! 2/10 or less.    Sessions: 22      Goal 2: Pt will improve knee flexion strength by 0.5 MMT in order to improve ability to negotiate stairs.    Sessions: 22      Goal 3: Pt will improve hip extension strength by  0.5 MMT in order to improve ability to perform floor trransfers with improved speed and with p! 2/10 or less in order to improve safety and care for grandchildren.   Sessions: 22      Goal 4: Patient will demonstrate independence in prescribed HEP with proper form, sets and reps for safe discharge to an independent program.         Sessions: 22          Goal 5: Pt will improve B SLS B to 10s in order to improve safety with ambulation.   Sessions: 22                         Dale Durham, DPT

## 2020-10-06 NOTE — PT/OT Therapy Note (Addendum)
Name: Wendy [name] Age: 65 y.o.   Date of Service: 10/06/2020  Referring Physician: Abe People, MD   Date of Injury: 08/03/2020  Date Care Plan Established/Reviewed: 10/06/2020  Date Treatment Started: 10/06/2020  End of Certification Date: 01/03/2021  Sessions in Plan of Care: 22  Surgery Date: 08/03/2020  MD Follow-up: No data was found  Medbridge Code: No data was found    Visit Count: 1   Diagnosis:   1. Left knee pain, unspecified chronicity    2. 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 She was in the hospital for 2 days after surgery without complication. She had home health PT for 4 weeks. Then went to the OP PT for about 3 1/2 weeks in [place]. Just moved back to [place]  Brought in a number of exercises from prior PT (inclulding knee flexion stretches, step ups, STS without UE assist). She continues to have L knee pain and weakness with certain activities listed below. Stopped taking narcotics about 2 weeks after because she wasn't feeling well. Taking tylenol extra strength. Takes ibuprofen occasionally. Has some increased swelling after moving-where she did do a lot of lifting and stairs. Bike in PT was hard-has a stationary bike at home-difficulty with full revolutions-notices this if swollen. Is icing sometimes, not elevating very often. Has 4 grandchildren. 97, 56, and 46 year old-the 65 year old is kind of living with her and the other children come a couple of times. Ok to wear shoes.   Pain location: L knee global pain.   Aggs: Walk without shoes on, lifting heavy objects  Eases: Ice, ibuprofen  N&T: None  Functional Limitations (PLOF):  See POC Lifting heavy objects is painful and difficult; Stairs:  descending>ascending knee pain with step to gait;  Walking without shoes on is painful; Floor transfers-very slow, but not painful;   24 hour pattern: Morning: stiff, takes a while to move ; Daytime: symptoms improve throughout the day;  Night: first month very painful, now not as much  Other Treatment/Prior Therapy: PT for 3 1/2 weeks in [place].   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  SLS L: Eyes Open (EO): 4s     Girth/Edema: L LE swollen at ankle and knee   Initial R Initial L   Mid Patellar 42 cm 46 cm   6" Above patella 55.7 cm 54.6 cm   6" below patella 36.2 cm 38.6 cm   (blank fields were intentionally left blank)    Integumentary:  No redness or open wound  Palpation: Hypomobile all directions on L     Range of Motion: (degrees)  Initial R A/PROM Knee Initial L A/PROM   129 Flexion 97 p!   0 Extension Lacking 3/ 0 p!   (blank fields were intentionally left blank)    Hip AROM:NT  Ankle AROM: NT    Flexibility:  Hamstrings WFL on L   Quadriceps Restricted Left   Gastroc Restricted on L     Initial   Uninvolved LE Strength  MMT /5 Initial R  Taken in Supine/prone    Hip Flexion 5    Glute max NT p! c knee flexion   3+ Hip Extension 3+    Hip Abduction 4    Hip Adduction     Quadriceps 4    Hamstrings 3- p!!    Ankle Dorsiflexion     Ankle Plantarflexion    (blank fields were intentionally left blank)    Special tests IE L, R   SLR  Extensor lag, In tact         Sensation to Light touch: In tact    Treatment Initial Visit:  Evaluation   Patient Education on POC and prognosis  Therapeutic exercise (To improve or develop Flexibility/ROM, Strength and endurance) with instruction in HEP and provided patient written and illustrated handout Yes, reviewed pt current exercises, provided new ones also. Reviewed with pt S&S of DVT  Manual (To improve joint mobility, soft tissue mobility, and reduce trigger points): NA  NMR (For activation and/or inhibition of target muscle,- for movement, balance, coordination, kinesthetic sense, posture, and/ or proprioception for sitting and/ or standing activities): NA  Therapeutic Activity (For improvement in the completion of ADL's and functional activities): NA  Modalities: None  For Next Visit Add Assess MMT of R LE, STM to L HS, quadriceps,      Exercise Flow Sheet    Exercise Specifics Next             RC Rocking              SAQ               Bridge               LAR                 Side step               Leg press                 Step up   Blue                Heel raise                 Tandem stance                 Hurdles                                                 (Initials = supervised exercise by clinician)    Home Exercise Program:  Access Code: [email or web address]  URL: [email or web address]  Date: 10/06/2020  Prepared by: Beatris Ship    Exercises  Standing Knee Flexion AROM with Chair Support - 2 x daily - 7 x weekly - 1 sets - 10 reps  Step  Up - 2 x daily - 7 x weekly - 1 sets - 10 reps  Tandem Stance with Support - 2 x daily - 7 x weekly - 1 sets - 2 reps - 30s hold                     ---    Flowsheet Row ---   Total Time    Timed Minutes 8 minutes   Untimed Minutes 32 minutes   Total Time 40 minutes           Goals    Goal 1: Pt will improve L knee flexion to 120 deg in order to improve stair negotiation with 1 HR, with reciprocal gait, and with p! 2/10 or less.    Sessions: 22      Goal 2:  Pt will improve knee flexion strength by 0.5 MMT in order to improve ability to negotiate stairs.    Sessions: 22      Goal 3: Pt will improve hip extension strength by 0.5 MMT in order to improve ability to perform floor trransfers with improved speed and with p! 2/10 or less in order to improve safety and care for grandchildren.   Sessions: 22      Goal 4: Patient will demonstrate independence in prescribed HEP with proper form, sets and reps for safe discharge to an independent program.         Sessions: 22          Goal 5: Pt will improve B SLS B to 10s in order to improve safety with ambulation.   Sessions: 22                         Dale Durham, DPT

## 2020-10-07 ENCOUNTER — Inpatient Hospital Stay: Payer: No Typology Code available for payment source | Attending: Orthopaedic Surgery

## 2020-10-07 DIAGNOSIS — M25562 Pain in left knee: Secondary | ICD-10-CM | POA: Insufficient documentation

## 2020-10-07 DIAGNOSIS — M25662 Stiffness of left knee, not elsewhere classified: Secondary | ICD-10-CM | POA: Insufficient documentation

## 2020-10-07 NOTE — PT/OT Therapy Note (Signed)
Name: Wendy [name] Age: 65 y.o.   Date of Service: 10/07/2020  Referring Physician: Abe People, MD   Date of Injury: 08/03/2020  Date Care Plan Established/Reviewed: 10/06/2020  Date Treatment Started: 10/06/2020  End of Certification Date: 01/03/2021  Sessions in Plan of Care: 22  Surgery Date: 08/03/2020  MD Follow-up: No data was found  Medbridge Code: No data was found    Visit Count: 2   Diagnosis:   1. Left knee pain, unspecified chronicity    2. Knee stiffness, left             Precautions: No data was found  Allergies: Iodine, Penicillins, Shellfish-derived products, Atorvastatin, Morphine, Tetanus immune globulin, and Zocor [simvastatin]  Primary complaint/Involved Side: L TKA    SUBJECTIVE:  Felt ok after yesterday, feeling pretty good today. Iced yesterday but did not elevate. Took ibuprofen which she feels helped the discomfort. Did bike at home and was able to warm up a bit to full revolutions.     Functional Limitations (PLOF):  See POC Lifting heavy objects is painful and difficult; Stairs: descending>ascending knee pain with step to gait;  Walking without shoes on is painful; Floor transfers-very slow, but not painful;     OBJECTIVE:     Initial Evaluation Reference and/or Current Measurements(as dated):     Outcome Measure:  OUTCOMES  IE      FOTO 45     Pain         Balance:  SLS R: Eyes Open (EO): 4s.  SLS L: Eyes Open (EO): 4s        Girth/Edema: L LE swollen at ankle and knee    Initial R Initial L   Mid Patellar 42 cm 46 cm   6" Above patella 55.7 cm 54.6 cm   6" below patella 36.2 cm 38.6 cm   (blank fields were intentionally left blank)     Range of Motion: (degrees)  Initial R A/PROM Knee Initial L A/PROM 10/07/20 L A/PROM   129 Flexion 97 p!    0 Extension Lacking 3/ 0 p!    (blank fields were intentionally left blank)        Initial  Uninvolved LE Strength  MMT /5 Initial R  Taken in Supine/prone    5 (10/07/20) Hip Flexion 5     Glute max NT p! c knee flexion   3+ Hip Extension 3+     Hip  Abduction 4     Hip Adduction      4+ Quadriceps 4    4 Hamstrings 3- p!!     Ankle Dorsiflexion       Ankle Plantarflexion     (blank fields were intentionally left blank)     Special tests IE L, R   SLR Extensor lag, In tact              TREATMENT:     Exercise Flow Sheet     Exercise Specifics 10/07/20                       RC Rocking Full revolutions further back Lv 1 5' SS                        SAQ 3#  12x SS NMR                       Bridge    2x10  SS                       LAR R Pball   15x SS                          Side step    NV                       Leg press      NV                        Step up    Blue 10x each                            Heel raise  c ball at ankles 15x SS                            Tandem stance  30s B SS                            Hurdles    NV                                                                                     (Initials = supervised exercise by clinician)     Home Exercise Program:  Access Code: [email or web address]  URL: [email or web address]  Date: 10/06/2020  Prepared by: Beatris Ship     Exercises  Standing Knee Flexion AROM with Chair Support - 2 x daily - 7 x weekly - 1 sets - 10 reps  Step Up - 2 x daily - 7 x weekly - 1 sets - 10 reps  Tandem Stance with Support - 2 x daily - 7 x weekly - 1 sets - 2 reps - 30s hold    THERAPEUTIC EXERCISE (To improve or develop Flexibility/ROM, Strength and endurance)   Warm-up-  Subjective obtained to assist with planning today's visit  Modifications/Patient Education: Progressed exercises and HEP per exercise flow sheet Cuing (Verbal, Visual and Tactile cues)  - See below  -Assessed MMT of R LE    MANUAL THERAPY (To improve joint mobility, soft tissue mobility, and reduce trigger points)-Items denoted "MT" in flowsheet below  -Supine: STM to L HS, and quad,  scar tissue massage and education     NEUROMUSCULAR RE-ED (For activation and/or inhibition of target muscle,- for movement, balance, coordination, kinesthetic sense,  posture, and/ or proprioception for sitting and/ or standing activities-Items denoted "NMR" in flowsheet below  -   See above    THERAPEUTIC ACTIVITY (For improvement in the completion of ADL's and functional activities.  Dynamic activities to improve functional performance, direct (one-on-one) with the patient  - Discussed elevation and potentially obtaining compression stockings.     MODALITIES      ASSESSMENT (response to treatment):   Wendy [name] with fair response to manual. Challenged with sustained quad activation. Discussed compression and elevation to decrease swelling. Tolerated  session well, but had tendency to circumduct L LE with L step up. Cued to limit.     PLAN:  Patient requires continued skilled care to improve strength and ROM of L LE to return to activity.     For Next Visit Add Continue improving L LE strength, ROM, and B LE balance  Pt to have R vascular procedures for veins in July 2022                        Goals      Goal 1: Pt will improve L knee flexion to 120 deg in order to improve stair negotiation with 1 HR, with reciprocal gait, and with p! 2/10 or less.    Sessions: 22      Goal 2: Pt will improve knee flexion strength by 0.5 MMT in order to improve ability to negotiate stairs.    Sessions: 22      Goal 3: Pt will improve hip extension strength by 0.5 MMT in order to improve ability to perform floor trransfers with improved speed and with p! 2/10 or less in order to improve safety and care for grandchildren.   Sessions: 22      Goal 4: Patient will demonstrate independence in prescribed HEP with proper form, sets and reps for safe discharge to an independent program.         Sessions: 22          Goal 5: Pt will improve B SLS B to 10s in order to improve safety with ambulation.   Sessions: 22                           Dale Durham, DPT

## 2020-10-12 ENCOUNTER — Inpatient Hospital Stay: Payer: No Typology Code available for payment source | Attending: Orthopaedic Surgery

## 2020-10-12 DIAGNOSIS — M25562 Pain in left knee: Secondary | ICD-10-CM | POA: Insufficient documentation

## 2020-10-12 DIAGNOSIS — M25662 Stiffness of left knee, not elsewhere classified: Secondary | ICD-10-CM | POA: Insufficient documentation

## 2020-10-12 NOTE — PT/OT Therapy Note (Signed)
Name: Wendy [name] Age: 65 y.o.   Date of Service: 10/12/2020  Referring Physician: Abe People, MD   Date of Injury: 08/03/2020  Date Care Plan Established/Reviewed: 10/06/2020  Date Treatment Started: 10/06/2020  End of Certification Date: 01/03/2021  Sessions in Plan of Care: 22  Surgery Date: 08/03/2020  MD Follow-up: No data was found  Medbridge Code: No data was found    Visit Count: 3   Diagnosis:   1. Left knee pain, unspecified chronicity    2. Knee stiffness, left        Subjective     Social Support/Occupation                   Precautions: No data was found  Allergies: Iodine, Penicillins, Shellfish-derived products, Atorvastatin, Morphine, Tetanus immune globulin, and Zocor [simvastatin]  Primary complaint/Involved Side: L TKA    SUBJECTIVE:  Knee is feeling better. Has been doing her HEP. P! C initial revolutions on bike, but able to make full ones.     Functional Limitations (PLOF):  See POC Lifting heavy objects is painful and difficult; Stairs: descending>ascending knee pain with step to gait;  Walking without shoes on is painful; Floor transfers-very slow, but not painful;     OBJECTIVE:     Initial Evaluation Reference and/or Current Measurements(as dated):     Outcome Measure:  OUTCOMES  IE      FOTO 45     Pain         Balance:  SLS R: Eyes Open (EO): 4s.  SLS L: Eyes Open (EO): 4s        Girth/Edema: L LE swollen at ankle and knee    Initial R Initial L   Mid Patellar 42 cm 46 cm   6" Above patella 55.7 cm 54.6 cm   6" below patella 36.2 cm 38.6 cm   (blank fields were intentionally left blank)     Range of Motion: (degrees)  Initial R A/PROM Knee Initial L A/PROM 10/12/20 L A/PROM   129 Flexion 97 p! 95/100 >>post tx 100/110   0 Extension Lacking 3/ 0 p! Lacking 3/0   (blank fields were intentionally left blank)        Initial  Uninvolved LE Strength  MMT /5 Initial R  Taken in Supine/prone    5 (10/07/20) Hip Flexion 5     Glute max NT p! c knee flexion   3+ Hip Extension 3+     Hip Abduction 4      Hip Adduction      4+ Quadriceps 4    4 Hamstrings 3- p!!     Ankle Dorsiflexion       Ankle Plantarflexion     (blank fields were intentionally left blank)     Special tests IE L, R   SLR Extensor lag, In tact              TREATMENT:     Exercise Flow Sheet     Exercise Specifics 10/07/20  10/12/20   RC Rocking Full revolutions further back Lv 1 5' SS      SAQ 3#  12x SS NMR Supine SS NMR    Bridge    2x10 SS 10x SS     LAR R Pball   15x SS        Side step    NV  Resisted FW/BW, Lateral step 10# 4x each c close SPV SS GT   Leg press  NV  70#  2x10 SS    Step up    Blue 10x each          Heel raise  c ball at ankles 15x SS          Tandem stance  30s B SS          Hurdles    NV         SLR     No weight 12x SS                  (Initials = supervised exercise by clinician)     Home Exercise Program:  Access Code: [email or web address]  URL: [email or web address]  Date: 10/06/2020  Prepared by: Beatris Ship     Exercises  Standing Knee Flexion AROM with Chair Support - 2 x daily - 7 x weekly - 1 sets - 10 reps  Step Up - 2 x daily - 7 x weekly - 1 sets - 10 reps  Tandem Stance with Support - 2 x daily - 7 x weekly - 1 sets - 2 reps - 30s hold    THERAPEUTIC EXERCISE (To improve or develop Flexibility/ROM, Strength and endurance)   Warm-up-  Subjective obtained to assist with planning today's visit  Modifications/Patient Education: Progressed exercises and HEP per exercise flow sheet Cuing (Verbal, Visual and Tactile cues)  - See below    MANUAL THERAPY (To improve joint mobility, soft tissue mobility, and reduce trigger points)-Items denoted "MT" in flowsheet below  -Supine: STM to L HS, and quad,  scar tissue massage and education     NEUROMUSCULAR RE-ED (For activation and/or inhibition of target muscle,- for movement, balance, coordination, kinesthetic sense, posture, and/ or proprioception for sitting and/ or standing activities-Items denoted "NMR" in flowsheet below  -   See above    THERAPEUTIC ACTIVITY (For  improvement in the completion of ADL's and functional activities.  Dynamic activities to improve functional performance, direct (one-on-one) with the patient  - Discussed elevation and potentially obtaining compression stockings.     MODALITIES      ASSESSMENT (response to treatment):   Lynley with similar swelling in L LE. Improvement in knee flexion end of session with minimal after manual. She was challenged with resisted walking with greatest difficulty c return to CC in R side stepping and resistance L>R.              PLAN:  Patient requires continued skilled care to improve strength and ROM of L LE to return to activity.     For Next Visit Add Continue improving L LE strength, ROM, and B LE balance  Pt to have R vascular procedures for veins in July 2022    MD follow up 10/13/20                   ---      Flowsheet Row ---   Total Time    Timed Minutes 63 minutes   Total Time 63 minutes             Goals      Goal 1: Pt will improve L knee flexion to 120 deg in order to improve stair negotiation with 1 HR, with reciprocal gait, and with p! 2/10 or less.    Sessions: 22      Goal 2: Pt will improve knee flexion strength by 0.5 MMT in order to improve ability to negotiate stairs.    Sessions: 22  Goal 3: Pt will improve hip extension strength by 0.5 MMT in order to improve ability to perform floor trransfers with improved speed and with p! 2/10 or less in order to improve safety and care for grandchildren.   Sessions: 22      Goal 4: Patient will demonstrate independence in prescribed HEP with proper form, sets and reps for safe discharge to an independent program.         Sessions: 22          Goal 5: Pt will improve B SLS B to 10s in order to improve safety with ambulation.   Sessions: 22                           Dale Durham, DPT

## 2020-10-15 ENCOUNTER — Inpatient Hospital Stay: Payer: No Typology Code available for payment source | Admitting: Rehabilitative and Restorative Service Providers"

## 2020-10-19 ENCOUNTER — Inpatient Hospital Stay
Payer: No Typology Code available for payment source | Attending: Orthopaedic Surgery | Admitting: Rehabilitative and Restorative Service Providers"

## 2020-10-19 DIAGNOSIS — M25662 Stiffness of left knee, not elsewhere classified: Secondary | ICD-10-CM | POA: Insufficient documentation

## 2020-10-19 DIAGNOSIS — M25562 Pain in left knee: Secondary | ICD-10-CM | POA: Insufficient documentation

## 2020-10-19 NOTE — PT/OT Therapy Note (Signed)
Name: Wendy [name] Age: 65 y.o.   Date of Service: 10/19/2020  Referring Physician: Abe People, MD   Date of Injury: 08/03/2020  Date Care Plan Established/Reviewed: 10/06/2020  Date Treatment Started: 10/06/2020  End of Certification Date: 01/03/2021  Sessions in Plan of Care: 22  Surgery Date: 08/03/2020  MD Follow-up: No data was found  Medbridge Code: No data was found    Visit Count: 4   Diagnosis:   1. Left knee pain, unspecified chronicity    2. Knee stiffness, left        Subjective     Social Support/Occupation                   Precautions: No data was found  Allergies: Iodine, Penicillins, Shellfish-derived products, Atorvastatin, Morphine, Tetanus immune globulin, and Zocor [simvastatin]  Primary complaint/Involved Side: L TKA    SUBJECTIVE:  Patient reports she's unable to descend/ascend stairs reciprocally. She saw MD and he stated everything looked good.     Functional Limitations (PLOF):  See POC Lifting heavy objects is painful and difficult; Stairs: descending>ascending knee pain with step to gait;  Walking without shoes on is painful; Floor transfers-very slow, but not painful;     OBJECTIVE:     Initial Evaluation Reference and/or Current Measurements(as dated):     Outcome Measure:  OUTCOMES  IE      FOTO 45     Pain         Balance:  SLS R: Eyes Open (EO): 4s.  SLS L: Eyes Open (EO): 4s        Girth/Edema: L LE swollen at ankle and knee    Initial R Initial L   Mid Patellar 42 cm 46 cm   6" Above patella 55.7 cm 54.6 cm   6" below patella 36.2 cm 38.6 cm   (blank fields were intentionally left blank)     Range of Motion: (degrees)  Initial R A/PROM Knee Initial L A/PROM 10/12/20 L A/PROM   129 Flexion 97 p! 95/100 >>post tx 100/110   0 Extension Lacking 3/ 0 p! Lacking 3/0   (blank fields were intentionally left blank)        Initial  Uninvolved LE Strength  MMT /5 Initial R  Taken in Supine/prone    5 (10/07/20) Hip Flexion 5     Glute max NT p! c knee flexion   3+ Hip Extension 3+     Hip Abduction  4     Hip Adduction      4+ Quadriceps 4    4 Hamstrings 3- p!!     Ankle Dorsiflexion       Ankle Plantarflexion     (blank fields were intentionally left blank)     Special tests IE L, R   SLR Extensor lag, In tact              TREATMENT:     Exercise Flow Sheet     Exercise Specifics 10/07/20  10/12/20 10/20/20   RC Rocking Full revolutions further back Lv 1 5' SS    RC 5' rocking to full revolutions EM   SAQ 3#  12x SS NMR Supine SS NMR  NMR SAQ 3# 10x5" EM   Bridge    2x10 SS 10x SS   -   LAR R Pball   15x SS   Mini squat 2x10 EM      Side step    NV  Resisted FW/BW,  Lateral step 10# 4x each c close SPV SS GT Resisted FW/BW, lat 10# 5x EM   Leg press      NV  70#  2x10 SS  Leg press 70# with stretch for knee flex 2x10 EM   Step up    Blue 10x each     Step up blue x10, step down green x10 EM      Heel raise  c ball at ankles 15x SS           Hurdles    NV          SLR     No weight 12x SS   NMR SLR x10 EM                 (Initials = supervised exercise by clinician)     Home Exercise Program:  Access Code: [email or web address]  URL: [email or web address]  Date: 10/19/2020  Prepared by: Jayme Cloud    Exercises  Standing Knee Flexion AROM with Chair Support - 2 x daily - 7 x weekly - 1 sets - 10 reps  Step Up - 2 x daily - 7 x weekly - 1 sets - 10 reps  Tandem Stance with Support - 2 x daily - 7 x weekly - 1 sets - 2 reps - 30s hold  Long Sitting Quad Set - 5 x daily - 7 x weekly - 3 sets - 10 reps - 5 seconds hold  Supine Active Straight Leg Raise - 1 x daily - 7 x weekly - 3 sets - 10 reps - 1 hold  Supine Short Arc Quad - 1 x daily - 7 x weekly - 3 sets - 10 reps - 5 hold  Supine Heel Slides - 2 x daily - 7 x weekly - 3 sets - 10 reps - 10 seconds hold  Seated Knee Flexion Stretch - 2 x daily - 7 x weekly - 3 sets - 30 seconds hold      THERAPEUTIC EXERCISE (To improve or develop Flexibility/ROM, Strength and endurance)   Warm-up-  Subjective obtained to assist with planning today's  visit  Modifications/Patient Education: Progressed exercises and HEP per exercise flow sheet Cuing (Verbal, Visual and Tactile cues)  - See below    MANUAL THERAPY (To improve joint mobility, soft tissue mobility, and reduce trigger points)-Items denoted "MT" in flowsheet below  -Supine: STM to L HS, and quad,   Tibiofemoral post glide grade III    NEUROMUSCULAR RE-ED (For activation and/or inhibition of target muscle,- for movement, balance, coordination, kinesthetic sense, posture, and/ or proprioception for sitting and/ or standing activities-Items denoted "NMR" in flowsheet below  -   TCs for quad activation with exercises per flow sheet    THERAPEUTIC ACTIVITY (For improvement in the completion of ADL's and functional activities.  Dynamic activities to improve functional performance, direct (one-on-one) with the patient  - discussed elevation, compression, ankle pumps throughout day to decrease inflammation, ensuring she is elevating LE and performing frequent quad sets during the day as she is on feet much of day watching grandchild, discussed focusing on LLPS for knee flexion ROM    MODALITIES      ASSESSMENT (response to treatment):   Patient required VCs for improved eccentric control with step ups and step downs. She required VCs and TCs for terminal extension and quad setting with SLR and SAQ. She requires skilled PT for increased strength for ascending and descending stairs reciprocally.  PLAN:  Kinesiotape for inflammation    Pt to have R vascular procedures for veins in July 2022    MD follow up 10/13/20  Objective                                Goals      Goal 1: Pt will improve L knee flexion to 120 deg in order to improve stair negotiation with 1 HR, with reciprocal gait, and with p! 2/10 or less.    Sessions: 22      Goal 2: Pt will improve knee flexion strength by 0.5 MMT in order to improve ability to negotiate stairs.    Sessions: 22      Goal 3: Pt will improve hip extension strength by  0.5 MMT in order to improve ability to perform floor trransfers with improved speed and with p! 2/10 or less in order to improve safety and care for grandchildren.   Sessions: 22      Goal 4: Patient will demonstrate independence in prescribed HEP with proper form, sets and reps for safe discharge to an independent program.         Sessions: 22          Goal 5: Pt will improve B SLS B to 10s in order to improve safety with ambulation.   Sessions: 110                           Ledell Noss, DPT

## 2020-10-21 ENCOUNTER — Inpatient Hospital Stay
Payer: No Typology Code available for payment source | Attending: Orthopaedic Surgery | Admitting: Rehabilitative and Restorative Service Providers"

## 2020-10-21 DIAGNOSIS — M25562 Pain in left knee: Secondary | ICD-10-CM | POA: Insufficient documentation

## 2020-10-21 DIAGNOSIS — M25662 Stiffness of left knee, not elsewhere classified: Secondary | ICD-10-CM | POA: Insufficient documentation

## 2020-10-21 NOTE — PT/OT Therapy Note (Signed)
Name: EISA CONAWAY Age: 65 y.o.   Date of Service: 10/21/2020  Referring Physician: Abe People, MD   Date of Injury: 08/03/2020  Date Care Plan Established/Reviewed: 10/06/2020  Date Treatment Started: 10/06/2020  End of Certification Date: 01/03/2021  Sessions in Plan of Care: 22  Surgery Date: 08/03/2020  MD Follow-up: No data was found  Medbridge Code: No data was found    Visit Count: 5   Diagnosis:   1. Left knee pain, unspecified chronicity    2. Knee stiffness, left        Subjective     Social Support/Occupation                   Precautions: No data was found  Allergies: Iodine, Penicillins, Shellfish-derived products, Atorvastatin, Morphine, Tetanus immune globulin, and Zocor [simvastatin]  Primary complaint/Involved Side: L TKA    SUBJECTIVE:  Patient reports L knee is stiffer today following her last therapy session. She notes stiffness maybe related to going up and down the stairs more. She lives in a town house and does them several times a day.     Functional Limitations (PLOF):  See POC Lifting heavy objects is painful and difficult; Stairs: descending>ascending knee pain with step to gait;  Walking without shoes on is painful; Floor transfers-very slow, but not painful;     OBJECTIVE:     Initial Evaluation Reference and/or Current Measurements(as dated):     Outcome Measure:  OUTCOMES  IE      FOTO 45     Pain         Balance:  SLS R: Eyes Open (EO): 4s.  SLS L: Eyes Open (EO): 4s        Girth/Edema: L LE swollen at ankle and knee    Initial R Initial L   Mid Patellar 42 cm 46 cm   6" Above patella 55.7 cm 54.6 cm   6" below patella 36.2 cm 38.6 cm   (blank fields were intentionally left blank)     Range of Motion: (degrees)  Initial R A/PROM Knee Initial L A/PROM 10/12/20 L A/PROM   129 Flexion 97 p! 95/100 >>post tx 100/110   0 Extension Lacking 3/ 0 p! Lacking 3/0   (blank fields were intentionally left blank)        Initial  Uninvolved LE Strength  MMT /5 Initial R  Taken in Supine/prone    5  (10/07/20) Hip Flexion 5     Glute max NT p! c knee flexion   3+ Hip Extension 3+     Hip Abduction 4     Hip Adduction      4+ Quadriceps 4    4 Hamstrings 3- p!!     Ankle Dorsiflexion       Ankle Plantarflexion     (blank fields were intentionally left blank)     Special tests IE L, R   SLR Extensor lag, In tact              TREATMENT:     Exercise Flow Sheet     Exercise Specifics 10/07/20  10/12/20 10/20/20 10/21/20   RC Rocking Full revolutions further back Lv 1 5' SS    RC 5' rocking to full revolutions EM Knee flexion AAROM 2.5', KP   SAQ 3#  12x SS NMR Supine SS NMR  NMR SAQ 3# 10x5" EM LAQ 3# 10x 5, KP  NMR   Bridge    2x10 SS  10x SS   - Leg press 70#, 2x10, KP   LAR R Pball   15x SS   Mini squat 2x10 EM Lateral step up green to blue 2x fatigue, KP      Side step    NV  Resisted FW/BW, Lateral step 10# 4x each c close SPV SS GT Resisted FW/BW, lat 10# 5x EM Lateral step over hurdles 10x 3 hurdles, KP   Leg press      NV  70#  2x10 SS  Leg press 70# with stretch for knee flex 2x10 EM Backward walking at barre 5x, KP  NMR   Step up    Blue 10x each     Step up blue x10, step down green x10 EM Mini squat, KP      Heel raise  c ball at ankles 15x SS            Hurdles    NV           SLR     No weight 12x SS   NMR SLR x10 EM                   (Initials = supervised exercise by clinician)     Home Exercise Program:  Access Code: [email or web address]  URL: [email or web address]  Date: 10/19/2020  Prepared by: Jayme Cloud    Exercises  Standing Knee Flexion AROM with Chair Support - 2 x daily - 7 x weekly - 1 sets - 10 reps  Step Up - 2 x daily - 7 x weekly - 1 sets - 10 reps  Tandem Stance with Support - 2 x daily - 7 x weekly - 1 sets - 2 reps - 30s hold  Long Sitting Quad Set - 5 x daily - 7 x weekly - 3 sets - 10 reps - 5 seconds hold  Supine Active Straight Leg Raise - 1 x daily - 7 x weekly - 3 sets - 10 reps - 1 hold  Supine Short Arc Quad - 1 x daily - 7 x weekly - 3 sets - 10 reps - 5 hold  Supine Heel  Slides - 2 x daily - 7 x weekly - 3 sets - 10 reps - 10 seconds hold  Seated Knee Flexion Stretch - 2 x daily - 7 x weekly - 3 sets - 30 seconds hold      THERAPEUTIC EXERCISE (To improve or develop Flexibility/ROM, Strength and endurance)   Warm-up-  Subjective obtained to assist with planning today's visit  Modifications/Patient Education: Progressed exercises and HEP per exercise flow sheet Cuing (Verbal, Visual and Tactile cues)  - See below    MANUAL THERAPY (To improve joint mobility, soft tissue mobility, and reduce trigger points)-Items denoted "MT" in flowsheet below  -Supine: STM to L HS, and quad,   Tibiofemoral post glide grade III    NEUROMUSCULAR RE-ED (For activation and/or inhibition of target muscle,- for movement, balance, coordination, kinesthetic sense, posture, and/ or proprioception for sitting and/ or standing activities-Items denoted "NMR" in flowsheet below  -   TCs for quad activation with exercises per flow sheet   - Vcs for knee extension with backward walking    THERAPEUTIC ACTIVITY (For improvement in the completion of ADL's and functional activities.  Dynamic activities to improve functional performance, direct (one-on-one) with the patient  NA    MODALITIES      ASSESSMENT (response to treatment):   Patient demonstrated increased stiffness into flexion and  pain with bike. Manual therapy utilized to improve this; medial and lateral hamstrings hypertonic. Patient instructed on performance of flexion throughout the day (supine with heel slide, seated with AROM, bike). She has moderate difficulty with eccentric control during step down. Patient verbally cued for knee flexion with ambulation.     PLAN:  Kinesiotape for inflammation    Pt to have R vascular procedures for veins in July 2022    MD follow up 10/13/20  Objective                         ---      Flowsheet Row ---   Total Time    Timed Minutes 42 minutes   Total Time 42 minutes             Goals      Goal 1: Pt will improve L  knee flexion to 120 deg in order to improve stair negotiation with 1 HR, with reciprocal gait, and with p! 2/10 or less.    Sessions: 22      Goal 2: Pt will improve knee flexion strength by 0.5 MMT in order to improve ability to negotiate stairs.    Sessions: 22      Goal 3: Pt will improve hip extension strength by 0.5 MMT in order to improve ability to perform floor trransfers with improved speed and with p! 2/10 or less in order to improve safety and care for grandchildren.   Sessions: 22      Goal 4: Patient will demonstrate independence in prescribed HEP with proper form, sets and reps for safe discharge to an independent program.         Sessions: 22          Goal 5: Pt will improve B SLS B to 10s in order to improve safety with ambulation.   Sessions: 22                           Joycie Peek, DPT

## 2020-10-26 ENCOUNTER — Inpatient Hospital Stay: Payer: No Typology Code available for payment source | Admitting: Rehabilitative and Restorative Service Providers"

## 2020-10-28 ENCOUNTER — Inpatient Hospital Stay
Payer: No Typology Code available for payment source | Attending: Orthopaedic Surgery | Admitting: Rehabilitative and Restorative Service Providers"

## 2020-10-28 DIAGNOSIS — M25562 Pain in left knee: Secondary | ICD-10-CM | POA: Insufficient documentation

## 2020-10-28 DIAGNOSIS — M25662 Stiffness of left knee, not elsewhere classified: Secondary | ICD-10-CM | POA: Insufficient documentation

## 2020-10-28 NOTE — PT/OT Therapy Note (Signed)
Name: Wendy [name] Age: 65 y.o.   Date of Service: 10/28/2020  Referring Physician: Abe People, MD   Date of Injury: 08/03/2020  Date Care Plan Established/Reviewed: 10/06/2020  Date Treatment Started: 10/06/2020  End of Certification Date: 01/03/2021  Sessions in Plan of Care: 22  Surgery Date: 08/03/2020  MD Follow-up: No data was found  Medbridge Code: No data was found    Visit Count: 6   Diagnosis:   1. Left knee pain, unspecified chronicity    2. Knee stiffness, left        Subjective     Social Support/Occupation                   Precautions: No data was found  Allergies: Iodine, Penicillins, Shellfish-derived products, Atorvastatin, Morphine, Tetanus immune globulin, and Zocor [simvastatin]  Primary complaint/Involved Side: L TKA    SUBJECTIVE:  Patient reports L knee flexion continue to be most difficult. Biking at the gym makes her more stiff and uncomfortable during the day.     Functional Limitations (PLOF):  See POC Lifting heavy objects is painful and difficult; Stairs: descending>ascending knee pain with step to gait;  Walking without shoes on is painful; Floor transfers-very slow, but not painful;     OBJECTIVE:     Initial Evaluation Reference and/or Current Measurements(as dated):     Outcome Measure:  OUTCOMES  IE      FOTO 45     Pain         Balance:  SLS R: Eyes Open (EO): 4s.  SLS L: Eyes Open (EO): 4s        Girth/Edema: L LE swollen at ankle and knee    Initial R Initial L   Mid Patellar 42 cm 46 cm   6" Above patella 55.7 cm 54.6 cm   6" below patella 36.2 cm 38.6 cm   (blank fields were intentionally left blank)     Range of Motion: (degrees)  Initial R A/PROM Knee Initial L A/PROM 10/12/20 L A/PROM 10/28/20 L AROM   129 Flexion 97 p! 95/100 >>post tx 100/110 Post tx - 100   0 Extension Lacking 3/ 0 p! Lacking 3/0    (blank fields were intentionally left blank)        Initial  Uninvolved LE Strength  MMT /5 Initial R  Taken in Supine/prone     5 (10/07/20) Hip Flexion 5      Glute max NT p! c knee  flexion    3+ Hip Extension 3+      Hip Abduction 4      Hip Adduction       4+ Quadriceps 4     4 Hamstrings 3- p!!      Ankle Dorsiflexion        Ankle Plantarflexion      (blank fields were intentionally left blank)     Special tests IE L, R   SLR Extensor lag, In tact              TREATMENT:     Exercise Flow Sheet     Exercise Specifics 10/07/20  10/12/20 10/20/20 10/21/20 10/28/20   RC Rocking Full revolutions further back Lv 1 5' SS    RC 5' rocking to full revolutions EM Knee flexion AAROM 2.5', KP Seated knee flexion with OP, 5x with hold as tolerated, KP   SAQ 3#  12x SS NMR Supine SS NMR  NMR SAQ 3# 10x5" EM  LAQ 3# 10x 5, KP  NMR Wedge stretch, KP   Bridge    2x10 SS 10x SS   - Leg press 70#, 2x10, KP Leg press - NV   LAR R Pball   15x SS   Mini squat 2x10 EM Lateral step up green to blue 2x fatigue, KP L Forward step up/down   Green to SLS, fatigue, KP      Side step    NV  Resisted FW/BW, Lateral step 10# 4x each c close SPV SS GT Resisted FW/BW, lat 10# 5x EM Lateral step over hurdles 10x 3 hurdles, KP R Lateral step up/down, green step, fatigue, KP   Leg press      NV  70#  2x10 SS  Leg press 70# with stretch for knee flex 2x10 EM Backward walking at barre 5x, KP  NMR Side step band - NV   Step up    Blue 10x each     Step up blue x10, step down green x10 EM Mini squat, KP Mini squat - NV      Heel raise  c ball at ankles 15x SS       3 way cone tap 2x5 R and L, KP      Hurdles    NV            SLR     No weight 12x SS   NMR SLR x10 EM                     (Initials = supervised exercise by clinician)     Home Exercise Program:  Access Code: [email or web address]  URL: [email or web address]  Date: 10/19/2020  Prepared by: Jayme Cloud    Exercises  Standing Knee Flexion AROM with Chair Support - 2 x daily - 7 x weekly - 1 sets - 10 reps  Step Up - 2 x daily - 7 x weekly - 1 sets - 10 reps  Tandem Stance with Support - 2 x daily - 7 x weekly - 1 sets - 2 reps - 30s hold  Long Sitting Quad Set - 5 x daily - 7  x weekly - 3 sets - 10 reps - 5 seconds hold  Supine Active Straight Leg Raise - 1 x daily - 7 x weekly - 3 sets - 10 reps - 1 hold  Supine Short Arc Quad - 1 x daily - 7 x weekly - 3 sets - 10 reps - 5 hold  Supine Heel Slides - 2 x daily - 7 x weekly - 3 sets - 10 reps - 10 seconds hold  Seated Knee Flexion Stretch - 2 x daily - 7 x weekly - 3 sets - 30 seconds hold      THERAPEUTIC EXERCISE (To improve or develop Flexibility/ROM, Strength and endurance)   Warm-up-  Subjective obtained to assist with planning today's visit  Modifications/Patient Education: Progressed exercises and HEP per exercise flow sheet Cuing (Verbal, Visual and Tactile cues)  - See below    MANUAL THERAPY (To improve joint mobility, soft tissue mobility, and reduce trigger points)-Items denoted "MT" in flowsheet below  -Seated: TF distraction grade III c PROM knee flexion, STM hamstring    NEUROMUSCULAR RE-ED (For activation and/or inhibition of target muscle,- for movement, balance, coordination, kinesthetic sense, posture, and/ or proprioception for sitting and/ or standing activities-Items denoted "NMR" in flowsheet below  -   TCs for quad activation with exercises per flow sheet   -  Vcs for knee extension with backward walking    THERAPEUTIC ACTIVITY (For improvement in the completion of ADL's and functional activities.  Dynamic activities to improve functional performance, direct (one-on-one) with the patient  NA    MODALITIES      ASSESSMENT (response to treatment):   Patient demonstrated 10 degree improvement in knee flexion following manual therapy. Patient instructed to perform knee flexion stretch daily. Patient continues to require verbal cues for allowing knee flexion during stepping exercises. Updated HEP next therapy session.   PLAN:    Pt to have R vascular procedures for veins in July 2022    MD follow up 10/13/20  Objective                         ---      Flowsheet Row ---   Total Time    Timed Minutes 39 minutes   Total  Time 39 minutes             Goals      Goal 1: Pt will improve L knee flexion to 120 deg in order to improve stair negotiation with 1 HR, with reciprocal gait, and with p! 2/10 or less.   Progress, 10/28/20, KP   Sessions: 22      Goal 2: Pt will improve knee flexion strength by 0.5 MMT in order to improve ability to negotiate stairs.    Sessions: 22      Goal 3: Pt will improve hip extension strength by 0.5 MMT in order to improve ability to perform floor trransfers with improved speed and with p! 2/10 or less in order to improve safety and care for grandchildren.   Sessions: 22      Goal 4: Patient will demonstrate independence in prescribed HEP with proper form, sets and reps for safe discharge to an independent program.         Sessions: 22          Goal 5: Pt will improve B SLS B to 10s in order to improve safety with ambulation.   Sessions: 22                           Joycie Peek, DPT

## 2020-11-02 ENCOUNTER — Inpatient Hospital Stay: Payer: No Typology Code available for payment source | Attending: Orthopaedic Surgery

## 2020-11-02 DIAGNOSIS — M25562 Pain in left knee: Secondary | ICD-10-CM | POA: Insufficient documentation

## 2020-11-02 DIAGNOSIS — M25662 Stiffness of left knee, not elsewhere classified: Secondary | ICD-10-CM | POA: Insufficient documentation

## 2020-11-02 NOTE — PT/OT Therapy Note (Signed)
Name: Wendy [name] Age: 65 y.o.   Date of Service: 11/02/2020  Referring Physician: Abe People, MD   Date of Injury: 08/03/2020  Date Care Plan Established/Reviewed: 10/06/2020  Date Treatment Started: 10/06/2020  End of Certification Date: 01/03/2021  Sessions in Plan of Care: 22  Surgery Date: 08/03/2020  MD Follow-up: No data was found  Medbridge Code: No data was found    Visit Count: 7   Diagnosis:   1. Left knee pain, unspecified chronicity    2. Knee stiffness, left        Subjective     Social Support/Occupation                   Precautions: No data was found  Allergies: Iodine, Penicillins, Shellfish-derived products, Atorvastatin, Morphine, Tetanus immune globulin, and Zocor [simvastatin]  Primary complaint/Involved Side: L TKA    SUBJECTIVE:  Pt reports knee flexion is a little better, but still better than it was. Patient reports L knee flexion continue to be most difficult. Biking at the gym makes her more stiff and uncomfortable during the day. Felt ok after last session.     11/02/20 Functional Limitations (PLOF):  See POC Lifting heavy objects is painful and difficult; Stairs: descending>ascending knee pain with reciprocal gait, slow, use of 1 HR; Walking without shoes on is painful-not as bad, but still tries not to; Floor transfers-very slow, but not painful-shifts to the R to get back up, or uses pillows to cushion the L.      OBJECTIVE:     Initial Evaluation Reference and/or Current Measurements(as dated):     Outcome Measure:  OUTCOMES  IE      FOTO 45     Pain         Balance:  SLS R: Eyes Open (EO): 4s.; 11/02/20:   SLS L: Eyes Open (EO): 4s   11/02/20:      Girth/Edema: L LE swollen at ankle and knee    Initial R Initial L   Mid Patellar 42 cm 46 cm   6" Above patella 55.7 cm 54.6 cm   6" below patella 36.2 cm 38.6 cm   (blank fields were intentionally left blank)     Range of Motion: (degrees)  Initial R A/PROM Knee Initial L A/PROM 10/12/20 L A/PROM 10/28/20 L AROM 11/02/20 L AROM   129 Flexion 97  p! 95/100 >>post tx 100/110 Post tx - 100 100-post tx 100   0 Extension Lacking 3/ 0 p! Lacking 3/0     (blank fields were intentionally left blank)        Initial  Uninvolved LE Strength  MMT /5 Initial R  Taken in Supine/prone 11/02/20 L    5 (10/07/20) Hip Flexion 5 5     Glute max NT p! c knee flexion    3+ Hip Extension 3+ 4+     Hip Abduction 4 4+     Hip Adduction       4+ Quadriceps 4 4+ discomfort    4 Hamstrings 3- p!! 4- p! In popliteal     Ankle Dorsiflexion        Ankle Plantarflexion      (blank fields were intentionally left blank)     Special tests IE L, R   SLR Extensor lag, In tact              TREATMENT:     Exercise Flow Sheet     Exercise Specifics 10/07/20  10/12/20 10/20/20 10/21/20 10/28/20 11/02/20    RC Rocking Full revolutions further back Lv 1 5' SS    RC 5' rocking to full revolutions EM Knee flexion AAROM 2.5', KP Seated knee flexion with OP, 5x with hold as tolerated, KP SS    SAQ 3#  12x SS NMR Supine SS NMR  NMR SAQ 3# 10x5" EM LAQ 3# 10x 5, KP  NMR Wedge stretch, KP 2x30s SS    Bridge    2x10 SS 10x SS   - Leg press 70#, 2x10, KP Leg press - NV 70# >90 # 12x each SS     LAR R Pball   15x SS   Mini squat 2x10 EM Lateral step up green to blue 2x fatigue, KP L Forward step up/down   Green to SLS, fatigue, KP L FW step up 4" C pelvic neutral cues SS NMR       Side step    NV  Resisted FW/BW, Lateral step 10# 4x each c close SPV SS GT Resisted FW/BW, lat 10# 5x EM Lateral step over hurdles 10x 3 hurdles, KP R Lateral step up/down, green step, fatigue, KP R Lateral step up/down, green step, fatigue, SS NMR    Leg press      NV  70#  2x10 SS  Leg press 70# with stretch for knee flex 2x10 EM Backward walking at barre 5x, KP  NMR Side step band - NV RTB at thighs 2x20' SS    Step up    Blue 10x each     Step up blue x10, step down green x10 EM Mini squat, KP Mini squat - NV Mini Squat 2x10 SS        Heel raise  c ball at ankles 15x SS       3 way cone tap 2x5 R and L, KP 3 way cone tap 2x5 R and L,        Hurdles    NV              SLR     No weight 12x SS   NMR SLR x10 EM                         (Initials = supervised exercise by clinician)     Home Exercise Program:  Access Code: [email or web address]  URL: [email or web address]  Date: 11/02/2020  Prepared by: Beatris Ship    Exercises  Step Up - 2 x daily - 7 x weekly - 1 sets - 10 reps  Lateral Step Up - 2 x daily - 7 x weekly - 1 sets - 10 reps  Single Leg Balance with Clock Reach - 2 x daily - 7 x weekly - 1 sets - 10 reps  Mini Squat with Counter Support - 2 x daily - 7 x weekly - 1 sets - 10 reps  Tandem Stance - 2 x daily - 7 x weekly - 1 sets - 10 reps      THERAPEUTIC EXERCISE (To improve or develop Flexibility/ROM, Strength and endurance)   Warm-up-  Subjective obtained to assist with planning today's visit  Modifications/Patient Education: Progressed exercises and HEP per exercise flow sheet Cuing (Verbal, Visual and Tactile cues)  - See below    MANUAL THERAPY (To improve joint mobility, soft tissue mobility, and reduce trigger points)-Items denoted "MT" in flowsheet below  -Seated: TF distraction grade III c PROM knee flexion, STM hamstring  -Instructed in  HS massage c massage stick    NEUROMUSCULAR RE-ED (For activation and/or inhibition of target muscle,- for movement, balance, coordination, kinesthetic sense, posture, and/ or proprioception for sitting and/ or standing activities-Items denoted "NMR" in flowsheet below  - TCs for quad activation with exercises per flow sheet   - Vcs for knee extension with backward walking    THERAPEUTIC ACTIVITY (For improvement in the completion of ADL's and functional activities.  Dynamic activities to improve functional performance, direct (one-on-one) with the patient  NA    MODALITIES      ASSESSMENT (response to treatment):  [place] with significant improvement in strength and balance of the L LE. Continues to demonstrate decreased knee flexion to 100 deg despite manual. Challenged with maintaining pelvic  neutral during step ups this day. Tolerated session well other than with PROM knee flexion.     PLAN:  Continued skilled PT required for improving knee ROM, strength and stability required for ADLs and caring for young grandchildren.     Pt to have R vascular procedures for veins in July 2022    MD follow up 10/13/20  Objective                         ---      Flowsheet Row ---   Total Time    Timed Minutes 46 minutes   Total Time 46 minutes             Goals      Goal 1: Pt will improve L knee flexion to 120 deg in order to improve stair negotiation with 1 HR, with reciprocal gait, and with p! 2/10 or less.   Progress, 10/28/20, KP   Sessions: 22      Goal 2: Pt will improve knee flexion strength by 0.5 MMT in order to improve ability to negotiate stairs.     Progress 11/02/20 SS   Sessions: 22      Goal 3: Pt will improve hip extension strength by 0.5 MMT in order to improve ability to perform floor trransfers with improved speed and with p! 2/10 or less in order to improve safety and care for grandchildren.    Progress 11/02/20 SS   Sessions: 22      Goal 4: Patient will demonstrate independence in prescribed HEP with proper form, sets and reps for safe discharge to an independent program.    Progress 11/02/20 SS   Sessions: 22          Goal 5: Pt will improve B SLS B to 10s in order to improve safety with ambulation.   Sessions: 22                           Dale Durham, DPT

## 2020-11-04 ENCOUNTER — Inpatient Hospital Stay: Payer: No Typology Code available for payment source

## 2020-11-17 ENCOUNTER — Inpatient Hospital Stay: Payer: No Typology Code available for payment source | Attending: Orthopaedic Surgery

## 2020-11-17 DIAGNOSIS — M25562 Pain in left knee: Secondary | ICD-10-CM | POA: Insufficient documentation

## 2020-11-17 DIAGNOSIS — M25662 Stiffness of left knee, not elsewhere classified: Secondary | ICD-10-CM | POA: Insufficient documentation

## 2020-11-17 NOTE — PT/OT Therapy Note (Signed)
Name: TABOR RODINE Age: 65 y.o.   Date of Service: 11/17/2020  Referring Physician: Abe People, MD   Date of Injury: 08/03/2020  Date Care Plan Established/Reviewed: 10/06/2020  Date Treatment Started: 10/06/2020  End of Certification Date: 01/03/2021  Sessions in Plan of Care: 22  Surgery Date: 08/03/2020  MD Follow-up: No data was found  Medbridge Code: No data was found    Visit Count: 8   Diagnosis:   1. Left knee pain, unspecified chronicity    2. Knee stiffness, left        Subjective     Social Support/Occupation                   Precautions: No data was found  Allergies: Iodine, Penicillins, Shellfish-derived products, Atorvastatin, Morphine, Tetanus immune globulin, and Zocor [simvastatin]  Primary complaint/Involved Side: L TKA    SUBJECTIVE:  Lexey reports that her knee is moving a bit better, but still not 100%. Been doing HEP. Denies hx of blood clots, active or PMH of CA. Has F/U Aug 8th.     11/02/20 Functional Limitations (PLOF):  See POC Lifting heavy objects is painful and difficult; Stairs: descending>ascending knee pain with reciprocal gait, slow, use of 1 HR; Walking without shoes on is painful-not as bad, but still tries not to; Floor transfers-very slow, but not painful-shifts to the R to get back up, or uses pillows to cushion the L.      OBJECTIVE:     Initial Evaluation Reference and/or Current Measurements(as dated):     Outcome Measure:  OUTCOMES  IE      FOTO 45     Pain         Balance:  SLS R: Eyes Open (EO): 4s.; 11/17/20: 21s  SLS L: Eyes Open (EO): 4s   11/17/20: 23s     Girth/Edema: L LE swollen at ankle and knee    Initial R Initial L 11/17/20  R, L    Mid Patellar 42 cm 46 cm 40.3, 45.2 cm   6" Above patella 55.7 cm 54.6 cm 55, 53.3   6" below patella 36.2 cm 38.6 cm 35.6, 35 cm   (blank fields were intentionally left blank)     Range of Motion: (degrees)  Initial R A/PROM Knee Initial L A/PROM 10/12/20 L A/PROM 10/28/20 L AROM 11/02/20 L AROM 11/17/20 L AROM   129 Flexion 97 p! 95/100  >>post tx 100/110 Post tx - 100 100-post tx 100 95/100-110 post tx    0 Extension Lacking 3/ 0 p! Lacking 3/0      (blank fields were intentionally left blank)        Initial  Uninvolved LE Strength  MMT /5 Initial R  Taken in Supine/prone 11/02/20 L 11/17/20 L    5 (10/07/20) Hip Flexion 5 5      Glute max NT p! c knee flexion     3+ Hip Extension 3+ 4+      Hip Abduction 4 4+      Hip Adduction        4+ Quadriceps 4 4+ discomfort 4+    4 Hamstrings 3- p!! 4- p! In popliteal 4+      Ankle Dorsiflexion         Ankle Plantarflexion       (blank fields were intentionally left blank)     Special tests IE L, R   SLR Extensor lag, In tact  TREATMENT:     Exercise Flow Sheet     Exercise Specifics 10/07/20  10/12/20 10/20/20 10/21/20 10/28/20 11/02/20 11/17/20   RC Rocking Full revolutions further back Lv 1 5' SS    RC 5' rocking to full revolutions EM Knee flexion AAROM 2.5', KP Seated knee flexion with OP, 5x with hold as tolerated, KP SS    SAQ 3#  12x SS NMR Supine SS NMR  NMR SAQ 3# 10x5" EM LAQ 3# 10x 5, KP  NMR Wedge stretch, KP 2x30s SS    Bridge    2x10 SS 10x SS   - Leg press 70#, 2x10, KP Leg press - NV 70# >90 # 12x each SS  DL leg press 09#, SL 60#  2x12 SS    LAR R Pball   15x SS   Mini squat 2x10 EM Lateral step up green to blue 2x fatigue, KP L Forward step up/down   Green to SLS, fatigue, KP L FW step up 4" C pelvic neutral cues SS NMR L FW Step up with eccentric lowering cues SS NMR      Side step    NV  Resisted FW/BW, Lateral step 10# 4x each c close SPV SS GT Resisted FW/BW, lat 10# 5x EM Lateral step over hurdles 10x 3 hurdles, KP R Lateral step up/down, green step, fatigue, KP R Lateral step up/down, green step, fatigue, SS NMR    Leg press      NV  70#  2x10 SS  Leg press 70# with stretch for knee flex 2x10 EM Backward walking at barre 5x, KP  NMR Side step band - NV RTB at thighs 2x20' SS    Step up    Blue 10x each     Step up blue x10, step down green x10 EM Mini squat, KP Mini squat - NV Mini  Squat 2x10 SS        Heel raise  c ball at ankles 15x SS       3 way cone tap 2x5 R and L, KP 3 way cone tap 2x5 R and L,       Hurdles    NV       Standing HS curl RTB at thighs clotth       SLR     No weight 12x SS   NMR SLR x10 EM                         (Initials = supervised exercise by clinician)     Home Exercise Program:  Access Code: [email or web address]  URL: [email or web address]  Date: 11/02/2020  Prepared by: Beatris Ship    Exercises  Step Up - 2 x daily - 7 x weekly - 1 sets - 10 reps  Lateral Step Up - 2 x daily - 7 x weekly - 1 sets - 10 reps  Single Leg Balance with Clock Reach - 2 x daily - 7 x weekly - 1 sets - 10 reps  Mini Squat with Counter Support - 2 x daily - 7 x weekly - 1 sets - 10 reps  Tandem Stance - 2 x daily - 7 x weekly - 1 sets - 10 reps      THERAPEUTIC EXERCISE (To improve or develop Flexibility/ROM, Strength and endurance)   Warm-up-  Subjective obtained to assist with planning today's visit  Modifications/Patient Education: Progressed exercises and HEP per exercise flow sheet Cuing (Verbal, Visual and Tactile cues)  -  See below    MANUAL THERAPY (To improve joint mobility, soft tissue mobility, and reduce trigger points)-Items denoted "MT" in flowsheet below  -Seated: TF distraction grade III c PROM knee flexion, STM hamstring  -Instructed in HS massage c massage stick    NEUROMUSCULAR RE-ED (For activation and/or inhibition of target muscle,- for movement, balance, coordination, kinesthetic sense, posture, and/ or proprioception for sitting and/ or standing activities-Items denoted "NMR" in flowsheet below  - TCs for quad activation with exercises per flow sheet   - Vcs for knee extension with backward walking    THERAPEUTIC ACTIVITY (For improvement in the completion of ADL's and functional activities.  Dynamic activities to improve functional performance, direct (one-on-one) with the patient  NA    MODALITIES      ASSESSMENT (response to treatment):  [place]olea with continued  improvement in knee strength significant improvement in strength and balance of the L LE. Similar knee ROM, with improvement of 10 deg after manual. Emphasized continued improvement of knee flexion. Continues to be challenged with eccentric lowering from step up. Noted to have similar swelling with Grade 2+ pitting edema on the L shin. No other signs and symptoms of DVT. Suggest pt bring up to MD at next F/U and seek sooner assist should symptoms occur.     PLAN:  Continued skilled PT required for improving knee ROM, strength and stability required for ADLs and caring for young grandchildren.     Next: FOTO and PN    Pt to have R vascular procedures for veins-for cosmetic issues in September.     MD follow up 11/29/20  Objective                         ---      Flowsheet Row ---   Total Time    Timed Minutes 43 minutes   Total Time 43 minutes               Goals      Goal 1: Pt will improve L knee flexion to 120 deg in order to improve stair negotiation with 1 HR, with reciprocal gait, and with p! 2/10 or less.   Progress-similar ROM-110 deg after manual, improvement in function, 11/17/20, SS   Sessions: 22      Goal 2: Pt will improve knee flexion strength by 0.5 MMT in order to improve ability to negotiate stairs.     Progress-improvement in MMT from 4 to 4+, continued functional challenge, though improved 11/17/20 SS   Sessions: 22      Goal 3: Pt will improve hip extension strength by 0.5 MMT in order to improve ability to perform floor trransfers with improved speed and with p! 2/10 or less in order to improve safety and care for grandchildren.    Progress from 3+ to 4+, improved functional goal, still challenging 11/02/20 SS   Sessions: 22      Goal 4: Patient will demonstrate independence in prescribed HEP with proper form, sets and reps for safe discharge to an independent program.    Progress 11/17/20 SS   Sessions: 22          Goal 5: Pt will improve B SLS B to 10s in order to improve safety with  ambulation.    Goal met -improved from 4s to 21s7/27/22 SS   Sessions: 22  Dale Durham, DPT

## 2020-11-24 ENCOUNTER — Inpatient Hospital Stay: Payer: No Typology Code available for payment source | Attending: Orthopaedic Surgery

## 2020-11-24 DIAGNOSIS — M25562 Pain in left knee: Secondary | ICD-10-CM | POA: Insufficient documentation

## 2020-11-24 DIAGNOSIS — M25662 Stiffness of left knee, not elsewhere classified: Secondary | ICD-10-CM | POA: Insufficient documentation

## 2020-11-24 NOTE — Progress Notes (Signed)
Name:Wendy [name] Age: 65 y.o.   Date of Service: 11/24/2020  Referring Physician: Abe People, MD   Date of Injury: 08/03/2020  Date Care Plan Established/Reviewed: 10/06/2020  Date Treatment Started: 10/06/2020  End of Certification Date: 01/03/2021  Sessions in Plan of Care: 22  Surgery Date: 08/03/2020  MD Follow-up: No data was found  Medbridge Code: No data was found    Visit Count: 9   Diagnosis:   1. Left knee pain, unspecified chronicity    2. Knee stiffness, left        Subjective     Social Support/Occupation                   Precautions: No data was found  Allergies: Iodine, Penicillins, Shellfish-derived products, Atorvastatin, Morphine, Tetanus immune globulin, and Zocor [simvastatin]    Past Medical History:   Diagnosis Date    Brain tumor (benign)     Hypercholesteremia     Hypertensive disorder              PROGRESS REPORT / DISCHARGE SUMMARY    Patient Participation:   Clinical Treatment:  Manual treatment, exercise per protocol  [] Traction    [] Patient Education   [] Instructions in Home Program  [] ROM / Stretching   [] Balance Training  [] Soft Tissue Mobilization/ Joint Mob  [] Ice/Heat   [] Gait Training  []  Strengthening  [] Ultrasound/ES  []  Body Mechanics  [] Other ___________________    SUBJECTIVEToniann [name] with reports of feeling better. Reports can go up and down the stairs easier, but still slow. Joined a gym today. Reports stopping icing because seemed like it was making her knee worse. Feels like it is better after stopping that. Still cannot bend my knee. Feels like it is something on the back (points to medial HS). Does not get onto floor because of pain.     Objective:            Outcome Measure:  OUTCOMES  IE  11/24/20    FOTO 45 61    Pain          Balance:  SLS R: Eyes Open (EO): 4s.; 11/17/20: 21s  SLS L: Eyes Open (EO): 4s   11/17/20: 23s     Girth/Edema: L LE swollen at ankle and knee    Initial R Initial L 11/17/20  R, L    Mid Patellar 42 cm 46 cm 40.3, 45.2 cm   6" Above patella 55.7 cm 54.6  cm 55, 53.3   6" below patella 36.2 cm 38.6 cm 35.6, 35 cm   (blank fields were intentionally left blank)     Range of Motion: (degrees)  Initial R A/PROM Knee Initial L A/PROM 10/12/20 L A/PROM 10/28/20 L AROM 11/02/20 L AROM 11/17/20 L AROM 11/24/20 L A.PROM   129 Flexion 97 p! 95/100 >>post tx 100/110 Post tx - 100 100-post tx 100 95/100-110 post tx  100/106 p!! > post tx 107   0 Extension Lacking 3/ 0 p! Lacking 3/0           (blank fields were intentionally left blank)        Initial  Uninvolved LE Strength  MMT /5 Initial R  Taken in Supine/prone 11/02/20 L 11/17/20 L    5 (10/07/20) Hip Flexion 5 5       Glute max NT p! c knee flexion       3+ Hip Extension 3+ 4+       Hip Abduction  4 4+       Hip Adduction          4+ Quadriceps 4 4+ discomfort 4+    4 Hamstrings 3- p!! 4- p! In popliteal 4+      Ankle Dorsiflexion           Ankle Plantarflexion         (blank fields were intentionally left blank)    Goal Status: see below    ASSESSMENT (response to treatment):  Wendy [name] returns to 9th visit with overall improvement in function, but reports challenge to bend knee. Unable to perform knee flexion due to pain in the posterior aspect of knee. Pt limitations may be related to continued inflammation that is present. Reported pain relief with HS mobilization in half kneeling position, though ROM had no change. Greatest change noted after performing cupping to HS. Challenged well with bosu with mod perturbations without LOB and minimal use of hands on flat side, though required use of hands for step up onto blue compliant surface of bosu.      PLAN:  Continued skilled PT required for improving knee ROM, strength and stability required for ADLs and caring for young grandchildren.     Beatris Ship PT, DPT   [facility]   [address], [address]  [place], [place] [address]  [phone] Opt 4    Signature: __________________________       Objective                         ---      Flowsheet Row  ---   Total Time    Timed Minutes 43 minutes   Total Time 43 minutes             Goals      Goal 1: Pt will improve L knee flexion to 120 deg in order to improve stair negotiation with 1 HR, with reciprocal gait, and with p! 2/10 or less.   Progress-similar ROM-110 deg after manual, improvement in function, 11/17/20, SS   Sessions: 22      Goal 2: Pt will improve knee flexion strength by 0.5 MMT in order to improve ability to negotiate stairs.     Progress-improvement in MMT from 4 to 4+, continued functional challenge, though improved 11/17/20 SS   Sessions: 22      Goal 3: Pt will improve hip extension strength by 0.5 MMT in order to improve ability to perform floor trransfers with improved speed and with p! 2/10 or less in order to improve safety and care for grandchildren.    Progress from 3+ to 4+, improved functional goal, still challenging 11/02/20 SS   Sessions: 22      Goal 4: Patient will demonstrate independence in prescribed HEP with proper form, sets and reps for safe discharge to an independent program.    Progress 11/17/20 SS   Sessions: 22          Goal 5: Pt will improve B SLS B to 10s in order to improve safety with ambulation.    Goal met -improved from 4s to 21s7/27/22 SS   Sessions: 22                           Dale Durham, DPT

## 2020-11-24 NOTE — PT/OT Therapy Note (Signed)
Name: Wendy [name] Age: 65 y.o.   Date of Service: 11/24/2020  Referring Physician: Abe People, MD   Date of Injury: 08/03/2020  Date Care Plan Established/Reviewed: 10/06/2020  Date Treatment Started: 10/06/2020  End of Certification Date: 01/03/2021  Sessions in Plan of Care: 22  Surgery Date: 08/03/2020  MD Follow-up: No data was found  Medbridge Code: No data was found    Visit Count: 9   Diagnosis:   1. Left knee pain, unspecified chronicity    2. Knee stiffness, left        Subjective     Social Support/Occupation                   Precautions: No data was found  Allergies: Iodine, Penicillins, Shellfish-derived products, Atorvastatin, Morphine, Tetanus immune globulin, and Zocor [simvastatin]  Primary complaint/Involved Side: L TKA    SUBJECTIVE:  Raidyn with reports of feeling better. Reports can go up and down the stairs easier, but still slow. Joined a gym today. Reports stopping icing because seemed like it was making her knee worse. Feels like it is better after stopping that. Still cannot bend my knee. Feels like it is something on the back (points to medial HS). Does not get onto floor because of pain.     11/02/20 Functional Limitations (PLOF):  See POC Lifting heavy objects is painful and difficult; Stairs: descending>ascending knee pain with reciprocal gait, slow, use of 1 HR; Walking without shoes on is painful-not as bad, but still tries not to; Floor transfers-very slow, but not painful-shifts to the R to get back up, or uses pillows to cushion the L.      OBJECTIVE:     Initial Evaluation Reference and/or Current Measurements(as dated):     Outcome Measure:  OUTCOMES  IE  11/24/20    FOTO 45 61    Pain         Balance:  SLS R: Eyes Open (EO): 4s.; 11/17/20: 21s  SLS L: Eyes Open (EO): 4s   11/17/20: 23s     Girth/Edema: L LE swollen at ankle and knee    Initial R Initial L 11/17/20  R, L    Mid Patellar 42 cm 46 cm 40.3, 45.2 cm   6" Above patella 55.7 cm 54.6 cm 55, 53.3   6" below patella 36.2 cm 38.6 cm  35.6, 35 cm   (blank fields were intentionally left blank)     Range of Motion: (degrees)  Initial R A/PROM Knee Initial L A/PROM 10/12/20 L A/PROM 10/28/20 L AROM 11/02/20 L AROM 11/17/20 L AROM 11/24/20 L A.PROM   129 Flexion 97 p! 95/100 >>post tx 100/110 Post tx - 100 100-post tx 100 95/100-110 post tx  100/106 p!! > post tx 107   0 Extension Lacking 3/ 0 p! Lacking 3/0       (blank fields were intentionally left blank)        Initial  Uninvolved LE Strength  MMT /5 Initial R  Taken in Supine/prone 11/02/20 L 11/17/20 L    5 (10/07/20) Hip Flexion 5 5      Glute max NT p! c knee flexion     3+ Hip Extension 3+ 4+      Hip Abduction 4 4+      Hip Adduction        4+ Quadriceps 4 4+ discomfort 4+    4 Hamstrings 3- p!! 4- p! In popliteal 4+  Ankle Dorsiflexion         Ankle Plantarflexion       (blank fields were intentionally left blank)     Special tests IE L, R   SLR Extensor lag, In tact              TREATMENT:     Exercise Flow Sheet     Exercise Specifics 10/07/20  10/12/20 10/20/20 10/21/20 10/28/20 11/02/20 11/17/20 11/24/20   RC Rocking Full revolutions further back Lv 1 5' SS    RC 5' rocking to full revolutions EM Knee flexion AAROM 2.5', KP Seated knee flexion with OP, 5x with hold as tolerated, KP SS     SAQ 3#  12x SS NMR Supine SS NMR  NMR SAQ 3# 10x5" EM LAQ 3# 10x 5, KP  NMR Wedge stretch, KP 2x30s SS     Bridge    2x10 SS 10x SS   - Leg press 70#, 2x10, KP Leg press - NV 70# >90 # 12x each SS  DL leg press 16#, SL 10#  2x12 SS  DL Leg press 96# c increased knee flexion angle 2x15 SS    LAR R Pball   15x SS   Mini squat 2x10 EM Lateral step up green to blue 2x fatigue, KP L Forward step up/down   Green to SLS, fatigue, KP L FW step up 4" C pelvic neutral cues SS NMR L FW Step up with eccentric lowering cues SS NMR L FW step down 4" c       Side step    NV  Resisted FW/BW, Lateral step 10# 4x each c close SPV SS GT Resisted FW/BW, lat 10# 5x EM Lateral step over hurdles 10x 3 hurdles, KP R Lateral step up/down,  green step, fatigue, KP R Lateral step up/down, green step, fatigue, SS NMR  Bosu -black stand 30s and mini squats 3x5 SS NMR   Leg press      NV  70#  2x10 SS  Leg press 70# with stretch for knee flex 2x10 EM Backward walking at barre 5x, KP  NMR Side step band - NV RTB at thighs 2x20' SS  Step up FW bosu blue c UE assist x1 and SPV 10x each SS NMR   Step up    Blue 10x each     Step up blue x10, step down green x10 EM Mini squat, KP Mini squat - NV Mini Squat 2x10 SS   Floor transfers SS TA      Heel raise  c ball at ankles 15x SS       3 way cone tap 2x5 R and L, KP 3 way cone tap 2x5 R and L,        Hurdles    NV       Standing HS curl RTB at thighs clotth Standing HS curl        SLR     No weight 12x SS   NMR SLR x10 EM                           (Initials = supervised exercise by clinician)     Home Exercise Program:  Access Code: [email or web address]  URL: [email or web address]  Date: 11/02/2020  Prepared by: Beatris Ship    Exercises  Step Up - 2 x daily - 7 x weekly - 1 sets - 10 reps  Lateral Step Up - 2 x daily -  7 x weekly - 1 sets - 10 reps  Single Leg Balance with Clock Reach - 2 x daily - 7 x weekly - 1 sets - 10 reps  Mini Squat with Counter Support - 2 x daily - 7 x weekly - 1 sets - 10 reps  Tandem Stance - 2 x daily - 7 x weekly - 1 sets - 10 reps      THERAPEUTIC EXERCISE (To improve or develop Flexibility/ROM, Strength and endurance)   Warm-up-  Subjective obtained to assist with planning today's visit  Modifications/Patient Education: Progressed exercises and HEP per exercise flow sheet Cuing (Verbal, Visual and Tactile cues)  - See below    MANUAL THERAPY (To improve joint mobility, soft tissue mobility, and reduce trigger points)-Items denoted "MT" in flowsheet below  -Seated: TF distraction grade III c PROM knee flexion, STM hamstring  -Rock cupping: Quad: TKE/mini squats Not felt with: Standing knee flexion c and sans ankle weight; L Lateral distal HS and Calf: HS curl standing   -Half  kneel-L Foot FW: c manual OP to semimembranosus/tendinosis of L medial HS into and out of knee flexion      NEUROMUSCULAR RE-ED (For activation and/or inhibition of target muscle,- for movement, balance, coordination, kinesthetic sense, posture, and/ or proprioception for sitting and/ or standing activities-Items denoted "NMR" in flowsheet below  - TCs for quad activation with exercises per flow sheet   - Vcs for knee extension with backward walking    THERAPEUTIC ACTIVITY (For improvement in the completion of ADL's and functional activities.  Dynamic activities to improve functional performance, direct (one-on-one) with the patient  -Assessed and evaluated floor transfers and various stretches     MODALITIES      ASSESSMENT (response to treatment):  Veronda returns to 9th visit with overall improvement in function, but reports challenge to bend knee. Unable to perform knee flexion due to pain in the posterior aspect of knee. Pt limitations may be related to continued inflammation that is present. Reported pain relief with HS mobilization in half kneeling position, though ROM had no change. Greatest change noted after performing cupping to HS.       PLAN:  Continued skilled PT required for improving knee ROM, strength and stability required for ADLs and caring for young grandchildren.     Next:   -Assess stairs.   -Assess hip extension MMT    Pt to have R vascular procedures for veins-for cosmetic issues in September.     MD follow up 11/29/20  Objective                         ---      Flowsheet Row ---   Total Time    Timed Minutes 43 minutes   Total Time 43 minutes                 Goals      Goal 1: Pt will improve L knee flexion to 120 deg in order to improve stair negotiation with 1 HR, with reciprocal gait, and with p! 2/10 or less.   Progress-similar ROM-110 deg after manual, improvement in function, 11/17/20, SS   Sessions: 22      Goal 2: Pt will improve knee flexion strength by 0.5 MMT in order to improve  ability to negotiate stairs.     Progress-improvement in MMT from 4 to 4+, continued functional challenge, though improved 11/17/20 SS   Sessions: 22  Goal 3: Pt will improve hip extension strength by 0.5 MMT in order to improve ability to perform floor trransfers with improved speed and with p! 2/10 or less in order to improve safety and care for grandchildren.    Progress from 3+ to 4+, improved functional goal, still challenging 11/02/20 SS   Sessions: 22      Goal 4: Patient will demonstrate independence in prescribed HEP with proper form, sets and reps for safe discharge to an independent program.    Progress 11/17/20 SS   Sessions: 22          Goal 5: Pt will improve B SLS B to 10s in order to improve safety with ambulation.    Goal met -improved from 4s to 21s7/27/22 SS   Sessions: 22                           Dale Durham, DPT

## 2020-11-29 ENCOUNTER — Other Ambulatory Visit: Payer: Self-pay | Admitting: Orthopaedic Surgery

## 2020-11-29 DIAGNOSIS — Z96652 Presence of left artificial knee joint: Secondary | ICD-10-CM

## 2020-11-29 DIAGNOSIS — M25562 Pain in left knee: Secondary | ICD-10-CM

## 2020-12-01 ENCOUNTER — Ambulatory Visit
Admission: RE | Admit: 2020-12-01 | Discharge: 2020-12-01 | Disposition: A | Discharge status: Home / Self Care | Payer: No Typology Code available for payment source | Source: Ambulatory Visit | Attending: Orthopaedic Surgery | Admitting: Orthopaedic Surgery

## 2020-12-01 ENCOUNTER — Inpatient Hospital Stay: Payer: No Typology Code available for payment source

## 2020-12-01 DIAGNOSIS — R936 Abnormal findings on diagnostic imaging of limbs: Secondary | ICD-10-CM | POA: Insufficient documentation

## 2020-12-01 DIAGNOSIS — M25562 Pain in left knee: Secondary | ICD-10-CM | POA: Insufficient documentation

## 2020-12-01 DIAGNOSIS — Z96652 Presence of left artificial knee joint: Secondary | ICD-10-CM

## 2020-12-08 ENCOUNTER — Inpatient Hospital Stay: Payer: No Typology Code available for payment source | Attending: Orthopaedic Surgery

## 2020-12-08 ENCOUNTER — Telehealth: Payer: Self-pay

## 2020-12-08 DIAGNOSIS — M25662 Stiffness of left knee, not elsewhere classified: Secondary | ICD-10-CM | POA: Insufficient documentation

## 2020-12-08 DIAGNOSIS — M25562 Pain in left knee: Secondary | ICD-10-CM | POA: Insufficient documentation

## 2020-12-08 NOTE — PT/OT Therapy Note (Signed)
Name: Wendy [name] Age: 65 y.o.   Date of Service: 12/08/2020  Referring Physician: Abe People, MD   Date of Injury: 08/03/2020  Date Care Plan Established/Reviewed: 10/06/2020  Date Treatment Started: 10/06/2020  End of Certification Date: 01/03/2021  Sessions in Plan of Care: 22  Surgery Date: 08/03/2020  MD Follow-up: No data was found  Medbridge Code: No data was found    Visit Count: 10   Diagnosis:   1. Left knee pain, unspecified chronicity    2. Knee stiffness, left        Subjective     Social Support/Occupation                   Precautions: No data was found  Allergies: Iodine, Penicillins, Shellfish-derived products, Atorvastatin, Morphine, Tetanus immune globulin, and Zocor [simvastatin]  Primary complaint/Involved Side: L TKA    SUBJECTIVE:  Deira reports difficulty still descending stairs.  Joined [employer] would like to try their water aerobics class    11/02/20 Functional Limitations (PLOF):  See POC Lifting heavy objects is painful and difficult; Stairs: descending>ascending knee pain with reciprocal gait, slow, use of 1 HR; Walking without shoes on is painful-not as bad, but still tries not to; Floor transfers-very slow, but not painful-shifts to the R to get back up, or uses pillows to cushion the L.      OBJECTIVE:     Initial Evaluation Reference and/or Current Measurements(as dated):     Outcome Measure:  OUTCOMES  IE  11/24/20    FOTO 45 61    Pain         Balance:  SLS R: Eyes Open (EO): 4s.; 11/17/20: 21s  SLS L: Eyes Open (EO): 4s   11/17/20: 23s     Girth/Edema: L LE swollen at ankle and knee    Initial R Initial L 11/17/20  R, L    Mid Patellar 42 cm 46 cm 40.3, 45.2 cm   6" Above patella 55.7 cm 54.6 cm 55, 53.3   6" below patella 36.2 cm 38.6 cm 35.6, 35 cm   (blank fields were intentionally left blank)     Range of Motion: (degrees)  Initial R A/PROM Knee Initial L A/PROM 10/12/20 L A/PROM 10/28/20 L AROM 11/02/20 L AROM 11/17/20 L AROM 11/24/20 L A.PROM   129 Flexion 97 p! 95/100 >>post tx 100/110  Post tx - 100 100-post tx 100 95/100-110 post tx  100/106 p!! > post tx 107   0 Extension Lacking 3/ 0 p! Lacking 3/0       (blank fields were intentionally left blank)        Initial  Uninvolved LE Strength  MMT /5 Initial R  Taken in Supine/prone 11/02/20 L 11/17/20 L    5 (10/07/20) Hip Flexion 5 5      Glute max NT p! c knee flexion     3+ Hip Extension 3+ 4+      Hip Abduction 4 4+      Hip Adduction        4+ Quadriceps 4 4+ discomfort 4+    4 Hamstrings 3- p!! 4- p! In popliteal 4+      Ankle Dorsiflexion         Ankle Plantarflexion       (blank fields were intentionally left blank)     Special tests IE L, R   SLR Extensor lag, In tact  TREATMENT:     Exercise Flow Sheet     Exercise Specifics 10/07/20  10/12/20 10/20/20 10/21/20 10/28/20 11/02/20 11/17/20 11/24/20 12/08/20   RC Rocking Full revolutions further back Lv 1 5' SS    RC 5' rocking to full revolutions EM Knee flexion AAROM 2.5', KP Seated knee flexion with OP, 5x with hold as tolerated, KP SS   KG   SAQ 3#  12x SS NMR Supine SS NMR  NMR SAQ 3# 10x5" EM LAQ 3# 10x 5, KP  NMR Wedge stretch, KP 2x30s SS      Bridge    2x10 SS 10x SS   - Leg press 70#, 2x10, KP Leg press - NV 70# >90 # 12x each SS  DL leg press 04#, SL 54#  2x12 SS  DL Leg press 09# c increased knee flexion angle 2x15 SS  90# bilat  45#  2x10  KG   LAR R Pball   15x SS   Mini squat 2x10 EM Lateral step up green to blue 2x fatigue, KP L Forward step up/down   Green to SLS, fatigue, KP L FW step up 4" C pelvic neutral cues SS NMR L FW Step up with eccentric lowering cues SS NMR L FW step down 4" c  6" FW and backwards  10x  KG      Side step    NV  Resisted FW/BW, Lateral step 10# 4x each c close SPV SS GT Resisted FW/BW, lat 10# 5x EM Lateral step over hurdles 10x 3 hurdles, KP R Lateral step up/down, green step, fatigue, KP R Lateral step up/down, green step, fatigue, SS NMR  Bosu -black stand 30s and mini squats 3x5 SS NMR BOSU ball step up and hold with L 10x  KG   Leg press      NV   70#  2x10 SS  Leg press 70# with stretch for knee flex 2x10 EM Backward walking at barre 5x, KP  NMR Side step band - NV RTB at thighs 2x20' SS  Step up FW bosu blue c UE assist x1 and SPV 10x each SS NMR    Step up    Blue 10x each     Step up blue x10, step down green x10 EM Mini squat, KP Mini squat - NV Mini Squat 2x10 SS   Floor transfers SS TA Stairs in stairway   Up/down reciprocally  1 flight  KG      Heel raise  c ball at ankles 15x SS       3 way cone tap 2x5 R and L, KP 3 way cone tap 2x5 R and L,   Knee ext   5 sec hold into green tband  10x  KG      Hurdles    NV       Standing HS curl RTB at thighs clotth Standing HS curl         SLR     No weight 12x SS   NMR SLR x10 EM                             (Initials = supervised exercise by clinician)     Home Exercise Program:  Access Code: [email or web address]  URL: [email or web address]  Date: 11/02/2020  Prepared by: Beatris Ship    Exercises  Step Up - 2 x daily - 7 x weekly - 1 sets - 10 reps  Lateral  Step Up - 2 x daily - 7 x weekly - 1 sets - 10 reps  Single Leg Balance with Clock Reach - 2 x daily - 7 x weekly - 1 sets - 10 reps  Mini Squat with Counter Support - 2 x daily - 7 x weekly - 1 sets - 10 reps  Tandem Stance - 2 x daily - 7 x weekly - 1 sets - 10 reps      THERAPEUTIC EXERCISE (To improve or develop Flexibility/ROM, Strength and endurance)   Warm-up-  Subjective obtained to assist with planning today's visit  Modifications/Patient Education: Progressed exercises and HEP per exercise flow sheet Cuing (Verbal, Visual and Tactile cues)  - See below    MANUAL THERAPY (To improve joint mobility, soft tissue mobility, and reduce trigger points)-Items denoted "MT" in flowsheet below  None today due to time    NEUROMUSCULAR RE-ED (For activation and/or inhibition of target muscle,- for movement, balance, coordination, kinesthetic sense, posture, and/ or proprioception for sitting and/ or standing activities-Items denoted "NMR" in flowsheet  below      THERAPEUTIC ACTIVITY (For improvement in the completion of ADL's and functional activities.  Dynamic activities to improve functional performance, direct (one-on-one) with the patient      MODALITIES      ASSESSMENT (response to treatment):  Pt with improved confidence in her ability to descend stairs with practice today in stairwell and with 6" blue step.      PLAN:  Continued skilled PT 1x/week for next 3-4 weeks to continue to work on quad strengthening for descending stairs.  rNext:   -Assess stairs.   -Assess hip extension MMT    Pt to have R vascular procedures for veins-for cosmetic issues in September.     MD follow up 11/29/20  Objective                         ---      Flowsheet Row ---   Total Time    Timed Minutes 38 minutes   Total Time 38 minutes                 Goals      Goal 1: Pt will improve L knee flexion to 120 deg in order to improve stair negotiation with 1 HR, with reciprocal gait, and with p! 2/10 or less.   Progress-similar ROM-110 deg after manual, improvement in function, 11/17/20, SS   Sessions: 22      Goal 2: Pt will improve knee flexion strength by 0.5 MMT in order to improve ability to negotiate stairs.     Progress-improvement in MMT from 4 to 4+, continued functional challenge, though improved 11/17/20 SS   Sessions: 22      Goal 3: Pt will improve hip extension strength by 0.5 MMT in order to improve ability to perform floor trransfers with improved speed and with p! 2/10 or less in order to improve safety and care for grandchildren.    Progress from 3+ to 4+, improved functional goal, still challenging 11/02/20 SS   Sessions: 22      Goal 4: Patient will demonstrate independence in prescribed HEP with proper form, sets and reps for safe discharge to an independent program.    Progress 11/17/20 SS   Sessions: 22          Goal 5: Pt will improve B SLS B to 10s in order to improve safety with ambulation.  Goal met -improved from 4s to 21s7/27/22 SS   Sessions: [address] [place]

## 2020-12-08 NOTE — PT/OT Therapy Note (Signed)
Name: Wendy [name] Age: 65 y.o.   Date of Service: 12/08/2020  Referring Physician: Abe People, MD   Date of Injury: 08/03/2020  Date Care Plan Established/Reviewed: 10/06/2020  Date Treatment Started: 10/06/2020  End of Certification Date: 01/03/2021  Sessions in Plan of Care: 22  Surgery Date: 08/03/2020  MD Follow-up: No data was found  Medbridge Code: No data was found    Visit Count: 10   Diagnosis:   1. Left knee pain, unspecified chronicity    2. Knee stiffness, left        Subjective     Social Support/Occupation                   Precautions: No data was found  Allergies: Iodine, Penicillins, Shellfish-derived products, Atorvastatin, Morphine, Tetanus immune globulin, and Zocor [simvastatin]  Primary complaint/Involved Side: L TKA    SUBJECTIVE:  Nishika with reports of feeling better. Reports can go up and down the stairs easier, but still slow. Joined a gym today. Reports stopping icing because seemed like it was making her knee worse. Feels like it is better after stopping that. Still cannot bend my knee. Feels like it is something on the back (points to medial HS). Does not get onto floor because of pain.     11/02/20 Functional Limitations (PLOF):  See POC Lifting heavy objects is painful and difficult; Stairs: descending>ascending knee pain with reciprocal gait, slow, use of 1 HR; Walking without shoes on is painful-not as bad, but still tries not to; Floor transfers-very slow, but not painful-shifts to the R to get back up, or uses pillows to cushion the L.      OBJECTIVE:     Initial Evaluation Reference and/or Current Measurements(as dated):     Outcome Measure:  OUTCOMES  IE  11/24/20    FOTO 45 61    Pain         Balance:  SLS R: Eyes Open (EO): 4s.; 11/17/20: 21s  SLS L: Eyes Open (EO): 4s   11/17/20: 23s     Girth/Edema: L LE swollen at ankle and knee    Initial R Initial L 11/17/20  R, L    Mid Patellar 42 cm 46 cm 40.3, 45.2 cm   6" Above patella 55.7 cm 54.6 cm 55, 53.3   6" below patella 36.2 cm 38.6  cm 35.6, 35 cm   (blank fields were intentionally left blank)     Range of Motion: (degrees)  Initial R A/PROM Knee Initial L A/PROM 10/12/20 L A/PROM 10/28/20 L AROM 11/02/20 L AROM 11/17/20 L AROM 11/24/20 L A.PROM   129 Flexion 97 p! 95/100 >>post tx 100/110 Post tx - 100 100-post tx 100 95/100-110 post tx  100/106 p!! > post tx 107   0 Extension Lacking 3/ 0 p! Lacking 3/0       (blank fields were intentionally left blank)        Initial  Uninvolved LE Strength  MMT /5 Initial R  Taken in Supine/prone 11/02/20 L 11/17/20 L    5 (10/07/20) Hip Flexion 5 5      Glute max NT p! c knee flexion     3+ Hip Extension 3+ 4+      Hip Abduction 4 4+      Hip Adduction        4+ Quadriceps 4 4+ discomfort 4+    4 Hamstrings 3- p!! 4- p! In popliteal 4+  Ankle Dorsiflexion         Ankle Plantarflexion       (blank fields were intentionally left blank)     Special tests IE L, R   SLR Extensor lag, In tact              TREATMENT:     Exercise Flow Sheet     Exercise Specifics 10/21/20 10/28/20 11/02/20 11/17/20 11/24/20 12/08/20   RC Rocking Knee flexion AAROM 2.5', KP Seated knee flexion with OP, 5x with hold as tolerated, KP SS      SAQ 3#  LAQ 3# 10x 5, KP  NMR Wedge stretch, KP 2x30s SS      Bridge   Leg press 70#, 2x10, KP Leg press - NV 70# >90 # 12x each SS  DL leg press 16#, SL 10#  2x12 SS  DL Leg press 96# c increased knee flexion angle 2x15 SS     LAR R Pball  Lateral step up green to blue 2x fatigue, KP L Forward step up/down   Green to SLS, fatigue, KP L FW step up 4" C pelvic neutral cues SS NMR L FW Step up with eccentric lowering cues SS NMR L FW step down 4" c        Side step   Lateral step over hurdles 10x 3 hurdles, KP R Lateral step up/down, green step, fatigue, KP R Lateral step up/down, green step, fatigue, SS NMR  Bosu -black stand 30s and mini squats 3x5 SS NMR    Leg press      Backward walking at barre 5x, KP  NMR Side step band - NV RTB at thighs 2x20' SS  Step up FW bosu blue c UE assist x1 and SPV 10x each SS  NMR    Step up    Blue Mini squat, KP Mini squat - NV Mini Squat 2x10 SS   Floor transfers SS TA       Heel raise  c ball at ankles  3 way cone tap 2x5 R and L, KP 3 way cone tap 2x5 R and L,         Hurdles      Standing HS curl RTB at thighs clotth Standing HS curl         SLR                          (Initials = supervised exercise by clinician)     Home Exercise Program:  Access Code: [email or web address]  URL: [email or web address]  Date: 11/02/2020  Prepared by: Beatris Ship    Exercises  Step Up - 2 x daily - 7 x weekly - 1 sets - 10 reps  Lateral Step Up - 2 x daily - 7 x weekly - 1 sets - 10 reps  Single Leg Balance with Clock Reach - 2 x daily - 7 x weekly - 1 sets - 10 reps  Mini Squat with Counter Support - 2 x daily - 7 x weekly - 1 sets - 10 reps  Tandem Stance - 2 x daily - 7 x weekly - 1 sets - 10 reps      THERAPEUTIC EXERCISE (To improve or develop Flexibility/ROM, Strength and endurance)   Warm-up-  Subjective obtained to assist with planning today's visit  Modifications/Patient Education: Progressed exercises and HEP per exercise flow sheet Cuing (Verbal, Visual and Tactile cues)  - See below    MANUAL THERAPY (To  improve joint mobility, soft tissue mobility, and reduce trigger points)-Items denoted "MT" in flowsheet below  -Seated: TF distraction grade III c PROM knee flexion, STM hamstring  -Rock cupping: Quad: TKE/mini squats Not felt with: Standing knee flexion c and sans ankle weight; L Lateral distal HS and Calf: HS curl standing   -Half kneel-L Foot FW: c manual OP to semimembranosus/tendinosis of L medial HS into and out of knee flexion      NEUROMUSCULAR RE-ED (For activation and/or inhibition of target muscle,- for movement, balance, coordination, kinesthetic sense, posture, and/ or proprioception for sitting and/ or standing activities-Items denoted "NMR" in flowsheet below  - TCs for quad activation with exercises per flow sheet   - Vcs for knee extension with backward  walking    THERAPEUTIC ACTIVITY (For improvement in the completion of ADL's and functional activities.  Dynamic activities to improve functional performance, direct (one-on-one) with the patient  -Assessed and evaluated floor transfers and various stretches     MODALITIES      ASSESSMENT (response to treatment):  Zemirah returns to 9th visit with overall improvement in function, but reports challenge to bend knee. Unable to perform knee flexion due to pain in the posterior aspect of knee. Pt limitations may be related to continued inflammation that is present. Reported pain relief with HS mobilization in half kneeling position, though ROM had no change. Greatest change noted after performing cupping to HS.       PLAN:  Continued skilled PT required for improving knee ROM, strength and stability required for ADLs and caring for young grandchildren.     Next:   -Assess stairs.   -Assess hip extension MMT    Pt to have R vascular procedures for veins-for cosmetic issues in September.     MD follow up 11/29/20  Objective                                    Goals      Goal 1: Pt will improve L knee flexion to 120 deg in order to improve stair negotiation with 1 HR, with reciprocal gait, and with p! 2/10 or less.   Progress-similar ROM-110 deg after manual, improvement in function, 11/17/20, SS   Sessions: 22      Goal 2: Pt will improve knee flexion strength by 0.5 MMT in order to improve ability to negotiate stairs.     Progress-improvement in MMT from 4 to 4+, continued functional challenge, though improved 11/17/20 SS   Sessions: 22      Goal 3: Pt will improve hip extension strength by 0.5 MMT in order to improve ability to perform floor trransfers with improved speed and with p! 2/10 or less in order to improve safety and care for grandchildren.    Progress from 3+ to 4+, improved functional goal, still challenging 11/02/20 SS   Sessions: 22      Goal 4: Patient will demonstrate independence in prescribed HEP with proper  form, sets and reps for safe discharge to an independent program.    Progress 11/17/20 SS   Sessions: 22          Goal 5: Pt will improve B SLS B to 10s in order to improve safety with ambulation.    Goal met -improved from 4s to 21s7/27/22 SS   Sessions: 22  Encarnacion Slates, DPT

## 2020-12-12 ENCOUNTER — Encounter (INDEPENDENT_AMBULATORY_CARE_PROVIDER_SITE_OTHER): Payer: Self-pay | Admitting: Internal Medicine

## 2020-12-13 ENCOUNTER — Telehealth (INDEPENDENT_AMBULATORY_CARE_PROVIDER_SITE_OTHER)
Payer: No Typology Code available for payment source | Admitting: Student in an Organized Health Care Education/Training Program

## 2020-12-13 ENCOUNTER — Encounter (INDEPENDENT_AMBULATORY_CARE_PROVIDER_SITE_OTHER): Payer: Self-pay | Admitting: Student in an Organized Health Care Education/Training Program

## 2020-12-13 DIAGNOSIS — U071 COVID-19: Secondary | ICD-10-CM

## 2020-12-13 MED ORDER — NIRMATRELVIR&RITONAVIR 300/100 20 X 150 MG & 10 X 100MG PO TBPK
3.0000 | ORAL_TABLET | Freq: Two times a day (BID) | ORAL | 0 refills | Status: AC
Start: 2020-12-13 — End: 2020-12-18

## 2020-12-13 NOTE — Progress Notes (Signed)
Have you seen any specialists/other providers since your last visit with [place]?    No    Arm preference verified?   No    The patient is due for  tetanus, covid booster, pap smear, influenza.

## 2020-12-13 NOTE — Progress Notes (Signed)
Chief Complaint   Patient presents with    Cough     Productive cough, runny nose, Pt report tested  positive  for covid at home on 0819/2022           Verbal consent has been obtained from the patient to conduct a video and telephone visit to minimize exposure to COVID-19: Yes     A two-way, synchronous, real- time, audio and visual interactive communication system between the patient and myself was utilized.      Time Spent: 21 min.     Video used: Vidyo /doxy ,Pt is in [place]        Physical examination is limited to visual inspection with patient's input on video visit.         CC:  Wendy [name] is a 65 y.o. female Hx HTN, HLD, migraine  who presents for evaluation of  Above problem   Patient Reports to  5 days  Symptoms of sore throat , runny nose, nasal congestion and productive cough   No fevers or chills, no Travel,  No sick contact,  Took  OTC with mild- moderate relief  She had covid vac x 3   She tested positive for covid     PCP : dr Konrad Dolores     Past Medical History:   Diagnosis Date    Brain tumor (benign)     Hypercholesteremia     Hypertensive disorder       Past Surgical History:   Procedure Laterality Date    ARTHROPLASTY, KNEE, TOTAL MAKOPLASTY Left 08/03/2020    Procedure: ARTHROPLASTY, TOTAL, KNEE MAKOPLASTY;  Surgeon: Abe People, MD;  Location: [facility];  Service: Orthopedics;  Laterality: Left;    BRAIN SURGERY  2009    BREAST BIOPSY Right 2008    right breast biopsy- benign    BREAST BIOPSY Right 1970s    right breast tumor removed 40+ years ago - benign    BREAST CYST EXCISION  1970s    right breast tumor removed 40+ years ago - benign    CESAREAN SECTION      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    DOCUSATE SODIUM (COLACE) 100 MG CAPSULE    Take 1 capsule (100 mg total) by mouth 2 (two) times daily    ERGOCALCIFEROL (DRISDOL) 1.25 MG (50000 UT) CAPSULE    Take 1 capsule (50,000 Units total) by mouth once a week    FLUTICASONE (FLONASE) 50 MCG/ACT NASAL SPRAY    1 spray by Nasal route as needed for Rhinitis    IBUPROFEN (ADVIL) 600 MG TABLET    Take 1 tablet (600 mg total) by mouth every 6 (six) hours as needed for Pain    LISINOPRIL (ZESTRIL) 20 MG TABLET    Take 1 tablet (20 mg total) by mouth daily    MULTIPLE VITAMIN (MULTIVITAMIN) TABLET  Take 1 tablet by mouth daily.    NORTRIPTYLINE (PAMELOR) 10 MG CAPSULE    Take 1 capsule (10 mg total) by mouth as needed (sleep)    PREDNISOLONE ACETATE (PRED FORTE) 1 % OPHTHALMIC SUSPENSION    Place 1 drop into the left eye 4 (four) times daily    ROSUVASTATIN (CRESTOR) 20 MG TABLET    1 tab qod    TRAMADOL (ULTRAM) 50 MG TABLET    Take 1 tablet (50 mg total) by mouth every 12 (twelve) hours    TRIAMCINOLONE (KENALOG) 0.025 % OINTMENT    Apply topically 2 (two) times daily    TRIAMTERENE-HYDROCHLOROTHIAZIDE (MAXZIDE-25) 37.5-25 MG PER TABLET    Take 1 tablet by mouth daily     Allergies   Allergen Reactions    Iodine Shortness Of Breath    Penicillins Shortness Of Breath     Has patient had a PCN reaction causing immediate rash, facial/tongue/throat swelling, SOB or lightheadedness with hypotension: Yes  Has patient had a PCN reaction causing severe rash involving mucus membranes or skin necrosis: No  Has patient had a PCN reaction that required hospitalization: No  Has patient had a PCN reaction occurring within the last 10 years: Yes  If all of the above answers are "NO", then may proceed with Cephalosporin use.    Shellfish-Derived Products Anaphylaxis    Atorvastatin      Joint pain     Morphine      Itchy     Tetanus Immune Globulin      Arm swelling     Zocor  [Simvastatin]      No response       Social History     Tobacco Use    Smoking status: Never    Smokeless tobacco: Never   Substance Use Topics    Alcohol use: Yes     Alcohol/week: 1.0 - 2.0 standard drink     Types: 1 - 2 Shots of liquor per week      Family History   Problem Relation Age of Onset    Diabetes Mother     Hypertension Mother     Diabetes Father     Hypertension Father     Breast cancer Neg Hx         Review of Systems - General ROS: negative for - chills, fatigue, fever or malaise  Ophthalmic ROS: negative for - excessive tearing or itchy eyes  ENT ROS: positive for - headaches, nasal congestion, and sore throat  negative for - tinnitus, vertigo or visual changes  Allergy and Immunology ROS: negative for - itchy/watery eyes, postnasal drip or seasonal allergies  Respiratory ROS: positive for - cough and sputum changes  negative for - shortness of breath or wheezing  Cardiovascular ROS: no chest pain or dyspnea on exertion    General Appearance:  Alert, cooperative, no distress, appears stated age   Head: Normocephalic, without obvious abnormality, atraumatic   Eyes:  Sclera anicteric, conjunctiva without pallor   Lungs:   Breathing comfortably without accessory muscle use. Normal respiratory effort.    Musculoskeletal: No apparent gait or station abnormalities    Skin: Normal skin color, texture, no visible rashes or lesions   Extremities: No apparent edema   Neurologic: Alert and oriented x3, normal mood and affect. Grossly intact           A/P  1. COVID-19  nirmatrelvir-ritonavir (PAXLOVID) 20 x 150 MG & 10 x 100MG  dose pack(emergency use authorization)  Home Care:    Good hydration   Tylenol prn   Paxlovid   Stop crestor while taking paxlovid , resume after 5 days   Watch BP while on paxlovid       Potential medication side effects were discussed with the patient; let me know if any occur.      > 21 min spent withPt , > 50%  were spent in counseling and care coordination, and discussing  differential

## 2020-12-21 ENCOUNTER — Inpatient Hospital Stay: Payer: No Typology Code available for payment source | Attending: Orthopaedic Surgery

## 2020-12-21 DIAGNOSIS — M25662 Stiffness of left knee, not elsewhere classified: Secondary | ICD-10-CM | POA: Insufficient documentation

## 2020-12-21 DIAGNOSIS — M25562 Pain in left knee: Secondary | ICD-10-CM | POA: Insufficient documentation

## 2020-12-21 NOTE — PT/OT Therapy Note (Signed)
Name: Wendy [name] Age: 65 y.o.   Date of Service: 12/21/2020  Referring Physician: Abe People, MD   Date of Injury: 08/03/2020  Date Care Plan Established/Reviewed: 10/06/2020  Date Treatment Started: 10/06/2020  End of Certification Date: 01/03/2021  Sessions in Plan of Care: 22  Surgery Date: 08/03/2020  MD Follow-up: No data was found  Medbridge Code: No data was found    Visit Count: 11   Diagnosis:   1. Left knee pain, unspecified chronicity    2. Knee stiffness, left        Subjective     Social Support/Occupation                   Precautions: No data was found  Allergies: Iodine, Penicillins, Shellfish-derived products, Atorvastatin, Morphine, Tetanus immune globulin, and Zocor [simvastatin]  Primary complaint/Involved Side: L TKA    SUBJECTIVE:  Wendy [name] reports having COVID a few weeks ago. Reports no pain with stairs, but still takes slow. Down harder than up.     11/02/20 Functional Limitations (PLOF):  See POC Lifting heavy objects is painful and difficult; Stairs: descending>ascending knee pain with reciprocal gait, slow, use of 1 HR; Walking without shoes on is painful-not as bad, but still tries not to; Floor transfers-very slow, but not painful-shifts to the R to get back up, or uses pillows to cushion the L.      OBJECTIVE:     Initial Evaluation Reference and/or Current Measurements(as dated):     Outcome Measure:  OUTCOMES  IE  11/24/20  12/21/20   FOTO 45 61     Pain          Balance:  SLS R: Eyes Open (EO): 4s.; 11/17/20: 21s; 12/21/20:   SLS L: Eyes Open (EO): 4s   11/17/20: 23s; 12/21/20:      Girth/Edema: L LE swollen at ankle and knee    Initial R Initial L 11/17/20  R, L    Mid Patellar 42 cm 46 cm 40.3, 45.2 cm   6" Above patella 55.7 cm 54.6 cm 55, 53.3   6" below patella 36.2 cm 38.6 cm 35.6, 35 cm   (blank fields were intentionally left blank)     Range of Motion: (degrees)  Initial R A/PROM Knee Initial L A/PROM 10/12/20 L A/PROM 10/28/20 L AROM 11/02/20 L AROM 11/17/20 L AROM 11/24/20 L A.PROM 12/21/20 L  A/PROM   129 Flexion 97 p! 95/100 >>post tx 100/110 Post tx - 100 100-post tx 100 95/100-110 post tx  100/106 p!! > post tx 107 110   0 Extension Lacking 3/ 0 p! Lacking 3/0        (blank fields were intentionally left blank)        Initial  Uninvolved LE Strength  MMT /5 Initial R  Taken in Supine/prone 11/02/20 L 11/17/20 L 12/21/20 L    5 (10/07/20) Hip Flexion 5 5       Glute max NT p! c knee flexion   2+ B   3+ Hip Extension 3+ 4+       Hip Abduction 4 4+       Hip Adduction         4+ Quadriceps 4 4+ discomfort 4+ 5-    4 Hamstrings 3- p!! 4- p! In popliteal 4+  5-     Ankle Dorsiflexion          Ankle Plantarflexion        (blank fields were intentionally  left blank)     TREATMENT:     Exercise Flow Sheet     Exercise Specifics 10/21/20 10/28/20 11/02/20 11/17/20 11/24/20 12/08/20 12/21/20   RC Rocking Knee flexion AAROM 2.5', KP Seated knee flexion with OP, 5x with hold as tolerated, KP SS   KG Prone toe down with knee straight + glute activation 10x SS NMR   SAQ 3#  LAQ 3# 10x 5, KP  NMR Wedge stretch, KP 2x30s SS    Rec Fem Stretch 2x60s    Bridge   Leg press 70#, 2x10, KP Leg press - NV 70# >90 # 12x each SS  DL leg press 08#, SL 65#  2x12 SS  DL Leg press 78# c increased knee flexion angle 2x15 SS  90# bilat  45#  2x10  KG 90# B 10x; 50# 2x10 SS    LAR R Pball  Lateral step up green to blue 2x fatigue, KP L Forward step up/down   Green to SLS, fatigue, KP L FW step up 4" C pelvic neutral cues SS NMR L FW Step up with eccentric lowering cues SS NMR L FW step down 4" c  6" FW and backwards  10x  KG 6" step up and over FW/BW both sides- cue glutes  2x10 SS NMR      Side step   Lateral step over hurdles 10x 3 hurdles, KP R Lateral step up/down, green step, fatigue, KP R Lateral step up/down, green step, fatigue, SS NMR  Bosu -black stand 30s and mini squats 3x5 SS NMR BOSU ball step up and hold with L 10x  KG    Leg press      Backward walking at barre 5x, KP  NMR Side step band - NV RTB at thighs 2x20' SS  Step up FW  bosu blue c UE assist x1 and SPV 10x each SS NMR  Sustained knee flexion stretch prone c strap 5' SS    Step up    Blue Mini squat, KP Mini squat - NV Mini Squat 2x10 SS   Floor transfers SS TA Stairs in stairway   Up/down reciprocally  1 flight  KG       Heel raise  c ball at ankles  3 way cone tap 2x5 R and L, KP 3 way cone tap 2x5 R and L,   Knee ext   5 sec hold into green tband  10x  KG       Hurdles      Standing HS curl RTB at thighs clotth Standing HS curl   Standing HS curl YTB cloth 2x10 SS NMR       SLR                            (Initials = supervised exercise by clinician)     Home Exercise Program:  Access Code: [email or web address]  URL: [email or web address]  Date: 12/21/2020  Prepared by: Beatris Ship    Exercises  Step Up - 2 x daily - 7 x weekly - 1 sets - 10 reps  Lateral Step Up - 2 x daily - 7 x weekly - 1 sets - 10 reps  Single Leg Balance with Clock Reach - 2 x daily - 7 x weekly - 1 sets - 10 reps  Mini Squat with Counter Support - 2 x daily - 7 x weekly - 1 sets - 10 reps  Tandem Stance - 2 x daily -  7 x weekly - 1 sets - 10 reps  Standing Hamstring Curl with Resistance - 1 x daily - 4-5 x weekly - 2 sets - 10 reps  Prone Quadriceps Stretch with Strap - 1 x daily - 7 x weekly - 1 sets - 1 reps - 5 minutes hold  Standing Quad Stretch with Table and Chair Support - 2 x daily - 7 x weekly - 1 sets - 2 reps - 60s hold    THERAPEUTIC EXERCISE (To improve or develop Flexibility/ROM, Strength and endurance)   Warm-up-  Subjective obtained to assist with planning today's visit  Modifications/Patient Education: Progressed exercises and HEP per exercise flow sheet Cuing (Verbal, Visual and Tactile cues)  - See below    MANUAL THERAPY (To improve joint mobility, soft tissue mobility, and reduce trigger points)-Items denoted "MT" in flowsheet below  Supine: Tibia on femur AP glide for knee flexion and capsule stretch    NEUROMUSCULAR RE-ED (For activation and/or inhibition of target muscle,- for  movement, balance, coordination, kinesthetic sense, posture, and/ or proprioception for sitting and/ or standing activities-Items denoted "NMR" in flowsheet below  -see above for glute activation cues     THERAPEUTIC ACTIVITY (For improvement in the completion of ADL's and functional activities.  Dynamic activities to improve functional performance, direct (one-on-one) with the patient  -Discussed use of prolonged stretching and importance of capsule stretch      MODALITIES      ASSESSMENT (response to treatment):  Chamille with slight improvement in knee flexion ROM and with less inflammation from prior session. Tolerated session well. Required cues for eccentric lowering with step over and max cues for glute activation.      PLAN:  Continued skilled PT 1x/week for next 3-4 weeks to continue to work on quad strengthening for descending stairs.        Pt to have R vascular procedures for veins-for cosmetic issues in September.     Objective                         ---      Flowsheet Row ---   Total Time    Timed Minutes 42 minutes   Total Time 42 minutes                 Goals      Goal 1: Pt will improve L knee flexion to 120 deg in order to improve stair negotiation with 1 HR, with reciprocal gait, and with p! 2/10 or less.     Progress-similar ROM-110 deg beginning of session 12/21/20 SS   Sessions: 22      Goal 2: Pt will improve knee flexion strength by 0.5 MMT in order to improve ability to negotiate stairs.     Progress-improvement in MMT from 4+ to 5-; improved stairs, still has to move slowly 12/21/20 SS   Sessions: 22      Goal 3: Pt will improve hip extension strength by 0.5 MMT in order to improve ability to perform floor trransfers with improved speed and with p! 2/10 or less in order to improve safety and care for grandchildren.    Regression to 2+, improved functional goal without ability to lift fully against gravity 12/21/20 SS   Sessions: 22      Goal 4: Patient will demonstrate independence in prescribed  HEP with proper form, sets and reps for safe discharge to an independent program.    Progress 12/21/20 SS  Sessions: 22          Goal 5: Pt will improve B SLS B to 10s in order to improve safety with ambulation.    Goal met -improved from 4s to 21s7/27/22 SS   Sessions: 22                           Dale Durham, DPT

## 2020-12-30 ENCOUNTER — Inpatient Hospital Stay: Payer: No Typology Code available for payment source | Admitting: Rehabilitative and Restorative Service Providers"

## 2021-01-03 ENCOUNTER — Inpatient Hospital Stay: Payer: No Typology Code available for payment source | Attending: Orthopaedic Surgery

## 2021-01-03 DIAGNOSIS — M25562 Pain in left knee: Secondary | ICD-10-CM

## 2021-01-03 DIAGNOSIS — M25662 Stiffness of left knee, not elsewhere classified: Secondary | ICD-10-CM | POA: Insufficient documentation

## 2021-01-03 NOTE — PT/OT Therapy Note (Signed)
Name: Wendy [name] Age: 65 y.o.   Date of Service: 01/03/2021  Referring Physician: Abe People, MD   Date of Injury: 08/03/2020  Date Care Plan Established/Reviewed: 10/06/2020  Date Treatment Started: 10/06/2020  End of Certification Date: 01/03/2021  Sessions in Plan of Care: 22  Surgery Date: 08/03/2020  MD Follow-up: No data was found  Medbridge Code: No data was found    Visit Count: 12   Diagnosis:   1. Left knee pain, unspecified chronicity    2. Knee stiffness, left        Subjective     Social Support/Occupation                   Precautions: No data was found  Allergies: Iodine, Penicillins, Shellfish-derived products, Atorvastatin, Morphine, Tetanus immune globulin, and Zocor [simvastatin]  Primary complaint/Involved Side: L TKA    SUBJECTIVE:  Wendy [name] reports I have been doing well, going to [employer] and feel like knee functionally(stairs, driving, I with household chores and taking care of grandson)  is doing well just concerned I can only bend the knee to 110*    11/02/20 Functional Limitations (PLOF):  See POC Lifting heavy objects is painful and difficult; Stairs: descending>ascending knee pain with reciprocal gait, slow, use of 1 HR; Walking without shoes on is painful-not as bad, but still tries not to; Floor transfers-very slow, but not painful-shifts to the R to get back up, or uses pillows to cushion the L.      OBJECTIVE:     Initial Evaluation Reference and/or Current Measurements(as dated):     Outcome Measure:  OUTCOMES  IE  11/24/20  12/21/20   FOTO 45 61     Pain          Balance:  SLS R: Eyes Open (EO): 4s.; 11/17/20: 21s; 12/21/20:   SLS L: Eyes Open (EO): 4s   11/17/20: 23s; 12/21/20:      Girth/Edema: L LE swollen at ankle and knee    Initial R Initial L 11/17/20  R, L    Mid Patellar 42 cm 46 cm 40.3, 45.2 cm   6" Above patella 55.7 cm 54.6 cm 55, 53.3   6" below patella 36.2 cm 38.6 cm 35.6, 35 cm   (blank fields were intentionally left blank)     Range of Motion: (degrees)  Initial R A/PROM Knee  Initial L A/PROM 10/12/20 L A/PROM 10/28/20 L AROM 11/02/20 L AROM 11/17/20 L AROM 11/24/20 L A.PROM 12/21/20 L A/PROM   129 Flexion 97 p! 95/100 >>post tx 100/110 Post tx - 100 100-post tx 100 95/100-110 post tx  100/106 p!! > post tx 107 110   0 Extension Lacking 3/ 0 p! Lacking 3/0        (blank fields were intentionally left blank)        Initial  Uninvolved LE Strength  MMT /5 Initial R  Taken in Supine/prone 11/02/20 L 11/17/20 L 12/21/20 L    5 (10/07/20) Hip Flexion 5 5       Glute max NT p! c knee flexion   2+ B   3+ Hip Extension 3+ 4+       Hip Abduction 4 4+       Hip Adduction         4+ Quadriceps 4 4+ discomfort 4+ 5-    4 Hamstrings 3- p!! 4- p! In popliteal 4+  5-     Ankle Dorsiflexion  Ankle Plantarflexion        (blank fields were intentionally left blank)     TREATMENT:     Exercise Flow Sheet     Exercise Specifics 10/21/20 10/28/20 11/02/20 11/17/20 11/24/20 12/08/20 12/21/20 01/03/21   RC Rocking Knee flexion AAROM 2.5', KP Seated knee flexion with OP, 5x with hold as tolerated, KP SS   KG Prone toe down with knee straight + glute activation 10x SS NMR Recumbent bike  5 min  KG   SAQ 3#  LAQ 3# 10x 5, KP  NMR Wedge stretch, KP 2x30s SS    Rec Fem Stretch 2x60s  KG   Bridge   Leg press 70#, 2x10, KP Leg press - NV 70# >90 # 12x each SS  DL leg press 16#, SL 10#  2x12 SS  DL Leg press 96# c increased knee flexion angle 2x15 SS  90# bilat  45#  2x10  KG 90# B 10x; 50# 2x10 SS  Bilat 2x10  L only 2x10  KG   LAR R Pball  Lateral step up green to blue 2x fatigue, KP L Forward step up/down   Green to SLS, fatigue, KP L FW step up 4" C pelvic neutral cues SS NMR L FW Step up with eccentric lowering cues SS NMR L FW step down 4" c  6" FW and backwards  10x  KG 6" step up and over FW/BW both sides- cue glutes  2x10 SS NMR 2x10  NMR  KG      Side step   Lateral step over hurdles 10x 3 hurdles, KP R Lateral step up/down, green step, fatigue, KP R Lateral step up/down, green step, fatigue, SS NMR  Bosu -black stand 30s  and mini squats 3x5 SS NMR BOSU ball step up and hold with L 10x  KG  10x  KG   Leg press      Backward walking at barre 5x, KP  NMR Side step band - NV RTB at thighs 2x20' SS  Step up FW bosu blue c UE assist x1 and SPV 10x each SS NMR  Sustained knee flexion stretch prone c strap 5' SS     Step up    Blue Mini squat, KP Mini squat - NV Mini Squat 2x10 SS   Floor transfers SS TA Stairs in stairway   Up/down reciprocally  1 flight  KG        Heel raise  c ball at ankles  3 way cone tap 2x5 R and L, KP 3 way cone tap 2x5 R and L,   Knee ext   5 sec hold into green tband  10x  KG        Hurdles      Standing HS curl RTB at thighs clotth Standing HS curl   Standing HS curl YTB cloth 2x10 SS NMR 2x10  NMR  K       SLR                              (Initials = supervised exercise by clinician)     Home Exercise Program:  Access Code: [email or web address]  URL: [email or web address]  Date: 12/21/2020  Prepared by: Beatris Ship    Exercises  Step Up - 2 x daily - 7 x weekly - 1 sets - 10 reps  Lateral Step Up - 2 x daily - 7 x weekly - 1 sets - 10 reps  Single Leg Balance with Clock Reach - 2 x daily - 7 x weekly - 1 sets - 10 reps  Mini Squat with Counter Support - 2 x daily - 7 x weekly - 1 sets - 10 reps  Tandem Stance - 2 x daily - 7 x weekly - 1 sets - 10 reps  Standing Hamstring Curl with Resistance - 1 x daily - 4-5 x weekly - 2 sets - 10 reps  Prone Quadriceps Stretch with Strap - 1 x daily - 7 x weekly - 1 sets - 1 reps - 5 minutes hold  Standing Quad Stretch with Table and Chair Support - 2 x daily - 7 x weekly - 1 sets - 2 reps - 60s hold    THERAPEUTIC EXERCISE (To improve or develop Flexibility/ROM, Strength and endurance)   Warm-up-  Subjective obtained to assist with planning today's visit  Modifications/Patient Education: Progressed exercises and HEP per exercise flow sheet Cuing (Verbal, Visual and Tactile cues)  - See below    MANUAL THERAPY (To improve joint mobility, soft tissue mobility, and reduce  trigger points)-Items denoted "MT" in flowsheet below  Supine: Tibia on femur AP glide for knee flexion and capsule stretch    NEUROMUSCULAR RE-ED (For activation and/or inhibition of target muscle,- for movement, balance, coordination, kinesthetic sense, posture, and/ or proprioception for sitting and/ or standing activities-Items denoted "NMR" in flowsheet below  -see above for glute activation cues     THERAPEUTIC ACTIVITY (For improvement in the completion of ADL's and functional activities.  Dynamic activities to improve functional performance, direct (one-on-one) with the patient  -Discussed use of prolonged stretching and importance of capsule stretch      MODALITIES      ASSESSMENT (response to treatment):  Jahnai was able to perform 6" step without pain,   Knee flexion continues to be limited to 110*.but pt does not have any trouble with ADLs      PLAN:  Continued skilled PT 1x/week for next 3-4 weeks to continue to work on quad strengthening for descending stairs.    Continue x2 more visits.      Pt to have R vascular procedures for veins-for cosmetic issues in September.     Objective                         ---      Flowsheet Row ---   Total Time    Timed Minutes 40 minutes   Total Time 40 minutes                 Goals      Goal 1: Pt will improve L knee flexion to 120 deg in order to improve stair negotiation with 1 HR, with reciprocal gait, and with p! 2/10 or less.     Progress-similar ROM-110 deg beginning of session 12/21/20 SS   Sessions: 22      Goal 2: Pt will improve knee flexion strength by 0.5 MMT in order to improve ability to negotiate stairs.     Progress-improvement in MMT from 4+ to 5-; improved stairs, still has to move slowly 12/21/20 SS   Sessions: 22      Goal 3: Pt will improve hip extension strength by 0.5 MMT in order to improve ability to perform floor trransfers with improved speed and with p! 2/10 or less in order to improve safety and care for grandchildren.    Regression to 2+,  improved  functional goal without ability to lift fully against gravity 12/21/20 SS   Sessions: 22      Goal 4: Patient will demonstrate independence in prescribed HEP with proper form, sets and reps for safe discharge to an independent program.    Progress 12/21/20 SS   Sessions: 22          Goal 5: Pt will improve B SLS B to 10s in order to improve safety with ambulation.    Goal met -improved from 4s to 21s7/27/22 SS   Sessions: [address] [place]

## 2021-01-06 ENCOUNTER — Inpatient Hospital Stay: Payer: No Typology Code available for payment source | Admitting: Rehabilitative and Restorative Service Providers"

## 2021-01-11 ENCOUNTER — Inpatient Hospital Stay
Payer: No Typology Code available for payment source | Attending: Orthopaedic Surgery | Admitting: Rehabilitative and Restorative Service Providers"

## 2021-01-11 DIAGNOSIS — M25662 Stiffness of left knee, not elsewhere classified: Secondary | ICD-10-CM | POA: Insufficient documentation

## 2021-01-11 DIAGNOSIS — M25562 Pain in left knee: Secondary | ICD-10-CM | POA: Insufficient documentation

## 2021-01-11 NOTE — Progress Notes (Signed)
Name:Rashel Unk Pinto Age: 65 y.o.   Date of Service: 01/11/2021  Referring Physician: Abe People, MD   Date of Injury: 08/03/2020  Date Care Plan Established/Reviewed: 10/06/2020  Date Treatment Started: 10/06/2020  Date Care Plan Established/Reviewed No data was found  Date Treatment Started No data was found  (Historic) Date Care Plan Established/Reviewed 10/06/2020  (Historic) Date Treatment Started 10/06/2020   End of Certification Date: 01/03/2021  Sessions in Plan of Care: 22  Surgery Date: 08/03/2020  MD Follow-up: No data was found  Medbridge Code: No data was found    Visit Count: 13   Diagnosis:   1. Left knee pain, unspecified chronicity    2. Knee stiffness, left        Subjective     Social Support/Occupation                   Precautions: No data was found  Allergies: Iodine, Penicillins, Shellfish-derived products, Atorvastatin, Morphine, Tetanus immune globulin, and Zocor [simvastatin]    Past Medical History:   Diagnosis Date    Brain tumor (benign)     Hypercholesteremia     Hypertensive disorder    SUBJECTIVE:  Indigo reports 80% back to prior level of function. Narrow stairs are most challenging with reciprocal gait. She is able to do wide steps but narrow are more challenging and she goes slower.     01/11/21 Functional Limitations (PLOF):  See POC Lifting with no difficulty  Stairs: descending>ascending  reciprocal gait,1 HR; Floor transfers no difficulty     OBJECTIVE:     Initial Evaluation Reference and/or Current Measurements(as dated):     Outcome Measure:  OUTCOMES  IE  11/24/20  01/11/21   FOTO 45 61  79   Pain          Balance:  SLS R: Eyes Open (EO): 4s.; 11/17/20: 21s; 12/21/20:   SLS L: Eyes Open (EO): 4s   11/17/20: 23s; 12/21/20:      Girth/Edema: L LE swollen at ankle and knee    Initial R Initial L 11/17/20  R, L    Mid Patellar 42 cm 46 cm 40.3, 45.2 cm   6" Above patella 55.7 cm 54.6 cm 55, 53.3   6" below patella 36.2 cm 38.6 cm 35.6, 35 cm   (blank fields were intentionally left blank)      Range of Motion: (degrees)  Initial R A/PROM Knee Initial L A/PROM 10/12/20 L A/PROM 10/28/20 L AROM 11/02/20 L AROM 11/17/20 L AROM 11/24/20 L A.PROM 12/21/20 L A/PROM 01/11/21  L A/PROM    129 Flexion 97 p! 95/100 >>post tx 100/110 Post tx - 100 100-post tx 100 95/100-110 post tx  100/106 p!! > post tx 107 110 110/   0 Extension Lacking 3/ 0 p! Lacking 3/0      0    (blank fields were intentionally left blank)        Initial  Uninvolved LE Strength  MMT /5 Initial R  Taken in Supine/prone 11/02/20 L 11/17/20 L 12/21/20 L 01/11/21  L     5 (10/07/20) Hip Flexion 5 5        Glute max NT p! c knee flexion   2+ B    3+ Hip Extension 3+ 4+        Hip Abduction 4 4+        Hip Adduction          4+ Quadriceps 4 4+ discomfort 4+ 5-  5    4 Hamstrings 3- p!! 4- p! In popliteal 4+  5- 5     Ankle Dorsiflexion           Ankle Plantarflexion         (blank fields were intentionally left blank)  ASSESSMENT (response to treatment):  Tova Vater has had good progress with physical therapy. She demonstrates 0-110 degrees active left knee range of motion. She has good quadriceps strength and functionally is able to perform squats and steps with limited difficulty. She was educated on continuing stretches/mobility exercises for maintaining range of motion and continuing quadriceps strengthening. She was provided comprehensive HEP and instructed to contact physical therapist with questions or concerns.      PLAN:  [place] to HEP. Goal status below.    Objective                         ---      Flowsheet Row ---   Total Time    Timed Minutes 40 minutes   Total Time 40 minutes             Goals      Goal 1: Pt will improve L knee flexion to 120 deg in order to improve stair negotiation with 1 HR, with reciprocal gait, and with p! 2/10 or less.     Progress-similar ROM-110, 01/11/21, KP   Sessions: 22      Goal 2: Pt will improve knee flexion strength by 0.5 MMT in order to improve ability to negotiate stairs.     Met, 01/11/21, KP   Sessions: 22       Goal 3: Pt will improve hip extension strength by 0.5 MMT in order to improve ability to perform floor trransfers with improved speed and with p! 2/10 or less in order to improve safety and care for grandchildren.    Regression to 2+, improved functional goal without ability to lift fully against gravity 12/21/20 SS   Sessions: 22      Goal 4: Patient will demonstrate independence in prescribed HEP with proper form, sets and reps for safe discharge to an independent program.  Updated, 01/11/21, KP   Sessions: 22          Goal 5: Pt will improve B SLS B to 10s in order to improve safety with ambulation.    Goal met -improved from 4s to 21s7/27/22 SS   Sessions: 22                           Joycie Peek, DPT

## 2021-01-11 NOTE — PT/OT Therapy Note (Signed)
Name: Wendy [name] Age: 65 y.o.   Date of Service: 01/11/2021  Referring Physician: Abe People, MD   Date of Injury: 08/03/2020  Date Care Plan Established/Reviewed: 10/06/2020  Date Treatment Started: 10/06/2020  End of Certification Date: 01/03/2021  Sessions in Plan of Care: 22  Surgery Date: 08/03/2020  MD Follow-up: No data was found  Medbridge Code: No data was found    Visit Count: 13   Diagnosis:   1. Left knee pain, unspecified chronicity    2. Knee stiffness, left        Subjective     Social Support/Occupation                   Precautions: No data was found  Allergies: Iodine, Penicillins, Shellfish-derived products, Atorvastatin, Morphine, Tetanus immune globulin, and Zocor [simvastatin]  Primary complaint/Involved Side: L TKA    SUBJECTIVE:  Wendy [name] reports 80% back to prior level of function. Narrow stairs are most challenging with reciprocal gait. She is able to do wide steps but narrow are more challenging and she goes slower.     01/11/21 Functional Limitations (PLOF):  See POC Lifting with no difficulty  Stairs: descending>ascending  reciprocal gait,1 HR; Floor transfers no difficulty     OBJECTIVE:     Initial Evaluation Reference and/or Current Measurements(as dated):     Outcome Measure:  OUTCOMES  IE  11/24/20  01/11/21   FOTO 45 61  79   Pain          Balance:  SLS R: Eyes Open (EO): 4s.; 11/17/20: 21s; 12/21/20:   SLS L: Eyes Open (EO): 4s   11/17/20: 23s; 12/21/20:      Girth/Edema: L LE swollen at ankle and knee    Initial R Initial L 11/17/20  R, L    Mid Patellar 42 cm 46 cm 40.3, 45.2 cm   6" Above patella 55.7 cm 54.6 cm 55, 53.3   6" below patella 36.2 cm 38.6 cm 35.6, 35 cm   (blank fields were intentionally left blank)     Range of Motion: (degrees)  Initial R A/PROM Knee Initial L A/PROM 10/12/20 L A/PROM 10/28/20 L AROM 11/02/20 L AROM 11/17/20 L AROM 11/24/20 L A.PROM 12/21/20 L A/PROM 01/11/21  L A/PROM    129 Flexion 97 p! 95/100 >>post tx 100/110 Post tx - 100 100-post tx 100 95/100-110 post tx   100/106 p!! > post tx 107 110 110/   0 Extension Lacking 3/ 0 p! Lacking 3/0      0    (blank fields were intentionally left blank)        Initial  Uninvolved LE Strength  MMT /5 Initial R  Taken in Supine/prone 11/02/20 L 11/17/20 L 12/21/20 L 01/11/21  L     5 (10/07/20) Hip Flexion 5 5        Glute max NT p! c knee flexion   2+ B    3+ Hip Extension 3+ 4+        Hip Abduction 4 4+        Hip Adduction          4+ Quadriceps 4 4+ discomfort 4+ 5- 5    4 Hamstrings 3- p!! 4- p! In popliteal 4+  5- 5     Ankle Dorsiflexion           Ankle Plantarflexion         (blank fields were intentionally left blank)  TREATMENT:     Exercise Flow Sheet     Exercise Specifics 10/21/20 10/28/20 11/02/20 11/17/20 11/24/20 12/08/20 12/21/20 01/03/21 01/11/21   RC Rocking Knee flexion AAROM 2.5', KP Seated knee flexion with OP, 5x with hold as tolerated, KP SS   KG Prone toe down with knee straight + glute activation 10x SS NMR Recumbent bike  5 min  KG Recumbent bike  5 min, KP   SAQ 3#  LAQ 3# 10x 5, KP  NMR Wedge stretch, KP 2x30s SS    Rec Fem Stretch 2x60s  KG Rec Fem Stretch 2x60s, KP   Bridge   Leg press 70#, 2x10, KP Leg press - NV 70# >90 # 12x each SS  DL leg press 16#, SL 10#  2x12 SS  DL Leg press 96# c increased knee flexion angle 2x15 SS  90# bilat  45#  2x10  KG 90# B 10x; 50# 2x10 SS  Bilat 2x10  L only 2x10  KG 90# B 2x10, KP   LAR R Pball  Lateral step up green to blue 2x fatigue, KP L Forward step up/down   Green to SLS, fatigue, KP L FW step up 4" C pelvic neutral cues SS NMR L FW Step up with eccentric lowering cues SS NMR L FW step down 4" c  6" FW and backwards  10x  KG 6" step up and over FW/BW both sides- cue glutes  2x10 SS NMR 2x10  NMR  KG L hamstring curl, 2x10 RTB, KP      Side step   Lateral step over hurdles 10x 3 hurdles, KP R Lateral step up/down, green step, fatigue, KP R Lateral step up/down, green step, fatigue, SS NMR  Bosu -black stand 30s and mini squats 3x5 SS NMR BOSU ball step up and hold with L 10x  KG   10x  KG Cone tap L 2x5, KP   Leg press      Backward walking at barre 5x, KP  NMR Side step band - NV RTB at thighs 2x20' SS  Step up FW bosu blue c UE assist x1 and SPV 10x each SS NMR  Sustained knee flexion stretch prone c strap 5' SS      Step up    Blue Mini squat, KP Mini squat - NV Mini Squat 2x10 SS   Floor transfers SS TA Stairs in stairway   Up/down reciprocally  1 flight  KG         Heel raise  c ball at ankles  3 way cone tap 2x5 R and L, KP 3 way cone tap 2x5 R and L,   Knee ext   5 sec hold into green tband  10x  KG         Hurdles      Standing HS curl RTB at thighs clotth Standing HS curl   Standing HS curl YTB cloth 2x10 SS NMR 2x10  NMR  K        SLR                                (Initials = supervised exercise by clinician)     Home Exercise Program:  Access Code: [email or web address]  URL: [email or web address]  Date: 01/11/2021  Prepared by: Melina Fiddler    Exercises  Standing Quad Stretch with Table and Chair Support - 2 x daily - 7 x weekly - 1 sets - 2  reps - 60s hold  Seated Hamstring Stretch - 2 x daily - 7 x weekly - 1 sets - 2 reps - 60s hold  Mini Squat with Counter Support - 2 x daily - 3-5 x weekly - 1 sets - 10 reps  Full Leg Press - 1 x daily - 3-5 x weekly - 2-3 sets - 10 reps  Standing Hamstring Curl with Resistance - 1 x daily - 3-5 x weekly - 3 sets - 10 reps  Hamstring Curl with Weight Machine - 1 x daily - 3-5 x weekly - 2-3 sets - 10 reps  Single Leg Balance with Clock Reach - 2 x daily - 3-5 x weekly - 1 sets - 10 reps    THERAPEUTIC EXERCISE (To improve or develop Flexibility/ROM, Strength and endurance)   Warm-up-  Subjective obtained to assist with planning today's visit  Modifications/Patient Education: Progressed exercises and HEP per exercise flow sheet Cuing (Verbal, Visual and Tactile cues)  - See below    MANUAL THERAPY (To improve joint mobility, soft tissue mobility, and reduce trigger points)-Items denoted "MT" in flowsheet below  Supine: Tibia on femur AP  glide for knee flexion and capsule stretch  Measurements    NEUROMUSCULAR RE-ED (For activation and/or inhibition of target muscle,- for movement, balance, coordination, kinesthetic sense, posture, and/ or proprioception for sitting and/ or standing activities-Items denoted "NMR" in flowsheet below  -see above for glute activation cues     THERAPEUTIC ACTIVITY (For improvement in the completion of ADL's and functional activities.  Dynamic activities to improve functional performance, direct (one-on-one) with the patient  Reviewed full HEP program with education/instruction on how to progress exercises (frequency/duration) with sets, reps, hold times, and resistance to promote continued improvement for flexibility/strength/balance.  Special attention given to instruct patient to not push HEP to the point of pain and to contact our office if questions occur. Advised to follow-up with MD as prescribed/as needed.  Corrections/Modifications made today.    Handout provided to patient with above information and phone numbers.        MODALITIES      ASSESSMENT (response to treatment):  Ketra Duchesne has had good progress with physical therapy. She demonstrates 0-110 degrees active left knee range of motion. She has good quadriceps strength and functionally is able to perform squats and steps with limited difficulty. She was educated on continuing stretches/mobility exercises for maintaining range of motion and continuing quadriceps strengthening. She was provided comprehensive HEP and instructed to contact physical therapist with questions or concerns.     PLAN:  [place] to HEP. Goal status below.  Objective                         ---      Flowsheet Row ---   Total Time    Timed Minutes 40 minutes   Total Time 40 minutes                 Goals      Goal 1: Pt will improve L knee flexion to 120 deg in order to improve stair negotiation with 1 HR, with reciprocal gait, and with p! 2/10 or less.     Progress-similar ROM-110, 01/11/21,  KP   Sessions: 22      Goal 2: Pt will improve knee flexion strength by 0.5 MMT in order to improve ability to negotiate stairs.     Met, 01/11/21, KP   Sessions: 22  Goal 3: Pt will improve hip extension strength by 0.5 MMT in order to improve ability to perform floor trransfers with improved speed and with p! 2/10 or less in order to improve safety and care for grandchildren.    Regression to 2+, improved functional goal without ability to lift fully against gravity 12/21/20 SS   Sessions: 22      Goal 4: Patient will demonstrate independence in prescribed HEP with proper form, sets and reps for safe discharge to an independent program.  Updated, 01/11/21, KP   Sessions: 22          Goal 5: Pt will improve B SLS B to 10s in order to improve safety with ambulation.    Goal met -improved from 4s to 21s7/27/22 SS   Sessions: 22                           Joycie Peek, DPT

## 2021-01-29 ENCOUNTER — Encounter (INDEPENDENT_AMBULATORY_CARE_PROVIDER_SITE_OTHER): Payer: Self-pay

## 2021-02-02 ENCOUNTER — Encounter (INDEPENDENT_AMBULATORY_CARE_PROVIDER_SITE_OTHER): Payer: Self-pay | Admitting: Internal Medicine

## 2021-02-02 ENCOUNTER — Ambulatory Visit (INDEPENDENT_AMBULATORY_CARE_PROVIDER_SITE_OTHER): Payer: No Typology Code available for payment source | Admitting: Internal Medicine

## 2021-02-02 VITALS — BP 122/65 | HR 73 | Temp 97.7°F | Resp 19 | Ht 62.95 in | Wt 166.2 lb

## 2021-02-02 DIAGNOSIS — L299 Pruritus, unspecified: Secondary | ICD-10-CM

## 2021-02-02 DIAGNOSIS — I10 Essential (primary) hypertension: Secondary | ICD-10-CM

## 2021-02-02 DIAGNOSIS — E782 Mixed hyperlipidemia: Secondary | ICD-10-CM

## 2021-02-02 DIAGNOSIS — Z79899 Other long term (current) drug therapy: Secondary | ICD-10-CM

## 2021-02-02 DIAGNOSIS — Z23 Encounter for immunization: Secondary | ICD-10-CM

## 2021-02-02 DIAGNOSIS — Z833 Family history of diabetes mellitus: Secondary | ICD-10-CM

## 2021-02-02 LAB — BASIC METABOLIC PANEL
Anion Gap: 9 (ref 5.0–15.0)
BUN: 14 mg/dL (ref 7.0–21.0)
CO2: 25 mEq/L (ref 17–29)
Calcium: 9.4 mg/dL (ref 8.5–10.5)
Chloride: 105 mEq/L (ref 99–111)
Creatinine: 0.7 mg/dL (ref 0.4–1.0)
Glucose: 90 mg/dL (ref 70–100)
Potassium: 4 mEq/L (ref 3.5–5.3)
Sodium: 139 mEq/L (ref 135–145)

## 2021-02-02 LAB — LIPID PANEL
Cholesterol / HDL Ratio: 4.3 Index
Cholesterol: 193 mg/dL (ref 0–199)
HDL: 45 mg/dL (ref 40–9999)
LDL Calculated: 128 mg/dL — ABNORMAL HIGH (ref 0–99)
Triglycerides: 101 mg/dL (ref 34–149)
VLDL Calculated: 20 mg/dL (ref 10–40)

## 2021-02-02 LAB — HEMOGLOBIN A1C
Average Estimated Glucose: 119.8 mg/dL
Hemoglobin A1C: 5.8 % (ref 4.6–5.9)

## 2021-02-02 LAB — HEPATIC FUNCTION PANEL
ALT: 18 U/L (ref 0–55)
AST (SGOT): 19 U/L (ref 5–41)
Albumin/Globulin Ratio: 1.3 (ref 0.9–2.2)
Albumin: 4 g/dL (ref 3.5–5.0)
Alkaline Phosphatase: 87 U/L (ref 37–117)
Bilirubin Direct: 0.1 mg/dL (ref 0.0–0.5)
Bilirubin Indirect: 0.3 mg/dL (ref 0.2–1.0)
Bilirubin, Total: 0.4 mg/dL (ref 0.2–1.2)
Globulin: 3 g/dL (ref 2.0–3.6)
Protein, Total: 7 g/dL (ref 6.0–8.3)

## 2021-02-02 LAB — HEMOLYSIS INDEX: Hemolysis Index: 13 Index (ref 0–24)

## 2021-02-02 LAB — GFR: EGFR: >60

## 2021-02-02 MED ORDER — NEOMYCIN-POLYMYXIN-HC 3.5-10000-1 OT SOLN
3.0000 [drp] | Freq: Three times a day (TID) | OTIC | 1 refills | Status: AC
Start: 2021-02-02 — End: 2021-02-12

## 2021-02-02 NOTE — Progress Notes (Signed)
Have you seen any specialists/other providers since your last visit with us?    No    Arm preference verified?   Yes    The patient is due for pap smear and Tetanus Ten Year

## 2021-02-02 NOTE — Progress Notes (Signed)
Chief Complaint   Patient presents with    Hyperlipidemia    Hypertension     Hx HLD  Compliant with cholesterol medication  No Myalgias,  Muscle aches , bodyaches  Compliant with low saturated and low carbohydrate Diet  No hx abnormal liver test related to statin therapy     History of Chronic Hypertension  .The patient is compliant with anti-hypertensive therapy and denies side effects to therapy.  Pt denies CP, SOB, dizziness, orthopnea, PND or edema.   Is tolerating meds without side effects.   Patient  is compliant with Medications    Reports itchy external ear,, no rash discharge     Wants Aic check given FJX diabetes    Also requested shingles vaccine    Past Medical History:   Diagnosis Date    Brain tumor (benign)     Hypercholesteremia     Hypertensive disorder       Past Surgical History:   Procedure Laterality Date    ARTHROPLASTY, KNEE, TOTAL MAKOPLASTY Left 08/03/2020    Procedure: ARTHROPLASTY, TOTAL, KNEE MAKOPLASTY;  Surgeon: Abe People, MD;  Location: [facility];  Service: Orthopedics;  Laterality: Left;    BRAIN SURGERY  2009    BREAST BIOPSY Right 2008    right breast biopsy- benign    BREAST BIOPSY Right 1970s    right breast tumor removed 40+ years ago - benign    BREAST CYST EXCISION  1970s    right breast tumor removed 40+ years ago - benign    CESAREAN SECTION      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, CORNEAL IMPLANT Left     EYE SURGERY      KNEE SURGERY Left 08/03/2020    TUBAL LIGATION        (Not in a hospital admission)    Current/Home Medications    ACETAZOLAMIDE (DIAMOX) 125 MG TABLET    Take 125 mg by mouth once as needed    ASPIRIN EC 325 MG TABLET    Take 1 tablet (325 mg total) by mouth 2 (two) times daily    CO-ENZYME Q-10 50 MG CAPSULE    Take 50 mg by mouth daily.    LISINOPRIL (ZESTRIL) 20 MG TABLET    Take 20 mg by mouth daily    MULTIPLE VITAMIN (MULTIVITAMIN) TABLET    Take 1 tablet by mouth daily.    PREDNISOLONE ACETATE (PRED FORTE) 1 % OPHTHALMIC SUSPENSION     Place 1 drop into the left eye 4 (four) times daily    ROSUVASTATIN (CRESTOR) 20 MG TABLET    1 tab qod    TRIAMCINOLONE (KENALOG) 0.025 % OINTMENT    Apply topically 2 (two) times daily     Allergies   Allergen Reactions    Iodine Shortness Of Breath    Penicillins Shortness Of Breath     Has patient had a PCN reaction causing immediate rash, facial/tongue/throat swelling, SOB or lightheadedness with hypotension: Yes  Has patient had a PCN reaction causing severe rash involving mucus membranes or skin necrosis: No  Has patient had a PCN reaction that required hospitalization: No  Has patient had a PCN reaction occurring within the last 10 years: Yes  If all of the above answers are "NO", then may proceed with Cephalosporin use.    Shellfish-Derived Products Anaphylaxis    Atorvastatin      Joint pain     Morphine      Itchy  Tetanus Immune Globulin      Arm swelling     Zocor [Simvastatin]      No response       Social History     Tobacco Use    Smoking status: Never    Smokeless tobacco: Never   Substance Use Topics    Alcohol use: Yes     Alcohol/week: 1.0 - 2.0 standard drink     Types: 1 - 2 Shots of liquor per week      Family History   Problem Relation Age of Onset    Diabetes Mother     Hypertension Mother     Diabetes Father     Hypertension Father     Breast cancer Neg Hx         Review of Systems   Constitutional:  Negative for chills, fever, malaise/fatigue and weight loss.   HENT:  Negative for ear discharge, ear pain, hearing loss and tinnitus.         Reports itchy ears   Eyes: Negative.    Respiratory:  Negative for cough and shortness of breath.    Cardiovascular:  Negative for chest pain, palpitations and leg swelling.   Musculoskeletal:  Negative for myalgias.   Neurological:  Negative for dizziness, tingling, tremors, sensory change, speech change, focal weakness and headaches.             BP 122/65    Pulse 73    Temp 97.7 F (36.5 C) (Temporal)    Resp 19    Ht 1.599 m (5' 2.95")    Wt 75.4  kg (166 lb 3.2 oz)    SpO2 99%    BMI 29.48 kg/m   Wt Readings from Last 3 Encounters:   02/02/21 75.4 kg (166 lb 3.2 oz)   08/18/20 77.6 kg (171 lb)   08/03/20 77.5 kg (170 lb 14.4 oz)           Mental status - alert, oriented to person, place, and time  HEENT: ear canal Normal, dry scanty cerumen, TM normal B/L   Conjunctiva is clear.   Neck - supple, no cervical  Adenopathy,No carotid bruit  Chest - clear to auscultation, no wheezes, rales or rhonchi, symmetric air entry  Heart - normal rate, regular rhythm, normal S1, S2, no murmurs, rubs, clicks or gallops  Abdomen - soft, nontender, nondistended, no masses or organomegaly, no hepatosplenomegaly   Neurological - alert, oriented, normal speech, no focal findings motor and sensory grossly normal bilaterally  Extremities - dorsalis pedis  pulses normal, no pedal edema,       Assessment/Plan:      1. Essential hypertension    - Basic Metabolic Panel    2. Mixed hyperlipidemia    - Lipid panel    3. On statin therapy    - Hepatic function panel (LFT)    4. FHx: diabetes mellitus    - Hemoglobin A1C    5. Need for shingles vaccine    - Zoster Vaccine Recomb,Adjuvanted (IM)    6. Ear itching    - neomycin-polymyxin-hydrocortisone (CORTISPORIN) otic solution; Place 3 drops into both ears 3 (three) times daily for 10 days  Dispense: 10 mL; Refill: 1    Body mass index is 29.48 kg/m.      Dr Windell Moment MD  Internist  [facility] [place]  [address], [place],  [place] [id number]  [phone]  Fax-[phone]

## 2021-02-03 ENCOUNTER — Encounter (INDEPENDENT_AMBULATORY_CARE_PROVIDER_SITE_OTHER): Payer: Self-pay | Admitting: Internal Medicine

## 2021-03-01 ENCOUNTER — Encounter (INDEPENDENT_AMBULATORY_CARE_PROVIDER_SITE_OTHER): Payer: Self-pay

## 2021-03-31 ENCOUNTER — Encounter (INDEPENDENT_AMBULATORY_CARE_PROVIDER_SITE_OTHER): Payer: Self-pay

## 2021-04-12 ENCOUNTER — Telehealth (INDEPENDENT_AMBULATORY_CARE_PROVIDER_SITE_OTHER): Payer: Self-pay | Admitting: Internal Medicine

## 2021-04-12 NOTE — Telephone Encounter (Signed)
Pt is asking for an order so she can get a second dose of the shingles vaccine please advice.

## 2021-04-14 ENCOUNTER — Other Ambulatory Visit (INDEPENDENT_AMBULATORY_CARE_PROVIDER_SITE_OTHER): Payer: Self-pay | Admitting: Internal Medicine

## 2021-04-14 ENCOUNTER — Encounter (INDEPENDENT_AMBULATORY_CARE_PROVIDER_SITE_OTHER): Payer: Self-pay

## 2021-04-14 DIAGNOSIS — Z23 Encounter for immunization: Secondary | ICD-10-CM

## 2021-04-14 NOTE — Telephone Encounter (Signed)
Pt notified via my chart of Shingrix vaccine placed orders.

## 2021-04-20 ENCOUNTER — Ambulatory Visit (INDEPENDENT_AMBULATORY_CARE_PROVIDER_SITE_OTHER): Payer: No Typology Code available for payment source

## 2021-04-20 DIAGNOSIS — Z23 Encounter for immunization: Secondary | ICD-10-CM

## 2021-04-20 NOTE — Progress Notes (Signed)
Patient presented to the office for SHINGRIX administration.  Received injection in the Left arm.  No reaction was noted and patient left in good condition.

## 2021-04-28 ENCOUNTER — Other Ambulatory Visit (INDEPENDENT_AMBULATORY_CARE_PROVIDER_SITE_OTHER): Payer: Self-pay | Admitting: Internal Medicine

## 2021-04-28 ENCOUNTER — Telehealth (INDEPENDENT_AMBULATORY_CARE_PROVIDER_SITE_OTHER): Payer: Self-pay | Admitting: Internal Medicine

## 2021-04-28 DIAGNOSIS — J069 Acute upper respiratory infection, unspecified: Secondary | ICD-10-CM

## 2021-04-28 MED ORDER — AZITHROMYCIN 250 MG PO TABS
ORAL_TABLET | ORAL | 0 refills | Status: AC
Start: 2021-04-28 — End: 2021-05-04

## 2021-04-28 NOTE — Telephone Encounter (Signed)
Pt calling in wanting to check on the status of a mychart message that was sent earlier today. Pt would like to be prescribed a z-pack.    "I've  had a cold stuffy nose slight  congestion dry cough  for a week now. No covid apparent. Can u ask Dr. Seth Bake  to send me a Z pack to [facility] or do I  need to come in?"    Pt insisted that she talk to someone at the office. Please callback pt and advise. Thank you    Callback: [phone]

## 2021-04-29 NOTE — Telephone Encounter (Signed)
Pt called nurse back but unable to reach the office when calling. Please reach out. Thank you.

## 2021-04-29 NOTE — Telephone Encounter (Signed)
Called pt to inform medication Z-pac was sent to patient's pharmacy yesterday 04/28/2021 no answer lvm

## 2021-05-01 ENCOUNTER — Encounter (INDEPENDENT_AMBULATORY_CARE_PROVIDER_SITE_OTHER): Payer: Self-pay

## 2021-05-19 IMAGING — MG DIGITAL SCREENING BILAT W/ TOMO W/ CAD
8 series · 8 of 24 positions shown · non-contrast
Comparison: Previous exam(s).

CLINICAL DATA: Screening.

EXAM:
DIGITAL SCREENING BILATERAL MAMMOGRAM WITH TOMO AND CAD

[R MLO synth-2D]
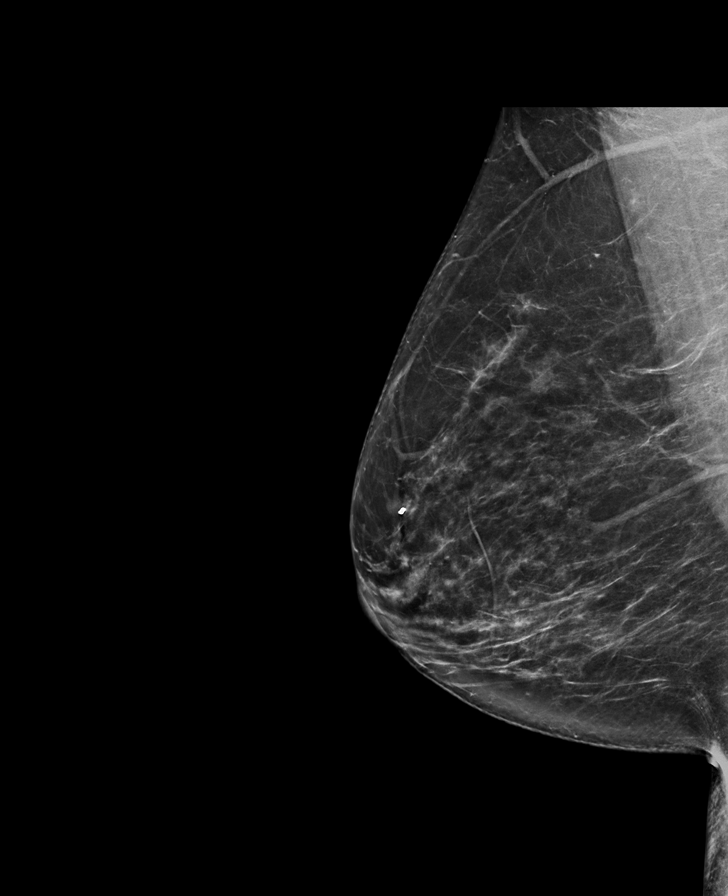

[L CC synth-2D]
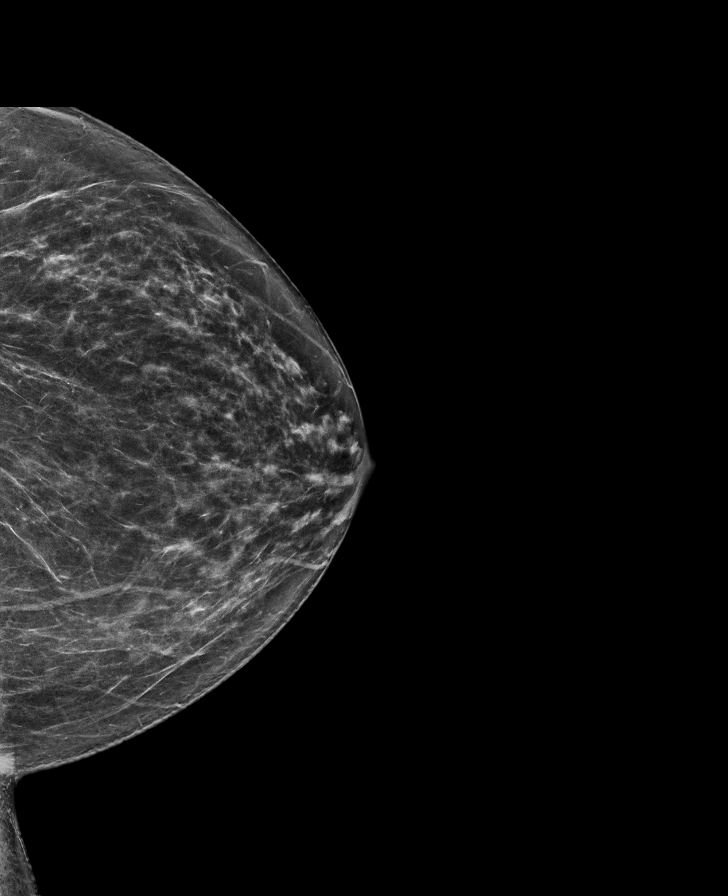

[L MLO synth-2D]
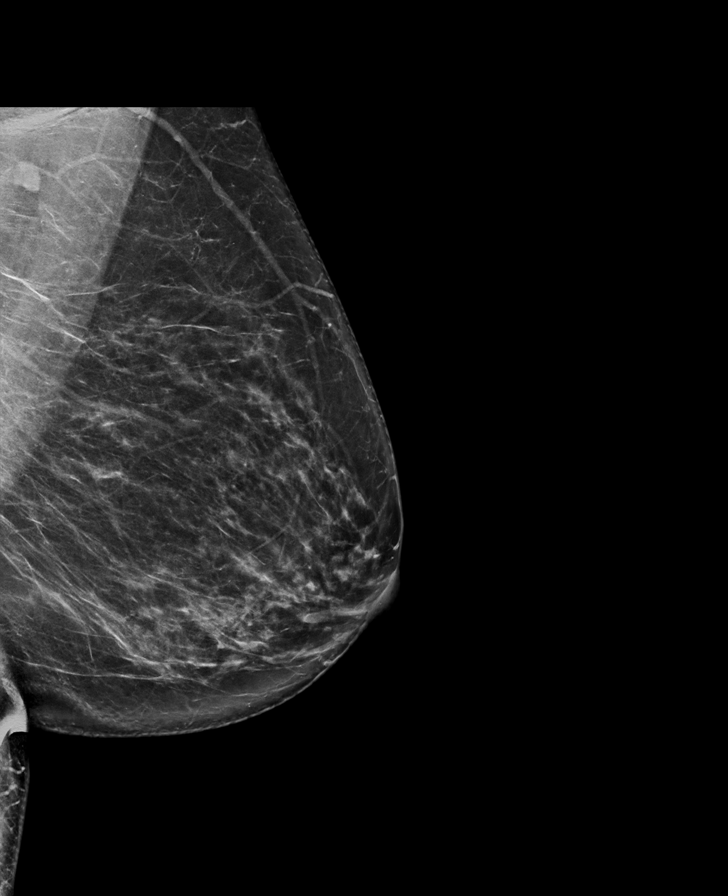

[R CC synth-2D]
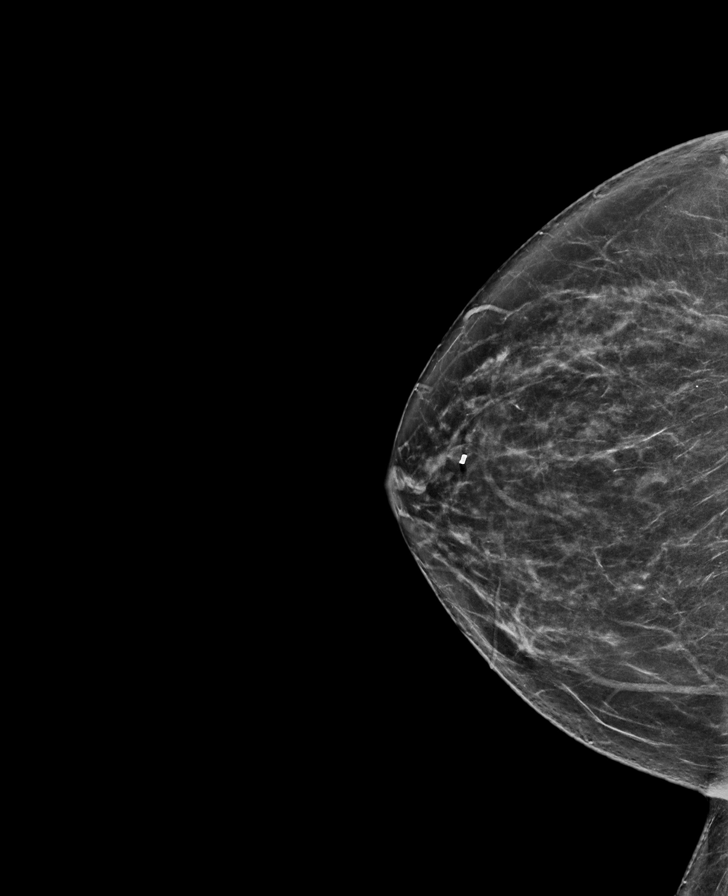

[L MLO tomo · tomo slice 41/80.0]
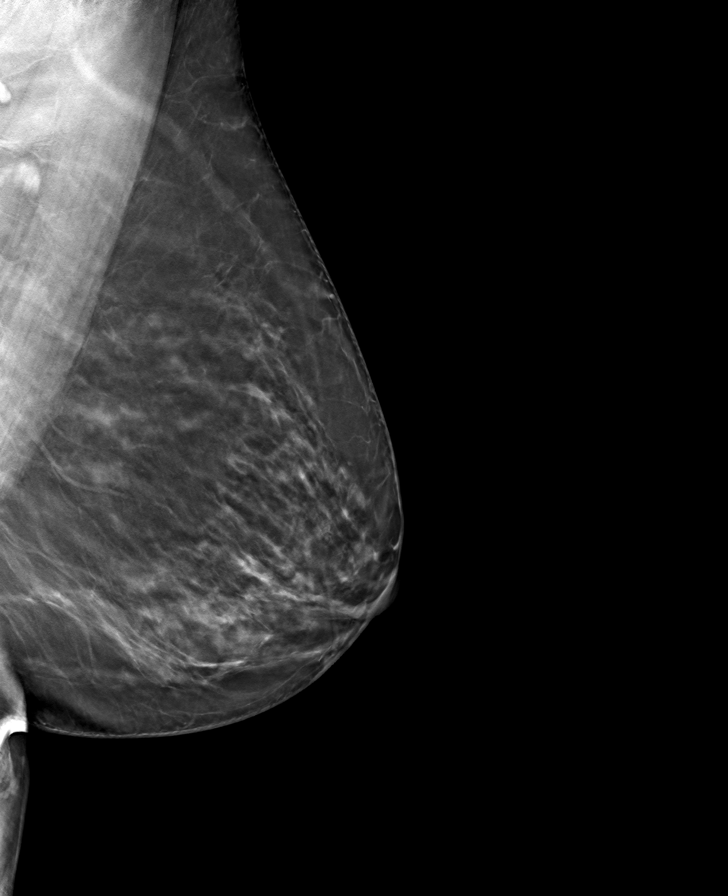

[R MLO tomo · tomo slice 39/76.0]
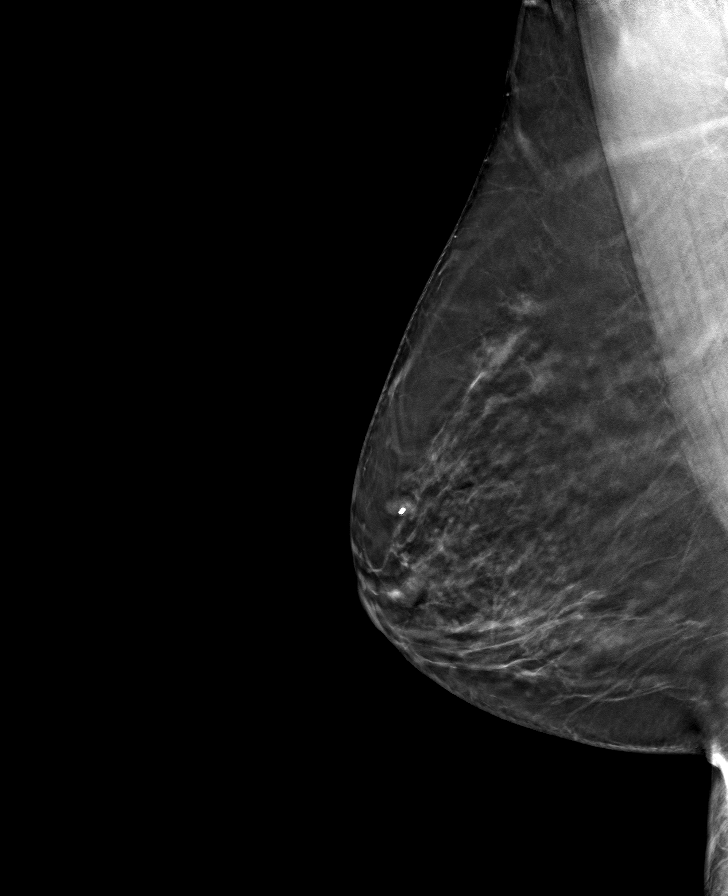

[R CC tomo · tomo slice 35/69.0]
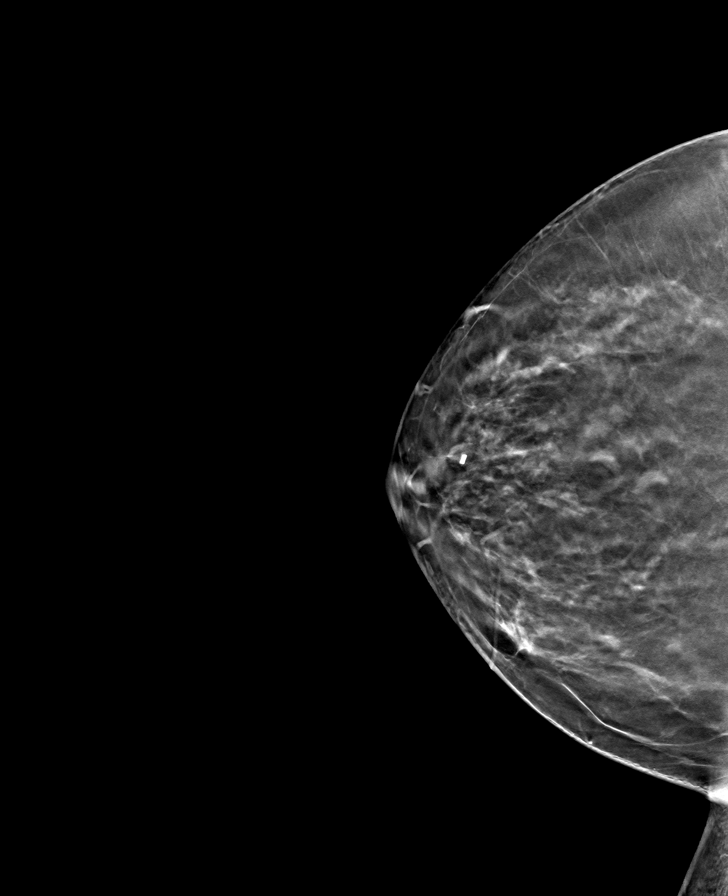

[L CC tomo · tomo slice 37/72.0]
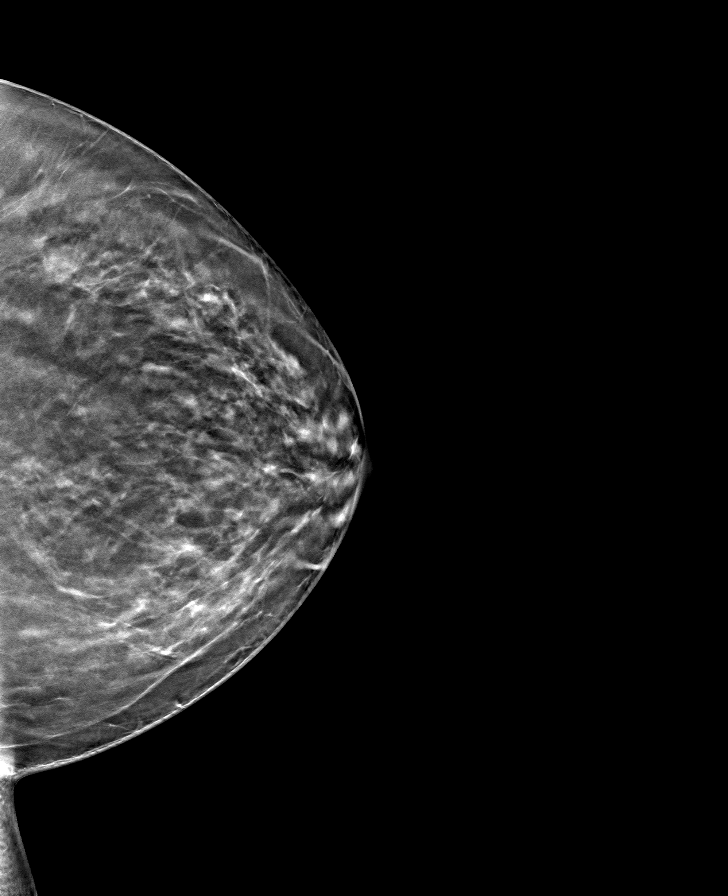

[8 of 24 positions shown; findings below may reference images not displayed]

ACR Breast Density Category b: There are scattered areas of
fibroglandular density.
FINDINGS: There are no findings suspicious for malignancy. Images were
processed with CAD.
IMPRESSION: No mammographic evidence of malignancy. A result letter of this
screening mammogram will be mailed directly to the patient.

RECOMMENDATION:
Screening mammogram in one year. (Code:[id number])

BI-RADS CATEGORY  1: Negative.

## 2021-05-31 ENCOUNTER — Ambulatory Visit (INDEPENDENT_AMBULATORY_CARE_PROVIDER_SITE_OTHER): Payer: No Typology Code available for payment source | Admitting: Foot & Ankle Surgery

## 2021-05-31 ENCOUNTER — Encounter (INDEPENDENT_AMBULATORY_CARE_PROVIDER_SITE_OTHER): Payer: Self-pay | Admitting: Foot & Ankle Surgery

## 2021-05-31 VITALS — BP 138/81 | HR 73 | Resp 14 | Ht 63.0 in | Wt 166.0 lb

## 2021-05-31 DIAGNOSIS — L84 Corns and callosities: Secondary | ICD-10-CM

## 2021-05-31 DIAGNOSIS — B351 Tinea unguium: Secondary | ICD-10-CM

## 2021-05-31 NOTE — Progress Notes (Signed)
Podiatric Surgery Patient Visit    Patient Name: Wendy [name],Wendy [name]  Assessment:   -Patient is 66 y.o. female with with onychocryptosis and hyperkeratosis of multiple toes without evidence of infection  Onychomycosis multiple toenails      Plan:   -Prior medical records reviewed  - Diagnosis and treatment options discussed at length with the patient  - As a courtesy corns and calluses were all trimmed today.  Multiple calluses on bilateral feet were removed successfully using a #15 scalpel.  She tolerated this well with no issues  - Recommend she use a toe spacer between the left first and second toe to prevent recurrence of corn.  1 was provided to her today.  She can purchase additional spacers at the pharmacy or on [employer].  - Recommend supportive wide toe box shoes  - She can get regular pedicures to control callus or take care of it herself at home.  We discussed callus care at great length.  She can use medicated callus removal pads if calluses get large.  We discussed that regular debridement and removal here in the office is not covered by insurance.  -F/U as needed      Subjective:     Chief Complaint:   Chief Complaint   Patient presents with    Callus     Callus of both feet    Toe Pain     Right 3rd toe pain       HPI:  Wendy [name] is a 66 y.o. female.  Patient is here for evaluation of her nails and skin.  She was last seen in July 2020.  She was found to have onychomycosis as well as hyperkeratosis of skin in multiple areas.  She reports that she is not taking care of any of the calluses herself.  Her main concern is pain between her left first and second toe where she has a bump on the lateral side of her toe.  She does not do anything to treat this.      Denies N/V/F/C/SOB/CP  All other systems were reviewed and are negative  The following portions of the patient's history were reviewed and updated as appropriate: allergies, current medications, past medical history, past surgical history, family history,  and problem list.    Physical Exam:     Vitals:    05/31/21 1313   BP: 138/81   Pulse: 73   Resp: 14   SpO2: 97%       Gen: AAOx3, NAD, Well developed  HEENT: Normocephalic, EOMI  Heart: Regular pulse rate and rhythm  Vascular:  Palpable pedal pulses b/l, CFT to all toes <3 sec. No edema. No varicosities  Derm: Multiple plantar foot calluses most notably subfifth metatarsal head bilaterally.  There is a small corn interdigitally along the medial side of the left second toe.  This area is tender.  Multiple toenails are thickened and dystrophic.  Skin intact without wounds or rashes.  No erythema or ecchymosis.  MSK:   MMS 5/5 and symmetric, No other areas of pain or tenderness. No gross deformity. ROM normal without crepitus.  Neuro:  SILT. Neg tinel's sign.  Able to wiggle toes. No motor deficits.     Standing and gait exam: Gait is nonantalgic.  Normal gait.      Radiology:     Xrays taken in clinic today: None required        Claudette Head, DPM [facility]  [facility] Podiatric Surgery  Pager [phone]    ------------------------------------------------------------------------------------------------------------------------------  The review of the patient's medications does not in any way constitute an endorsement, by this clinician,  of their use, dosage, indications, route, efficacy, interactions, or other clinical parameters.    This note was generated within the [employer] EMR using Dragon medical speech recognition software and may contain inherent errors or omissions not intended by the user. Grammatical and punctuation errors, random word insertions, deletions, pronoun errors and incomplete sentences are occasional consequences of this technology due to software limitations. Not all errors are caught or corrected.  Although every attempt is made to root out erroneus and incomplete transcription, the note may still not fully represent the intent or opinion of the author. If there are questions or concerns about  the content of this note or information contained within the body of this dictation they should be addressed directly with the author for clarification.

## 2021-06-01 ENCOUNTER — Encounter (INDEPENDENT_AMBULATORY_CARE_PROVIDER_SITE_OTHER): Payer: Self-pay

## 2021-06-29 ENCOUNTER — Encounter (INDEPENDENT_AMBULATORY_CARE_PROVIDER_SITE_OTHER): Payer: Self-pay

## 2021-07-06 ENCOUNTER — Ambulatory Visit (INDEPENDENT_AMBULATORY_CARE_PROVIDER_SITE_OTHER): Payer: No Typology Code available for payment source | Admitting: Internal Medicine

## 2021-07-06 ENCOUNTER — Encounter (INDEPENDENT_AMBULATORY_CARE_PROVIDER_SITE_OTHER): Payer: Self-pay | Admitting: Internal Medicine

## 2021-07-06 VITALS — BP 128/70 | HR 73 | Temp 96.6°F | Resp 20 | Ht 63.0 in | Wt 161.8 lb

## 2021-07-06 DIAGNOSIS — Z1329 Encounter for screening for other suspected endocrine disorder: Secondary | ICD-10-CM

## 2021-07-06 DIAGNOSIS — K635 Polyp of colon: Secondary | ICD-10-CM

## 2021-07-06 DIAGNOSIS — Z130 Encounter for screening for diseases of the blood and blood-forming organs and certain disorders involving the immune mechanism: Secondary | ICD-10-CM

## 2021-07-06 DIAGNOSIS — Z1231 Encounter for screening mammogram for malignant neoplasm of breast: Secondary | ICD-10-CM

## 2021-07-06 DIAGNOSIS — Z1283 Encounter for screening for malignant neoplasm of skin: Secondary | ICD-10-CM

## 2021-07-06 DIAGNOSIS — Z23 Encounter for immunization: Secondary | ICD-10-CM

## 2021-07-06 DIAGNOSIS — Z79899 Other long term (current) drug therapy: Secondary | ICD-10-CM

## 2021-07-06 DIAGNOSIS — Z833 Family history of diabetes mellitus: Secondary | ICD-10-CM

## 2021-07-06 DIAGNOSIS — R519 Headache, unspecified: Secondary | ICD-10-CM

## 2021-07-06 DIAGNOSIS — I10 Essential (primary) hypertension: Secondary | ICD-10-CM

## 2021-07-06 DIAGNOSIS — Z0000 Encounter for general adult medical examination without abnormal findings: Secondary | ICD-10-CM

## 2021-07-06 DIAGNOSIS — E782 Mixed hyperlipidemia: Secondary | ICD-10-CM

## 2021-07-06 DIAGNOSIS — Z124 Encounter for screening for malignant neoplasm of cervix: Secondary | ICD-10-CM

## 2021-07-06 LAB — URINALYSIS REFLEX TO MICROSCOPIC EXAM - REFLEX TO CULTURE
Bilirubin, UA: NEGATIVE
Blood, UA: NEGATIVE
Glucose, UA: NEGATIVE
Ketones UA: NEGATIVE
Leukocyte Esterase, UA: NEGATIVE
Nitrite, UA: NEGATIVE
Protein, UR: NEGATIVE
Specific Gravity UA: 1.012 (ref 1.001–1.035)
Urine pH: 6.5 (ref 5.0–8.0)
Urobilinogen, UA: NORMAL mg/dL

## 2021-07-06 LAB — COMPREHENSIVE METABOLIC PANEL
ALT: 18 U/L (ref 0–55)
AST (SGOT): 21 U/L (ref 5–41)
Albumin/Globulin Ratio: 1.3 (ref 0.9–2.2)
Albumin: 4.3 g/dL (ref 3.5–5.0)
Alkaline Phosphatase: 86 U/L (ref 37–117)
Anion Gap: 8 (ref 5.0–15.0)
BUN: 18 mg/dL (ref 7.0–21.0)
Bilirubin, Total: 0.4 mg/dL (ref 0.2–1.2)
CO2: 29 mEq/L (ref 17–29)
Calcium: 9.9 mg/dL (ref 8.5–10.5)
Chloride: 100 mEq/L (ref 99–111)
Creatinine: 0.8 mg/dL (ref 0.4–1.0)
Globulin: 3.2 g/dL (ref 2.0–3.6)
Glucose: 92 mg/dL (ref 70–100)
Potassium: 4.1 mEq/L (ref 3.5–5.3)
Protein, Total: 7.5 g/dL (ref 6.0–8.3)
Sodium: 137 mEq/L (ref 135–145)

## 2021-07-06 LAB — CBC AND DIFFERENTIAL
Absolute NRBC: 0 10*3/uL (ref 0.00–0.00)
Basophils Absolute Automated: 0.06 10*3/uL (ref 0.00–0.08)
Basophils Automated: 1.3 %
Eosinophils Absolute Automated: 0.35 10*3/uL (ref 0.00–0.44)
Eosinophils Automated: 7.5 %
Hematocrit: 42.4 % (ref 34.7–43.7)
Hgb: 13.4 g/dL (ref 11.4–14.8)
Immature Granulocytes Absolute: 0.01 10*3/uL (ref 0.00–0.07)
Immature Granulocytes: 0.2 %
Instrument Absolute Neutrophil Count: 1.4 10*3/uL (ref 1.10–6.33)
Lymphocytes Absolute Automated: 2.25 10*3/uL (ref 0.42–3.22)
Lymphocytes Automated: 48.5 %
MCH: 28 pg (ref 25.1–33.5)
MCHC: 31.6 g/dL (ref 31.5–35.8)
MCV: 88.5 fL (ref 78.0–96.0)
MPV: 9.9 fL (ref 8.9–12.5)
Monocytes Absolute Automated: 0.57 10*3/uL (ref 0.21–0.85)
Monocytes: 12.3 %
Neutrophils Absolute: 1.4 10*3/uL (ref 1.10–6.33)
Neutrophils: 30.2 %
Nucleated RBC: 0 /100 WBC (ref 0.0–0.0)
Platelets: 420 10*3/uL — ABNORMAL HIGH (ref 142–346)
RBC: 4.79 10*6/uL (ref 3.90–5.10)
RDW: 13 % (ref 11–15)
WBC: 4.64 10*3/uL (ref 3.10–9.50)

## 2021-07-06 LAB — LIPID PANEL
Cholesterol / HDL Ratio: 4.3 Index
Cholesterol: 195 mg/dL (ref 0–199)
HDL: 45 mg/dL (ref 40–9999)
LDL Calculated: 134 mg/dL — ABNORMAL HIGH (ref 0–99)
Triglycerides: 78 mg/dL (ref 34–149)
VLDL Calculated: 16 mg/dL (ref 10–40)

## 2021-07-06 LAB — HEMOLYSIS INDEX: Hemolysis Index: 23 Index (ref 0–24)

## 2021-07-06 LAB — HEMOGLOBIN A1C
Average Estimated Glucose: 116.9 mg/dL
Hemoglobin A1C: 5.7 % (ref 4.6–5.9)

## 2021-07-06 LAB — GFR: EGFR: >60

## 2021-07-06 LAB — TSH: TSH: 0.8 u[IU]/mL (ref 0.35–4.94)

## 2021-07-06 MED ORDER — ACETAZOLAMIDE 125 MG PO TABS
125.0000 mg | ORAL_TABLET | Freq: Once | ORAL | 5 refills | Status: DC | PRN
Start: 2021-07-06 — End: 2022-04-21

## 2021-07-06 MED ORDER — LOSARTAN POTASSIUM 25 MG PO TABS
25.0000 mg | ORAL_TABLET | Freq: Every day | ORAL | 1 refills | Status: DC
Start: 2021-07-06 — End: 2022-01-11

## 2021-07-06 MED ORDER — NORTRIPTYLINE HCL 10 MG PO CAPS
10.0000 mg | ORAL_CAPSULE | Freq: Every day | ORAL | 0 refills | Status: DC | PRN
Start: 2021-07-06 — End: 2022-04-21

## 2021-07-06 MED ORDER — ROSUVASTATIN CALCIUM 20 MG PO TABS
ORAL_TABLET | ORAL | 1 refills | Status: DC
Start: 2021-07-06 — End: 2022-01-25

## 2021-07-06 NOTE — Patient Instructions (Signed)
Wt Readings from Last 3 Encounters:   07/06/21 73.4 kg (161 lb 12.8 oz)   05/31/21 75.3 kg (166 lb)   02/02/21 75.4 kg (166 lb 3.2 oz)      Body mass index is 28.66 kg/m.         BMI readings as below   18.9 to 24.9 -Normal   25-29.9-overweight  30-34.9-stage-1  35-39.9-stage 2  40-44.9-stage 3  >45-morbid         Try to limit the amount of saturated fat and trans fat in your diet.  These are in foods like red meat, vegetable oil, butter, dairy products that are not low-fat (such as most cheese, 2% or whole milk, creamer, etc), fried foods, and rich sweets like cakes/donuts/etc.  You should aim to keep saturated fats under 15-20g per day.  In addition, read ingredient labels and avoid any hydrogenated or partially-hydrogenated oils.      There are some fats that are good for you - these are unsaturated fats (especially monounsaturated).  These are found in olive oil, canola oil, nuts, seeds, and avocados.  Try to replace saturated and trans fats with these kinds of foods.    Try to limit the amount of sodium in your diet to less than 2000mg  per day.  Most people do not get the majority of their sodium from table salt.  The sodium comes from processed foods.  Anything canned, frozen, pre-packaged, etc usually has a large amount of sodium, so read the labels.  Deli meat is another common source.      Eating out is the most common source of dietary sodium.  Almost all restaurant meals have extremely high sodium (1000-3000mg  plus!), even in most cases the salads.  Minimize eating out, and ask if they have any low sodium options.  But generally, fresh cooking at home is the healthiest.      Try to increase your physical activity, especially cardio.  Even starting small with walking 10 minutes a day helps.  Gradually build up to a goal of 30 minutes of moderate activity (brisk walking) 5 days per week.  You should also add in some core strength training (abdomen and back) and light stretching to help prevent injury and  chronic back pain.  See me once yearly for annual preventative visit.  This is different and separate from any problem-related visit.    Be compliant with medications  Increase vegetables, fruits, and fiber in your diet.  Exercise at least 30 minutes 5 days per week.   Always wear your seatbelt in the car.  Wear sunscreen that is broad-spectrum (UVA/UVB) and at least SPF 30.  Avoid alcohol  drinking  And driving  Goal Blood Pressure <140/90  Schedule dental exam and cleaning every 6 months  Have your vision checked every 1-2 years.      Recommended preventive health screenings and vaccines for all adults   Blood pressure Every 2 years -- 75 years of age and older  Body mass index (BMI) Periodically -- 29 years of age and older  Cholesterol Every 5 years -- men 77 years of age and older;  Colorectal cancer Between 22 and 87 years of age -- colonoscopy every 10 years.   Depression Routinely   Alcohol misuse  Routinely  Tobacco use   Routinely    Vaccinations  Tetanus-diphtheria-pertussis (Td/Tdap)1 dose Tdap, then Td every 10 years  Influenza Every flu season  Pneumococcal 1 dose -- 41 years of age and older  Zoster 1 dose -- 7 years of age and older    Females  Mammogram Every 1-2 years -- women 85 years of age and older  Cervical cancer Every 3 years -- Pap smear for women 21-65 years   Chlamydia Routinely -- women 20 years of age and younger if sexually active  Osteoporosis (DEXA) Routinely -- women 14 years of age and older    Age 22-64 (middle years):   Diet and exercise d/w patient.   Self breast and testicular exam( in Males)  encouraged monthly   Clinical breast examination (yearly).   Skin exam with dermatology --for excessive skin exposure to sun or precancerous   Fasting lipid profile or nonfasting total cholesterol and HDL  cholesterol level (every 5 years).   Pelvic exam annually and Pap test every 3 years.   Digital rectal examination (annually).   After age 72, screen for colorectal cancer with  colonoscopy every 10 years or as advised.   Mammogram every 2 years    Oral cavity examination (every 1 to 3 years).   Dental exam (every 6 to 12 months).   Glaucoma (every 1 to 2 years after age 86).  Goal calcium intake 1057m daily (diet +/- supplement)  Goal vitamin D intake 600units daily (diet +/- supplement)    I appreciate the opportunity to help take care of you and your family. Here is some basic housekeeping:     1) Please be aware that I often send results/next steps via MyChart. Please check your account regularly.  2) There are some results that take up to 10 business days to be received and/or processed. If you have not heard from me regarding results in 7-10 business days please feel free to call [phone] send a message via [facility]

## 2021-07-06 NOTE — Progress Notes (Signed)
Chief Complaint   Patient presents with    Annual Exam     Fasting       Wendy [name] is a 66 y.o. female who presents today f medicare [employer] advantage     Health Risk Assessment     During the past month, how would you rate your general health?:  Very Good  Which of the following tasks can you do without assistance - drive or take the bus alone; shop for groceries or clothes; prepare your own meals; do your own housework/laundry; handle your own finances/pay bills; eat, bathe or get around your home?:  Do your own housework/laundry,Prepare your own meals,Shop for groceries or clothes,Eat, bathe, dress or get around your home,Drive or take the bus alone,Handle your own finances/pay bills  Which of the following problems have you been bothered by in the past month - dizzy when standing up; problems using the phone; feeling tired or fatigued; moderate or severe body pain?: Moderate or severe body pain (Knee pain)  Do you exercise for about 20 minutes 3 or more days per week?:  Yes  During the past month was someone available to help if you needed and wanted help?  For example, if you felt nervous, lonely, got sick and had to stay in bed, needed someone to talk to, needed help with daily chores or needed help just taking care of yourself.: Yes  Do you always wear a seat belt?: Yes  Do you have any trouble taking medications the way you have been told to take them?: No  Have you been given any information that can help you with keeping track of your medications?: No  Do you have trouble paying for your medications?: No  Have you been given any information that can help you with hazards in your house, such as scatter rugs, furniture, etc?: No  Do you feel unsteady when standing or walking?: No  Do you worry about falling?: No  Have you fallen two or more times in the past year?: No  Did you suffer any injuries from your falls in the past year?: No       Hx HLD  Compliant with cholesterol medication  No Myalgias,  Muscle  aches , bodyaches  Compliant with low saturated and low carbohydrate Diet  No hx abnormal liver test related to statin therapy       History of Chronic Hypertension  .The patient is compliant with anti-hypertensive therapy and denies side effects to therapy.  Pt denies CP, SOB, dizziness, orthopnea, PND or edema.   Is tolerating meds without side effects.   Patient  is compliant with Medications  BP at goal  She want sto stop lisinopril HCTZ an dtry losartan low dose     Hx left corneal transplanyt requesting prednisone drops from me  Sees eye specialist      Hospitalizations  no hospitalizations within past 6 months    Depression Screening    See related Activity or Flowsheet    Functional Ability    Falls Risk:  home does not have throw rugs, poor lighting or a slippery bath tub or shower  Hearing:  hearing within normal limits  Exercise:  walking  ADL's:   Bathing - independent   Dressing - independent   Mobility - independent   Transfer - independent   Eating - independent}   Toileting - independent   ADL assistance not needed          Vision Screening (required  for IPPE only): not performed by staff today           Evaluation of Cognitive Function    Mood/affect: Appropriate  Appearance: neatly groomed, appropriately and adequately nourished  Family member/caregiver input: Not present            Depression Screen  Q1: Over the past two weeks, have you felt down, depressed or hopeless? no  Q2: Over the past two weeks, have you felt little interest or pleasure in doing things? no          Cognitive evaluation:;  Level of consciousness; alert and awake through out the visit    Attention and concentration: able to stay focused, participated in the conversation, no tangential thoughts, good concentration, no distraction noted, able to give me series of even and odd numbers, oriented to time, place, person, year, month and date    Language: Fluent in english, no slurred speech,  , obeys simple and complex  commands  Performs simple and complex task                  Review of Systems:   Review of Systems - History obtained from the patient  General ROS: negative for - chills, fatigue, fever, malaise, or weight loss  Psychological ROS: negative for - anxiety, c, depression,   Ophthalmic ROS: Hx left corneal transplant   uses glasses  ENT ROS: negative for - hearing change,   Allergy and Immunology ROS: negative for - seasonal allergies  Breast ROS: negative for breast lumps  Respiratory ROS: no cough, shortness of breath, or wheezing  negative for - hemoptysis  Cardiovascular ROS: no chest pain or dyspnea on exertion  negative for - palpitations or paroxysmal nocturnal dyspnea  Gastrointestinal ROS: no abdominal pain, change in bowel habits, or black or bloody stools  Genito-Urinary ROS: no dysuria, trouble voiding, or hematuria: no menorrhagia or  abnormal cycle,   negative for - hematuria, urinary frequency/urgency or vulvar/vaginal symptoms  Musculoskeletal ROS: s/p left knee OA TKR  Neurological ROS: negative for - dizziness, gait disturbance, headaches, numbness/tingling or visual changes  Derm; hypopigmented  age spots   Vitals:    07/06/21 1110   BP: 128/70   Pulse: 73   Resp: 20   Temp: (!) 96.6 F (35.9 C)   SpO2: 99%         Physical Examination: General appearance - alert, well appearing, and in no distress and oriented to person, place, and time  Mental status - alert, oriented to person, place, and time, normal mood, behavior, speech, dress, motor activity, and thought processes  Eyes - pupils equal and reactive, extraocular eye movements intact  Ears - bilateral TM's and external ear canals normal  Neck - supple, no significant adenopathy, carotids upstroke normal bilaterally, no bruits and thyroid exam: thyroid is normal in size without nodules or tenderness  Chest - clear to auscultation, no wheezes, rales or rhonchi, symmetric air entry  Heart - normal rate, regular rhythm, normal S1, S2, no murmurs,  rubs, clicks or gallops  Abdomen - soft, nontender, nondistended, no masses or organomegaly  No hepatosplenomegaly or organomegaly  Neurological - alert, oriented, normal speech, no focal findings or movement disorder noted, , motor and sensory grossly normal bilaterally  Musculoskeletal - no joint tenderness, deformity or swelling, no muscular tenderness noted  Extremities - peripheral pulses normal, no pedal edema, no clubbing or cyanosis  Breasts: breasts appear normal, no suspicious masses, no skin or nipple changes  or axillary nodes.  Physical Examination: Pelvic - normal external genitalia, vulva, vagina, cervix, uterus and adnexa, PAP: Pap smear done today  Derm; tinu hypopigmented scattered macular age spots Thigh B/L and E           Assessment and Plan:   Wendy [name] is a 66 y.o. female here for medicare AWV   Medicare Annual Wellness Examination:  Health maintenance  - I counseled the pt on the benefits of taking calcium 1200-1500 mg po q daily, vit d 400-800iu po q daily, regular sun exposure and weight bearing exercise for bone health  - I also counseled the pt on the benefits of eating a healthy diet, exercising regularly, maintaining a healthy weight and getting enough sleep   - I also counseled the pt about the importance of wearing seat belts and no texting while driving and using sun protection  - Diet:  I rec eating plenty of fruits, vegetables and whole grains and  limiting intake of salt, saturated and trans fat foods and limiting intake of processed foods  - Exercise:  I rec exercise (as tolerated)  - Tobacco: I rec avoidance of smoking  - Weight: I rec maintaining a healthy weight  - Prevention of chronic diseases: I rec eating healthy diet, wearing sun screen in sun, not smoking. Limiting consumption of alcohol.    Cancer screening  - pap: 21-65;  2020 and today   - mammo: 50-75; referred,   - colonoscopy: 50-70( more if life expectancy is more; 07/09/2016 [facility], Hx polyp advised in 5  years  referred   -Dexa; referred     Immunization:  Zoster: UTD   Pneumovax: today   Hepatitis-B ; UTD  Influenza:; declined  Tdap; allergic   covid-19 UTD       Assessment/Plan:      1. Medicare annual wellness visit, subsequent    - Comprehensive metabolic panel  - Hemoglobin A1C  - Lipid panel  - TSH  - CBC and differential  - Urinalysis Reflex to Microscopic Exam- Reflex to Culture    2. Essential hypertension    - Comprehensive metabolic panel  - losartan (COZAAR) 25 MG tablet; Take 1 tablet (25 mg) by mouth daily  Dispense: 90 tablet; Refill: 1    3. Mixed hyperlipidemia    - Lipid panel  - rosuvastatin (CRESTOR) 20 MG tablet; 1 tab qod  Dispense: 90 tablet; Refill: 1    4. On statin therapy    - Comprehensive metabolic panel    5. FHx: diabetes mellitus      6. Screening for thyroid disorder    - TSH    7. Screening for deficiency anemia    - CBC and differential    8. Screening mammogram, encounter for    - Mammo Screening 3D/Tomo Bilateral    9. Pap smear for cervical cancer screening    - Pap Smear, Thin Prep Method w/ HR HPV    10. Polyp of colon, unspecified part of colon, unspecified type    - Ambulatory referral to Colorectal Surgery    11. Need for pneumococcal vaccination    - Pneumococcal Conjugate 20 - Valent    12. Nonintractable headache, unspecified chronicity pattern, unspecified headache type    - acetaZOLAMIDE (DIAMOX) 125 MG tablet; Take 1 tablet (125 mg) by mouth once as needed (HA)  Dispense: 30 tablet; Refill: 5  - nortriptyline (PAMELOR) 10 MG capsule; Take 1 capsule (10 mg) by mouth daily as needed (  Headache)  Dispense: 90 capsule; Refill: 0    13. Skin cancer screening    - Ambulatory referral to Dermatology    Body mass index is 28.66 kg/m.        Dr Windell Moment MD  Internist  [facility] [place]  [address], [place],  [id number]  [phone]  Fax-[phone]

## 2021-07-06 NOTE — Progress Notes (Signed)
Have you seen any specialists since your last visit with us?  No      The patient was informed that the following HM items are still outstanding:   pap smear and pneumonia vaccine

## 2021-07-07 ENCOUNTER — Other Ambulatory Visit (INDEPENDENT_AMBULATORY_CARE_PROVIDER_SITE_OTHER): Payer: Self-pay | Admitting: Internal Medicine

## 2021-07-07 ENCOUNTER — Encounter (INDEPENDENT_AMBULATORY_CARE_PROVIDER_SITE_OTHER): Payer: Self-pay | Admitting: Internal Medicine

## 2021-07-07 DIAGNOSIS — R7989 Other specified abnormal findings of blood chemistry: Secondary | ICD-10-CM

## 2021-07-08 ENCOUNTER — Other Ambulatory Visit (INDEPENDENT_AMBULATORY_CARE_PROVIDER_SITE_OTHER): Payer: Self-pay | Admitting: Internal Medicine

## 2021-07-08 DIAGNOSIS — E782 Mixed hyperlipidemia: Secondary | ICD-10-CM

## 2021-07-08 MED ORDER — EZETIMIBE 10 MG PO TABS
10.0000 mg | ORAL_TABLET | Freq: Every day | ORAL | 1 refills | Status: DC
Start: 2021-07-08 — End: 2022-01-11

## 2021-07-11 ENCOUNTER — Other Ambulatory Visit (FREE_STANDING_LABORATORY_FACILITY): Payer: No Typology Code available for payment source

## 2021-07-11 DIAGNOSIS — R7989 Other specified abnormal findings of blood chemistry: Secondary | ICD-10-CM

## 2021-07-11 LAB — CBC AND DIFFERENTIAL
Absolute NRBC: 0 10*3/uL (ref 0.00–0.00)
Basophils Absolute Automated: 0.07 10*3/uL (ref 0.00–0.08)
Basophils Automated: 1.5 %
Eosinophils Absolute Automated: 0.25 10*3/uL (ref 0.00–0.44)
Eosinophils Automated: 5.5 %
Hematocrit: 39.8 % (ref 34.7–43.7)
Hgb: 12.5 g/dL (ref 11.4–14.8)
Immature Granulocytes Absolute: 0 10*3/uL (ref 0.00–0.07)
Immature Granulocytes: 0 %
Instrument Absolute Neutrophil Count: 2.06 10*3/uL (ref 1.10–6.33)
Lymphocytes Absolute Automated: 1.55 10*3/uL (ref 0.42–3.22)
Lymphocytes Automated: 34.1 %
MCH: 28 pg (ref 25.1–33.5)
MCHC: 31.4 g/dL — ABNORMAL LOW (ref 31.5–35.8)
MCV: 89 fL (ref 78.0–96.0)
MPV: 9.7 fL (ref 8.9–12.5)
Monocytes Absolute Automated: 0.62 10*3/uL (ref 0.21–0.85)
Monocytes: 13.6 %
Neutrophils Absolute: 2.06 10*3/uL (ref 1.10–6.33)
Neutrophils: 45.3 %
Nucleated RBC: 0 /100 WBC (ref 0.0–0.0)
Platelets: 394 10*3/uL — ABNORMAL HIGH (ref 142–346)
RBC: 4.47 10*6/uL (ref 3.90–5.10)
RDW: 14 % (ref 11–15)
WBC: 4.55 10*3/uL (ref 3.10–9.50)

## 2021-07-12 ENCOUNTER — Ambulatory Visit
Admission: RE | Admit: 2021-07-12 | Discharge: 2021-07-12 | Disposition: A | Discharge status: Home / Self Care | Payer: No Typology Code available for payment source | Source: Ambulatory Visit | Attending: Internal Medicine | Admitting: Internal Medicine

## 2021-07-12 ENCOUNTER — Encounter (INDEPENDENT_AMBULATORY_CARE_PROVIDER_SITE_OTHER): Payer: Self-pay | Admitting: Internal Medicine

## 2021-07-12 DIAGNOSIS — Z1231 Encounter for screening mammogram for malignant neoplasm of breast: Secondary | ICD-10-CM | POA: Insufficient documentation

## 2021-07-18 LAB — PAP SMEAR, THIN PREP WITH HR HPV: HPV DNA, high risk: NOT DETECTED

## 2021-07-24 ENCOUNTER — Encounter (INDEPENDENT_AMBULATORY_CARE_PROVIDER_SITE_OTHER): Payer: Self-pay | Admitting: Internal Medicine

## 2021-07-25 ENCOUNTER — Encounter (INDEPENDENT_AMBULATORY_CARE_PROVIDER_SITE_OTHER): Payer: Self-pay | Admitting: Internal Medicine

## 2021-07-25 ENCOUNTER — Ambulatory Visit (INDEPENDENT_AMBULATORY_CARE_PROVIDER_SITE_OTHER): Payer: No Typology Code available for payment source | Admitting: Internal Medicine

## 2021-07-25 VITALS — BP 125/70 | HR 74 | Temp 97.1°F | Resp 18 | Ht 63.0 in | Wt 164.0 lb

## 2021-07-25 DIAGNOSIS — R300 Dysuria: Secondary | ICD-10-CM

## 2021-07-25 DIAGNOSIS — R8281 Pyuria: Secondary | ICD-10-CM

## 2021-07-25 LAB — POCT URINALYSIS DIPSTIX (10)(MULTI-TEST)
Bilirubin, UA POCT: NEGATIVE
Glucose, UA POCT: NEGATIVE mg/dL
Ketones, UA POCT: NEGATIVE mg/dL
Nitrite, UA POCT: NEGATIVE
POCT Spec Gravity, UA: 1.015 (ref 1.001–1.035)
POCT pH, UA: 6 (ref 5–8)
Urobilinogen, UA: 0.2 mg/dL

## 2021-07-25 MED ORDER — SULFAMETHOXAZOLE-TRIMETHOPRIM 800-160 MG PO TABS
1.0000 | ORAL_TABLET | Freq: Two times a day (BID) | ORAL | 0 refills | Status: AC
Start: 2021-07-25 — End: 2021-07-30

## 2021-07-25 NOTE — Progress Notes (Signed)
Chief Complaint   Patient presents with    Vaginal Itching    lower pelvic pain     CC:    Wendy [name] is a 66 y.o. female who presents for evaluation of  Above problem   She reports to burning urine and urgency associated with increased frequency at times, no blood, no incontinence, no vaginal discharge, no bladder pain, no genital ulcers, no flank pain, no fever or chills.  New partner  but declined STD screening as sh eis not concerned      Past Medical History:   Diagnosis Date    Brain tumor (benign)     Hypercholesteremia     Hypertensive disorder       Past Surgical History:   Procedure Laterality Date    ARTHROPLASTY, KNEE, TOTAL MAKOPLASTY Left 08/03/2020    Procedure: ARTHROPLASTY, TOTAL, KNEE MAKOPLASTY;  Surgeon: Abe People, MD;  Location: [place] [facility];  Service: Orthopedics;  Laterality: Left;    BRAIN SURGERY  2009    BREAST BIOPSY Right 2008    right breast biopsy- benign    BREAST BIOPSY Right 1970s    right breast tumor removed 40+ years ago - benign    BREAST CYST EXCISION  1970s    right breast tumor removed 40+ years ago - benign    CESAREAN SECTION      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, CORNEAL IMPLANT Left     EYE SURGERY      KNEE SURGERY Left 08/03/2020    TUBAL LIGATION        (Not in a hospital admission)    Current/Home Medications    ACETAZOLAMIDE (DIAMOX) 125 MG TABLET    Take 1 tablet (125 mg) by mouth once as needed (HA)    ASPIRIN EC 325 MG TABLET    Take 1 tablet (325 mg total) by mouth 2 (two) times daily    CO-ENZYME Q-10 50 MG CAPSULE    Take 50 mg by mouth daily.    EZETIMIBE (ZETIA) 10 MG TABLET    Take 1 tablet (10 mg) by mouth daily    LOSARTAN (COZAAR) 25 MG TABLET    Take 1 tablet (25 mg) by mouth daily    MULTIPLE VITAMIN (MULTIVITAMIN) TABLET    Take 1 tablet by mouth daily.    NORTRIPTYLINE (PAMELOR) 10 MG CAPSULE    Take 1 capsule (10 mg) by mouth daily as needed (Headache)    PREDNISOLONE ACETATE (PRED FORTE) 1 % OPHTHALMIC SUSPENSION    Place 1 drop into  the left eye 4 (four) times daily    ROSUVASTATIN (CRESTOR) 20 MG TABLET    1 tab qod    TRIAMCINOLONE (KENALOG) 0.025 % OINTMENT    Apply topically 2 (two) times daily     Allergies   Allergen Reactions    Iodine Shortness Of Breath    Penicillins Shortness Of Breath     Has patient had a PCN reaction causing immediate rash, facial/tongue/throat swelling, SOB or lightheadedness with hypotension: Yes  Has patient had a PCN reaction causing severe rash involving mucus membranes or skin necrosis: No  Has patient had a PCN reaction that required hospitalization: No  Has patient had a PCN reaction occurring within the last 10 years: Yes  If all of the above answers are "NO", then may proceed with Cephalosporin use.    Shellfish-Derived Products Anaphylaxis    Atorvastatin      Joint pain  Morphine      Itchy     Tetanus Immune Globulin      Arm swelling     Zocor [Simvastatin]      No response       Social History     Tobacco Use    Smoking status: Never    Smokeless tobacco: Never   Vaping Use    Vaping status: Never Used   Substance Use Topics    Alcohol use: Yes     Alcohol/week: 1.0 - 2.0 standard drink of alcohol     Types: 1 - 2 Shots of liquor per week      Family History   Problem Relation Age of Onset    Diabetes Mother     Hypertension Mother     Diabetes Father     Hypertension Father     Breast cancer Neg Hx           Review of Systems - General ROS: negative for - chills or fever  Respiratory ROS: no cough, shortness of breath, or wheezing  Cardiovascular ROS: no chest pain or dyspnea on exertion  Gastrointestinal ROS: no abdominal pain, change in bowel habits, or black or bloody stools, no flank pain   Genito-Urinary ROS: positive for - dysuria and urinary frequency/urgency  negative for - genital discharge or genital ulcers  No Suprapubic discomfort    BP 125/70 (BP Site: Right arm, Patient Position: Sitting, Cuff Size: Medium)   Pulse 74   Temp 97.1 F (36.2 C) (Temporal)   Resp 18   Ht 1.6 m (5'  3")   Wt 74.4 kg (164 lb)   SpO2 98%   BMI 29.05 kg/m   Wt Readings from Last 3 Encounters:   07/25/21 74.4 kg (164 lb)   07/06/21 73.4 kg (161 lb 12.8 oz)   05/31/21 75.3 kg (166 lb)          Physical Examination: General appearance - alert, well appearing, and in no distress and oriented to person, place, and time  Chest - clear to auscultation, no wheezes, rales or rhonchi, symmetric air entry  Heart - normal rate, regular rhythm, normal S1, S2, no murmurs, rubs, clicks or gallops  Abdomen - soft, nontender, nondistended, no masses or organomegaly  no CVA tenderness  Np  suprapubic discomfort noted    Assessment/Plan:      1. Dysuria    - POCT UA Dipstix (10)(Multi-Test)  - sulfamethoxazole-trimethoprim (BACTRIM DS) 800-160 MG per tablet; Take 1 tablet by mouth 2 (two) times daily for 5 days  Dispense: 10 tablet; Refill: 0  - Urine culture    2. Pyuria    - sulfamethoxazole-trimethoprim (BACTRIM DS) 800-160 MG per tablet; Take 1 tablet by mouth 2 (two) times daily for 5 days  Dispense: 10 tablet; Refill: 0  - Urine culture    Body mass index is 29.05 kg/m.        Risk & Benefits of the new medication(s) were explained to the patient who verbalized understanding & agreed to the treatment plan        Dr Windell MomentSunitha Armond Cuthrell MD  Internist  [facility] [place]  [address], [place],  [id number]  [phone]  [phone]

## 2021-07-25 NOTE — Progress Notes (Signed)
Have you seen any specialists since your last visit with us?  No      The patient was informed that the following HM items are still outstanding:   nothing at this time, HM is up-to-date.

## 2021-07-27 ENCOUNTER — Encounter (INDEPENDENT_AMBULATORY_CARE_PROVIDER_SITE_OTHER): Payer: Self-pay | Admitting: Internal Medicine

## 2021-07-30 ENCOUNTER — Encounter (INDEPENDENT_AMBULATORY_CARE_PROVIDER_SITE_OTHER): Payer: Self-pay

## 2021-08-10 ENCOUNTER — Ambulatory Visit (INDEPENDENT_AMBULATORY_CARE_PROVIDER_SITE_OTHER): Payer: No Typology Code available for payment source | Admitting: Internal Medicine

## 2021-08-29 ENCOUNTER — Encounter (INDEPENDENT_AMBULATORY_CARE_PROVIDER_SITE_OTHER): Payer: Self-pay

## 2021-09-05 ENCOUNTER — Encounter (INDEPENDENT_AMBULATORY_CARE_PROVIDER_SITE_OTHER): Payer: Self-pay

## 2021-09-12 ENCOUNTER — Encounter (INDEPENDENT_AMBULATORY_CARE_PROVIDER_SITE_OTHER): Payer: No Typology Code available for payment source | Admitting: Nurse Practitioner

## 2021-09-13 ENCOUNTER — Ambulatory Visit (INDEPENDENT_AMBULATORY_CARE_PROVIDER_SITE_OTHER): Payer: No Typology Code available for payment source | Admitting: Nurse Practitioner

## 2021-09-13 ENCOUNTER — Ambulatory Visit (INDEPENDENT_AMBULATORY_CARE_PROVIDER_SITE_OTHER): Payer: No Typology Code available for payment source | Admitting: Internal Medicine

## 2021-09-13 ENCOUNTER — Encounter (INDEPENDENT_AMBULATORY_CARE_PROVIDER_SITE_OTHER): Payer: Self-pay | Admitting: Internal Medicine

## 2021-09-13 VITALS — BP 137/79 | HR 67 | Temp 96.8°F | Resp 18 | Wt 162.6 lb

## 2021-09-13 DIAGNOSIS — Z833 Family history of diabetes mellitus: Secondary | ICD-10-CM

## 2021-09-13 DIAGNOSIS — I10 Essential (primary) hypertension: Secondary | ICD-10-CM

## 2021-09-13 DIAGNOSIS — E782 Mixed hyperlipidemia: Secondary | ICD-10-CM

## 2021-09-13 DIAGNOSIS — Z79899 Other long term (current) drug therapy: Secondary | ICD-10-CM

## 2021-09-13 NOTE — Progress Notes (Signed)
Chief Complaint   Patient presents with    Hyperlipidemia     Follow up    Hypertension     Follow up    DMV Forms       Patient had her  annual physical march  Sh eis on crestor 20mg , 40mg  sh eha sside effects  So zetia added   Noted abnormal Lipid panel  Lab Results   Component Value Date    CHOL 195 07/06/2021    CHOL 193 02/02/2021    CHOL 200 (H) 06/30/2020     Lab Results   Component Value Date    HDL 45 07/06/2021    HDL 45 02/02/2021    HDL 53 06/30/2020     Lab Results   Component Value Date    LDL 134 (H) 07/06/2021    LDL 128 (H) 02/02/2021    LDL 125 (H) 06/30/2020     Lab Results   Component Value Date    TRIG 78 07/06/2021    TRIG 101 02/02/2021    TRIG 109 06/30/2020         Compliant with cholesterol medication  No Myalgias,  Muscle aches , bodyaches  Compliant with low saturated and low carbohydrate Diet      Hx LefT  TKR and Hx brain tumor removed early 2000 Benign  She has had Permanent Handcap parking since then , she was in [place] now moved here needs renewal      Past Medical History:   Diagnosis Date    Brain tumor (benign)     Hypercholesteremia     Hypertensive disorder       Past Surgical History:   Procedure Laterality Date    ARTHROPLASTY, KNEE, TOTAL MAKOPLASTY Left 08/03/2020    Procedure: ARTHROPLASTY, TOTAL, KNEE MAKOPLASTY;  Surgeon: Abe People, MD;  Location: [place] [facility];  Service: Orthopedics;  Laterality: Left;    BRAIN SURGERY  2009    BREAST BIOPSY Right 2008    right breast biopsy- benign    BREAST BIOPSY Right 1970s    right breast tumor removed 40+ years ago - benign    BREAST CYST EXCISION  1970s    right breast tumor removed 40+ years ago - benign    CESAREAN SECTION      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, CORNEAL IMPLANT Left     EYE SURGERY      KNEE SURGERY Left 08/03/2020    TUBAL LIGATION        (Not in a hospital admission)    Current/Home Medications    ACETAZOLAMIDE (DIAMOX) 125 MG TABLET    Take 1 tablet (125 mg) by mouth once as needed (HA)    ASPIRIN EC  325 MG TABLET    Take 1 tablet (325 mg total) by mouth 2 (two) times daily    CO-ENZYME Q-10 50 MG CAPSULE    Take 1 capsule (50 mg) by mouth daily    EZETIMIBE (ZETIA) 10 MG TABLET    Take 1 tablet (10 mg) by mouth daily    LOSARTAN (COZAAR) 25 MG TABLET    Take 1 tablet (25 mg) by mouth daily    MULTIPLE VITAMIN (MULTIVITAMIN) TABLET    Take 1 tablet by mouth daily    NORTRIPTYLINE (PAMELOR) 10 MG CAPSULE    Take 1 capsule (10 mg) by mouth daily as needed (Headache)    PREDNISOLONE ACETATE (PRED FORTE) 1 % OPHTHALMIC SUSPENSION    Place  1 drop into the left eye 4 (four) times daily    ROSUVASTATIN (CRESTOR) 20 MG TABLET    1 tab qod    TRIAMCINOLONE (KENALOG) 0.025 % OINTMENT    Apply topically 2 (two) times daily     Allergies   Allergen Reactions    Iodine Shortness Of Breath    Penicillins Shortness Of Breath     Has patient had a PCN reaction causing immediate rash, facial/tongue/throat swelling, SOB or lightheadedness with hypotension: Yes  Has patient had a PCN reaction causing severe rash involving mucus membranes or skin necrosis: No  Has patient had a PCN reaction that required hospitalization: No  Has patient had a PCN reaction occurring within the last 10 years: Yes  If all of the above answers are "NO", then may proceed with Cephalosporin use.    Shellfish-Derived Products Anaphylaxis    Atorvastatin      Joint pain     Morphine      Itchy     Tetanus Immune Globulin      Arm swelling     Zocor [Simvastatin]      No response       Social History     Tobacco Use    Smoking status: Never    Smokeless tobacco: Never   Vaping Use    Vaping status: Never Used   Substance Use Topics    Alcohol use: Yes     Alcohol/week: 1.0 - 2.0 standard drink of alcohol     Types: 1 - 2 Shots of liquor per week      Family History   Problem Relation Age of Onset    Diabetes Mother     Hypertension Mother     Diabetes Father     Hypertension Father     Breast cancer Neg Hx         Review of Systems   Musculoskeletal:   Positive for joint pain.   All other systems reviewed and are negative.            BP 137/79 (BP Site: Left arm, Patient Position: Sitting, Cuff Size: Large)   Pulse 67   Temp (!) 96.8 F (36 C) (Temporal)   Resp 18   Wt 73.8 kg (162 lb 9.6 oz)   SpO2 100%   BMI 28.80 kg/m   Wt Readings from Last 3 Encounters:   09/13/21 73.8 kg (162 lb 9.6 oz)   07/25/21 74.4 kg (164 lb)   07/06/21 73.4 kg (161 lb 12.8 oz)           Mental status - alert, oriented to person, place, and time  Neck - supple, no cervical  Adenopathy,No carotid bruit  Chest - clear to auscultation, no wheezes, rales or rhonchi, symmetric air entry  Heart - normal rate, regular rhythm, normal S1, S2, no murmurs, rubs, clicks or gallops  Neurological - alert, oriented, normal speech, no focal findings or movement disorder noted, otor and sensory grossly normal bilaterally  Musculoskeletal - S/p Left Knee surgery   Extremities - dorsalis pedis  pulses normal, no pedal edema, no clubbing or cyanosis        Assessment/Plan:      1. Mixed hyperlipidemia    - Lipid panel; Future    2. On statin therapy    - Hepatic function panel (LFT); Future    3. FHx: diabetes mellitus    - Hemoglobin A1C; Future    4. Essential hypertension    -  Basic Metabolic Panel; Future    Body mass index is 28.8 kg/m.        Dr Windell Moment MD  Internist  [facility] [place]  [address], [place],  [place] [id number]  [phone]  [phone]

## 2021-09-13 NOTE — Progress Notes (Signed)
Have you seen any specialists since your last visit with us?  No      The patient was informed that the following HM items are still outstanding:   nothing at this time, HM is up-to-date.

## 2021-09-15 ENCOUNTER — Encounter (INDEPENDENT_AMBULATORY_CARE_PROVIDER_SITE_OTHER): Payer: Self-pay | Admitting: Internal Medicine

## 2021-09-29 ENCOUNTER — Encounter (INDEPENDENT_AMBULATORY_CARE_PROVIDER_SITE_OTHER): Payer: Self-pay

## 2021-10-29 ENCOUNTER — Encounter (INDEPENDENT_AMBULATORY_CARE_PROVIDER_SITE_OTHER): Payer: Self-pay

## 2021-11-02 ENCOUNTER — Ambulatory Visit (INDEPENDENT_AMBULATORY_CARE_PROVIDER_SITE_OTHER): Payer: No Typology Code available for payment source | Admitting: Internal Medicine

## 2021-11-02 ENCOUNTER — Encounter (INDEPENDENT_AMBULATORY_CARE_PROVIDER_SITE_OTHER): Payer: Self-pay | Admitting: Internal Medicine

## 2021-11-02 VITALS — BP 143/88 | HR 74 | Temp 97.8°F | Resp 18 | Ht 63.0 in | Wt 161.6 lb

## 2021-11-02 DIAGNOSIS — L509 Urticaria, unspecified: Secondary | ICD-10-CM

## 2021-11-02 DIAGNOSIS — I10 Essential (primary) hypertension: Secondary | ICD-10-CM

## 2021-11-02 DIAGNOSIS — Z833 Family history of diabetes mellitus: Secondary | ICD-10-CM

## 2021-11-02 DIAGNOSIS — L299 Pruritus, unspecified: Secondary | ICD-10-CM

## 2021-11-02 DIAGNOSIS — E782 Mixed hyperlipidemia: Secondary | ICD-10-CM

## 2021-11-02 DIAGNOSIS — Z79899 Other long term (current) drug therapy: Secondary | ICD-10-CM

## 2021-11-02 LAB — HEPATIC FUNCTION PANEL
ALT: 20 U/L (ref 0–55)
AST (SGOT): 24 U/L (ref 5–41)
Albumin/Globulin Ratio: 1.3 (ref 0.9–2.2)
Albumin: 4.1 g/dL (ref 3.5–5.0)
Alkaline Phosphatase: 84 U/L (ref 37–117)
Bilirubin Direct: 0.1 mg/dL (ref 0.0–0.5)
Bilirubin Indirect: 0.3 mg/dL (ref 0.2–1.0)
Bilirubin, Total: 0.4 mg/dL (ref 0.2–1.2)
Globulin: 3.1 g/dL (ref 2.0–3.6)
Protein, Total: 7.2 g/dL (ref 6.0–8.3)

## 2021-11-02 LAB — LIPID PANEL
Cholesterol / HDL Ratio: 4.7 Index
Cholesterol: 250 mg/dL — ABNORMAL HIGH (ref 0–199)
HDL: 53 mg/dL (ref 40–9999)
LDL Calculated: 175 mg/dL — ABNORMAL HIGH (ref 0–99)
Triglycerides: 109 mg/dL (ref 34–149)
VLDL Calculated: 22 mg/dL (ref 10–40)

## 2021-11-02 LAB — BASIC METABOLIC PANEL
Anion Gap: 11 (ref 5.0–15.0)
BUN: 11 mg/dL (ref 7.0–21.0)
CO2: 25 mEq/L (ref 17–29)
Calcium: 10 mg/dL (ref 8.5–10.5)
Chloride: 102 mEq/L (ref 99–111)
Creatinine: 0.7 mg/dL (ref 0.4–1.0)
Glucose: 93 mg/dL (ref 70–100)
Potassium: 4.1 mEq/L (ref 3.5–5.3)
Sodium: 138 mEq/L (ref 135–145)
eGFR: >60 mL/min/{1.73_m2} (ref >=60)

## 2021-11-02 LAB — HEMOLYSIS INDEX: Hemolysis Index: 6 Index (ref 0–24)

## 2021-11-02 LAB — HEMOGLOBIN A1C
Average Estimated Glucose: 116.9 mg/dL
Hemoglobin A1C: 5.7 % — ABNORMAL HIGH (ref 4.6–5.6)

## 2021-11-02 MED ORDER — METHYLPREDNISOLONE 4 MG PO TBPK
ORAL_TABLET | ORAL | 0 refills | Status: AC
Start: 2021-11-02 — End: 2021-11-08

## 2021-11-02 MED ORDER — HYDROXYZINE HCL 10 MG PO TABS
10.0000 mg | ORAL_TABLET | Freq: Four times a day (QID) | ORAL | 0 refills | Status: DC | PRN
Start: 2021-11-02 — End: 2023-03-20

## 2021-11-02 NOTE — Progress Notes (Signed)
Chief Complaint   Patient presents with    Medication Reaction    Body Itchy    Urticaria    Labs Only         HPI: she reports to itchiness on her hand palms mostly and whole body started yesterday after she took some chewable vit C   all of a sudden , otherwise does not remember any trigger, denis any change with lotions, detergents, colognue, perfume, soaps, cosmetics. No Rash but reports noted transient hives on her UE which is resolved   no assciated SOB, swelling of the tongue or throat, no stridor or wheezing. No associated association with food, no new medications. Took OTC benadryl plus used benadryl cream        Past Medical History:   Diagnosis Date    Brain tumor (benign)     Hypercholesteremia     Hypertensive disorder       Past Surgical History:   Procedure Laterality Date    ARTHROPLASTY, KNEE, TOTAL MAKOPLASTY Left 08/03/2020    Procedure: ARTHROPLASTY, TOTAL, KNEE MAKOPLASTY;  Surgeon: Abe People, MD;  Location: [place] [facility];  Service: Orthopedics;  Laterality: Left;    BRAIN SURGERY  2009    BREAST BIOPSY Right 2008    right breast biopsy- benign    BREAST BIOPSY Right 1970s    right breast tumor removed 40+ years ago - benign    BREAST CYST EXCISION  1970s    right breast tumor removed 40+ years ago - benign    CESAREAN SECTION      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, CORNEAL IMPLANT Left     EYE SURGERY      KNEE SURGERY Left 08/03/2020    TUBAL LIGATION        (Not in a hospital admission)    Current/Home Medications    ACETAZOLAMIDE (DIAMOX) 125 MG TABLET    Take 1 tablet (125 mg) by mouth once as needed (HA)    ASPIRIN EC 325 MG TABLET    Take 1 tablet (325 mg total) by mouth 2 (two) times daily    CO-ENZYME Q-10 50 MG CAPSULE    Take 1 capsule (50 mg) by mouth daily    EZETIMIBE (ZETIA) 10 MG TABLET    Take 1 tablet (10 mg) by mouth daily    LOSARTAN (COZAAR) 25 MG TABLET    Take 1 tablet (25 mg) by mouth daily    MULTIPLE VITAMIN (MULTIVITAMIN) TABLET    Take 1 tablet by mouth  daily    NORTRIPTYLINE (PAMELOR) 10 MG CAPSULE    Take 1 capsule (10 mg) by mouth daily as needed (Headache)    PREDNISOLONE ACETATE (PRED FORTE) 1 % OPHTHALMIC SUSPENSION    Place 1 drop into the left eye 4 (four) times daily    ROSUVASTATIN (CRESTOR) 20 MG TABLET    1 tab qod    TRIAMCINOLONE (KENALOG) 0.025 % OINTMENT    Apply topically 2 (two) times daily     Allergies   Allergen Reactions    Iodine Shortness Of Breath    Penicillins Shortness Of Breath     Has patient had a PCN reaction causing immediate rash, facial/tongue/throat swelling, SOB or lightheadedness with hypotension: Yes  Has patient had a PCN reaction causing severe rash involving mucus membranes or skin necrosis: No  Has patient had a PCN reaction that required hospitalization: No  Has patient had a PCN reaction occurring within the last  10 years: Yes  If all of the above answers are "NO", then may proceed with Cephalosporin use.    Shellfish-Derived Products Anaphylaxis    Atorvastatin      Joint pain     Morphine      Itchy     Tetanus Immune Globulin      Arm swelling     Zocor [Simvastatin]      No response       Social History     Tobacco Use    Smoking status: Never    Smokeless tobacco: Never   Substance Use Topics    Alcohol use: Yes     Alcohol/week: 1.0 - 2.0 standard drink of alcohol     Types: 1 - 2 Shots of liquor per week      Family History   Problem Relation Age of Onset    Diabetes Mother     Hypertension Mother     Diabetes Father     Hypertension Father     Breast cancer Neg Hx         Review of Systems   HENT:  Negative for congestion.    Respiratory:  Negative for shortness of breath, wheezing and stridor.    Skin:  Positive for itching. Negative for rash.   All other systems reviewed and are negative.        BP 143/88 (BP Site: Left arm, Patient Position: Sitting)   Pulse 74   Temp 97.8 F (36.6 C) (Temporal)   Resp 18   Ht 1.6 m (5\' 3" )   Wt 73.3 kg (161 lb 9.6 oz)   SpO2 98%   BMI 28.63 kg/m   Wt Readings from  Last 3 Encounters:   11/02/21 73.3 kg (161 lb 9.6 oz)   09/13/21 73.8 kg (162 lb 9.6 oz)   07/25/21 74.4 kg (164 lb)       Physical Examination: General appearance - alert, well appearing, and in no distress and oriented to person, place, and time  Mental status - alert, oriented to person, place, and time  Mouth - mucous membranes moist, pharynx normal without lesions, tonsils normal and tongue normal  Chest - clear to auscultation, no wheezes, rales or rhonchi, symmetric air entry  Heart - normal rate, regular rhythm, normal S1, S2, no murmurs,   Neurological - alert, oriented, normal speech, no focal findings Skin -   Physical Examination: Skin - normal coloration and turgor, no rashes, no suspicious skin lesions noted        Assessment/Plan:      1. Pruritus    - hydrOXYzine (ATARAX) 10 MG tablet; Take 1 tablet (10 mg) by mouth every 6 (six) hours as needed for Itching  Dispense: 30 tablet; Refill: 0  - methylPREDNISolone (MEDROL DOSEPAK) 4 MG tablet; Follow Package Directions.  Dispense: 21 tablet; Refill: 0    2. Hives    - hydrOXYzine (ATARAX) 10 MG tablet; Take 1 tablet (10 mg) by mouth every 6 (six) hours as needed for Itching  Dispense: 30 tablet; Refill: 0  - methylPREDNISolone (MEDROL DOSEPAK) 4 MG tablet; Follow Package Directions.  Dispense: 21 tablet; Refill: 0    3. Essential hypertension    - Basic Metabolic Panel    4. FHx: diabetes mellitus    - Hemoglobin A1C    5. Mixed hyperlipidemia    - Lipid panel    6. On statin therapy    - Hepatic function panel (LFT)    Body  mass index is 28.63 kg/m.        Dr Windell Moment MD  Internist  [facility] [place]  [address], [place],  [place] [id number]  [phone]  [phone]

## 2021-11-02 NOTE — Progress Notes (Signed)
Have you seen any specialists since your last visit with us?  No      The patient was informed that the following HM items are still outstanding:   nothing at this time, HM is up-to-date.

## 2021-11-03 ENCOUNTER — Encounter (INDEPENDENT_AMBULATORY_CARE_PROVIDER_SITE_OTHER): Payer: Self-pay | Admitting: Internal Medicine

## 2021-11-23 ENCOUNTER — Encounter (INDEPENDENT_AMBULATORY_CARE_PROVIDER_SITE_OTHER): Payer: No Typology Code available for payment source | Admitting: Internal Medicine

## 2021-11-29 ENCOUNTER — Encounter (INDEPENDENT_AMBULATORY_CARE_PROVIDER_SITE_OTHER): Payer: Self-pay

## 2021-12-22 ENCOUNTER — Encounter (INDEPENDENT_AMBULATORY_CARE_PROVIDER_SITE_OTHER): Payer: No Typology Code available for payment source | Admitting: Internal Medicine

## 2021-12-30 ENCOUNTER — Encounter (INDEPENDENT_AMBULATORY_CARE_PROVIDER_SITE_OTHER): Payer: Self-pay

## 2022-01-11 ENCOUNTER — Other Ambulatory Visit (INDEPENDENT_AMBULATORY_CARE_PROVIDER_SITE_OTHER): Payer: Self-pay | Admitting: Internal Medicine

## 2022-01-11 DIAGNOSIS — I10 Essential (primary) hypertension: Secondary | ICD-10-CM

## 2022-01-11 DIAGNOSIS — E782 Mixed hyperlipidemia: Secondary | ICD-10-CM

## 2022-01-25 ENCOUNTER — Telehealth (INDEPENDENT_AMBULATORY_CARE_PROVIDER_SITE_OTHER): Payer: No Typology Code available for payment source | Admitting: Internal Medicine

## 2022-01-25 ENCOUNTER — Encounter (INDEPENDENT_AMBULATORY_CARE_PROVIDER_SITE_OTHER): Payer: Self-pay | Admitting: Internal Medicine

## 2022-01-25 VITALS — Ht 64.0 in | Wt 158.0 lb

## 2022-01-25 DIAGNOSIS — U071 COVID-19: Secondary | ICD-10-CM

## 2022-01-25 DIAGNOSIS — J029 Acute pharyngitis, unspecified: Secondary | ICD-10-CM

## 2022-01-25 DIAGNOSIS — R5383 Other fatigue: Secondary | ICD-10-CM

## 2022-01-25 DIAGNOSIS — J988 Other specified respiratory disorders: Secondary | ICD-10-CM

## 2022-01-25 MED ORDER — NIRMATRELVIR&RITONAVIR 300/100 20 X 150 MG & 10 X 100MG PO TBPK
3.0000 | ORAL_TABLET | Freq: Two times a day (BID) | ORAL | 0 refills | Status: AC
Start: 2022-01-25 — End: 2022-01-30

## 2022-01-25 NOTE — Progress Notes (Signed)
Chief Complaint   Patient presents with    Nasal Congestion    Sore Throat     Tested Positive Covid home test this morning    Fatigue       Verbal consent has been obtained from the patient to conduct a video and telephone visit to minimize exposure to COVID-19: Yes    A two-way, synchronous, real- time, audio and visual interactive communication system between the patient and myself was utilized.     Time Spent: 15-30 min.    Video used: /Epic    Physical examination is limited to visual inspection with patient's input on video visit.      HPI:  She reports to being in again a family gathering, she started noticing tightness on Sunday and then she started having some sore throat with congestion and back pain, no fevers or chills she admits to some body ache as well, no headaches no diarrhea no altered sensation of taste or smell  She has had 2 original vaccine for COVID     Review of Systems   Constitutional:  Positive for malaise/fatigue. Negative for chills and fever.   HENT:  Positive for congestion and sore throat.    Gastrointestinal:  Negative for diarrhea.   Musculoskeletal:  Positive for back pain.   Neurological:  Negative for sensory change.          General Exam:   Gen:  Well-developed, well-nourished.  No acute distress. . Not ill looking   No facial asymmtery   Lungs: Unable to assess as visit done via telemedicine. Not in respiratory distress   CV: Unable to assess, as visit done via telemedicine.   Neck:  Normal .   Neuro   MENTAL STATUS:  Awake, alert, oriented x 3.  Affect is normal. Normal attention and concentration.  s.  Normal affect and mood.  Speech is fluent, non dysarthric.    MOTOR: ft.  Moves all extremities symmetrically.  N       Assessment/Plan:      1. Positive self-administered antigen test for COVID-19    - nirmatrelvir-ritonavir (PAXLOVID) 20 x 150 MG & 10 x 100MG  dose pack(emergency use authorization); Take 3 tablets by mouth 2 (two) times daily for 5 days The dosage for PAXLOVID  is 300 mg nirmatrelvir (two 150 mg tablets) with 100 mg ritonavir (one 100 mg tablet) with all three tablets taken together.  Dispense: 30 tablet; Refill: 0    2. Sore throat    - nirmatrelvir-ritonavir (PAXLOVID) 20 x 150 MG & 10 x 100MG  dose pack(emergency use authorization); Take 3 tablets by mouth 2 (two) times daily for 5 days The dosage for PAXLOVID is 300 mg nirmatrelvir (two 150 mg tablets) with 100 mg ritonavir (one 100 mg tablet) with all three tablets taken together.  Dispense: 30 tablet; Refill: 0    3. Other fatigue    - nirmatrelvir-ritonavir (PAXLOVID) 20 x 150 MG & 10 x 100MG  dose pack(emergency use authorization); Take 3 tablets by mouth 2 (two) times daily for 5 days The dosage for PAXLOVID is 300 mg nirmatrelvir (two 150 mg tablets) with 100 mg ritonavir (one 100 mg tablet) with all three tablets taken together.  Dispense: 30 tablet; Refill: 0    4. Congestion of upper airway    - nirmatrelvir-ritonavir (PAXLOVID) 20 x 150 MG & 10 x 100MG  dose pack(emergency use authorization); Take 3 tablets by mouth 2 (two) times daily for 5 days The dosage for PAXLOVID is 300  mg nirmatrelvir (two 150 mg tablets) with 100 mg ritonavir (one 100 mg tablet) with all three tablets taken together.  Dispense: 30 tablet; Refill: 0    Body mass index is 27.12 kg/m.      Risk & Benefits of the new medication(s) were explained to the patient who verbalized understanding & agreed to the treatment plan        Dr Windell Moment MD  Internist  [facility] [place]  [address], [place],  [place] [id number]  [phone]  Fax-[phone]

## 2022-01-29 ENCOUNTER — Encounter (INDEPENDENT_AMBULATORY_CARE_PROVIDER_SITE_OTHER): Payer: Self-pay

## 2022-03-01 ENCOUNTER — Encounter (INDEPENDENT_AMBULATORY_CARE_PROVIDER_SITE_OTHER): Payer: Self-pay

## 2022-03-31 ENCOUNTER — Encounter (INDEPENDENT_AMBULATORY_CARE_PROVIDER_SITE_OTHER): Payer: Self-pay

## 2022-04-21 ENCOUNTER — Encounter (INDEPENDENT_AMBULATORY_CARE_PROVIDER_SITE_OTHER): Payer: Self-pay | Admitting: Internal Medicine

## 2022-04-21 ENCOUNTER — Ambulatory Visit (INDEPENDENT_AMBULATORY_CARE_PROVIDER_SITE_OTHER): Payer: No Typology Code available for payment source | Admitting: Internal Medicine

## 2022-04-21 VITALS — BP 131/84 | HR 73 | Temp 97.5°F | Resp 18 | Ht 64.0 in | Wt 161.4 lb

## 2022-04-21 DIAGNOSIS — R7303 Prediabetes: Secondary | ICD-10-CM

## 2022-04-21 DIAGNOSIS — I10 Essential (primary) hypertension: Secondary | ICD-10-CM

## 2022-04-21 DIAGNOSIS — J3089 Other allergic rhinitis: Secondary | ICD-10-CM

## 2022-04-21 DIAGNOSIS — R21 Rash and other nonspecific skin eruption: Secondary | ICD-10-CM

## 2022-04-21 DIAGNOSIS — R519 Headache, unspecified: Secondary | ICD-10-CM

## 2022-04-21 DIAGNOSIS — L989 Disorder of the skin and subcutaneous tissue, unspecified: Secondary | ICD-10-CM

## 2022-04-21 DIAGNOSIS — Z1283 Encounter for screening for malignant neoplasm of skin: Secondary | ICD-10-CM

## 2022-04-21 DIAGNOSIS — E782 Mixed hyperlipidemia: Secondary | ICD-10-CM

## 2022-04-21 LAB — HEMOLYSIS INDEX: Hemolysis Index: 9 Index (ref 0–24)

## 2022-04-21 LAB — LIPID PANEL
Cholesterol / HDL Ratio: 4.5 Index
Cholesterol: 247 mg/dL — ABNORMAL HIGH (ref 0–199)
HDL: 55 mg/dL (ref 40–9999)
LDL Calculated: 176 mg/dL — ABNORMAL HIGH (ref 0–99)
Triglycerides: 79 mg/dL (ref 34–149)
VLDL Calculated: 16 mg/dL (ref 10–40)

## 2022-04-21 LAB — BASIC METABOLIC PANEL
Anion Gap: 8 (ref 5.0–15.0)
BUN: 15 mg/dL (ref 7.0–21.0)
CO2: 28 mEq/L (ref 17–29)
Calcium: 9.1 mg/dL (ref 8.5–10.5)
Chloride: 104 mEq/L (ref 99–111)
Creatinine: 0.7 mg/dL (ref 0.4–1.0)
Glucose: 98 mg/dL (ref 70–100)
Potassium: 4 mEq/L (ref 3.5–5.3)
Sodium: 140 mEq/L (ref 135–145)
eGFR: >60 mL/min/{1.73_m2} (ref >=60)

## 2022-04-21 LAB — HEPATIC FUNCTION PANEL
ALT: 19 U/L (ref 0–55)
AST (SGOT): 23 U/L (ref 5–41)
Albumin/Globulin Ratio: 1.2 (ref 0.9–2.2)
Albumin: 3.9 g/dL (ref 3.5–5.0)
Alkaline Phosphatase: 90 U/L (ref 37–117)
Bilirubin Direct: 0.1 mg/dL (ref 0.0–0.5)
Bilirubin Indirect: 0.2 mg/dL (ref 0.2–1.0)
Bilirubin, Total: 0.3 mg/dL (ref 0.2–1.2)
Globulin: 3.2 g/dL (ref 2.0–3.6)
Protein, Total: 7.1 g/dL (ref 6.0–8.3)

## 2022-04-21 LAB — HEMOGLOBIN A1C
Average Estimated Glucose: 108.3 mg/dL
Hemoglobin A1C: 5.4 % (ref 4.6–5.6)

## 2022-04-21 MED ORDER — NORTRIPTYLINE HCL 10 MG PO CAPS
10.0000 mg | ORAL_CAPSULE | Freq: Every day | ORAL | 5 refills | Status: AC | PRN
Start: 2022-04-21 — End: ?

## 2022-04-21 MED ORDER — ACETAZOLAMIDE 125 MG PO TABS
125.0000 mg | ORAL_TABLET | Freq: Once | ORAL | 5 refills | Status: AC | PRN
Start: 2022-04-21 — End: ?

## 2022-04-21 MED ORDER — NYSTATIN 100000 UNIT/GM EX OINT
TOPICAL_OINTMENT | Freq: Two times a day (BID) | CUTANEOUS | 0 refills | Status: AC
Start: 2022-04-21 — End: ?

## 2022-04-21 MED ORDER — FLUTICASONE PROPIONATE 50 MCG/ACT NA SUSP
1.0000 | Freq: Every day | NASAL | 3 refills | Status: AC
Start: 2022-04-21 — End: ?

## 2022-04-21 NOTE — Progress Notes (Signed)
Have you seen any specialists since your last visit with us?  No     The patient was informed that the following HM items are still outstanding:   There are no preventive care reminders to display for this patient.

## 2022-04-21 NOTE — Progress Notes (Signed)
Chief Complaint   Patient presents with    Medication Refill    Hyperlipidemia    Diabetes Follow-up   Rash    Hx HLD on Zetia 10 mg daily, has intolerance to statins  Compliant with cholesterol medication  No Myalgias,  Muscle aches , bodyaches  Compliant with low saturated and low carbohydrate Diet    History of Chronic Hypertension  .The patient is compliant with anti-hypertensive therapy and denies side effects to therapy.  Pt denies CP, SOB, dizziness, orthopnea, PND or edema.  Blood pressure at goal for age    History of chronic allergies, uses Flonase, requesting refills    History of migraine, she has been on Diamox and amitriptyline which she is taking as needed, requesting refills    She has history of left corneal transplant, she states she was also advised right corneal transplant eventually, now she is noticing some right visual changes, and is considering corneal transplant as well    She reports to a rash in between her buttocks, for few months now, it appears dry and sometimes itchy, she also reports some skin lesions that she gets during winter, they look like boils, and then she scratches them they turn out into pigmented circular lesions, I advised her to see dermatology      Past Medical History:   Diagnosis Date    Brain tumor (benign)     Hypercholesteremia     Hypertensive disorder       Past Surgical History:   Procedure Laterality Date    ARTHROPLASTY, KNEE, TOTAL MAKOPLASTY Left 08/03/2020    Procedure: ARTHROPLASTY, TOTAL, KNEE MAKOPLASTY;  Surgeon: Abe People, MD;  Location: [place] [facility];  Service: Orthopedics;  Laterality: Left;    BRAIN SURGERY  2009    BREAST BIOPSY Right 2008    right breast biopsy- benign    BREAST BIOPSY Right 1970s    right breast tumor removed 40+ years ago - benign    BREAST CYST EXCISION  1970s    right breast tumor removed 40+ years ago - benign    CESAREAN SECTION      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, CORNEAL IMPLANT Left     EYE SURGERY      KNEE  SURGERY Left 08/03/2020    TUBAL LIGATION        (Not in a hospital admission)    Current/Home Medications    ASPIRIN EC 325 MG TABLET    Take 1 tablet (325 mg total) by mouth 2 (two) times daily    CO-ENZYME Q-10 50 MG CAPSULE    Take 1 capsule (50 mg) by mouth daily    EZETIMIBE (ZETIA) 10 MG TABLET    Take 1 tablet (10 mg) by mouth daily    HYDROXYZINE (ATARAX) 10 MG TABLET    Take 1 tablet (10 mg) by mouth every 6 (six) hours as needed for Itching    LOSARTAN (COZAAR) 25 MG TABLET    Take 1 tablet (25 mg) by mouth daily    MULTIPLE VITAMIN (MULTIVITAMIN) TABLET    Take 1 tablet by mouth daily    PREDNISOLONE ACETATE (PRED FORTE) 1 % OPHTHALMIC SUSPENSION    Place 1 drop into the left eye 4 (four) times daily    TRIAMCINOLONE (KENALOG) 0.025 % OINTMENT    Apply topically 2 (two) times daily     Allergies   Allergen Reactions    Iodine Shortness Of Breath  Penicillins Shortness Of Breath     Has patient had a PCN reaction causing immediate rash, facial/tongue/throat swelling, SOB or lightheadedness with hypotension: Yes  Has patient had a PCN reaction causing severe rash involving mucus membranes or skin necrosis: No  Has patient had a PCN reaction that required hospitalization: No  Has patient had a PCN reaction occurring within the last 10 years: Yes  If all of the above answers are "NO", then may proceed with Cephalosporin use.    Shellfish-Derived Products Anaphylaxis    Atorvastatin      Joint pain     Morphine      Itchy     Tetanus Immune Globulin      Arm swelling     Zocor [Simvastatin]      No response       Social History     Tobacco Use    Smoking status: Never    Smokeless tobacco: Never   Substance Use Topics    Alcohol use: Yes     Alcohol/week: 1.0 - 2.0 standard drink of alcohol     Types: 1 - 2 Shots of liquor per week      Family History   Problem Relation Age of Onset    Diabetes Mother     Hypertension Mother     Diabetes Father     Hypertension Father     Breast cancer Neg Hx            Review of Systems:       Review of Systems - General ROS: negative for - chills, fatigue, fever or malaise  Psychological ROS: negative for - anxiety, depression,   Ophthalmic ROS: As HPI  Respiratory ROS: no cough, shortness of breath, or wheezing  Cardiovascular ROS: no chest pain or dyspnea on exertion  negative for - edema or palpitations  Gastrointestinal ROS: no abdominal pain, change in bowel habits, or black or bloody stools  Dermatology: As HPI  Neurological ROS: Chronic migraine negative for - dizziness,numbness/tingling, tremors, visual changes or weakness              BP 131/84 (BP Site: Right arm, Patient Position: Sitting, Cuff Size: Large)   Pulse 73   Temp 97.5 F (36.4 C) (Temporal)   Resp 18   Ht 1.626 m (5\' 4" )   Wt 73.2 kg (161 lb 6.4 oz)   SpO2 100%   BMI 27.70 kg/m   Wt Readings from Last 3 Encounters:   04/21/22 73.2 kg (161 lb 6.4 oz)   01/25/22 71.7 kg (158 lb)   11/02/21 73.3 kg (161 lb 9.6 oz)           Mental status - alert, oriented to person, place, and time  HEENT: Slightly cloudy cornea on the right, s/p corneal transplant on the left  Neck - supple, no cervical  Adenopathy,No carotid bruit  Chest - clear to auscultation, no wheezes, rales or rhonchi, symmetric air entry  Heart - normal rate, regular rhythm, normal S1, S2, no murmurs, rubs, clicks or gallops  Abdomen - soft, nontender, nondistended, no masses or organomegaly, no hepatosplenomegaly   Neurological - alert, oriented, normal speech, no focal findings , motor and sensory grossly normal bilaterally  Dermatology: Noted pigmented slightly raised irregular scaly lesion between her buttocks on the upper part of the gluteal cleft  About couple of lesions that are pigmented about half a centimeter macular circular noted on the forearm.  She mentioned the boil like itchy  lesion.  And 1 similar one noted on the left shin as well      Assessment/Plan:      1. Nonintractable headache, unspecified chronicity pattern,  unspecified headache type    - acetaZOLAMIDE (DIAMOX) 125 MG tablet; Take 1 tablet (125 mg) by mouth once as needed (HA)  Dispense: 30 tablet; Refill: 5  - nortriptyline (PAMELOR) 10 MG capsule; Take 1 capsule (10 mg) by mouth daily as needed (Headache)  Dispense: 30 capsule; Refill: 5    2. Allergic rhinitis due to other allergic trigger, unspecified seasonality    - fluticasone (FLONASE) 50 MCG/ACT nasal spray; 1 spray by Nasal route daily  Dispense: 16 g; Refill: 3    3. Essential hypertension    - Basic Metabolic Panel    4. Mixed hyperlipidemia    - Lipid panel  - Hepatic function panel (LFT)    5. Prediabetes    - Basic Metabolic Panel  - Hemoglobin A1C  - Hepatic function panel (LFT)    6. Rash of perineum    - nystatin (MYCOSTATIN) ointment; Apply topically 2 (two) times daily  Dispense: 30 g; Refill: 0  - Ambulatory referral to Dermatology; Future    7. Skin lesion    - Ambulatory referral to Dermatology; Future    8. Skin cancer screening    - Ambulatory referral to Dermatology; Future    Body mass index is 27.7 kg/m.        Dr Windell Moment MD  Internist  [facility] [place]  [address], [place],  [place] [id number]  [phone]  [phone]

## 2022-04-25 ENCOUNTER — Other Ambulatory Visit (INDEPENDENT_AMBULATORY_CARE_PROVIDER_SITE_OTHER): Payer: Self-pay | Admitting: Internal Medicine

## 2022-04-25 ENCOUNTER — Encounter (INDEPENDENT_AMBULATORY_CARE_PROVIDER_SITE_OTHER): Payer: Self-pay | Admitting: Internal Medicine

## 2022-04-25 DIAGNOSIS — R058 Other specified cough: Secondary | ICD-10-CM

## 2022-04-25 MED ORDER — AZITHROMYCIN 250 MG PO TABS
ORAL_TABLET | ORAL | 0 refills | Status: AC
Start: 2022-04-25 — End: 2022-05-01

## 2022-05-01 ENCOUNTER — Encounter (INDEPENDENT_AMBULATORY_CARE_PROVIDER_SITE_OTHER): Payer: Self-pay

## 2022-05-30 ENCOUNTER — Emergency Department: Payer: No Typology Code available for payment source

## 2022-05-30 ENCOUNTER — Telehealth (INDEPENDENT_AMBULATORY_CARE_PROVIDER_SITE_OTHER): Payer: Self-pay | Admitting: Internal Medicine

## 2022-05-30 ENCOUNTER — Ambulatory Visit (INDEPENDENT_AMBULATORY_CARE_PROVIDER_SITE_OTHER): Payer: No Typology Code available for payment source | Admitting: Emergency Medicine

## 2022-05-30 ENCOUNTER — Emergency Department
Admission: EM | Admit: 2022-05-30 | Discharge: 2022-05-30 | Disposition: A | Discharge status: Home / Self Care | Payer: No Typology Code available for payment source | Attending: Emergency Medicine | Admitting: Emergency Medicine

## 2022-05-30 ENCOUNTER — Encounter (INDEPENDENT_AMBULATORY_CARE_PROVIDER_SITE_OTHER): Payer: Self-pay | Admitting: Emergency Medicine

## 2022-05-30 VITALS — BP 161/94 | HR 72 | Temp 96.9°F | Resp 16 | Wt 164.0 lb

## 2022-05-30 DIAGNOSIS — R1032 Left lower quadrant pain: Secondary | ICD-10-CM

## 2022-05-30 DIAGNOSIS — M1612 Unilateral primary osteoarthritis, left hip: Secondary | ICD-10-CM | POA: Insufficient documentation

## 2022-05-30 DIAGNOSIS — M25552 Pain in left hip: Secondary | ICD-10-CM

## 2022-05-30 LAB — URINALYSIS WITH REFLEX TO MICROSCOPIC EXAM - REFLEX TO CULTURE
Bilirubin, UA: NEGATIVE
Blood, UA: NEGATIVE
Glucose, UA: NEGATIVE
Ketones UA: NEGATIVE
Leukocyte Esterase, UA: NEGATIVE
Nitrite, UA: NEGATIVE
Protein, UR: NEGATIVE
Specific Gravity UA: 1.007 (ref 1.001–1.035)
Urine pH: 7 (ref 5.0–8.0)
Urobilinogen, UA: NORMAL mg/dL (ref 0.2–2.0)

## 2022-05-30 LAB — CBC AND DIFFERENTIAL
Absolute NRBC: 0 10*3/uL (ref 0.00–0.00)
Basophils Absolute Automated: 0.04 10*3/uL (ref 0.00–0.08)
Basophils Automated: 0.8 %
Eosinophils Absolute Automated: 0.17 10*3/uL (ref 0.00–0.44)
Eosinophils Automated: 3.6 %
Hematocrit: 39.9 % (ref 34.7–43.7)
Hgb: 13 g/dL (ref 11.4–14.8)
Immature Granulocytes Absolute: 0.01 10*3/uL (ref 0.00–0.07)
Immature Granulocytes: 0.2 %
Instrument Absolute Neutrophil Count: 2.13 10*3/uL (ref 1.10–6.33)
Lymphocytes Absolute Automated: 1.76 10*3/uL (ref 0.42–3.22)
Lymphocytes Automated: 37.3 %
MCH: 28.8 pg (ref 25.1–33.5)
MCHC: 32.6 g/dL (ref 31.5–35.8)
MCV: 88.5 fL (ref 78.0–96.0)
MPV: 9.7 fL (ref 8.9–12.5)
Monocytes Absolute Automated: 0.61 10*3/uL (ref 0.21–0.85)
Monocytes: 12.9 %
Neutrophils Absolute: 2.13 10*3/uL (ref 1.10–6.33)
Neutrophils: 45.2 %
Nucleated RBC: 0 /100 WBC (ref 0.0–0.0)
Platelets: 354 10*3/uL — ABNORMAL HIGH (ref 142–346)
RBC: 4.51 10*6/uL (ref 3.90–5.10)
RDW: 14 % (ref 11–15)
WBC: 4.72 10*3/uL (ref 3.10–9.50)

## 2022-05-30 LAB — COMPREHENSIVE METABOLIC PANEL
ALT: 17 U/L (ref 0–55)
AST (SGOT): 19 U/L (ref 5–41)
Albumin/Globulin Ratio: 1.2 (ref 0.9–2.2)
Albumin: 4 g/dL (ref 3.5–5.0)
Alkaline Phosphatase: 88 U/L (ref 37–117)
Anion Gap: 7 (ref 5.0–15.0)
BUN: 11 mg/dL (ref 7.0–21.0)
Bilirubin, Total: 0.4 mg/dL (ref 0.2–1.2)
CO2: 27 mEq/L (ref 17–29)
Calcium: 9.3 mg/dL (ref 8.5–10.5)
Chloride: 104 mEq/L (ref 99–111)
Creatinine: 0.7 mg/dL (ref 0.4–1.0)
Globulin: 3.4 g/dL (ref 2.0–3.6)
Glucose: 90 mg/dL (ref 70–100)
Potassium: 3.8 mEq/L (ref 3.5–5.3)
Protein, Total: 7.4 g/dL (ref 6.0–8.3)
Sodium: 138 mEq/L (ref 135–145)
eGFR: >60 mL/min/{1.73_m2} (ref >=60)

## 2022-05-30 LAB — LIPASE: Lipase: 15 U/L (ref 8–78)

## 2022-05-30 MED ORDER — KETOROLAC TROMETHAMINE 30 MG/ML IJ SOLN
15.0000 mg | Freq: Once | INTRAMUSCULAR | Status: AC
Start: 2022-05-30 — End: 2022-05-30
  Administered 2022-05-30: 15 mg via INTRAVENOUS
  Filled 2022-05-30: qty 1

## 2022-05-30 NOTE — ED Provider Notes (Signed)
[facility] EMERGENCY DEPARTMENT HISTORY & PHYSICAL EXAM      Patient Information     Patient Name: Wendy [name], Wendy [name]  Encounter Date:  05/30/2022  Patient DOB:  May 26, 1955  MRN:  [id number]  Room:  H 4/H 4  Rendering Provider: Maryclare Labrador, PA    History of Presenting Illness     Chief Complaint: LLQ pain, L hip pain  History obtained from: patient    HPI Comments:   Wendy [name] is a 67 y.o. female with PMH of HTN and HLD presenting to the emergency department with a chief complaint of 1 week of left lower quadrant abdominal pain.  Patient reports that the pain began gradually and is now a 9/10.  She states that it hurts whenever she presses on the left side of her abdomen.  She denies any associated nausea, vomiting, diarrhea, dark or tarry stools, constipation, fevers, or chills.  She also denies any urinary symptoms including dysuria, hematuria, or urinary frequency.  She denies any radiation of pain to her left flank.  She states that yesterday, her left hip started to become tender as well.  She denies any gait instability, worsening pain with weightbearing, numbness, or weakness of the left lower extremity.  Prior abdominal surgical history includes cesarean section.      PMD: Mahlon Gammon, MD    Past Medical History     Past Medical History:   Diagnosis Date    Brain tumor (benign)     Hypercholesteremia     Hypertensive disorder        Past Surgical History     Past Surgical History:   Procedure Laterality Date    ARTHROPLASTY, KNEE, TOTAL MAKOPLASTY Left 08/03/2020    Procedure: ARTHROPLASTY, TOTAL, KNEE MAKOPLASTY;  Surgeon: Amado Nash, MD;  Location: [facility];  Service: Orthopedics;  Laterality: Left;    BRAIN SURGERY  2009    BREAST BIOPSY Right 2008    right breast biopsy- benign    BREAST BIOPSY Right 1970s    right breast tumor removed 40+ years ago - benign    BREAST CYST EXCISION  1970s    right breast tumor removed 40+ years  ago - benign    CESAREAN SECTION      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, CORNEAL IMPLANT Left     EYE SURGERY      KNEE SURGERY Left 08/03/2020    TUBAL LIGATION         Family History     Family History   Problem Relation Age of Onset    Diabetes Mother     Hypertension Mother     Diabetes Father     Hypertension Father     Breast cancer Neg Hx        Social History     Social History     Occupational History    Not on file   Tobacco Use    Smoking status: Never    Smokeless tobacco: Never   Vaping Use    Vaping Use: Never used   Substance and Sexual Activity    Alcohol use: Yes     Alcohol/week: 1.0 - 2.0 standard drink of alcohol     Types: 1 - 2 Shots of liquor per week    Drug use: No    Sexual activity: Not Currently     Partners: Male     Birth control/protection:  None       Allergies     Allergies   Allergen Reactions    Iodine Shortness Of Breath    Penicillins Shortness Of Breath     Has patient had a PCN reaction causing immediate rash, facial/tongue/throat swelling, SOB or lightheadedness with hypotension: Yes  Has patient had a PCN reaction causing severe rash involving mucus membranes or skin necrosis: No  Has patient had a PCN reaction that required hospitalization: No  Has patient had a PCN reaction occurring within the last 10 years: Yes  If all of the above answers are "NO", then may proceed with Cephalosporin use.    Shellfish-Derived Products Anaphylaxis    Atorvastatin      Joint pain     Morphine      Itchy     Tetanus Immune Globulin      Arm swelling     Zocor [Simvastatin]      No response        Home Medications     Prior to Admission medications    Medication Sig Start Date End Date Taking? Authorizing Provider   acetaZOLAMIDE (DIAMOX) 125 MG tablet Take 1 tablet (125 mg) by mouth once as needed (HA) 04/21/22   Mahlon Gammon, MD   aspirin EC 325 MG tablet Take 1 tablet (325 mg total) by mouth 2 (two) times daily  Patient taking differently: Take 1 tablet (325 mg) by mouth once  a week 08/04/20   Collene Gobble, MD   co-enzyme Q-10 50 MG capsule Take 1 capsule (50 mg) by mouth daily    [provider]   ezetimibe (ZETIA) 10 MG tablet Take 1 tablet (10 mg) by mouth daily 01/11/22   Mahlon Gammon, MD   fluticasone (FLONASE) 50 MCG/ACT nasal spray 1 spray by Nasal route daily 04/21/22   Mahlon Gammon, MD   hydrOXYzine (ATARAX) 10 MG tablet Take 1 tablet (10 mg) by mouth every 6 (six) hours as needed for Itching 11/02/21   Mahlon Gammon, MD   losartan (COZAAR) 25 MG tablet Take 1 tablet (25 mg) by mouth daily 01/11/22 07/10/22  Mahlon Gammon, MD   Multiple Vitamin (MULTIVITAMIN) tablet Take 1 tablet by mouth daily    [provider]   nortriptyline (PAMELOR) 10 MG capsule Take 1 capsule (10 mg) by mouth daily as needed (Headache) 04/21/22   Mahlon Gammon, MD   nystatin (MYCOSTATIN) ointment Apply topically 2 (two) times daily  Patient taking differently: Apply topically as needed 04/21/22   Mahlon Gammon, MD   prednisoLONE acetate (PRED FORTE) 1 % ophthalmic suspension Place 1 drop into the left eye 4 (four) times daily  Patient taking differently: Place 1 drop into the left eye 4 (four) times daily Twice weekly 06/30/20   Mahlon Gammon, MD   triamcinolone (KENALOG) 0.025 % ointment Apply topically 2 (two) times daily  Patient taking differently: Apply topically as needed 08/23/17   Bouhouch, [place]ak[place], MD         Review of Systems     Negative unless stated otherwise in HPI.      Physical Exam     Patient Vitals for the past 24 hrs:   BP Temp Temp src Pulse Resp SpO2 Weight   05/30/22 1313 195/82 97.7 F (36.5 C) Oral 65 16 100 % --   05/30/22 1311 -- -- -- -- -- -- 75.3 kg       Physical Exam  Constitutional:       Appearance:  Normal appearance.      Comments: Patient is well-appearing  No acute distress   HENT:      Head: Normocephalic and atraumatic.      Mouth/Throat:      Mouth: Mucous membranes are moist.    Eyes:      Conjunctiva/sclera: Conjunctivae normal.      Pupils: Pupils are equal, round, and reactive to light.   Cardiovascular:      Rate and Rhythm: Normal rate and regular rhythm.      Heart sounds: No murmur heard.  Pulmonary:      Effort: Pulmonary effort is normal. No respiratory distress.      Breath sounds: Normal breath sounds. No stridor. No wheezing, rhonchi or rales.   Abdominal:      General: Abdomen is flat. There is no distension.      Palpations: Abdomen is soft.      Comments: Mild to moderate tenderness to palpation in the left lower quadrant  No rebound or guarding  No CVA tenderness bilaterally  No distention   Musculoskeletal:         General: Normal range of motion.      Cervical back: Normal range of motion and neck supple.      Comments: Tenderness to palpation of left greater trochanter  No pain with rotation of left hip  No soft tissue swelling or decreased range of motion   Skin:     General: Skin is warm and dry.   Neurological:      General: No focal deficit present.      Mental Status: She is alert and oriented to person, place, and time.      Motor: No weakness.           Orders Placed During This Encounter     Orders Placed This Encounter   Procedures    CT Abd/Pelvis without Contrast    XR Hip left 2-3 vw with Pelvis    CBC and differential    Comprehensive metabolic panel    Lipase    Urinalysis Reflex to Microscopic Exam- Reflex to Culture    Saline lock IV       ED Medications Administered     ED Medication Orders (From admission, onward)      Start Ordered     Status Ordering Provider    05/30/22 1335 05/30/22 1334  ketorolac (TORADOL) injection 15 mg  Once        Route: Intravenous  Ordered Dose: 15 mg       Last MAR action: Given Nori Riis C            Medical Decision Making     Nurses notes including past history, allergies, vital signs reviewed.  Patient's home medications reviewed      BP 195/82   Pulse 65   Temp 97.7 F (36.5 C) (Oral)   Resp 16   Wt 75.3 kg    SpO2 100%   BMI 28.49 kg/m     Initial Medical Decision Making    This 67 year old female presents to the emergency department with a chief complaint of left lower quadrant abdominal pain for the last week without associated GI upset or fever.  She also complains of left hip pain that began yesterday without history of trauma.  Her exam is relatively benign as she is well-appearing and has left lower quadrant tenderness without peritoneal signs.  Suspect likely diverticulitis versus less likely kidney stone.  Lesser suspicion  for adnexal etiology given patient's age.  Patient's reported hip pain is likely a bursitis as she has tenderness to palpation only over the greater trochanter.  She does not have any pain with movement of the hip joint.  Her exam is not concerning for septic joint or occult fracture.  Plan to evaluate with x-ray.    Patient DONNABELLE RUPLINGER was seen and treated by me in the ED  Maryclare Labrador, [place]  05/30/2022 1:34 PM        Diagnostic Study Results and Data Review     The results of the diagnostic studies below were reviewed by the ED provider:    Labs  Results       Procedure Component Value Units Date/Time    Urinalysis Reflex to Microscopic Exam- Reflex to Culture [id number] Collected: 05/30/22 1437     Updated: 05/30/22 1448     Urine Type Urine, Clean Ca     Color, UA Colorless     Clarity, UA Clear     Specific Gravity UA 1.007     Urine pH 7.0     Leukocyte Esterase, UA Negative     Nitrite, UA Negative     Protein, UR Negative     Glucose, UA Negative     Ketones UA Negative     Urobilinogen, UA Normal mg/dL      Bilirubin, UA Negative     Blood, UA Negative    Lipase [id number] Collected: 05/30/22 1339    Specimen: Blood Updated: 05/30/22 1403     Lipase 15 U/L     Comprehensive metabolic panel [id number] Collected: 05/30/22 1339    Specimen: Blood Updated: 05/30/22 1403     Glucose 90 mg/dL      BUN 11.0 mg/dL      Creatinine 0.7 mg/dL      Sodium 138 mEq/L      Potassium 3.8 mEq/L       Chloride 104 mEq/L      CO2 27 mEq/L      Calcium 9.3 mg/dL      Protein, Total 7.4 g/dL      Albumin 4.0 g/dL      AST (SGOT) 19 U/L      ALT 17 U/L      Alkaline Phosphatase 88 U/L      Bilirubin, Total 0.4 mg/dL      Globulin 3.4 g/dL      Albumin/Globulin Ratio 1.2     Anion Gap 7.0     eGFR >60.0 mL/min/1.73 m2     CBC and differential [id number]  (Abnormal) Collected: 05/30/22 1339    Specimen: Blood Updated: 05/30/22 1346     WBC 4.72 x10 3/uL      Hgb 13.0 g/dL      Hematocrit 39.9 %      Platelets 354 x10 3/uL      RBC 4.51 x10 6/uL      MCV 88.5 fL      MCH 28.8 pg      MCHC 32.6 g/dL      RDW 14 %      MPV 9.7 fL      Instrument Absolute Neutrophil Count 2.13 x10 3/uL      Neutrophils 45.2 %      Lymphocytes Automated 37.3 %      Monocytes 12.9 %      Eosinophils Automated 3.6 %      Basophils Automated 0.8 %  Immature Granulocytes 0.2 %      Nucleated RBC 0.0 /100 WBC      Neutrophils Absolute 2.13 x10 3/uL      Lymphocytes Absolute Automated 1.76 x10 3/uL      Monocytes Absolute Automated 0.61 x10 3/uL      Eosinophils Absolute Automated 0.17 x10 3/uL      Basophils Absolute Automated 0.04 x10 3/uL      Immature Granulocytes Absolute 0.01 x10 3/uL      Absolute NRBC 0.00 x10 3/uL             Radiologic Studies  Radiology Results (24 Hour)       Procedure Component Value Units Date/Time    CT Abd/Pelvis without Contrast [[id number]] Collected: 05/30/22 1412    Order Status: Completed Updated: 05/30/22 1426    Narrative:      HISTORY:  Left flank pain for 2 weeks. Tenderness.    COMPARISON: None available.    TECHNIQUE: CT of the abdomen and pelvis performed without intravenous  contrast. The following dose reduction techniques were utilized: automated  exposure control and/or adjustment of the mA and/or KV according to patient  size, and the use of an iterative reconstruction technique.    CONTRAST: None.    FINDINGS: Vascular and organ detail is limited without IV contrast.    LINES/TUBES:  None.    LOWER THORAX:  No consolidation. No pleural effusion     LIVER/BILIARY TREE:  No mass or intrahepatic biliary dilatation.     GALL BLADDER: Non-inflamed. No radiodense calculi.     SPLEEN: No splenomegaly.    PANCREAS:  No pancreatic mass or duct dilatation.    KIDNEYS: No hydronephrosis, stones or solid mass lesions.    ADRENALS:  No adrenal mass.    PERITONEUM/RETROPERITONEUM: No ascites or pneumoperitoneum.    LYMPH NODES: No lymphadenopathy    VESSELS: No aortic aneurysm    GI TRACT:  A few colonic diverticula are seen. No CT imaging features of  active diverticulitis.  Small hiatal hernia. Moderate amount of colonic stool is present.    REPRODUCTIVE ORGANS:Uterine fibroids.    URINARY BLADDER: No mass seen.    BONES AND SOFT TISSUES:  Multilevel degenerative changes most pronounced at  L4-L5 with facet arthropathy.      Impression:          No acute abnormality to explain left lower abdominal pain.    No nephrolithiasis, hydronephrosis or imaging features of diverticulitis.    Pankaj El Salvador, MD  05/30/2022 2:24 PM    XR Hip left 2-3 vw with Pelvis [[id number]] Collected: 05/30/22 1409    Order Status: Completed Updated: 05/30/22 1412    Narrative:      HISTORY: Left hip pain.    COMPARISON: 09/18/2014.    FINDINGS:  No acute fracture is identified. The alignment is normal. There are  moderate degenerative changes of both hips, including joint space narrowing  and marginal osteophytes, especially arising from the acetabula. There are  degenerative changes of both sacroiliac joints, left greater than right,  including adjacent sclerosis and marginal osteophytes. Transitional anatomy  is seen at the lumbosacral junction, with the transitional level forming a  pseudoarthrosis with the sacrum on the left side. Degenerative changes of  the lumbar spine are incompletely evaluated. The visualized soft tissues  are unremarkable.      Impression:        1.Moderate degenerative changes of the left  hip.    2.Degenerative changes of  the right hip, lumbar spine, and sacroiliac  joints, incompletely evaluated on this exam.    Toy Baker, MD  05/30/2022 2:10 PM              Clinical Course     ED Course as of 05/30/22 1457   Tue May 30, 2022   1432 IMPRESSION:         No acute abnormality to explain left lower abdominal pain.     No nephrolithiasis, hydronephrosis or imaging features of diverticulitis.   [SY]   1308 UA not concerning for infection.  I updated patient on these results and advised her that while the exact cause of her pain is not entirely clear at this time, there does not appear to be any acute, emergent cause.  Instructed on management with Motrin and Tylenol as needed.  I feel she can be safely discharged home with plan for outpatient PCP follow-up.  I also provided her with orthopedic referral for follow-up of her atraumatic left hip pain and arthritis.  Return precautions reviewed at length.  She acknowledges understanding and is comfortable with this plan. [SY]      ED Course User Index  [SY] Maryclare Labrador, PA       Procedures       Procedures      Prescriptions       New Prescriptions    No medications on file       Diagnosis and Disposition     Clinical Impression:  1. Left lower quadrant pain    2. Arthritis of left hip      Final diagnoses:   Left lower quadrant pain   Arthritis of left hip       Disposition:  ED Disposition       ED Disposition   Discharge    Condition   --    Date/Time   Tue May 30, 2022  2:56 PM    Comment   IA LEEB discharge to home/self care.    Condition at disposition: Stable                   This note was generated by the [facility] EMR system/Dragon speech recognition and may contain inherent errors or omissions not intended by the user. Grammatical errors, random word insertions, deletions, pronoun errors and incomplete sentences are occasional consequences of this technology due to software limitations. Not all errors are caught or corrected. If there are  questions or concerns about the content of this note or information contained within the body of this dictation they should be addressed directly with the author for clarification.    NOTICE FOR THE PATIENT: This clinical note is not written in a way or designed to be interpreted by patients or persons who are not trained in clinical medicine, and we do not recommend reading it unless you have medical training. These notes may contain candid medical descriptions, which may be (unintentionally) perceived as offensive, which are sometimes required for accurate clinical documentation and medical decision-making. In addition, requests for modification of clinical documentation cannot be entertained (unless there is an error of fact). If you would like more information about your healthcare, please obtain it directly from myself or my medical and clinical colleagues and staff - never solely from hospital notes. Thank you for your understanding and cooperation       Maryclare Labrador, [place]  05/30/22 1457       Rosie Fate, MD  05/30/22 1804

## 2022-05-30 NOTE — Telephone Encounter (Signed)
Patient called requesting an appt with PCP for this week regarding uncontrolled BP and GI concerns. No appts available with PCP until March. Please advise if pt can be accommodated for a sooner appt with PCP. Please call pt back

## 2022-05-30 NOTE — ED Triage Notes (Signed)
Pt reports experiencing left flank, abdominal pain, 9/10 for the past week.

## 2022-05-30 NOTE — Progress Notes (Signed)
[facility]                       Date of Exam: 05/30/2022 12:57 PM        Patient ID: Wendy [name] is a 67 y.o. female.  Attending Physician: Faylene Million, MD        Chief Complaint:    Chief Complaint   Patient presents with    Pain     Left side, hip area x 3-4 days                HPI:    Wendy [name] comes in to the walk in clinic today for evaluation of basically two concerns.  She has occasional sciatica and was at gym over weekend and now experiences   Pain in the left hip, especially when sitting directly on it. No falls/trauma.  Her larger concern is a LLQ and sided pain which is dull, and some chills associated. She denies flank pain or and GU Sx associated.  No fevers, N/V. Last BM was yesterday and normal.  No ABD Sx, C-section many years ago only.  Pain is dull and mostly persistent with some increased level of intensity.          Problem List:    Patient Active Problem List   Diagnosis    Fuchs' corneal dystrophy    Status post corneal transplant    Pseudophakia of left eye    Nuclear sclerosis of right eye    Dry eyes, bilateral    Blepharitis of both eyes    post DSEK, OS    Refractive error    Corneal graft rejection    ERM OS (epiretinal membrane, left eye)    PVD (posterior vitreous detachment)    OC (onychocryptosis)    Acquired keratoderma    Primary osteoarthritis of left knee             Current Meds:    Outpatient Medications Marked as Taking for the 05/30/22 encounter (Office Visit) with Faylene Million, MD   Medication Sig Dispense Refill    acetaZOLAMIDE (DIAMOX) 125 MG tablet Take 1 tablet (125 mg) by mouth once as needed (HA) 30 tablet 5    aspirin EC 325 MG tablet Take 1 tablet (325 mg total) by mouth 2 (two) times daily (Patient taking differently: Take 1 tablet (325 mg) by mouth once a week) 30 tablet 0    co-enzyme Q-10 50 MG capsule Take 1 capsule (50 mg) by mouth daily      ezetimibe (ZETIA) 10 MG tablet Take 1 tablet (10 mg) by mouth  daily 90 tablet 4    fluticasone (FLONASE) 50 MCG/ACT nasal spray 1 spray by Nasal route daily 16 g 3    hydrOXYzine (ATARAX) 10 MG tablet Take 1 tablet (10 mg) by mouth every 6 (six) hours as needed for Itching 30 tablet 0    losartan (COZAAR) 25 MG tablet Take 1 tablet (25 mg) by mouth daily 90 tablet 4    Multiple Vitamin (MULTIVITAMIN) tablet Take 1 tablet by mouth daily      nortriptyline (PAMELOR) 10 MG capsule Take 1 capsule (10 mg) by mouth daily as needed (Headache) 30 capsule 5    nystatin (MYCOSTATIN) ointment Apply topically 2 (two) times daily (Patient taking differently: Apply topically as needed) 30 g 0    prednisoLONE acetate (PRED FORTE)  1 % ophthalmic suspension Place 1 drop into the left eye 4 (four) times daily (Patient taking differently: Place 1 drop into the left eye 4 (four) times daily Twice weekly) 10 mL 3    triamcinolone (KENALOG) 0.025 % ointment Apply topically 2 (two) times daily (Patient taking differently: Apply topically as needed) 80 g 1          Allergies:    Allergies   Allergen Reactions    Iodine Shortness Of Breath    Penicillins Shortness Of Breath     Has patient had a PCN reaction causing immediate rash, facial/tongue/throat swelling, SOB or lightheadedness with hypotension: Yes  Has patient had a PCN reaction causing severe rash involving mucus membranes or skin necrosis: No  Has patient had a PCN reaction that required hospitalization: No  Has patient had a PCN reaction occurring within the last 10 years: Yes  If all of the above answers are "NO", then may proceed with Cephalosporin use.    Shellfish-Derived Products Anaphylaxis    Atorvastatin      Joint pain     Morphine      Itchy     Tetanus Immune Globulin      Arm swelling     Zocor [Simvastatin]      No response              Past Surgical History:    Past Surgical History:   Procedure Laterality Date    ARTHROPLASTY, KNEE, TOTAL MAKOPLASTY Left 08/03/2020    Procedure: ARTHROPLASTY, TOTAL, KNEE MAKOPLASTY;   Surgeon: Amado Nash, MD;  Location: [facility];  Service: Orthopedics;  Laterality: Left;    BRAIN SURGERY  2009    BREAST BIOPSY Right 2008    right breast biopsy- benign    BREAST BIOPSY Right 1970s    right breast tumor removed 40+ years ago - benign    BREAST CYST EXCISION  1970s    right breast tumor removed 40+ years ago - benign    CESAREAN SECTION      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, CORNEAL IMPLANT Left     EYE SURGERY      KNEE SURGERY Left 08/03/2020    TUBAL LIGATION             Family History:    Family History   Problem Relation Age of Onset    Diabetes Mother     Hypertension Mother     Diabetes Father     Hypertension Father     Breast cancer Neg Hx            Social History:    Social History     Tobacco Use    Smoking status: Never    Smokeless tobacco: Never   Vaping Use    Vaping Use: Never used   Substance Use Topics    Alcohol use: Yes     Alcohol/week: 1.0 - 2.0 standard drink of alcohol     Types: 1 - 2 Shots of liquor per week    Drug use: No           The following sections were reviewed this encounter by the provider:   Allergies  Meds  Problems  Med Hx  Surg Hx  Fam Hx             Vital Signs:    BP (!) 161/94 (BP Site: Right arm, Patient Position: Sitting, Cuff Size: Large)  Pulse 72   Temp (!) 96.9 F (36.1 C) (Tympanic)   Resp 16   Wt 74.4 kg (164 lb)   BMI 28.15 kg/m          ROS:            Physical Exam:    Physical Exam   Constitutional: Vital signs reviewed. Well appearing.   Head: Normocephalic, atraumatic  Eyes: No conjunctival injection. No discharge.  Neck: Normal range of motion. Non-tender.  Respiratory/Chest: Clear to auscultation. No respiratory distress.   Cardiovascular: Normal rate. No murmur.    ABD: +BS, soft but there is TTP in LUQ and side w/o mass or rebound  No CVAT or suprapubic ttp noted  Back:  No CVA tenderness.  No spine tenderness.    EXT: FROM LLE, no sciatic ttp  There is left lateral and supra femoral ttp  Distal motor and  SILT intact  Neurological: A&Ox3, moving all four extremities symmetrically.    Skin: Warm and dry. No rash.  Psychiatric: Normal affect. Normal concentration.         Assessment:    1. Acute hip pain, left    2. LLQ abdominal pain            Plan:                Follow-up:    No follow-ups on file.         Faylene Million, MD     Addendum:  At 92, I spoke to Dr. Sherrian Divers at [facility] and discussed case and need for transfer to R/O diverticulitis.  Agrees for transfer and further W/U to follow.  Barnetta Chapel, MD

## 2022-05-30 NOTE — ED Provider Notes (Signed)
I have briefly evaluated this patient as triage physician in order to facilitate and initiate the ordering of laboratory and imaging studies as needed. Definitive care to be provided by another ED provider.    Chief Complaint   Patient presents with    Abdominal Pain     Llq ab pain ro diverticulitis. Coming from [facility]    Hip Pain        Brief HPI/MSE/MDM: sent from clinic: [facility] walkin.   Abd pain, sent for diverticulitis eval. Mainly on the left side of abd.   Iodine allergy.   9/10 currently.       Vitals:    05/30/22 1313   BP: 195/82   Pulse: 65   Resp: 16   Temp: 97.7 F (36.5 C)   SpO2: 100%         Orders Placed This Encounter    CT Abd/Pelvis without Contrast    XR Hip left 2-3 vw with Pelvis    CBC and differential    Comprehensive metabolic panel    Lipase    Urinalysis Reflex to Microscopic Exam- Reflex to Culture    Saline lock IV    ketorolac (TORADOL) injection 15 mg              Leodis Liverpool, MD     1:55 PM     Leodis Liverpool, MD  05/30/22 1355

## 2022-05-30 NOTE — Discharge Instructions (Addendum)
Dear Ms. Wendy [name]:    It was a pleasure to meet you and take care of you today!    Instructions:  Please follow-up with Dr. Lynnell Grain. Call today to make a follow-up appointment. Make sure to state that you were referred from the ER in order to be scheduled as soon as possible.     I also recommend follow-up with an orthopedist for your left hip pain.  A referral is included on the first page of this packet.    You may use 600mg  of motrin/ibuprofen/advil (these medications are all the same) every 6 hours as needed. You can also use 1000mg  of tylenol (acetaminophen) every 6 hours as well. Do not exceed 4000 mg of tylenol in a 24 hour period. It is safe to use both medications at the same time, as they do not interact. Make sure to eat prior to taking these medications so as not to upset your stomach.     Return to the Emergency Department for any new or worsening symptoms including but not limited to uncontrolled pain, vomiting, fever, or other new concerns.     Below is some information that our patients often find helpful.    We wish you good health and please do not hesitate to contact us if we can ever be of any assistance.    Sincerely,  Nori Riis, PA-C  [facility]    ________________________________________________________________    If you do not continue to improve or your condition worsens, please contact your doctor or return immediately to the Emergency Department.    Thank you for choosing [facility] for your emergency care needs.  We strive to provide EXCELLENT care to you and your family.      DOCTOR REFERRALS  Call [phone] if you need any further referrals and we can help you find a primary care doctor or specialist.  Also, available online at:  [email or web address]    YOUR CONTACT INFORMATION  Before leaving please check with registration to make sure we have an up-to-date contact number.  You can call registration at [phone] to update your information.  For questions about your hospital bill, please call [phone].  For questions about your Emergency Dept Physician bill please call [phone].      [place]  If you need help with health or social services, please call 2-1-1 for a free referral to resources in your area.  2-1-1 is a free service connecting people with information on health insurance, free clinics, pregnancy, mental health, dental care, food assistance, housing, and substance abuse counseling.  Also, available online at:  http://www.[facility].org    MEDICAL RECORDS AND TESTS  Certain laboratory test results do not come back the same day, for example urine cultures.   We will contact you if other important findings are noted.  Radiology films are often reviewed again to ensure accuracy.  If there is any discrepancy, we will notify you.      Please call [phone] to pick up a complimentary CD of any radiology studies performed.  If you or your doctor would like to request a copy of your medical records, please call [phone].      ORTHOPEDIC INJURY   Please know that significant injuries can exist even when an initial x-ray is read as normal or negative.  This can occur because some fractures (broken bones) are not initially visible on x-rays.  For this  reason, close outpatient follow-up with your primary care doctor or bone specialist (orthopedist) is required.    MEDICATIONS AND FOLLOWUP  Please be aware that some prescription medications can cause drowsiness.  Use caution when driving or operating machinery.    The examination and treatment you have received in our Emergency Department is provided on an emergency basis, and is not intended to be a substitute for your primary care physician.  It is important that your doctor checks you again and that you report any new or remaining problems at that time.      [place], [place], [place] [address] (1.4  miles, 7 minutes)  [place], [place], [place] [address] (6.5 miles, 13 minutes)  Handout with directions available on request

## 2022-05-31 ENCOUNTER — Encounter (INDEPENDENT_AMBULATORY_CARE_PROVIDER_SITE_OTHER): Payer: Self-pay | Admitting: Internal Medicine

## 2022-06-01 ENCOUNTER — Encounter (INDEPENDENT_AMBULATORY_CARE_PROVIDER_SITE_OTHER): Payer: Self-pay | Admitting: Internal Medicine

## 2022-06-01 ENCOUNTER — Ambulatory Visit (INDEPENDENT_AMBULATORY_CARE_PROVIDER_SITE_OTHER): Payer: No Typology Code available for payment source | Admitting: Internal Medicine

## 2022-06-01 ENCOUNTER — Encounter (INDEPENDENT_AMBULATORY_CARE_PROVIDER_SITE_OTHER): Payer: Self-pay

## 2022-06-01 VITALS — BP 132/79 | HR 73 | Temp 97.1°F | Resp 18 | Ht 64.0 in | Wt 164.8 lb

## 2022-06-01 DIAGNOSIS — K5900 Constipation, unspecified: Secondary | ICD-10-CM

## 2022-06-01 DIAGNOSIS — R109 Unspecified abdominal pain: Secondary | ICD-10-CM

## 2022-06-01 DIAGNOSIS — M1612 Unilateral primary osteoarthritis, left hip: Secondary | ICD-10-CM

## 2022-06-01 MED ORDER — MAGNESIUM CITRATE PO SOLN
296.0000 mL | Freq: Once | ORAL | 0 refills | Status: AC
Start: 2022-06-01 — End: 2022-06-01

## 2022-06-01 MED ORDER — MELOXICAM 7.5 MG PO TABS
7.5000 mg | ORAL_TABLET | Freq: Once | ORAL | 0 refills | Status: DC | PRN
Start: 2022-06-01 — End: 2022-11-29

## 2022-06-01 NOTE — Progress Notes (Signed)
Have you seen any specialists since your last visit with [place]?  No      The patient was informed that the following HM items are still outstanding:   Health Maintenance Due   Topic Date Due    Statin Use  Never done    MAMMOGRAM  07/13/2022

## 2022-06-01 NOTE — Progress Notes (Signed)
Chief Complaint   Patient presents with    ER Follow-up    Hypertension     67 year old, with history of hypertension, hypercholesterolemia who went to the emergency room on 05/30/2022 with left lower quadrant abdominal pain and left hip pain  I reviewed her ED visit, labs and imaging, blood work was insignificant, normal white count, lipase normal, liver enzymes normal, kidney test normal, urine analysis normal.  She had a CT of the abdomen and pelvis without IV contrast that showed multiple diverticuli but no diverticulitis, normal gallbladder normal liver normal spleen, normal pancreas normal kidneys with no stones.  But did show some stool  She also had an x-ray of the left hip that showed moderate osteoarthritis    She is here as a follow-up  She reports the pain on the left abdomen is not really a pain, she feels like a fizzy sensation, otherwise she has no blood in the stool, no diarrhea, no fevers or chills  I advised a course of laxative to see if she feels better, she also is up-to-date on the colonoscopy that has been normal    For the arthritis of the hip she is requesting referral for Ortho, that was placed today    She was also noted to have very high blood pressure in the emergency room, she is on losartan 25 mg daily, blood pressure is at goal today, she states her blood pressure is mostly 120s over 80s at home but at times it goes up to 160s over 100s, she usually follows a healthy lifestyle, I advised to continue the losartan 25 mg for now, and if her home blood pressure readings are elevated about 140s over 90s 3 or more consecutive times, to take extra 25 mg of losartan, she verbalized understanding        Past Medical History:   Diagnosis Date    Brain tumor (benign)     Hypercholesteremia     Hypertensive disorder       Past Surgical History:   Procedure Laterality Date    ARTHROPLASTY, KNEE, TOTAL MAKOPLASTY Left 08/03/2020    Procedure: ARTHROPLASTY, TOTAL, KNEE MAKOPLASTY;  Surgeon: Amado Nash, MD;  Location: [facility];  Service: Orthopedics;  Laterality: Left;    BRAIN SURGERY  2009    BREAST BIOPSY Right 2008    right breast biopsy- benign    BREAST BIOPSY Right 1970s    right breast tumor removed 40+ years ago - benign    BREAST CYST EXCISION  1970s    right breast tumor removed 40+ years ago - benign    CESAREAN SECTION      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, CORNEAL IMPLANT Left     EYE SURGERY      KNEE SURGERY Left 08/03/2020    TUBAL LIGATION        (Not in a hospital admission)    Current/Home Medications    ACETAZOLAMIDE (DIAMOX) 125 MG TABLET    Take 1 tablet (125 mg) by mouth once as needed (HA)    ASPIRIN EC 325 MG TABLET    Take 1 tablet (325 mg total) by mouth 2 (two) times daily    CO-ENZYME Q-10 50 MG CAPSULE    Take 1 capsule (50 mg) by mouth daily    EZETIMIBE (ZETIA) 10 MG TABLET    Take 1 tablet (10 mg) by mouth daily    FLUTICASONE (FLONASE) 50 MCG/ACT NASAL SPRAY  1 spray by Nasal route daily    HYDROXYZINE (ATARAX) 10 MG TABLET    Take 1 tablet (10 mg) by mouth every 6 (six) hours as needed for Itching    LOSARTAN (COZAAR) 25 MG TABLET    Take 1 tablet (25 mg) by mouth daily    MULTIPLE VITAMIN (MULTIVITAMIN) TABLET    Take 1 tablet by mouth daily    NORTRIPTYLINE (PAMELOR) 10 MG CAPSULE    Take 1 capsule (10 mg) by mouth daily as needed (Headache)    NYSTATIN (MYCOSTATIN) OINTMENT    Apply topically 2 (two) times daily    PREDNISOLONE ACETATE (PRED FORTE) 1 % OPHTHALMIC SUSPENSION    Place 1 drop into the left eye 4 (four) times daily    TRIAMCINOLONE (KENALOG) 0.025 % OINTMENT    Apply topically 2 (two) times daily     Allergies   Allergen Reactions    Iodine Shortness Of Breath    Penicillins Shortness Of Breath     Has patient had a PCN reaction causing immediate rash, facial/tongue/throat swelling, SOB or lightheadedness with hypotension: Yes  Has patient had a PCN reaction causing severe rash involving mucus membranes or skin necrosis: No  Has patient had a PCN  reaction that required hospitalization: No  Has patient had a PCN reaction occurring within the last 10 years: Yes  If all of the above answers are "NO", then may proceed with Cephalosporin use.    Shellfish-Derived Products Anaphylaxis    Atorvastatin      Joint pain     Morphine      Itchy     Tetanus Immune Globulin      Arm swelling     Zocor [Simvastatin]      No response       Social History     Tobacco Use    Smoking status: Never    Smokeless tobacco: Never   Substance Use Topics    Alcohol use: Yes     Alcohol/week: 1.0 - 2.0 standard drink of alcohol     Types: 1 - 2 Shots of liquor per week      Family History   Problem Relation Age of Onset    Diabetes Mother     Hypertension Mother     Diabetes Father     Hypertension Father     Breast cancer Neg Hx         Review of Systems   Constitutional:  Negative for chills and fever.   Gastrointestinal:  Positive for abdominal pain. Negative for blood in stool, constipation, diarrhea, melena, nausea and vomiting.   Musculoskeletal:  Positive for joint pain. Negative for falls.   Neurological: Negative.        Vitals:    06/01/22 1457   BP: 132/79   Pulse: 73   Resp: 18   Temp: 97.1 F (36.2 C)   SpO2: 100%     Physical Examination: General appearance - alert, well appearing, and in no distress  Mental status - alert, oriented to person, place, and time  Abdomen - soft, nontender, nondistended, no masses or organomegaly  no rebound tenderness noted  bowel sounds normal  Neurological - alert, oriented, normal speech, no focal findings or movement disorder noted  Musculoskeletal - left hip no joint tenderness, deformity or swelling, full range of motion without pain      Assessment/Plan:      1. Left lateral abdominal pain  Unclear, CT scan of the abdomen  and pelvis normal, no diverticulitis no kidney stone  Colonoscopy in the past normal  Questionable constipation, questionable muscle pull    2. Constipation, unspecified constipation type    - magnesium citrate  solution; Take 296 mLs by mouth once for 1 dose  Dispense: 296 mL; Refill: 0    3. Arthritis of left hip    - Referral to Orthopedic Surgery - EXTERNAL; Future  - meloxicam (MOBIC) 7.5 MG tablet; Take 1 tablet (7.5 mg) by mouth once as needed for Pain  Dispense: 30 tablet; Refill: 0    Body mass index is 28.29 kg/m.        Risk & Benefits of the new medication(s) were explained to the patient who verbalized understanding & agreed to the treatment plan        Dr Mahlon Gammon MD  Internist  [facility] [place]  [address], [place],  [place]  [phone]  [phone]

## 2022-06-16 ENCOUNTER — Encounter (INDEPENDENT_AMBULATORY_CARE_PROVIDER_SITE_OTHER): Payer: Self-pay | Admitting: Internal Medicine

## 2022-06-20 ENCOUNTER — Other Ambulatory Visit (INDEPENDENT_AMBULATORY_CARE_PROVIDER_SITE_OTHER): Payer: Self-pay | Admitting: Internal Medicine

## 2022-06-20 DIAGNOSIS — Z1231 Encounter for screening mammogram for malignant neoplasm of breast: Secondary | ICD-10-CM

## 2022-06-30 ENCOUNTER — Encounter (INDEPENDENT_AMBULATORY_CARE_PROVIDER_SITE_OTHER): Payer: Self-pay

## 2022-07-11 ENCOUNTER — Telehealth (INDEPENDENT_AMBULATORY_CARE_PROVIDER_SITE_OTHER): Payer: Self-pay | Admitting: Internal Medicine

## 2022-07-11 ENCOUNTER — Encounter (INDEPENDENT_AMBULATORY_CARE_PROVIDER_SITE_OTHER): Payer: Self-pay | Admitting: Internal Medicine

## 2022-07-11 ENCOUNTER — Other Ambulatory Visit (INDEPENDENT_AMBULATORY_CARE_PROVIDER_SITE_OTHER): Payer: Self-pay | Admitting: Internal Medicine

## 2022-07-11 DIAGNOSIS — Z20818 Contact with and (suspected) exposure to other bacterial communicable diseases: Secondary | ICD-10-CM

## 2022-07-11 MED ORDER — AZITHROMYCIN 250 MG PO TABS
ORAL_TABLET | ORAL | 0 refills | Status: AC
Start: 2022-07-11 — End: 2022-07-17

## 2022-07-11 NOTE — Telephone Encounter (Signed)
Patient called requesting an apapointment with Dr. Luiz Ochoa but already told her Dr. Luiz Ochoa has no opening until Thursday and she refused said cannot wait anymore as he had sore throat in weeks now. Pt stated her daughter has strep infection so she would like to be checked as well. Pt said she will send a message to Dr. Luiz Ochoa.

## 2022-07-19 ENCOUNTER — Ambulatory Visit: Payer: No Typology Code available for payment source

## 2022-07-26 ENCOUNTER — Ambulatory Visit
Admission: RE | Admit: 2022-07-26 | Discharge: 2022-07-26 | Disposition: A | Discharge status: Home / Self Care | Payer: No Typology Code available for payment source | Source: Ambulatory Visit | Attending: Internal Medicine | Admitting: Internal Medicine

## 2022-07-26 DIAGNOSIS — Z1231 Encounter for screening mammogram for malignant neoplasm of breast: Secondary | ICD-10-CM | POA: Insufficient documentation

## 2022-07-31 ENCOUNTER — Encounter (INDEPENDENT_AMBULATORY_CARE_PROVIDER_SITE_OTHER): Payer: Self-pay

## 2022-08-30 ENCOUNTER — Encounter (INDEPENDENT_AMBULATORY_CARE_PROVIDER_SITE_OTHER): Payer: Self-pay

## 2022-09-25 ENCOUNTER — Ambulatory Visit (INDEPENDENT_AMBULATORY_CARE_PROVIDER_SITE_OTHER): Payer: No Typology Code available for payment source | Admitting: Internal Medicine

## 2022-09-25 ENCOUNTER — Ambulatory Visit (INDEPENDENT_AMBULATORY_CARE_PROVIDER_SITE_OTHER): Payer: No Typology Code available for payment source

## 2022-09-25 ENCOUNTER — Encounter (INDEPENDENT_AMBULATORY_CARE_PROVIDER_SITE_OTHER): Payer: Self-pay

## 2022-09-25 VITALS — BP 146/89 | HR 76 | Temp 98.1°F | Resp 16 | Ht 64.0 in | Wt 165.0 lb

## 2022-09-25 DIAGNOSIS — R058 Other specified cough: Secondary | ICD-10-CM

## 2022-09-25 DIAGNOSIS — I10 Essential (primary) hypertension: Secondary | ICD-10-CM

## 2022-09-25 DIAGNOSIS — B9689 Other specified bacterial agents as the cause of diseases classified elsewhere: Secondary | ICD-10-CM

## 2022-09-25 DIAGNOSIS — J208 Acute bronchitis due to other specified organisms: Secondary | ICD-10-CM

## 2022-09-25 MED ORDER — AZITHROMYCIN 250 MG PO TABS
ORAL_TABLET | ORAL | 0 refills | Status: AC
Start: 2022-09-25 — End: 2022-09-30

## 2022-09-25 NOTE — Progress Notes (Signed)
[facility]  URGENT  CARE  PROGRESS NOTE     Patient: Wendy [name]   Date: 09/25/2022   MRN: [id number]       Wendy [name] is a 67 y.o. female      HISTORY     History obtained from: Patient    Chief Complaint   Patient presents with    nasal and chest congestion     Started with pink eye around 5/20. Congestion, runny nose, cough, lots of mucus. Yellow colored.         HPI 67 yo F with Hx of HTN and HLD, on meds.  C/O onset of conjunctivitis on 5-20, then NC, rhinorrhea and cough.  No F/C, N/V/D, SOB or CP.  COVID and Flu vaxxed.  No meds taken.  Did not take her BP med today.  "I don't feel well.  I need something.  I know my body and this has been going on for too long."    Review of Systems  All other systems reviewed and are negative.  As above.     History:    Pertinent Past Medical, Surgical, Family and Social History were reviewed.        Current Outpatient Medications:     acetaZOLAMIDE (DIAMOX) 125 MG tablet, Take 1 tablet (125 mg) by mouth once as needed (HA), Disp: 30 tablet, Rfl: 5    aspirin EC 325 MG tablet, Take 1 tablet (325 mg total) by mouth 2 (two) times daily (Patient taking differently: Take 1 tablet (325 mg) by mouth once a week), Disp: 30 tablet, Rfl: 0    co-enzyme Q-10 50 MG capsule, Take 1 capsule (50 mg) by mouth daily, Disp: , Rfl:     ezetimibe (ZETIA) 10 MG tablet, Take 1 tablet (10 mg) by mouth daily, Disp: 90 tablet, Rfl: 4    fluticasone (FLONASE) 50 MCG/ACT nasal spray, 1 spray by Nasal route daily (Patient taking differently: 1 spray by Nasal route as needed As needed), Disp: 16 g, Rfl: 3    hydrOXYzine (ATARAX) 10 MG tablet, Take 1 tablet (10 mg) by mouth every 6 (six) hours as needed for Itching, Disp: 30 tablet, Rfl: 0    Multiple Vitamin (MULTIVITAMIN) tablet, Take 1 tablet by mouth daily, Disp: , Rfl:     nortriptyline (PAMELOR) 10 MG capsule, Take 1 capsule (10 mg) by mouth daily as needed (Headache), Disp: 30 capsule, Rfl: 5    prednisoLONE acetate (PRED FORTE) 1 % ophthalmic  suspension, Place 1 drop into the left eye 4 (four) times daily, Disp: 10 mL, Rfl: 3    triamcinolone (KENALOG) 0.025 % ointment, Apply topically 2 (two) times daily, Disp: 80 g, Rfl: 1    losartan (COZAAR) 25 MG tablet, Take 1 tablet (25 mg) by mouth daily (Patient taking differently: Take 1 tablet (25 mg) by mouth daily In the morning.), Disp: 90 tablet, Rfl: 4    meloxicam (MOBIC) 7.5 MG tablet, Take 1 tablet (7.5 mg) by mouth once as needed for Pain, Disp: 30 tablet, Rfl: 0    nystatin (MYCOSTATIN) ointment, Apply topically 2 (two) times daily (Patient not taking: Reported on 06/01/2022), Disp: 30 g, Rfl: 0    Allergies   Allergen Reactions    Iodine Shortness Of Breath    Penicillins Shortness Of Breath     Has patient had a PCN reaction causing immediate rash, facial/tongue/throat swelling, SOB or lightheadedness with hypotension: Yes  Has patient had a PCN reaction causing severe rash  involving mucus membranes or skin necrosis: No  Has patient had a PCN reaction that required hospitalization: No  Has patient had a PCN reaction occurring within the last 10 years: Yes  If all of the above answers are "NO", then may proceed with Cephalosporin use.    Shellfish-Derived Products Anaphylaxis    Atorvastatin      Joint pain     Morphine      Itchy     Tetanus Immune Globulin      Arm swelling     Zocor [Simvastatin]      No response        Medications and Allergies reviewed.    PHYSICAL EXAM     Vitals:    09/25/22 1032   BP: 146/89   Pulse: 76   Resp: 16   Temp: 98.1 F (36.7 C)   SpO2: 96%   Weight: 74.8 kg (165 lb)   Height: 1.626 m (5\' 4" )       Physical Exam  Pulmonary:      Effort: Pulmonary effort is normal. No respiratory distress.      Breath sounds: Normal breath sounds. No stridor. No wheezing, rhonchi or rales.     Body mass index is 28.32 kg/m.  Vitals and nursing note reviewed.   Constitutional:       General: Not in acute distress.     Appearance: Normal appearance. Not ill-appearing or  toxic-appearing.   HENT:      Head: Normocephalic and atraumatic.   Eyes:      Conjunctiva/sclera: Conjunctivae normal.   Neck:      Musculoskeletal: Normal range of motion.   Respiratory:      Normal effort. Able to speak in full sentences.  Neurological:      Mental Status: Alert and oriented.  Psychiatric:         Mood and Affect: Mood normal.         Behavior: Behavior normal.       UCC COURSE     There were no labs reviewed with this patient during the visit.    X-Ray  The following X-ray studies were ordered, visualized and independently interpreted by me. Results were discussed with the patient/family.     XR Chest 2 Views    Result Date: 09/25/2022  Clinical History: Productive cough for 2 weeks. Comparison: 01/02/2020. Technique: PA and lateral views of the chest. Findings: Calcification and slight tortuosity of the thoracic aorta. Cardiac silhouette is not enlarged. Pulmonary vascularity is normal. No focal consolidation or pleural effusion.      No evidence of acute cardiopulmonary disease. Darra Lis, MD 09/25/2022 11:20 AM      No current facility-administered medications for this visit.       PROCEDURES     Procedures    MEDICAL DECISION MAKING     History, physical, labs/studies most consistent with Bacterial bronchitis as the diagnosis.    Coexisting comorbid conditions which increase the M/M of this encounter. (Discussion had about increased monitoring and close PCP follow up): HTN    Chart Review:  Prior PCP, Specialist and/or ED notes reviewed today: No  Prior labs/images/studies reviewed today: No    Differential Diagnosis: COVID 19; Flu; Acute viral syndrome; Strep; acute bronchitis - viral, bacterial, allergic.      ASSESSMENT     Encounter Diagnoses   Name Primary?    Productive cough Yes    Essential hypertension  PLAN      PLAN: Tylenol / ibuprofen; Push fluids; Nasal saline; Nasal steroid; Mucinex DM; F/U with PCP in 2-3 days.    Patient advised that this is a viral  infection running it's course.  She insisted that she needed a Z-pack.           Orders Placed This Encounter   Procedures    XR Chest 2 Views     Requested Prescriptions      No prescriptions requested or ordered in this encounter       Discussed results and diagnosis with patient/family.  Reviewed warning signs for worsening condition, as well as, indications for follow-up with primary care physician and return to urgent care clinic.   Patient/family expressed understanding of instructions.     An After Visit Summary was provided to the patient.

## 2022-09-25 NOTE — Patient Instructions (Addendum)
Take all prescriptions as directed.  I recommend a probiotic during and for 4-6 weeks after antibiotics.  Tylenol and or ibuprofen as directed on the bottles.  Nasal saline several times a day.  Nasal steroid.  Coricidin HBP.  Push fluids with electrolytes.  To the Emergency Room or call 911 for any chest pain, difficulty breathing, heart racing, any concern.  Follow up with your doctor in 2-3 days.

## 2022-09-26 ENCOUNTER — Telehealth (INDEPENDENT_AMBULATORY_CARE_PROVIDER_SITE_OTHER): Payer: No Typology Code available for payment source | Admitting: Internal Medicine

## 2022-09-30 ENCOUNTER — Encounter (INDEPENDENT_AMBULATORY_CARE_PROVIDER_SITE_OTHER): Payer: Self-pay

## 2022-10-18 ENCOUNTER — Other Ambulatory Visit (INDEPENDENT_AMBULATORY_CARE_PROVIDER_SITE_OTHER): Payer: Self-pay | Admitting: Internal Medicine

## 2022-10-30 ENCOUNTER — Encounter (INDEPENDENT_AMBULATORY_CARE_PROVIDER_SITE_OTHER): Payer: Self-pay

## 2022-11-21 ENCOUNTER — Encounter (INDEPENDENT_AMBULATORY_CARE_PROVIDER_SITE_OTHER): Payer: Self-pay | Admitting: Internal Medicine

## 2022-11-21 ENCOUNTER — Ambulatory Visit (FREE_STANDING_LABORATORY_FACILITY): Payer: No Typology Code available for payment source | Admitting: Internal Medicine

## 2022-11-21 VITALS — BP 129/73 | HR 88 | Temp 97.3°F | Resp 18 | Ht 64.0 in | Wt 167.0 lb

## 2022-11-21 DIAGNOSIS — E782 Mixed hyperlipidemia: Secondary | ICD-10-CM

## 2022-11-21 DIAGNOSIS — B3731 Acute candidiasis of vulva and vagina: Secondary | ICD-10-CM

## 2022-11-21 DIAGNOSIS — R350 Frequency of micturition: Secondary | ICD-10-CM

## 2022-11-21 DIAGNOSIS — N951 Menopausal and female climacteric states: Secondary | ICD-10-CM

## 2022-11-21 DIAGNOSIS — I10 Essential (primary) hypertension: Secondary | ICD-10-CM

## 2022-11-21 LAB — LIPID PANEL
Cholesterol / HDL Ratio: 4.6 Index
Cholesterol: 233 mg/dL — ABNORMAL HIGH (ref <=199)
HDL: 51 mg/dL (ref >=40)
LDL Calculated: 157 mg/dL — ABNORMAL HIGH (ref 0–129)
Triglycerides: 126 mg/dL (ref 34–149)
VLDL Calculated: 25 mg/dL (ref 10–40)

## 2022-11-21 LAB — POCT URINALYSIS DIPSTIX (10)(MULTI-TEST)
Bilirubin, UA POCT: NEGATIVE
Blood, UA POCT: NEGATIVE
Glucose, UA POCT: NEGATIVE
Ketones, UA POCT: NEGATIVE mg/dL
Nitrite, UA POCT: NEGATIVE
POCT Spec Gravity, UA: 1.01 (ref 1.001–1.035)
POCT pH, UA: 7 (ref 5–8)
Protein, UA POCT: NEGATIVE mg/dL
Urine Leukocytes POCT: NEGATIVE
Urobilinogen, UA: 0.2 mg/dL

## 2022-11-21 LAB — COMPREHENSIVE METABOLIC PANEL
ALT: 19 U/L (ref 0–55)
AST (SGOT): 22 U/L (ref 5–41)
Albumin/Globulin Ratio: 1.4 (ref 0.9–2.2)
Albumin: 3.8 g/dL (ref 3.5–5.0)
Alkaline Phosphatase: 78 U/L (ref 37–117)
Anion Gap: 9 (ref 5.0–15.0)
BUN: 12 mg/dL (ref 7–21)
Bilirubin, Total: 0.3 mg/dL (ref 0.2–1.2)
CO2: 27 mEq/L (ref 17–29)
Calcium: 9.2 mg/dL (ref 8.5–10.5)
Chloride: 103 mEq/L (ref 99–111)
Creatinine: 0.7 mg/dL (ref 0.4–1.0)
GFR: >60 mL/min/{1.73_m2} (ref >=60.0)
Globulin: 2.8 g/dL (ref 2.0–3.6)
Glucose: 90 mg/dL (ref 70–100)
Hemolysis Index: 4 Index
Potassium: 4.1 mEq/L (ref 3.5–5.3)
Protein, Total: 6.6 g/dL (ref 6.0–8.3)
Sodium: 139 mEq/L (ref 135–145)

## 2022-11-21 MED ORDER — ESTRADIOL 0.1 MG/GM VA CREA
0.5000 g | TOPICAL_CREAM | Freq: Every day | VAGINAL | 1 refills | Status: DC
Start: 2022-11-21 — End: 2023-03-20

## 2022-11-21 MED ORDER — FLUCONAZOLE 150 MG PO TABS
150.0000 mg | ORAL_TABLET | ORAL | 0 refills | Status: AC
Start: 2022-11-21 — End: 2022-11-26

## 2022-11-21 NOTE — Progress Notes (Signed)
Have you seen any specialists since your last visit with [place]?  No      The patient was informed that the following HM items are still outstanding:   Health Maintenance Due   Topic Date Due    Statin Use  Never done    Medicare Annual Wellness Visit  07/07/2022

## 2022-11-21 NOTE — Progress Notes (Signed)
Chief Complaint   Patient presents with    Vaginal Dryness     Dryness      Hypertension     Follow up    Labs Only     History of Chronic Hypertension  .The patient is compliant with anti-hypertensive therapy and denies side effects to therapy.  Pt denies CP, SOB, dizziness, orthopnea, PND or edema.  shechecks Blood pressure at home and is normal at times and sometimes 140s   Is tolerating meds without side effects.   Patient  is compliant with Medications  Advised that blood pressure values can change depending on the physiological status, advised that the goal blood pressure is equal to or less than 150/80, blood pressure today at goal, no change in the medication    Hx HLD  Compliant with  zetia   No Myalgias,  Muscle aches , bodyaches  Compliant with low saturated and low carbohydrate Diet  No hx abnormal liver test related to statin therapy     She is menopausal, admits to vaginal dryness and recently some vaginal itchiness, no discharge, she also admits to frequent urination, she feels she is not able to empty her urine completely  Point-of-care urine test showed no infection, advised urine culture as  On vaginal exam noted some discharge likely EXT, advised Diflucan, also advised Estrace vaginal cream per her request,  If she continues to have frequent urination or sensation of incomplete bladder emptying, advised to see gynecology as needed , she verbalized understanding      Medical History[1]   Past Surgical History:   Procedure Laterality Date    ARTHROPLASTY, KNEE, TOTAL MAKOPLASTY Left 08/03/2020    Procedure: ARTHROPLASTY, TOTAL, KNEE MAKOPLASTY;  Surgeon: Abe People, MD;  Location: [facility];  Service: Orthopedics;  Laterality: Left;    BRAIN SURGERY  2009    BREAST BIOPSY Right 2008    right breast biopsy- benign    BREAST BIOPSY Right 1970s    right breast tumor removed 40+ years ago - benign    BREAST CYST EXCISION  1970s    right breast tumor removed 40+ years ago - benign    CESAREAN  SECTION      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, CORNEAL IMPLANT Left     EYE SURGERY      KNEE SURGERY Left 08/03/2020    TUBAL LIGATION        Prescriptions Prior to Admission[2]  Current/Home Medications    ACETAZOLAMIDE (DIAMOX) 125 MG TABLET    Take 1 tablet (125 mg) by mouth once as needed (HA)    ASPIRIN EC 325 MG TABLET    Take 1 tablet (325 mg total) by mouth 2 (two) times daily    CO-ENZYME Q-10 50 MG CAPSULE    Take 1 capsule (50 mg) by mouth daily    EZETIMIBE (ZETIA) 10 MG TABLET    Take 1 tablet (10 mg) by mouth daily    FLUTICASONE (FLONASE) 50 MCG/ACT NASAL SPRAY    1 spray by Nasal route daily    HYDROXYZINE (ATARAX) 10 MG TABLET    Take 1 tablet (10 mg) by mouth every 6 (six) hours as needed for Itching    LOSARTAN (COZAAR) 25 MG TABLET    Take 1 tablet (25 mg) by mouth daily    MELOXICAM (MOBIC) 7.5 MG TABLET    Take 1 tablet (7.5 mg) by mouth once as needed for Pain    MULTIPLE  VITAMIN (MULTIVITAMIN) TABLET    Take 1 tablet by mouth daily    NEOMYCIN-POLYMYXIN-HYDROCORTISONE (CORTISPORIN) OTIC SOLUTION    INSTILL 3 DROPS INTO EACH EARS 3 TIMES A DAY FOR 10 DAYS    NORTRIPTYLINE (PAMELOR) 10 MG CAPSULE    Take 1 capsule (10 mg) by mouth daily as needed (Headache)    NYSTATIN (MYCOSTATIN) OINTMENT    Apply topically 2 (two) times daily    PREDNISOLONE ACETATE (PRED FORTE) 1 % OPHTHALMIC SUSPENSION    Place 1 drop into the left eye 4 (four) times daily    TRIAMCINOLONE (KENALOG) 0.025 % OINTMENT    Apply topically 2 (two) times daily     Allergies[3]   Social History     Tobacco Use    Smoking status: Never    Smokeless tobacco: Never   Substance Use Topics    Alcohol use: Yes     Alcohol/week: 1.0 - 2.0 standard drink of alcohol     Types: 1 - 2 Shots of liquor per week      Family History   Problem Relation Age of Onset    Diabetes Mother     Hypertension Mother     Diabetes Father     Hypertension Father     Breast cancer Neg Hx         Review of Systems   Constitutional:  Negative for chills  and fever.   Respiratory:  Negative for cough and shortness of breath.    Cardiovascular:  Negative for chest pain, palpitations and leg swelling.   Gastrointestinal: Negative.    Genitourinary:  Positive for frequency.        Frequent urination and sensation of incomplete emptying of the bladder, also admits to vaginal itching, no discharge, menopausal, also admits to vaginal dryness, no vaginal bleeding   Musculoskeletal:  Negative for myalgias.   Neurological:  Negative for dizziness, tremors, sensory change, speech change and headaches.     Vitals:    11/21/22 1349   BP: 129/73   Pulse: 88   Resp: 18   Temp: 97.3 F (36.3 C)   SpO2: 100%     Physical Examination: General appearance - alert, well appearing, and in no distress  Mental status - alert, oriented to person, place, and time  Neck - supple, no significant adenopathy  Chest - clear to auscultation, no wheezes, rales or rhonchi, symmetric air entry  Heart - normal rate, regular rhythm, normal S1, S2, no murmurs, rubs, clicks or gallops  Pelvic - VULVA: normal appearing vulva with no masses, tenderness or lesions, VAGINA: vaginal discharge - white, creamy, mucoid, odorless, and thick, CERVIX: normal appearing cervix without discharge or lesions, UTERUS: uterus is normal size, shape, consistency and nontender, no palpable internal organs  Neurological - alert, oriented, normal speech, no focal findings or movement disorder noted      Assessment/Plan:      1. Frequent urination    - POCT UA Dipstix (10)(Multi-Test)  - Culture, Urine; Future  - Culture, Urine    2. Essential hypertension    - Comprehensive Metabolic Panel; Future  - Comprehensive Metabolic Panel    3. Mixed hyperlipidemia    - Lipid Panel; Future  - Lipid Panel    4. Vaginal dryness, menopausal    - estradiol (ESTRACE) 0.1 MG/GM vaginal cream; Place 0.5 g vaginally daily  Dispense: 42.5 g; Refill: 1    5. Vaginal yeast infection    - fluconazole (DIFLUCAN) 150 MG tablet; Take  1 tablet (150  mg) by mouth every other day for 3 doses  Dispense: 3 tablet; Refill: 0    Body mass index is 28.67 kg/m.      Dr Windell Moment MD  Internist  [facility] [place]  [address], [place],  [place] [id number]  [phone]  Fax-[phone]               [1]   Past Medical History:  Diagnosis Date    Brain tumor (benign)     Hypercholesteremia     Hypertensive disorder    [2] (Not in a hospital admission)  [3]   Allergies  Allergen Reactions    Iodine Shortness Of Breath    Penicillins Shortness Of Breath     Has patient had a PCN reaction causing immediate rash, facial/tongue/throat swelling, SOB or lightheadedness with hypotension: Yes  Has patient had a PCN reaction causing severe rash involving mucus membranes or skin necrosis: No  Has patient had a PCN reaction that required hospitalization: No  Has patient had a PCN reaction occurring within the last 10 years: Yes  If all of the above answers are "NO", then may proceed with Cephalosporin use.    Shellfish-Derived Products Anaphylaxis    Atorvastatin      Joint pain     Morphine      Itchy     Tetanus Immune Globulin      Arm swelling     Zocor [Simvastatin]      No response

## 2022-11-27 ENCOUNTER — Encounter (INDEPENDENT_AMBULATORY_CARE_PROVIDER_SITE_OTHER): Payer: Self-pay | Admitting: Internal Medicine

## 2022-11-28 ENCOUNTER — Other Ambulatory Visit (INDEPENDENT_AMBULATORY_CARE_PROVIDER_SITE_OTHER): Payer: Self-pay | Admitting: Internal Medicine

## 2022-11-28 DIAGNOSIS — I10 Essential (primary) hypertension: Secondary | ICD-10-CM

## 2022-11-28 DIAGNOSIS — E782 Mixed hyperlipidemia: Secondary | ICD-10-CM

## 2022-11-29 ENCOUNTER — Encounter (INDEPENDENT_AMBULATORY_CARE_PROVIDER_SITE_OTHER): Payer: Self-pay

## 2022-11-29 ENCOUNTER — Ambulatory Visit (INDEPENDENT_AMBULATORY_CARE_PROVIDER_SITE_OTHER): Payer: No Typology Code available for payment source | Admitting: Family Medicine

## 2022-11-29 VITALS — BP 151/83 | HR 68 | Temp 98.0°F | Resp 16 | Ht 64.0 in | Wt 167.0 lb

## 2022-11-29 DIAGNOSIS — K5792 Diverticulitis of intestine, part unspecified, without perforation or abscess without bleeding: Secondary | ICD-10-CM

## 2022-11-29 MED ORDER — METRONIDAZOLE 500 MG PO TABS
500.0000 mg | ORAL_TABLET | Freq: Three times a day (TID) | ORAL | 0 refills | Status: AC
Start: 2022-11-29 — End: 2022-12-06

## 2022-11-29 MED ORDER — CIPROFLOXACIN HCL 500 MG PO TABS
500.0000 mg | ORAL_TABLET | Freq: Two times a day (BID) | ORAL | 0 refills | Status: AC
Start: 2022-11-29 — End: 2022-12-06

## 2022-11-29 NOTE — Progress Notes (Signed)
[facility] [place] [facility] [place] [facility]               Patient: Wendy [name]   Date of Service: 11/29/2022   MRN: [id number]       Wendy [name] is a 67 y.o. female      HISTORY     Chief Complaint   Patient presents with    Laceration     Left knee / 2 days    Abdominal Pain     LLQ / 3 days / hurts more when pressed on        Patient presents with:  Laceration: Left knee / 2 days  Abdominal Pain: LLQ / 3 days / hurts more when pressed on    BP 151/83 Comment: pt is in pain  Pulse 68   Temp 98 F (36.7 C)   Resp 16   Ht 1.626 m (5\' 4" )   Wt 75.8 kg (167 lb)   BMI 28.75 kg/m   67 year old female here for 2 issues she has abrasion on the knee likely look at.   Patient also with left lower quadrant pain about 8 out of 10 has been there for about 3 to 4 days improving no change in stools no nausea vomiting no fevers or chills patient believes she has a history of diverticulosis but a recent colonoscopy last year was normal.    Pt had a recent x-ray and CT scan please see results        Review of Systems   Constitutional: Negative.    HENT: Negative.     Respiratory: Negative.     Gastrointestinal:  Positive for abdominal pain. Negative for abdominal distention, anal bleeding, blood in stool, constipation, diarrhea, nausea and vomiting.       History:  Medical History[1]    Past Surgical History:   Procedure Laterality Date    ARTHROPLASTY, KNEE, TOTAL MAKOPLASTY Left 08/03/2020    Procedure: ARTHROPLASTY, TOTAL, KNEE MAKOPLASTY;  Surgeon: Abe People, MD;  Location: [facility];  Service: Orthopedics;  Laterality: Left;    BRAIN SURGERY  2009    BREAST BIOPSY Right 2008    right breast biopsy- benign    BREAST BIOPSY Right 1970s    right breast tumor removed 40+ years ago - benign    BREAST CYST EXCISION  1970s    right breast tumor removed 40+ years ago - benign    CESAREAN SECTION      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, CORNEAL IMPLANT Left     EYE SURGERY      KNEE SURGERY Left  08/03/2020    TUBAL LIGATION         Family History   Problem Relation Age of Onset    Diabetes Mother     Hypertension Mother     Diabetes Father     Hypertension Father     Breast cancer Neg Hx        Social History[2]    History reviewed.      Current Medications[3]    Allergies[4]    Medications and Allergies reviewed.    PHYSICAL EXAM     Vitals:    11/29/22 1350   BP: 151/83   Pulse: 68   Resp: 16   Temp: 98 F (36.7 C)   Weight: 75.8 kg (167 lb)   Height: 1.626 m (5\' 4" )       Physical Exam  Constitutional:  Appearance: Normal appearance.   HENT:      Mouth/Throat:      Mouth: Mucous membranes are moist.   Cardiovascular:      Rate and Rhythm: Normal rate and regular rhythm.      Pulses: Normal pulses.      Heart sounds: Normal heart sounds.   Pulmonary:      Effort: Pulmonary effort is normal.      Breath sounds: Normal breath sounds.   Abdominal:      General: There is no distension.      Tenderness: There is abdominal tenderness. There is no guarding.   Neurological:      Mental Status: She is alert.   Vitals reviewed.           UCC COURSE       Results       ** No results found for the last 24 hours. **              No results found.      No orders of the defined types were placed in this encounter.        PROCEDURES     Procedures       ASSESSMENT     No diagnosis found.       SSESSMENT    PLAN     Will treat empirically discussed with patient  .  Patient comfortable this and patient actually also believe she had diverticulitis.  She is not getting better though she will let us know.  But with the recent CT scan history of diverticulosis I think it makes sense to treat empirically.  Patient understands the ease when she can take when she is on these medicines etc.  Patient also take a probiotic while she is taking antibiotics    Discussed results and diagnosis with patient/family.  Reviewed warning signs for worsening condition, as well as, indications for follow-up with pmd and return to urgent  care clinic.   Patient/family expressed understanding of instructions.    No orders of the defined types were placed in this encounter.        An After Visit Summary was printed and given to the patient.      Signed,  Patsi Sears, MD             [1]   Past Medical History:  Diagnosis Date    Brain tumor (benign)     Hypercholesteremia     Hypertensive disorder    [2]   Social History  Tobacco Use    Smoking status: Never    Smokeless tobacco: Never   Vaping Use    Vaping status: Never Used   Substance Use Topics    Alcohol use: Yes     Alcohol/week: 1.0 - 2.0 standard drink of alcohol     Types: 1 - 2 Shots of liquor per week    Drug use: No   [3]   Current Outpatient Medications:     acetaZOLAMIDE (DIAMOX) 125 MG tablet, Take 1 tablet (125 mg) by mouth once as needed (HA), Disp: 30 tablet, Rfl: 5    aspirin EC 325 MG tablet, Take 1 tablet (325 mg total) by mouth 2 (two) times daily (Patient taking differently: Take 1 tablet (325 mg) by mouth once a week), Disp: 30 tablet, Rfl: 0    co-enzyme Q-10 50 MG capsule, Take 1 capsule (50 mg) by mouth daily, Disp: , Rfl:     estradiol (ESTRACE)  0.1 MG/GM vaginal cream, Place 0.5 g vaginally daily, Disp: 42.5 g, Rfl: 1    ezetimibe (ZETIA) 10 MG tablet, Take 1 tablet (10 mg) by mouth daily, Disp: 90 tablet, Rfl: 4    fluticasone (FLONASE) 50 MCG/ACT nasal spray, 1 spray by Nasal route daily (Patient taking differently: 1 spray by Nasal route as needed As needed), Disp: 16 g, Rfl: 3    hydrOXYzine (ATARAX) 10 MG tablet, Take 1 tablet (10 mg) by mouth every 6 (six) hours as needed for Itching, Disp: 30 tablet, Rfl: 0    Multiple Vitamin (MULTIVITAMIN) tablet, Take 1 tablet by mouth daily, Disp: , Rfl:     neomycin-polymyxin-hydrocortisone (CORTISPORIN) otic solution, INSTILL 3 DROPS INTO EACH EARS 3 TIMES A DAY FOR 10 DAYS, Disp: 10 mL, Rfl: 0    nortriptyline (PAMELOR) 10 MG capsule, Take 1 capsule (10 mg) by mouth daily as needed (Headache), Disp: 30 capsule, Rfl:  5    nystatin (MYCOSTATIN) ointment, Apply topically 2 (two) times daily, Disp: 30 g, Rfl: 0    prednisoLONE acetate (PRED FORTE) 1 % ophthalmic suspension, Place 1 drop into the left eye 4 (four) times daily, Disp: 10 mL, Rfl: 3    triamcinolone (KENALOG) 0.025 % ointment, Apply topically 2 (two) times daily, Disp: 80 g, Rfl: 1    losartan (COZAAR) 25 MG tablet, Take 1 tablet (25 mg) by mouth daily (Patient taking differently: Take 1 tablet (25 mg) by mouth daily In the morning.), Disp: 90 tablet, Rfl: 4  [4]   Allergies  Allergen Reactions    Iodine Shortness Of Breath    Penicillins Shortness Of Breath     Has patient had a PCN reaction causing immediate rash, facial/tongue/throat swelling, SOB or lightheadedness with hypotension: Yes  Has patient had a PCN reaction causing severe rash involving mucus membranes or skin necrosis: No  Has patient had a PCN reaction that required hospitalization: No  Has patient had a PCN reaction occurring within the last 10 years: Yes  If all of the above answers are "NO", then may proceed with Cephalosporin use.    Shellfish-Derived Products Anaphylaxis    Atorvastatin      Joint pain     Morphine      Itchy     Tetanus Immune Globulin      Arm swelling     Zocor [Simvastatin]      No response

## 2022-11-29 NOTE — Patient Instructions (Addendum)
Take it easy eating, ie bowel rest like we discussed    Cipro and Flagyl for one week if I am correct should feel better very quickly    If no changes should let us know as need to be seen again     No alcohol and take it easy with exercise as discussed

## 2022-12-05 ENCOUNTER — Ambulatory Visit (INDEPENDENT_AMBULATORY_CARE_PROVIDER_SITE_OTHER): Payer: No Typology Code available for payment source | Admitting: Cardiovascular Disease

## 2022-12-05 ENCOUNTER — Encounter (INDEPENDENT_AMBULATORY_CARE_PROVIDER_SITE_OTHER): Payer: Self-pay | Admitting: Cardiovascular Disease

## 2022-12-05 VITALS — BP 144/68 | HR 68 | Ht 64.0 in | Wt 162.0 lb

## 2022-12-05 DIAGNOSIS — R072 Precordial pain: Secondary | ICD-10-CM

## 2022-12-05 DIAGNOSIS — I10 Essential (primary) hypertension: Secondary | ICD-10-CM

## 2022-12-05 DIAGNOSIS — E782 Mixed hyperlipidemia: Secondary | ICD-10-CM

## 2022-12-05 LAB — ECG 12-LEAD
Atrial Rate: 68 {beats}/min
IHS MUSE NARRATIVE AND IMPRESSION: NORMAL
P Axis: 30 degrees
P-R Interval: 130 ms
Q-T Interval: 402 ms
QRS Duration: 82 ms
QTC Calculation (Bezet): 427 ms
R Axis: 52 degrees
T Axis: 67 degrees
Ventricular Rate: 68 {beats}/min

## 2022-12-05 MED ORDER — NEXLIZET 180-10 MG PO TABS
1.0000 | ORAL_TABLET | Freq: Every day | ORAL | 3 refills | Status: DC
Start: 2022-12-05 — End: 2023-07-03

## 2022-12-05 NOTE — Progress Notes (Signed)
[place] [facility]   CARDIOLOGY OFFICE CONSULTATION NOTE    [facility] OFFICE -CARDIOLOGY  [address]  [place] [address]  Dept: [phone]  Dept Fax: [phone]       Patient Name: Wendy [name]    Date of Visit:  December 05, 2022  Date of Birth: Oct 07, 1955  AGE: 67 y.o.  Medical Record #: [id number]              HISTORY OF PRESENT ILLNESS:    Ms. Starin is a 67 year old woman who is referred here today for evaluation of hyperlipidemia.  She states she was diagnosed with hypertension hyperlipidemia couple years ago.    She believes she was initially started on simvastatin and noted some neck itching.  This occurred if she took the simvastatin at the same time as her antihypertensive medication.  She states that she separated the 2 medications, she did not have the neck itching.    She states the simvastatin was subsidy changed to rosuvastatin.  Again, she noted neck itching if she took the rosuvastatin and her antihypertensive medications at the same time and did not have the neck itching when she separated the 2 medications.    Nonetheless, the rosuvastatin was subsidy discontinued and she was begun on ezetimibe approximately 1 year ago.    She is now referred here today for further evaluation of her hyperlipidemia.    She states she is quite active at home and occasionally goes to the gymnasium.  She usually goes there twice per week and does muscle toning exercises.    She states she is not able to walk on the treadmill or bicycle for long period of time due to those exercises exacerbating sciatica.    She denies any chest pain, palpitations, lightheadedness, dizziness, or syncope.    She denies any shortness of breath, paroxysmal nocturnal dyspnea or ankle edema.    She states she has been following her blood pressure at home and overall it has been under good control.    She states she did have a treadmill exercise tolerance test approximately 15 years ago  and was told it was normal.  She does not recall why the treadmill test was performed.            PAST MEDICAL HISTORY: She has a past medical history of Brain tumor (benign), Hypercholesteremia, and Hypertensive disorder. She has a past surgical history that includes Brain surgery (2009); Corneal transplant (2013); Breast biopsy (Right, 2008); Breast biopsy (Right, 1970s); Breast cyst excision (1970s); Eye surgery; Cesarean section; Tubal ligation; EXCHANGE, CORNEAL IMPLANT (Left); ARTHROPLASTY, KNEE, TOTAL MAKOPLASTY (Left, 08/03/2020); and Knee surgery (Left, 08/03/2020).    ALLERGIES: Allergies[1]    MEDICATIONS:   Current Outpatient Medications:     acetaZOLAMIDE (DIAMOX) 125 MG tablet, Take 1 tablet (125 mg) by mouth once as needed (HA), Disp: 30 tablet, Rfl: 5    aspirin EC 325 MG tablet, Take 1 tablet (325 mg total) by mouth 2 (two) times daily (Patient taking differently: Take 1 tablet (325 mg) by mouth once a week), Disp: 30 tablet, Rfl: 0    co-enzyme Q-10 50 MG capsule, Take 1 capsule (50 mg) by mouth daily, Disp: , Rfl:     ezetimibe (ZETIA) 10 MG tablet, Take 1 tablet (10 mg) by mouth daily, Disp: 90 tablet, Rfl: 4    fluticasone (FLONASE) 50 MCG/ACT nasal spray, 1 spray by Nasal route daily (Patient taking differently: 1  spray by Nasal route as needed As needed), Disp: 16 g, Rfl: 3    hydrOXYzine (ATARAX) 10 MG tablet, Take 1 tablet (10 mg) by mouth every 6 (six) hours as needed for Itching, Disp: 30 tablet, Rfl: 0    losartan (COZAAR) 25 MG tablet, Take 1 tablet (25 mg) by mouth daily (Patient taking differently: Take 1 tablet (25 mg) by mouth daily In the morning.), Disp: 90 tablet, Rfl: 4    Multiple Vitamin (MULTIVITAMIN) tablet, Take 1 tablet by mouth daily, Disp: , Rfl:     neomycin-polymyxin-hydrocortisone (CORTISPORIN) otic solution, INSTILL 3 DROPS INTO EACH EARS 3 TIMES A DAY FOR 10 DAYS, Disp: 10 mL, Rfl: 0    nortriptyline (PAMELOR) 10 MG capsule, Take 1 capsule (10 mg) by mouth daily as  needed (Headache), Disp: 30 capsule, Rfl: 5    nystatin (MYCOSTATIN) ointment, Apply topically 2 (two) times daily, Disp: 30 g, Rfl: 0    prednisoLONE acetate (PRED FORTE) 1 % ophthalmic suspension, Place 1 drop into the left eye 4 (four) times daily, Disp: 10 mL, Rfl: 3    triamcinolone (KENALOG) 0.025 % ointment, Apply topically 2 (two) times daily, Disp: 80 g, Rfl: 1    ciprofloxacin (Cipro) 500 MG tablet, Take 1 tablet (500 mg) by mouth 2 (two) times daily for 7 days (Patient not taking: Reported on 12/05/2022), Disp: 14 tablet, Rfl: 0    estradiol (ESTRACE) 0.1 MG/GM vaginal cream, Place 0.5 g vaginally daily (Patient not taking: Reported on 12/05/2022), Disp: 42.5 g, Rfl: 1    metroNIDAZOLE (FLAGYL) 500 MG tablet, Take 1 tablet (500 mg) by mouth 3 (three) times daily for 7 days (Patient not taking: Reported on 12/05/2022), Disp: 21 tablet, Rfl: 0     FAMILY HISTORY: family history includes Diabetes in her father and mother; Hyperlipidemia in her mother; Hypertension in her father and mother.    SOCIAL HISTORY: She reports that she has never smoked. She has never used smokeless tobacco. She reports current alcohol use of about 1.0 - 2.0 standard drink of alcohol per week. She reports that she does not use drugs.    PHYSICAL EXAMINATION    Visit Vitals  BP 144/68 (BP Site: Left arm, Patient Position: Sitting, Cuff Size: Large)   Pulse 68   Ht 1.626 m (5\' 4" )   Wt 73.5 kg (162 lb)   BMI 27.81 kg/m       General Appearance:  Well-appearing in no acute distress.    Skin: Warm and dry to touch.  Head: Normocephalic, no masses or tenderness   Eyes: EOMS Intact, PERRL, conjunctivae and lids unremarkable.  Funduscopic exam and visual fields not performed.   ENT: Ears, Nose and throat reveal no gross abnormalities.  No pallor or cyanosis.   Neck: JVP normal, no carotid bruits, thyroid not enlarged   Chest: Clear to auscultation bilaterally, normal respiratory effort, no wheezes, rales, or rhonchi   Cardiovascular: Regular  rhythm, S1 normal, S2 normal, No S3 or S4, Apical impulse not displaced, no murmurs, gallops or rubs  Abdomen: Soft, nontender, nondistended, with normoactive bowel sounds.  Extremities: Warm without edema, clubbing, or cyanosis. 2+ bilateral dorsalis pedis and tibial pulses.  Neuro: Alert and oriented. No gross motor or sensory deficits noted, affect appropriate.          ECG: Normal sinus rhythm, normal electrocardiogram      LABS:       Lab Results   Component Value Date    CHOL 233 (  H) 11/21/2022    CHOL 247 (H) 04/21/2022    CHOL 250 (H) 11/02/2021     Lab Results   Component Value Date    HDL 51 11/21/2022    HDL 55 04/21/2022    HDL 53 11/02/2021     Lab Results   Component Value Date    LDL 157 (H) 11/21/2022    LDL 176 (H) 04/21/2022    LDL 175 (H) 11/02/2021     Lab Results   Component Value Date    TRIG 126 11/21/2022    TRIG 79 04/21/2022    TRIG 109 11/02/2021               IMPRESSION:     Hyperlipidemia  Hypertension  No aortic aneurysm (sees abdominal/pelvis CT scan May 30, 2022)  Neck itching on simvastatin and rosuvastatin when taken concomitantly with antihypertensive medication      RECOMMENDATIONS:    Trial of changing ezetimibe to Bempedoic acid 180 mg-ezetimibe 10 mg (Nexlizet) daily  Dietary and exercise modification and follow-up fasting lipid profile and liver function test in approximately 3 months  Treadmill exercise tolerance test to evaluate for occult coronary insufficiency given her cardiovascular risk factors  Continue losartan for hypertension  Continue blood pressure monitoring home with diary keeping and follow-up with Dr. Larry Sierras  If her treadmill exercise tolerance test is unremarkable, coronary artery calcium scoring test to evaluate for atherosclerotic coronary artery disease  Pending the above, follow-up office visit in approximately 6-8 months        Comments:    I discussed above findings recommendations at length and in great detail with Ms. Scinta.  I did discuss  with her the potential clinical utility of coronary artery calcium scoring or coronary artery CTA to evaluate for atherosclerotic coronary artery disease, if her treadmill exercise tolerance test is unremarkable.    She states that she understands and would like to proceed as noted above.  Arrangements will be made for the above evaluation in the not-too-distant future.    Thank you very much for allowing me to participate in this very pleasant woman's care and I will be in contact with you as her evaluation continues.  In the meantime, please let me know if you have any questions.          SIGNED:    Kurtis Bushman, MD   [facility]          This note was generated by the Dragon speech recognition and may contain errors or omissions not intended by the user. Grammatical errors, random word insertions, deletions, pronoun errors, and incomplete sentences are occasional consequences of this technology due to software limitations. Not all errors are caught or corrected. If there are questions or concerns about the content of this note or information contained within the body of this dictation, they should be addressed directly with the author for clarification.         [1]   Allergies  Allergen Reactions    Iodine Shortness Of Breath    Penicillins Shortness Of Breath     Has patient had a PCN reaction causing immediate rash, facial/tongue/throat swelling, SOB or lightheadedness with hypotension: Yes  Has patient had a PCN reaction causing severe rash involving mucus membranes or skin necrosis: No  Has patient had a PCN reaction that required hospitalization: No  Has patient had a PCN reaction occurring within the last 10 years: Yes  If all of the above answers are "NO",  then may proceed with Cephalosporin use.    Shellfish-Derived Products Anaphylaxis    Atorvastatin      Joint pain     Morphine      Itchy     Tetanus Immune Globulin      Arm swelling     Zocor [Simvastatin]      No response

## 2022-12-06 ENCOUNTER — Telehealth (INDEPENDENT_AMBULATORY_CARE_PROVIDER_SITE_OTHER): Payer: Self-pay

## 2022-12-06 NOTE — Telephone Encounter (Signed)
Received a prior authorization for Nexlizet 180-10 mg from [facility] pharmacy. Key: [id number]. Gave the prior authorization to [facility].

## 2022-12-06 NOTE — Telephone Encounter (Signed)
Added to tracker

## 2022-12-06 NOTE — Telephone Encounter (Addendum)
Prior Authorization request for Nexlizet 180-10 mg #90:    Key:[id number]  CaseId:[id number]  Rx #: [id number]  Status:approved  Coverage End Date:04/24/2023    Update:pharmacy

## 2022-12-15 ENCOUNTER — Other Ambulatory Visit (FREE_STANDING_LABORATORY_FACILITY): Payer: No Typology Code available for payment source

## 2022-12-15 DIAGNOSIS — E782 Mixed hyperlipidemia: Secondary | ICD-10-CM

## 2022-12-15 LAB — HEPATIC FUNCTION PANEL (LFT)
ALT: 26 U/L (ref 0–55)
AST (SGOT): 25 U/L (ref 5–41)
Albumin/Globulin Ratio: 1.3 (ref 0.9–2.2)
Albumin: 3.9 g/dL (ref 3.5–5.0)
Alkaline Phosphatase: 70 U/L (ref 37–117)
Bilirubin Direct: 0.2 mg/dL (ref 0.0–0.5)
Bilirubin Indirect: 0.2 mg/dL (ref 0.2–1.0)
Bilirubin, Total: 0.4 mg/dL (ref 0.2–1.2)
Globulin: 2.9 g/dL (ref 2.0–3.6)
Hemolysis Index: 10 Index
Protein, Total: 6.8 g/dL (ref 6.0–8.3)

## 2022-12-15 LAB — LIPID PANEL
Cholesterol / HDL Ratio: 4.1 Index
Cholesterol: 201 mg/dL — ABNORMAL HIGH (ref <=199)
HDL: 49 mg/dL (ref >=40)
LDL Calculated: 130 mg/dL — ABNORMAL HIGH (ref 0–99)
Triglycerides: 111 mg/dL (ref 34–149)
VLDL Calculated: 22 mg/dL (ref 10–40)

## 2022-12-19 ENCOUNTER — Other Ambulatory Visit (INDEPENDENT_AMBULATORY_CARE_PROVIDER_SITE_OTHER): Payer: Self-pay | Admitting: Cardiovascular Disease

## 2022-12-19 ENCOUNTER — Ambulatory Visit (INDEPENDENT_AMBULATORY_CARE_PROVIDER_SITE_OTHER): Payer: No Typology Code available for payment source | Admitting: Cardiovascular Disease

## 2022-12-19 ENCOUNTER — Ambulatory Visit (INDEPENDENT_AMBULATORY_CARE_PROVIDER_SITE_OTHER): Payer: No Typology Code available for payment source

## 2022-12-19 DIAGNOSIS — R9439 Abnormal result of other cardiovascular function study: Secondary | ICD-10-CM

## 2022-12-19 DIAGNOSIS — E782 Mixed hyperlipidemia: Secondary | ICD-10-CM

## 2022-12-19 DIAGNOSIS — I10 Essential (primary) hypertension: Secondary | ICD-10-CM

## 2022-12-19 DIAGNOSIS — R072 Precordial pain: Secondary | ICD-10-CM

## 2022-12-19 LAB — CARDIAC STRESS TEST

## 2022-12-19 MED ORDER — PRAVASTATIN SODIUM 40 MG PO TABS
40.0000 mg | ORAL_TABLET | Freq: Every day | ORAL | 3 refills | Status: DC
Start: 2022-12-19 — End: 2023-03-20

## 2022-12-19 NOTE — Progress Notes (Signed)
Wendy [name] is here today for her treadmill exercise test.    She stated thatNexlizet is cost prohibitive to her.    I recommended a trial of pravastatin 40 mg each evening instead with a follow-up fasting lipid profile and liver function test in 3 months after beginning pravastatin.

## 2022-12-22 ENCOUNTER — Encounter (INDEPENDENT_AMBULATORY_CARE_PROVIDER_SITE_OTHER): Payer: Self-pay

## 2023-01-01 ENCOUNTER — Other Ambulatory Visit (INDEPENDENT_AMBULATORY_CARE_PROVIDER_SITE_OTHER): Payer: Self-pay

## 2023-01-01 NOTE — Telephone Encounter (Signed)
Received fax from [employer] requesting RX for nexlizet 180 tabs to begin PA for this med. Chart reviewed. Called patient, who reported that she never started taking her pravastatin recommend by Dr Cassell Smiles. She states that she got a grant to pay for nexlizet and wishes to take this moving forward. Informed her that I will give this paperwork to Dr Cassell Smiles to fill out for her RX. Patient v/u.

## 2023-01-03 ENCOUNTER — Encounter (INDEPENDENT_AMBULATORY_CARE_PROVIDER_SITE_OTHER): Payer: Self-pay | Admitting: Cardiovascular Disease

## 2023-01-03 ENCOUNTER — Ambulatory Visit
Admission: RE | Admit: 2023-01-03 | Discharge: 2023-01-03 | Disposition: A | Discharge status: Home / Self Care | Payer: No Typology Code available for payment source | Source: Ambulatory Visit | Attending: Cardiovascular Disease | Admitting: Cardiovascular Disease

## 2023-01-03 DIAGNOSIS — I10 Essential (primary) hypertension: Secondary | ICD-10-CM

## 2023-01-03 DIAGNOSIS — E785 Hyperlipidemia, unspecified: Secondary | ICD-10-CM

## 2023-01-03 DIAGNOSIS — R072 Precordial pain: Secondary | ICD-10-CM

## 2023-01-03 DIAGNOSIS — Z8249 Family history of ischemic heart disease and other diseases of the circulatory system: Secondary | ICD-10-CM

## 2023-01-03 DIAGNOSIS — R9439 Abnormal result of other cardiovascular function study: Secondary | ICD-10-CM | POA: Insufficient documentation

## 2023-01-03 LAB — NM MYOCARDIAL PERFUSION SPECT W STRESS AND REST: Stress LV Ejection Fraction: 68

## 2023-01-03 MED ORDER — TECHNETIUM TC 99M TETROFOSMIN IV KIT
35.0000 | PACK | Freq: Once | INTRAVENOUS | Status: AC | PRN
Start: 2023-01-03 — End: 2023-01-03
  Administered 2023-01-03: 35 via INTRAVENOUS
  Filled 2023-01-03: qty 100

## 2023-01-03 MED ORDER — TECHNETIUM TC 99M TETROFOSMIN IV KIT
10.0000 | PACK | Freq: Once | INTRAVENOUS | Status: AC | PRN
Start: 2023-01-03 — End: 2023-01-03
  Administered 2023-01-03: 10 via INTRAVENOUS
  Filled 2023-01-03: qty 100

## 2023-01-10 ENCOUNTER — Encounter (INDEPENDENT_AMBULATORY_CARE_PROVIDER_SITE_OTHER): Payer: Self-pay

## 2023-01-10 NOTE — Progress Notes (Addendum)
Faxed to [phone]. Placed original in [facility] fax accordion.

## 2023-01-20 ENCOUNTER — Other Ambulatory Visit (INDEPENDENT_AMBULATORY_CARE_PROVIDER_SITE_OTHER): Payer: Self-pay | Admitting: Internal Medicine

## 2023-01-20 DIAGNOSIS — I10 Essential (primary) hypertension: Secondary | ICD-10-CM

## 2023-02-06 ENCOUNTER — Ambulatory Visit
Admission: RE | Admit: 2023-02-06 | Discharge: 2023-02-06 | Disposition: A | Discharge status: Home / Self Care | Payer: No Typology Code available for payment source | Source: Ambulatory Visit | Attending: Cardiovascular Disease | Admitting: Cardiovascular Disease

## 2023-02-06 DIAGNOSIS — E782 Mixed hyperlipidemia: Secondary | ICD-10-CM | POA: Insufficient documentation

## 2023-02-06 DIAGNOSIS — I10 Essential (primary) hypertension: Secondary | ICD-10-CM | POA: Insufficient documentation

## 2023-02-06 DIAGNOSIS — R918 Other nonspecific abnormal finding of lung field: Secondary | ICD-10-CM | POA: Insufficient documentation

## 2023-02-06 DIAGNOSIS — I2510 Atherosclerotic heart disease of native coronary artery without angina pectoris: Secondary | ICD-10-CM | POA: Insufficient documentation

## 2023-02-14 ENCOUNTER — Encounter (INDEPENDENT_AMBULATORY_CARE_PROVIDER_SITE_OTHER): Payer: Self-pay | Admitting: Cardiovascular Disease

## 2023-03-12 ENCOUNTER — Encounter (INDEPENDENT_AMBULATORY_CARE_PROVIDER_SITE_OTHER): Payer: Self-pay | Admitting: Internal Medicine

## 2023-03-20 ENCOUNTER — Ambulatory Visit (INDEPENDENT_AMBULATORY_CARE_PROVIDER_SITE_OTHER): Payer: No Typology Code available for payment source | Admitting: Internal Medicine

## 2023-03-20 ENCOUNTER — Encounter (INDEPENDENT_AMBULATORY_CARE_PROVIDER_SITE_OTHER): Payer: Self-pay | Admitting: Internal Medicine

## 2023-03-20 VITALS — BP 117/72 | HR 71 | Temp 97.1°F | Resp 18 | Ht 64.0 in | Wt 159.4 lb

## 2023-03-20 DIAGNOSIS — I10 Essential (primary) hypertension: Secondary | ICD-10-CM

## 2023-03-20 DIAGNOSIS — R7303 Prediabetes: Secondary | ICD-10-CM

## 2023-03-20 DIAGNOSIS — E782 Mixed hyperlipidemia: Secondary | ICD-10-CM

## 2023-03-20 DIAGNOSIS — L84 Corns and callosities: Secondary | ICD-10-CM

## 2023-03-20 LAB — BASIC METABOLIC PANEL
Anion Gap: 6 (ref 5.0–15.0)
BUN: 14 mg/dL (ref 7–21)
CO2: 28 meq/L (ref 17–29)
Calcium: 9 mg/dL (ref 8.5–10.5)
Chloride: 105 meq/L (ref 99–111)
Creatinine: 0.7 mg/dL (ref 0.4–1.0)
GFR: >60 mL/min/{1.73_m2} (ref >=60.0)
Glucose: 93 mg/dL (ref 70–100)
Hemolysis Index: 3 {index}
Potassium: 3.9 meq/L (ref 3.5–5.3)
Sodium: 139 meq/L (ref 135–145)

## 2023-03-20 LAB — LIPID PANEL
Cholesterol / HDL Ratio: 3.7 {index}
Cholesterol: 213 mg/dL — ABNORMAL HIGH (ref <=199)
HDL: 57 mg/dL (ref >=40)
LDL Calculated: 145 mg/dL — ABNORMAL HIGH (ref 0–99)
Triglycerides: 56 mg/dL (ref 34–149)
VLDL Calculated: 11 mg/dL (ref 10–40)

## 2023-03-20 LAB — HEPATIC FUNCTION PANEL (LFT)
ALT: 30 U/L (ref <=55)
AST (SGOT): 41 U/L (ref <=41)
Albumin/Globulin Ratio: 1.4 (ref 0.9–2.2)
Albumin: 4 g/dL (ref 3.5–5.0)
Alkaline Phosphatase: 74 U/L (ref 37–117)
Bilirubin Direct: 0.2 mg/dL (ref 0.0–0.5)
Bilirubin Indirect: 0.2 mg/dL (ref 0.2–1.0)
Bilirubin, Total: 0.4 mg/dL (ref 0.2–1.2)
Globulin: 2.9 g/dL (ref 2.0–3.6)
Hemolysis Index: 3 {index}
Protein, Total: 6.9 g/dL (ref 6.0–8.3)

## 2023-03-20 LAB — HEMOGLOBIN A1C
Average Estimated Glucose: 116.9 mg/dL
Hemoglobin A1C: 5.7 % — ABNORMAL HIGH (ref 4.6–5.6)

## 2023-03-20 NOTE — Progress Notes (Signed)
Have you seen any specialists since your last visit with [place]?  No      The patient was informed that the following HM items are still outstanding:   Health Maintenance Due   Topic Date Due    Medicare Annual Wellness Visit  07/07/2022

## 2023-03-20 NOTE — Progress Notes (Signed)
Chief Complaint   Patient presents with    Toe Pain    Cholesterol Check     Hx HLD  Not on cholesterol medication  Saw cardiolony  Normal CT calcium score stress test   Advised repeat Lipid panel     History of Chronic Hypertension  .The patient is compliant with anti-hypertensive therapy and denies side effects to therapy.  Pt denies CP, SOB, dizziness, orthopnea, PND or edema.  shechecks Blood pressure at home and is normal   Is tolerating meds without side effects.   Patient  is compliant with Medications      Reports callus between toes right  foot requesting podiatry     Medical History[1]   Past Surgical History[2]   Prescriptions Prior to Admission[3]  Current/Home Medications    ACETAZOLAMIDE (DIAMOX) 125 MG TABLET    Take 1 tablet (125 mg) by mouth once as needed (HA)    ASPIRIN EC 325 MG TABLET    Take 1 tablet (325 mg total) by mouth 2 (two) times daily    BEMPEDOIC ACID-EZETIMIBE (NEXLIZET) 180-10 MG TAB    Take 1 tablet by mouth daily    CO-ENZYME Q-10 50 MG CAPSULE    Take 1 capsule (50 mg) by mouth daily    FLUTICASONE (FLONASE) 50 MCG/ACT NASAL SPRAY    1 spray by Nasal route daily    LOSARTAN (COZAAR) 25 MG TABLET    Take 1 tablet (25 mg) by mouth daily In the morning.    MULTIPLE VITAMIN (MULTIVITAMIN) TABLET    Take 1 tablet by mouth daily    NEOMYCIN-POLYMYXIN-HYDROCORTISONE (CORTISPORIN) OTIC SOLUTION    INSTILL 3 DROPS INTO EACH EARS 3 TIMES A DAY FOR 10 DAYS    NORTRIPTYLINE (PAMELOR) 10 MG CAPSULE    Take 1 capsule (10 mg) by mouth daily as needed (Headache)    NYSTATIN (MYCOSTATIN) OINTMENT    Apply topically 2 (two) times daily    PREDNISOLONE ACETATE (PRED FORTE) 1 % OPHTHALMIC SUSPENSION    Place 1 drop into the left eye 4 (four) times daily    TRIAMCINOLONE (KENALOG) 0.025 % OINTMENT    Apply topically 2 (two) times daily     Allergies[4]   Social History     Tobacco Use    Smoking status: Never    Smokeless tobacco: Never   Substance Use Topics    Alcohol use: Yes     Alcohol/week: 1.0  - 2.0 standard drink of alcohol     Types: 1 - 2 Shots of liquor per week      Family History[5]     Review of Systems   Constitutional:  Negative for chills and fever.   Respiratory:  Negative for cough, sputum production and shortness of breath.    Cardiovascular:  Negative for chest pain, palpitations, orthopnea and leg swelling.   Musculoskeletal:  Negative for falls and myalgias.        Callus of toes   Neurological:  Negative for dizziness, tremors, sensory change, focal weakness and headaches.           BP 117/72 (BP Site: Right arm, Patient Position: Sitting, Cuff Size: Medium)   Pulse 71   Temp 97.1 F (36.2 C) (Temporal)   Resp 18   Ht 1.626 m (5\' 4" )   Wt 72.3 kg (159 lb 6.4 oz)   SpO2 100%   BMI 27.36 kg/m   Wt Readings from Last 3 Encounters:   03/20/23 72.3 kg (159 lb  6.4 oz)   12/05/22 73.5 kg (162 lb)   11/29/22 75.8 kg (167 lb)           Mental status - alert, oriented to person, place, and time  HEENT:  Conjunctiva is clear.   Neck - supple, no cervical  Adenopathy,No carotid bruit  Chest - clear to auscultation, no wheezes, rales or rhonchi, symmetric air entry  Heart - normal rate, regular rhythm, normal S1, S2, no murmurs, rubs, clicks or gallops  Neurological - alert, oriented, normal speech, no focal findings o  Extremities - dorsalis pedis  pulses normal, no pedal edema, callus noted between 3rd nad 2nd toe Rt foot       Assessment/Plan:      1. Essential hypertension (Primary)    - Basic Metabolic Panel; Future  - Hemoglobin A1C; Future  - Lipid Panel; Future  - Hepatic Function Panel (LFT); Future  - Basic Metabolic Panel  - Hemoglobin A1C  - Lipid Panel  - Hepatic Function Panel (LFT)    2. Mixed hyperlipidemia    - Lipid Panel; Future  - Lipid Panel    3. Prediabetes    - Hemoglobin A1C; Future  - Hemoglobin A1C    4. Callus between toes    - Referral to Podiatry ([place]); Future    Body mass index is 27.36 kg/m.        Encouragement to Exercise      Mediterranean Diet: Fresh  fruits, vegetables, salads, chicken without the skin, fish (salmon). Minimize red meat. Avoid bread, white flour, cakes, pasta, sugar as best you can. If not allergic, a handful of walnuts or almonds a day is a good snack instead of a sugar snack.  Good oils are olive oil, canola oil. Try to minimize saturated fats (solid fats): For example butter. Non fat milk is better alternative to whole milk or 2% milk.   Exercise: 45 minutes, 4 times a week-alternating between Aerobic and strengthening.     Increase vegetables, fruits, and fiber in your diet.  Increase unsaturated fats - olive oil, canola oil, nuts, seeds, avocados, fish  Decrease saturated and trans fats - red meat, fried foods, butter, foods with hydrogenated oils.  Decreased processed carbohydrates and sugar - crackers, cookies, pretzels, white bread, white rice, regular pasta, etc.  Exercise at least 30 minutes 5 days per week.    Fish oil or omega-3 supplement (2-4 gm daily) is okay to take and may lower your triglycerides.        Dr Windell Moment MD  Internist  [facility]  [address], [place],  [place] [id number]  HK:[phone]  Fax-[phone]                           [1]   Past Medical History:  Diagnosis Date    Brain tumor (benign)     Hypercholesteremia     Hypertensive disorder    [2]   Past Surgical History:  Procedure Laterality Date    ARTHROPLASTY, KNEE, TOTAL MAKOPLASTY Left 08/03/2020    Procedure: ARTHROPLASTY, TOTAL, KNEE MAKOPLASTY;  Surgeon: Abe People, MD;  Location: [facility];  Service: Orthopedics;  Laterality: Left;    BRAIN SURGERY  2009    BREAST BIOPSY Right 2008    right breast biopsy- benign    BREAST BIOPSY Right 1970s    right breast tumor removed 40+ years ago - benign    BREAST  CYST EXCISION  1970s    right breast tumor removed 40+ years ago - benign    CESAREAN SECTION      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, CORNEAL IMPLANT Left     EYE SURGERY      KNEE SURGERY  Left 08/03/2020    TUBAL LIGATION     [3] (Not in a hospital admission)  [4]   Allergies  Allergen Reactions    Iodine Shortness Of Breath    Penicillins Shortness Of Breath     Has patient had a PCN reaction causing immediate rash, facial/tongue/throat swelling, SOB or lightheadedness with hypotension: Yes  Has patient had a PCN reaction causing severe rash involving mucus membranes or skin necrosis: No  Has patient had a PCN reaction that required hospitalization: No  Has patient had a PCN reaction occurring within the last 10 years: Yes  If all of the above answers are "NO", then may proceed with Cephalosporin use.    Shellfish-Derived Products Anaphylaxis    Atorvastatin      Joint pain     Morphine      Itchy     Tetanus Immune Globulin      Arm swelling     Zocor [Simvastatin]      No response    [5]   Family History  Problem Relation Name Age of Onset    Hyperlipidemia Mother      Diabetes Mother      Hypertension Mother      Diabetes Father      Hypertension Father      Breast cancer Neg Hx

## 2023-04-02 ENCOUNTER — Telehealth (INDEPENDENT_AMBULATORY_CARE_PROVIDER_SITE_OTHER): Payer: No Typology Code available for payment source | Admitting: Residents

## 2023-04-02 ENCOUNTER — Telehealth (INDEPENDENT_AMBULATORY_CARE_PROVIDER_SITE_OTHER): Payer: Self-pay | Admitting: Internal Medicine

## 2023-04-02 ENCOUNTER — Encounter (INDEPENDENT_AMBULATORY_CARE_PROVIDER_SITE_OTHER): Payer: Self-pay

## 2023-04-02 DIAGNOSIS — R2232 Localized swelling, mass and lump, left upper limb: Secondary | ICD-10-CM

## 2023-04-02 NOTE — Telephone Encounter (Signed)
Copied from CRM #[id number]. Topic: Appointment Scheduling - Schedule Appointment  >> Apr 02, 2023 12:31 PM Edward Qualia wrote:  Wendy [name] called about Appointment Scheduling - Schedule Appointment.  Additional details:  Pt call asking for cb from office regarding ,aking an apt; please advise

## 2023-04-02 NOTE — Progress Notes (Signed)
Pt is alone in the room. Pt is in [place]. Verified pt's driver's license.

## 2023-04-02 NOTE — Progress Notes (Signed)
[facility] Note  Virtual Visit  Date of Exam: 04/02/2023    Assessment:  1. Subcutaneous mass of left forearm  - Ultrasound wrist left MSK; Future    We discussed limitations of video visit (I can't examine the lesion).  Given the quick onset and lack of symptoms, it's probably a cyst.  Lipoma is a possibility.  Infection is unlikely.  Cancer is unlikely.    Plan:  -Check Ultrasound  -Follow-up with PCP in January      Levora Dredge, MD  __________________________________________    History:  Ms. Reasonover is a 67 y.o. yo female who presents for left forearm mass.    Last week noticed a bump on her wrist  Not painful  Uncomfortable when she presses on it    No trauma  She moved in the last few weeks, a lot of lifting boxes, does not remember trauma    No fevers, chills.    Her brother has a cyst in his shoulder that ended up being cancer.  Wants to have this checked out.    Vitals:  There were no vitals taken for this visit.    Physical Exam:  Limited by Virtual Visit:  Gen: NAD, appears comfortable sitting at home  HEENT: atraumatic  CV: appears well perfused  Pulm: comfortable WOB on RA, speaks full sentences  MSK: pt using arms to use computer; there is a subcutaneous nodule on the distal anterior left forearm.  She has full active ROM of the wrist.    Current Medications:  Medications Taking[1]      Telemedicine Eligibility:  State Location:  [x]  [place]  []  [place]  []  [address] [place] []  [place]  []  Other:    Physical Location:  [x]  Home  []  Office  []  Provider Office []  Hospital []  [facility] []  SNF  []  [facility] []  [facility] []  [facility]  []  [facility] []  Other:    Patient Identity Verification:  [x]  State Issued ID  []  Insurance Eligibility Check  []  Other:    Physical Address Verification: (for 911)  [x]  Yes  []  No    Personal identity credentials shared with patient:  [x]  Yes  []  No    Education on  nature of video visit shared with patient:  [x]  Yes  []  No    Emergency plan agreed upon with patient:  [x]  Yes  []  No    Visit terminated since not appropriate for virtual care:  [x]  N/A  []  Reason:       [1]   Outpatient Medications Marked as Taking for the 04/02/23 encounter (Telemedicine Visit) with Levora Dredge, MD   Medication Sig Dispense Refill    acetaZOLAMIDE (DIAMOX) 125 MG tablet Take 1 tablet (125 mg) by mouth once as needed (HA) 30 tablet 5    aspirin EC 325 MG tablet Take 1 tablet (325 mg total) by mouth 2 (two) times daily (Patient taking differently: Take 1 tablet (325 mg) by mouth as needed) 30 tablet 0    Bempedoic Acid-Ezetimibe (Nexlizet) 180-10 MG Tab Take 1 tablet by mouth daily 90 tablet 3    co-enzyme Q-10 50 MG capsule Take 1 capsule (50 mg) by mouth daily      fluticasone (FLONASE) 50 MCG/ACT nasal spray 1 spray by Nasal route daily 16 g 3    losartan (COZAAR) 25 MG tablet Take 1 tablet (25 mg) by mouth daily In  the morning. 90 tablet 4    Multiple Vitamin (MULTIVITAMIN) tablet Take 1 tablet by mouth daily      neomycin-polymyxin-hydrocortisone (CORTISPORIN) otic solution INSTILL 3 DROPS INTO EACH EARS 3 TIMES A DAY FOR 10 DAYS 10 mL 0    nortriptyline (PAMELOR) 10 MG capsule Take 1 capsule (10 mg) by mouth daily as needed (Headache) 30 capsule 5    nystatin (MYCOSTATIN) ointment Apply topically 2 (two) times daily 30 g 0    prednisoLONE acetate (PRED FORTE) 1 % ophthalmic suspension Place 1 drop into the left eye 4 (four) times daily 10 mL 3    triamcinolone (KENALOG) 0.025 % ointment Apply topically 2 (two) times daily 80 g 1

## 2023-04-03 ENCOUNTER — Other Ambulatory Visit (INDEPENDENT_AMBULATORY_CARE_PROVIDER_SITE_OTHER): Payer: Self-pay | Admitting: Residents

## 2023-04-03 ENCOUNTER — Ambulatory Visit (INDEPENDENT_AMBULATORY_CARE_PROVIDER_SITE_OTHER): Payer: No Typology Code available for payment source

## 2023-04-03 ENCOUNTER — Encounter (INDEPENDENT_AMBULATORY_CARE_PROVIDER_SITE_OTHER): Payer: Self-pay | Admitting: Foot & Ankle Surgery

## 2023-04-03 ENCOUNTER — Ambulatory Visit (INDEPENDENT_AMBULATORY_CARE_PROVIDER_SITE_OTHER): Payer: No Typology Code available for payment source | Admitting: Foot & Ankle Surgery

## 2023-04-03 VITALS — BP 133/80 | HR 74 | Ht 64.0 in | Wt 159.0 lb

## 2023-04-03 DIAGNOSIS — B351 Tinea unguium: Secondary | ICD-10-CM

## 2023-04-03 DIAGNOSIS — R2232 Localized swelling, mass and lump, left upper limb: Secondary | ICD-10-CM

## 2023-04-03 DIAGNOSIS — L84 Corns and callosities: Secondary | ICD-10-CM

## 2023-04-03 DIAGNOSIS — M2042 Other hammer toe(s) (acquired), left foot: Secondary | ICD-10-CM

## 2023-04-03 NOTE — Progress Notes (Signed)
Podiatric Surgery Patient Visit    Patient Name: Wendy [name],Wendy [name]  Assessment & Plan:   -Patient is 67 y.o. female   Assessment & Plan  1. Hammertoe, left foot -interdigital corn and callus to left first and second toe  Patient has significant pain and difficulty with shoe wear and has failed all efforts at conservative therapy over the last 6 months.  The patient has a large interdigital corn and callus on the medial side of her left second toe and lateral side of the left great toe, which are exquisitely painful and appear preulcerative. There is no significant varus or valgus deformity of the toes.    She is advised to continue wearing the toe spacer consistently to reduce friction and pressure. Soaking the feet regularly is recommended to alleviate symptoms.   As a courtesy these calluses were sharply debrided and removed today using a #15 scalpel.    An x-ray of the left foot will be ordered to further evaluate the condition, patient left today before we can get the x-ray done.  Plan for x-ray at her preop visit.     Surgical intervention to remove part of the bone in the second toe and the big toe is planned.  We discussed arthroplasty of the second toe PIP joint and partial excision of the base of the phalanx of the great toe bony prominence.  Risks and benefits of surgery and recovery time were discussed at length.  She will be seen one week prior to surgery to review the x-ray and finalize the surgical plan. Post-surgery, she will need to wear a dressing and a surgical shoe for two weeks until the stitches are removed. She is instructed to discontinue any blood thinners or anti-inflammatories five days prior to surgery to minimize bleeding risk.  She does not need preop labs.  No premedical clearance required.  She is a history of hypertension which is well-controlled.      Follow-up 1 week prior to surgery for preop visit.             Subjective:     Chief Complaint:   Chief Complaint   Patient presents with     Foot Problem     Left foot callus between great toe and second        HPI:  Wendy [name] is a 67 y.o. female.   History of Present Illness  The patient presents for a follow-up visit. She was last seen in February 2023. She is complaining of corns and calluses between her great toe and second toe of the left foot.    She reports experiencing pain in the same area that has been ongoing for the past 6 months. Despite using a toe spacer, she continues to experience a burning sensation. She has been performing regular callus care, but this has not provided any relief and seems to exacerbate the condition. She believes the issue may be related to a bone problem and is considering surgical intervention. She recalls a previous surgery on her toe for an ingrown nail. She typically wears athletic shoes and avoids high heels, but even tennis shoes cause discomfort. She does not consistently use the toe spacer. She also mentions that she used to wear coworker boots, which she believes may have contributed to the irritation due to their pointed toes. She has worn these boots approximately 4 or 5 times. She finds Uggs to be the only comfortable footwear. She has no history of diabetes. She reports no  heart or lung issues. She takes aspirin twice a week. She recently consulted her cardiologist due to high blood pressure and elevated cholesterol levels. Her blood pressure has since improved, and her cardiologist adjusted her cholesterol medication. She reports no cardiac issues.    SOCIAL HISTORY  She is retired.    ALLERGIES  The patient is allergic to IODINE, PENICILLIN, SHELLFISH, ATORVASTATIN, MORPHINE, TETANUS, and ZOCOR (SIMVASTATIN).    MEDICATIONS  Aspirin.           Denies N/V/F/C/SOB/CP  All other systems were reviewed and are negative  The following portions of the patient's history were reviewed and updated as appropriate: allergies, current medications, past medical history, past surgical history, family history,  and problem list.    Physical Exam:     Vitals:    04/03/23 1506   BP: 133/80   Pulse: 74       Gen: AAOx3, NAD, Well developed  HEENT: Normocephalic, EOMI  Heart: Regular pulse rate and rhythm  Lower extremity focused exam:   Physical Exam  There is a large interdigital corn and callus on the medial side of the left second toe and lateral side of the left great toe. These are exquisitely painful. They appear preulcerative, but no active ulceration or open lesion. There is no significant varus or valgus deformity of the toes. Pulses are strongly palpable. Capillary refill time is immediate to the toes. Neurovascular status is intact. Multiple nails are thickened and dystrophic. There are other areas of callus to the tips of the toes.        Radiology:     Xrays taken in clinic today: None today-will need x-rays at preop visit    Results             Claudette Head, DPM FACFAS  [facility]  Pager [phone]    ------------------------------------------------------------------------------------------------------------------------------  The review of the patient's medications does not in any way constitute an endorsement, by this clinician,  of their use, dosage, indications, route, efficacy, interactions, or other clinical parameters.    This note was generated within the EPIC EMR using Dragon medical speech recognition software and may contain inherent errors or omissions not intended by the user. Grammatical and punctuation errors, random word insertions, deletions, pronoun errors and incomplete sentences are occasional consequences of this technology due to software limitations. Not all errors are caught or corrected.  Although every attempt is made to root out erroneus and incomplete transcription, the note may still not fully represent the intent or opinion of the author. If there are questions or concerns about the content of this note or information contained within the body of this  dictation they should be addressed directly with the author for clarification.

## 2023-04-04 ENCOUNTER — Encounter (INDEPENDENT_AMBULATORY_CARE_PROVIDER_SITE_OTHER): Payer: Self-pay

## 2023-04-04 ENCOUNTER — Ambulatory Visit
Admission: RE | Admit: 2023-04-04 | Discharge: 2023-04-04 | Discharge status: Home / Self Care | Payer: No Typology Code available for payment source | Source: Ambulatory Visit | Attending: Residents

## 2023-04-04 ENCOUNTER — Telehealth (INDEPENDENT_AMBULATORY_CARE_PROVIDER_SITE_OTHER): Payer: Self-pay

## 2023-04-04 DIAGNOSIS — R2232 Localized swelling, mass and lump, left upper limb: Secondary | ICD-10-CM | POA: Insufficient documentation

## 2023-04-04 NOTE — Telephone Encounter (Signed)
Patient called wanting to know if she can come in for an xray because she did not get one yesterday.  I let her know the doctor did not want one, will get one prior to her surgery, voiced understanding and appreciation.

## 2023-04-04 NOTE — Telephone Encounter (Signed)
Called patient back and scheduled her for surgery on 2/17 with Dr. Dennie Fetters.

## 2023-04-06 ENCOUNTER — Other Ambulatory Visit (INDEPENDENT_AMBULATORY_CARE_PROVIDER_SITE_OTHER): Payer: Self-pay | Admitting: Cardiovascular Disease

## 2023-04-06 ENCOUNTER — Other Ambulatory Visit (INDEPENDENT_AMBULATORY_CARE_PROVIDER_SITE_OTHER): Payer: Self-pay | Admitting: Internal Medicine

## 2023-04-06 DIAGNOSIS — I10 Essential (primary) hypertension: Secondary | ICD-10-CM

## 2023-04-07 ENCOUNTER — Other Ambulatory Visit (INDEPENDENT_AMBULATORY_CARE_PROVIDER_SITE_OTHER): Payer: Self-pay | Admitting: Student in an Organized Health Care Education/Training Program

## 2023-04-07 DIAGNOSIS — L2084 Intrinsic (allergic) eczema: Secondary | ICD-10-CM

## 2023-04-09 ENCOUNTER — Encounter (INDEPENDENT_AMBULATORY_CARE_PROVIDER_SITE_OTHER): Payer: Self-pay

## 2023-04-09 NOTE — Telephone Encounter (Signed)
Last OV 03/20/2023    No Upcoming appt on file    Rx pended and please advise

## 2023-04-09 NOTE — Telephone Encounter (Signed)
Prior Authorization request for Nexlizet 180-10 mg #90:    Key:[id number]  CaseId:[id number]  Status:approved  Coverage End Date:10/01/2023    Update: mychart

## 2023-04-10 MED ORDER — TRIAMCINOLONE ACETONIDE 0.025 % EX OINT
TOPICAL_OINTMENT | Freq: Two times a day (BID) | CUTANEOUS | 1 refills | Status: AC
Start: 2023-04-10 — End: ?

## 2023-04-30 ENCOUNTER — Telehealth (INDEPENDENT_AMBULATORY_CARE_PROVIDER_SITE_OTHER): Payer: Self-pay

## 2023-04-30 NOTE — Telephone Encounter (Signed)
 Lvm letting patient know I got their voicemail and surgery is still scheduled for 2/17 and to upload her new insurance through [place] portal

## 2023-05-04 ENCOUNTER — Telehealth (INDEPENDENT_AMBULATORY_CARE_PROVIDER_SITE_OTHER): Payer: Self-pay

## 2023-05-04 NOTE — Telephone Encounter (Signed)
 Reached out to patient after I listened to her voicemail and let her know I returned her first call on 1/6 and to upload her new insurance card through [facility].

## 2023-05-08 ENCOUNTER — Encounter (INDEPENDENT_AMBULATORY_CARE_PROVIDER_SITE_OTHER): Payer: Self-pay

## 2023-05-09 ENCOUNTER — Telehealth (INDEPENDENT_AMBULATORY_CARE_PROVIDER_SITE_OTHER): Payer: Self-pay

## 2023-05-09 NOTE — Telephone Encounter (Signed)
 Left message for patient to upload their new insurance information so I can send case over to surgery center and start their auth for surgery.

## 2023-05-23 ENCOUNTER — Ambulatory Visit (INDEPENDENT_AMBULATORY_CARE_PROVIDER_SITE_OTHER): Payer: Medicare PPO | Admitting: Foot & Ankle Surgery

## 2023-05-31 ENCOUNTER — Ambulatory Visit (INDEPENDENT_AMBULATORY_CARE_PROVIDER_SITE_OTHER): Payer: Medicare PPO | Admitting: Internal Medicine

## 2023-05-31 ENCOUNTER — Encounter (INDEPENDENT_AMBULATORY_CARE_PROVIDER_SITE_OTHER): Payer: Self-pay | Admitting: Internal Medicine

## 2023-05-31 VITALS — BP 123/77 | HR 77 | Temp 97.5°F | Resp 18 | Ht 63.39 in | Wt 157.2 lb

## 2023-05-31 DIAGNOSIS — R52 Pain, unspecified: Secondary | ICD-10-CM

## 2023-05-31 DIAGNOSIS — U071 COVID-19: Secondary | ICD-10-CM

## 2023-05-31 DIAGNOSIS — R051 Acute cough: Secondary | ICD-10-CM

## 2023-05-31 DIAGNOSIS — R0981 Nasal congestion: Secondary | ICD-10-CM

## 2023-05-31 LAB — IHS AMB POCT SOFIA (TM) COVID-19 & FLU A/B
Sofia Influenza A Ag POCT: NEGATIVE
Sofia Influenza B Ag POCT: NEGATIVE
Sofia SARS COV2 Antigen POCT: POSITIVE — AB

## 2023-05-31 MED ORDER — BENZONATATE 100 MG PO CAPS
100.0000 mg | ORAL_CAPSULE | Freq: Three times a day (TID) | ORAL | 2 refills | Status: AC | PRN
Start: 2023-05-31 — End: 2023-08-29

## 2023-05-31 MED ORDER — NIRMATRELVIR&RITONAVIR 300/100 20 X 150 MG & 10 X 100MG PO TBPK
3.0000 | ORAL_TABLET | Freq: Two times a day (BID) | ORAL | 0 refills | Status: AC
Start: 2023-05-31 — End: 2023-06-05

## 2023-05-31 NOTE — Progress Notes (Signed)
 Have you seen any specialists since your last visit with us ?  No      The patient was informed that the following HM items are still outstanding:   Health Maintenance Due   Topic Date Due    Medicare Annual Wellness Visit  07/07/2022    PAP SMEAR  07/07/2023    MAMMOGRAM  07/26/2023

## 2023-05-31 NOTE — Progress Notes (Signed)
 Chief Complaint   Patient presents with    Muscle/joint Pain    Fatigue    Nasal Congestion    Cough    Generalized Body Aches    Chills   68 year old with history of hypertension, hypercholesterolemia  She reports to 2 days of fatigue with bodyaches, joint pain, cough with some productive phlegm, and chills with nasal congestion and runny nose, no fevers or chills, no diarrhea, no nausea or vomiting, no travel no sick contact    She tested positive for rapid COVID here      Medical History[1]   Past Surgical History[2]   Prescriptions Prior to Admission[3]  Current/Home Medications    ACETAZOLAMIDE  (DIAMOX ) 125 MG TABLET    Take 1 tablet (125 mg) by mouth once as needed (HA)    ASPIRIN  EC 325 MG TABLET    Take 1 tablet (325 mg total) by mouth 2 (two) times daily    BEMPEDOIC ACID-EZETIMIBE  (NEXLIZET ) 180-10 MG TAB    Take 1 tablet by mouth daily    CO-ENZYME Q-10 50 MG CAPSULE    Take 1 capsule (50 mg) by mouth daily    FLUTICASONE  (FLONASE ) 50 MCG/ACT NASAL SPRAY    1 spray by Nasal route daily    LOSARTAN  (COZAAR ) 25 MG TABLET    Take 1 tablet (25 mg) by mouth daily In the morning.    MULTIPLE VITAMIN (MULTIVITAMIN) TABLET    Take 1 tablet by mouth daily    NORTRIPTYLINE  (PAMELOR ) 10 MG CAPSULE    Take 1 capsule (10 mg) by mouth daily as needed (Headache)    NYSTATIN  (MYCOSTATIN ) OINTMENT    Apply topically 2 (two) times daily    PREDNISOLONE  ACETATE (PRED FORTE ) 1 % OPHTHALMIC SUSPENSION    Place 1 drop into the left eye 4 (four) times daily    TRIAMCINOLONE  (KENALOG ) 0.025 % OINTMENT    Apply topically 2 (two) times daily     Allergies[4]   Social History     Tobacco Use    Smoking status: Never    Smokeless tobacco: Never   Substance Use Topics    Alcohol use: Yes     Alcohol/week: 1.0 - 2.0 standard drink of alcohol     Types: 1 - 2 Shots of liquor per week      Family History[5]         Review of Systems   Constitutional:  Positive for malaise/fatigue. Negative for chills and fever.   HENT:  Positive for  congestion and sore throat. Negative for ear discharge, ear pain and sinus pain.    Respiratory:  Positive for cough and sputum production. Negative for wheezing.    Cardiovascular:  Negative for chest pain.   Gastrointestinal:  Negative for diarrhea, nausea and vomiting.     Vitals:    05/31/23 1520   BP: 123/77   Pulse: 77   Resp: 18   Temp: 97.5 F (36.4 C)   SpO2: 98%       Physical Examination: General appearance - alert, well appearing, and in no distress  Mental status - alert, oriented to person, place, and time  Ears - bilateral TM's and external ear canals normal  Nose - mucosal congestion and mucosal erythema  Mouth - mucous membranes moist, pharynx normal without lesions  Neck - supple, no significant adenopathy  Chest - clear to auscultation, no wheezes, rales or rhonchi, symmetric air entry  Heart - normal rate, regular rhythm, normal S1, S2, no murmurs,  rubs, clicks or gallops  Neurological - alert, oriented, normal speech, no focal findings or movement disorder noted      Assessment/Plan:      1. COVID-19 virus RNA test result positive at limit of detection (Primary)    - nirmatrelvir -ritonavir  (PAXLOVID ) 20 x 150 MG & 10 x 100MG  dose pack(emergency use authorization); Take 3 tablets by mouth 2 (two) times daily for 5 days The dosage for PAXLOVID  is 300 mg nirmatrelvir  (two 150 mg tablets) with 100 mg ritonavir  (one 100 mg tablet) with all three tablets taken together.  Dispense: 30 tablet; Refill: 0    2. Congestion of paranasal sinus    - Sofia(TM) SARS COVID19 & Flu A/B POCT; Future  - Sofia(TM) SARS COVID19 & Flu A/B POCT    3. Acute cough    - Sofia(TM) SARS COVID19 & Flu A/B POCT; Future  - benzonatate  (TESSALON ) 100 MG capsule; Take 1 capsule (100 mg) by mouth 3 (three) times daily as needed for Cough  Dispense: 30 capsule; Refill: 2  - Sofia(TM) SARS COVID19 & Flu A/B POCT    4. Body aches    - Sofia(TM) SARS COVID19 & Flu A/B POCT; Future  - Sofia(TM) SARS COVID19 & Flu A/B POCT    Body  mass index is 27.51 kg/m.        Risk & Benefits of the new medication(s) were explained to the patient who verbalized understanding & agreed to the treatment plan        Dr Kaleta Hoes MD  Internist  [facility]  [address], [place],  [place] [id number]  Ey:[phone]  [phone]               [1]   Past Medical History:  Diagnosis Date    Brain tumor (benign)     Hypercholesteremia     Hypertensive disorder    [2]   Past Surgical History:  Procedure Laterality Date    ARTHROPLASTY, KNEE, TOTAL MAKOPLASTY Left 08/03/2020    Procedure: ARTHROPLASTY, TOTAL, KNEE MAKOPLASTY;  Surgeon: Nivia Sandralee NOVAK, MD;  Location: [place] [facility];  Service: Orthopedics;  Laterality: Left;    BRAIN SURGERY  2009    BREAST BIOPSY Right 2008    right breast biopsy- benign    BREAST BIOPSY Right 1970s    right breast tumor removed 40+ years ago - benign    BREAST CYST EXCISION  1970s    right breast tumor removed 40+ years ago - benign    CESAREAN SECTION      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, CORNEAL IMPLANT Left     EYE SURGERY      KNEE SURGERY Left 08/03/2020    TUBAL LIGATION     [3] (Not in a hospital admission)  [4]   Allergies  Allergen Reactions    Iodine Shortness Of Breath    Penicillins Shortness Of Breath     Has patient had a PCN reaction causing immediate rash, facial/tongue/throat swelling, SOB or lightheadedness with hypotension: Yes  Has patient had a PCN reaction causing severe rash involving mucus membranes or skin necrosis: No  Has patient had a PCN reaction that required hospitalization: No  Has patient had a PCN reaction occurring within the last 10 years: Yes  If all of the above answers are NO, then may proceed with Cephalosporin use.    Shellfish-Derived Products Anaphylaxis    Atorvastatin       Joint pain  Morphine      Itchy     Tetanus Immune Globulin      Arm swelling     Zocor  [Simvastatin ]      No response    [5]   Family History  Problem  Relation Name Age of Onset    Hyperlipidemia Mother      Diabetes Mother      Hypertension Mother      Diabetes Father      Hypertension Father      Breast cancer Neg Hx

## 2023-06-01 ENCOUNTER — Encounter (INDEPENDENT_AMBULATORY_CARE_PROVIDER_SITE_OTHER): Payer: Self-pay | Admitting: Foot & Ankle Surgery

## 2023-06-05 ENCOUNTER — Encounter (INDEPENDENT_AMBULATORY_CARE_PROVIDER_SITE_OTHER): Payer: Self-pay | Admitting: Foot & Ankle Surgery

## 2023-06-05 ENCOUNTER — Ambulatory Visit (INDEPENDENT_AMBULATORY_CARE_PROVIDER_SITE_OTHER): Payer: Medicare PPO | Admitting: Foot & Ankle Surgery

## 2023-06-05 ENCOUNTER — Ambulatory Visit (INDEPENDENT_AMBULATORY_CARE_PROVIDER_SITE_OTHER): Payer: Medicare PPO

## 2023-06-05 VITALS — BP 123/77 | HR 77 | Temp 97.5°F | Ht 63.0 in | Wt 157.0 lb

## 2023-06-05 DIAGNOSIS — L84 Corns and callosities: Secondary | ICD-10-CM

## 2023-06-05 DIAGNOSIS — M2042 Other hammer toe(s) (acquired), left foot: Secondary | ICD-10-CM

## 2023-06-05 MED ORDER — OXYCODONE HCL 5 MG PO TABS
5.0000 mg | ORAL_TABLET | ORAL | 0 refills | Status: AC | PRN
Start: 2023-06-05 — End: ?

## 2023-06-05 MED ORDER — IBUPROFEN 800 MG PO TABS: 800.0000 mg | ORAL_TABLET | Freq: Four times a day (QID) | ORAL | 1 refills | Status: DC | PRN

## 2023-06-05 NOTE — Progress Notes (Signed)
 Podiatric Surgery Patient Visit    Patient Name: Wendy [name],Wendy [name]  Assessment & Plan:   -Patient is 68 y.o. female   Assessment & Plan  1. Hammertoe, left foot -interdigital corn and callus to left first and second toe  Patient has significant pain and difficulty with shoe wear and has failed all efforts at conservative therapy over the last 6 months.    Preoperative Evaluation for Left Foot Surgery  Patient presents for preoperative evaluation for left great toe and second toe surgery to address bony prominences and arthritis. Procedure involves two incisions to remove part of the knuckle of the second toe and file down the bone on the lateral aspect of the great toe. Discussed small scar, stitches for two weeks, and need to keep dressing clean and dry. Surgery is short with low infection risk, and patient can walk immediately post-surgery. Patient agrees with surgical plan, including use of surgical shoe and pain management.  - Perform left foot surgery on great toe and second toe-date time and location confirmed.  - Administer IV antibiotics on the day of surgery  - Provide surgical shoe today.  Patient will bring on the day of surgery.  - Prescribe 800 mg ibuprofen .  Patient has Tylenol  at home that she can use to supplement.  - Prescribe oxycodone  for breakthrough pain  - Instruct to keep dressing clean and dry  - Schedule follow-up in two weeks for stitch removal  - Order x-ray of left foot today.  X-rays reviewed for surgical planning.    Pain Management Post-Surgery  Patient will require pain management post-surgery. Discussed use of 800 mg ibuprofen  and acetaminophen , with oxycodone  for breakthrough pain. Patient has allergies to penicillin, iodine, shellfish, atorvastatin , morphine (causes itching), tetanus, and Zocor . She tolerates oxycodone  well.  - Prescribe 800 mg ibuprofen   - Instruct to use acetaminophen  as needed.  She has a's at home and did not wish to have her prescription sent in today.  - Prescribe  oxycodone  for breakthrough pain    COVID-19 Infection  Patient tested positive for COVID-19 on 05/31/2023. Reports feeling much better and denies active symptoms. This is her fourth COVID-19 infection. Surgery can proceed as planned if no new symptoms develop.  -[facility]  surgery center requires 10 days postinfection for surgical clearance.  Patient will be 11 days postinfection on the day of her surgery so we can proceed as planned.  - Monitor for any worsening symptoms, particularly respiratory issues  - Proceed with surgery as planned if no new symptoms develop    Follow-up  - Schedule follow-up in two weeks for stitch removal  - Ensure patient has all necessary information for surgery location and time  - Confirm someone will be available to take her home post-surgery.             Subjective:     Chief Complaint:   Chief Complaint   Patient presents with    Pre-op Exam     SX 06/11/2023       HPI:  Wendy [name] is a 68 y.o. female.   History of Present Illness    The patient, with a history of multiple COVID-19 infections, presents for a pre-operative visit for upcoming left foot surgery on the great toe and second toe. She recently tested positive for COVID-19 on 05/31/2023, but reports feeling much better and denies any active symptoms.  She has been told by her PCP that she can proceed with surgery.  She has been compliant  with all recommended precautions and is surprised by the repeated infections.    The patient has been experiencing significant pain in her left foot, specifically in the great toe and second toe. The pain has been persistent and severe, affecting her quality of life. She denies any changes in the condition of her toes since her last visit.  She is here to discuss surgery to remove bony prominences causing chronic pain in her toes.    The patient also reports a history of allergies to penicillin, iodine, atorvastatin , morphine, tetanus, and Zocor . She has previously tolerated oxycodone   well.           Denies N/V/F/C/SOB/CP  All other systems were reviewed and are negative  The following portions of the patient's history were reviewed and updated as appropriate: allergies, current medications, past medical history, past surgical history, family history, and problem list.    Physical Exam:     Vitals:    06/05/23 1050   BP: 123/77   Pulse: 77   Temp: 97.5 F (36.4 C)         Gen: AAOx3, NAD, Well developed  HEENT: Normocephalic, EOMI  Heart: Regular pulse rate and rhythm  Lower extremity focused exam:   Physical Exam  There is a large interdigital corn and callus on the medial side of the left second toe and lateral side of the left great toe. These are exquisitely painful. They appear preulcerative, but no active ulceration or open lesion. There is no significant varus or valgus deformity of the toes. Pulses are strongly palpable. Capillary refill time is immediate to the toes. Neurovascular status is intact. Multiple nails are thickened and dystrophic. There are other areas of callus to the tips of the toes.      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 Prominence at the base of the left second toe.        Radiology:     Xrays taken in clinic today: X-ray imaging of the left foot was obtained multiple weightbearing views: Imaging shows evidence of prior surgery to the fourth toe middle phalanx.  Bony abutment between the lateral aspect of the great toe distal phalanx and the PIP joint of the second toe where patient's painful callus is localized.  No acute fracture or dislocation.    Results  LABS  COVID-19: positive (05/31/2023)           Philippe Abe, DPM FACFAS   [facility]  Pager [phone]    ------------------------------------------------------------------------------------------------------------------------------  The review of the patient's medications does not in any way constitute an endorsement,  by this clinician,  of their use, dosage, indications, route, efficacy, interactions, or other clinical parameters.    This note was generated within the EPIC EMR using Dragon medical speech recognition software and may contain inherent errors or omissions not intended by the user. Grammatical and punctuation errors, random word insertions, deletions, pronoun errors and incomplete sentences are occasional consequences of this technology due to software limitations. Not all errors are caught or corrected.  Although every attempt is made to root out erroneus and incomplete transcription, the note may still not fully represent the intent or opinion of the author. If there are questions or concerns about the content of this note or information contained within the body of this dictation they should be addressed directly with the author for clarification.

## 2023-06-07 ENCOUNTER — Encounter (INDEPENDENT_AMBULATORY_CARE_PROVIDER_SITE_OTHER): Payer: Medicare PPO

## 2023-06-11 ENCOUNTER — Encounter (INDEPENDENT_AMBULATORY_CARE_PROVIDER_SITE_OTHER): Payer: Self-pay | Admitting: Foot & Ankle Surgery

## 2023-06-11 ENCOUNTER — Ambulatory Visit (INDEPENDENT_AMBULATORY_CARE_PROVIDER_SITE_OTHER): Payer: Medicare PPO | Admitting: Foot & Ankle Surgery

## 2023-06-11 ENCOUNTER — Encounter (INDEPENDENT_AMBULATORY_CARE_PROVIDER_SITE_OTHER): Payer: Self-pay

## 2023-06-11 DIAGNOSIS — M2042 Other hammer toe(s) (acquired), left foot: Secondary | ICD-10-CM

## 2023-06-11 DIAGNOSIS — M7752 Other enthesopathy of left foot: Secondary | ICD-10-CM

## 2023-06-11 DIAGNOSIS — L84 Corns and callosities: Secondary | ICD-10-CM

## 2023-06-11 NOTE — Progress Notes (Signed)
 LEFT 1st and second toe surgery done at [facility]  See op report    Claudette Head, [facility]  [facility]  Pager [phone]

## 2023-06-11 NOTE — Op Note (Signed)
 Procedure Date: 06/11/2023    SURGEON: Philippe C. Doroteo, DPM    ASSISTANT:  Marry Gallop, DPM    PREOPERATIVE DIAGNOSES:  1.  Left second hammertoe.  2.  Left great toe phalanx bone spur.  3.  Interdigital corn and callus.    POSTOPERATIVE DIAGNOSES:  1.  Left second hammertoe.  2.  Left great toe phalanx bone spur.  3.  Interdigital corn and callus.    TITLE OF PROCEDURE:  1.  Left second hammertoe repair with proximal interphalangeal joint   arthroplasty.  2.  Partial excision base of phalanx and bone spur, left great toe phalanx.    ESTIMATED BLOOD LOSS:  Minimal.    ANTIBIOTICS:  Ancef  2 grams preoperatively.    IMPLANTS:  None.    ANESTHESIA:  Local anesthetic block with a total of 10 mL of 0.5% Marcaine .    INDICATIONS FOR PROCEDURE:  This is a patient with longstanding pain between her first and second toe   of the left great toe.  She has an arthritic hammered left second toe with   enlarged medial flare and rigid joint with spurring of the second toe PIP   joint.  There is also an enlarged flare of the lateral aspect of the base   of her left great toe, which is causing pain and a kissing corn type lesion  interdigitally.  She has exhausted all efforts at conservative therapy   including years of offloading, padding, using toe spacers and trimming   corns and calluses and her pain continues to worsen and is threatening   breakdown of her skin.  She has elected to have surgery today to resolve   this problem.    DESCRIPTION OF PROCEDURE:  The patient was brought to the operating room and placed on the operating   table in supine position.  A timeout was performed where the correct   patient, site, and procedure were identified.  The left ankle had a   well-padded tourniquet placed.  The left foot was cleaned thoroughly with   alcohol and a local digital block was performed to the left great toe and   left second toe.  Left lower extremity was then prepped and draped in the   usual sterile  manner.    Attention was first directed to the dorsal aspect of the left 2nd toe PIP   joint.  It is a rigid joint with crepitus and enlargement.  A Dorsal linear  incision was made over the joint and deepened down to the level of the   extensor tendon carefully retracting the skin and minor blood vessels.  A   transverse tenotomy was performed and the extensor tendon was reflected and  the joint was opened.  The distal head of the proximal phalanx was   arthritic and extensively spurred and abnormal.  A sagittal saw was used to  resect this removing the abnormal bone.  The incision was then flushed   extensively.    A separate incision was then made over the dorsal medial aspect of the left  great toe just distal to the IP joint where there was an enlarged flare of   bone spur to the phalanx of the great toe.  This was deepened down to the   level of bone, carefully retracting any neurovascular structures.  The   large bone spur was then exposed and resected with a sagittal saw partially  excising the phalanx of the toe.  The area was  then smoothed down with a   power rasp before and after x-rays were taken with C-arm fluoroscopy and   the bony prominences and problematic areas were successfully removed.    Incisions were flushed.  Extensor tendon was repaired with 3-0 Vicryl and   skin was closed with a 3-0 Prolene.  Tourniquet was released prior to   incision closure and all bleeding was very well controlled.  A   postoperative block was given and sterile dressings were applied.    The patient tolerated the procedure well and was transferred to the PACU in  stable condition with no complication.      D: 06/11/2023 01:47 PM by Philippe BROCKS. [place], DPM [phone])  T: 06/11/2023 02:12 PM by [id number] (Conf: [id number]) (Doc ID: [id number])

## 2023-06-14 ENCOUNTER — Encounter (INDEPENDENT_AMBULATORY_CARE_PROVIDER_SITE_OTHER): Payer: Self-pay

## 2023-06-26 ENCOUNTER — Ambulatory Visit (INDEPENDENT_AMBULATORY_CARE_PROVIDER_SITE_OTHER): Payer: Medicare PPO | Admitting: Foot & Ankle Surgery

## 2023-06-26 ENCOUNTER — Encounter (INDEPENDENT_AMBULATORY_CARE_PROVIDER_SITE_OTHER): Payer: Self-pay | Admitting: Foot & Ankle Surgery

## 2023-06-26 VITALS — BP 146/79 | HR 71 | Ht 64.0 in | Wt 157.0 lb

## 2023-06-26 DIAGNOSIS — L84 Corns and callosities: Secondary | ICD-10-CM

## 2023-06-26 DIAGNOSIS — M2042 Other hammer toe(s) (acquired), left foot: Secondary | ICD-10-CM

## 2023-06-26 DIAGNOSIS — M7752 Other enthesopathy of left foot: Secondary | ICD-10-CM

## 2023-06-26 NOTE — Progress Notes (Signed)
 Podiatric Surgery Patient Visit    Patient Name: Wendy [name],Wendy [name]  Assessment & Plan:   -Patient is 68 y.o. female.   Assessment & Plan  Postoperative Follow-Up for Left Toe Surgery  Follow-up after left toe surgery on June 10, 2022. Reports no pain and minimal swelling. Physical exam shows healed incision sites with no dehiscence or infection. Mild trace edema present, expected post-surgery. Sutures removed without complications. Discussed normal post-surgical swelling, advised transitioning to regular shoes at her own pace, and massaging scars in a couple of weeks to reduce thickness and sensitivity. Advised on showering and drying the surgical site, and using a dry Band-Aid if irritation occurs.  - Allow showering but avoid soaking the surgical site.  - Ensure thorough drying, especially between the toes, after showering.  - Apply moisturizer and perform gentle massage on scars in a couple of weeks.  - Transition to regular shoes at her own pace, with the option to use a Velcro shoe or boot if more comfortable.  - Use a dry Band-Aid if irritation or rubbing against the sock occurs.  - Schedule follow-up appointment in two months to assess healing progress.    General Health Maintenance  Generally in good health, feeling better with the return of daylight.    Follow-up  - Schedule follow-up appointment in two months.         Subjective:     Chief Complaint:   Chief Complaint   Patient presents with    Post-op     2 weeks out s/p Hammer toe of left foot  DOS 06/11/2023       HPI:  Wendy [name] is a 68 y.o. female.   History of Present Illness  The patient, with a history of left toe surgery on 06/10/2022, presents for a follow-up visit. She reports no pain post-surgery and is eager to have the stitches removed. She mentions an incident where she accidentally kicked a door with her left foot, which is a habitual action for her. She has been driving and is looking forward to resuming normal activities. The patient is  also excited about the prospect of being able to shower and get the surgical area wet. She inquires about the need to wear the post-surgical boot and is informed that she can transition to regular footwear at her own pace.         Physical Exam:     Vitals:    06/26/23 0938   BP: 146/79   Pulse: 71       Gen: AAOx3, NAD  LOWER EXTREMITY EXAM:  Physical Exam  EXTREMITIES: Incision sites completely healed, no dehiscence or infection. Mild trace edema to the toes. Capillary refill time immediate.         Radiology:     Xrays taken in clinic today: NA      Results  Procedure: Suture Removal  Description: The sutures were removed from the left toe. The incision sites were completely healed with no evidence of dehiscence or infection. Mild trace edema to the toes was observed.           Philippe Abe, DPM FACFAS  [facility]iatric [facility]  Pager [phone]    ------------------------------------------------------------------------------------------------------------------------------  The review of the patient's medications does not in any way constitute an endorsement, by this clinician,  of their use, dosage, indications, route, efficacy, interactions, or other clinical parameters.    This note was generated within the EPIC EMR using Dragon medical speech recognition software  and may contain inherent errors or omissions not intended by the user. Grammatical and punctuation errors, random word insertions, deletions, pronoun errors and incomplete sentences are occasional consequences of this technology due to software limitations. Not all errors are caught or corrected.  Although every attempt is made to root out erroneous and incomplete transcription, the note may still not fully represent the intent or opinion of the author. If there are questions or concerns about the content of this note or information contained within the body of this dictation they should be addressed directly with the author for  clarification.

## 2023-07-02 NOTE — Progress Notes (Signed)
 This note was generated with the assistance of [employer] copilot with the patient or legal representative's consent.    CARE TEAM:  Patient Care Team:  Pcp, No as PCP - General (General Practice)    ASSESSMENT  1. Left wrist pain    2. 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 Arthritis of carpometacarpal (CMC) joint of left thumb       There is no problem list on file for this patient.    Impression:     Assessment & Plan  1. Left wrist volar ganglion cyst.  The patient has a left wrist volar ganglion cyst. She is relatively asymptomatic from this issue. The ultrasound report confirms the presence of a ganglion cyst. The likelihood of malignancy is minimal. Treatment options were discussed, including monitoring the cyst over time, surgical removal, or aspiration of fluid for pathologic evaluation under ultrasound guidance. Given the lack of symptoms, no immediate treatment is recommended. If the cyst increases in size or causes discomfort, she will contact the office for further treatment.    2. Left thumb CMC joint arthritis.  The patient has significant arthritis in the left thumb CMC joint, as evidenced by x-rays and physical examination findings of crepitus and intermittent pain. Treatment options were discussed. No specific treatment is recommended at this time due to the relative lack of symptoms.    Follow-up  The patient will follow up as needed.    PLAN  Treatment Plan:   Orders Placed This Encounter   . X-ray wrist left 3+ views ([id number])   . BP Patient Education    Follow-up: No follow-ups on file.    HISTORY OF PRESENT ILLNESS  Chief Complaint: Pain of the Left Wrist   Age: 68 y.o.  Sex: female   Hand-dominance: right  History of present illness:   History of Present Illness  The patient presents for evaluation of a left wrist cyst.    She is right-handed and reports the sudden appearance of a mass on her left wrist in 02/2023. She has been experiencing intermittent pain in her hand, which she  attributes to potential age-related arthritis. An ultrasound examination confirmed the presence of a cyst. She expresses concern about the possibility of malignancy, given her history of benign cysts in other locations and a family history of a malignant cyst in her brother's shoulder. She has a history of benign cysts in other locations, including the brain and breast.    FAMILY HISTORY  Her brother had a cyst on his shoulder that was cancerous.  OBJECTIVE  Constitutional:  No acute distress.   BP (!) 150/90   Pulse 69   Ht 5\' 4"    Wt 157 lb (71.2 kg)   BMI 26.95 kg/m    Pain rating = 6  out of 10    Respiratory:  No labored breathing.  Cardiovascular:  No marked edema.    Physical Exam  On physical examination of the left hand and wrist, there is a small mass located over the volar and radial aspect of the wrist. This is well circumscribed and has the appearance of a ganglion cyst. There is no pain with palpation. Thumb CMC grind maneuver is negative; however, there is crepitus. The thumb MP joint is stable. Range of motion is good, intact. Light touch sensation, warm and well perfused.      IMAGING / STUDIES   Order: XR WRIST 3+ VW LEFT - Indication: Left wrist pain  X-ray wrist left 3+ views ([id number])  Result Date: 07/02/2023  Shielding: Shielded. PA, Lat, Oblique, Tilted Lat.     Impression: 4 views of the left wrist are reviewed.  No obvious acute fractures or dislocations.  Moderate thumb CMC joint arthritis is noted.      Results  Imaging  X-rays of the left wrist demonstrate moderate thumb CMC joint arthritis. Ultrasound report of the left wrist is consistent with a ganglion cyst.      PROCEDURES  Procedures          Tressie Ellis, MD  Orthopaedic Surgery  Hand Surgery

## 2023-07-03 ENCOUNTER — Ambulatory Visit (INDEPENDENT_AMBULATORY_CARE_PROVIDER_SITE_OTHER): Admitting: Family Nurse Practitioner

## 2023-07-03 ENCOUNTER — Encounter (INDEPENDENT_AMBULATORY_CARE_PROVIDER_SITE_OTHER): Payer: Self-pay | Admitting: Family Nurse Practitioner

## 2023-07-03 VITALS — BP 164/82 | HR 76 | Ht 64.0 in | Wt 159.0 lb

## 2023-07-03 DIAGNOSIS — I10 Essential (primary) hypertension: Secondary | ICD-10-CM

## 2023-07-03 DIAGNOSIS — E782 Mixed hyperlipidemia: Secondary | ICD-10-CM

## 2023-07-03 MED ORDER — LOSARTAN POTASSIUM 50 MG PO TABS
50.0000 mg | ORAL_TABLET | Freq: Every day | ORAL | 0 refills | Status: DC
Start: 2023-07-03 — End: 2023-08-08

## 2023-07-03 MED ORDER — NEXLIZET 180-10 MG PO TABS
1.0000 | ORAL_TABLET | Freq: Every day | ORAL | 3 refills | Status: DC
Start: 2023-07-03 — End: 2023-12-06

## 2023-07-03 NOTE — Progress Notes (Signed)
 [place]  HEART CARDIOLOGY OFFICE PROGRESS NOTE    H[facility] [place] [facility] [place]    HEART [facility] OFFICE - CARDIOLOGY  [address]  [place] [address]  Dept: [phone]  Dept Fax: [phone]       Patient Name: MORRIE SARI SAUNDERS    Date of Visit:  July 03, 2023  Date of Birth: [birth year]  AGE: 68 y.o.  Medical Record #: [id number]  Requesting Physician: Kaleta Hoes, MD    CHIEF COMPLAINT: Hypertension     HISTORY OF PRESENT ILLNESS:    She is a pleasant 68 y.o. female who presents today for follow up.     Since her last follow up with Dr. Lex in Aug 2024, she has been doing well and denies any chest pain/pressure/discomfort, SOB, DOE, orthopnea, or palpitations. She has been working out regularly at [employer], doing cardio workouts daily without exertional chest pain or shortness of breath.  Thankfully reassuring CT ca sore of 0 recently.     She has been compliant with all of her cardiac medication. Prior authorization for the Nexletol approved through June 2025. Asymptomatic today. BP elevated in the office with reports of SBP mostly in the 140s.     PAST MEDICAL HISTORY: She has a past medical history of Brain tumor (benign) (CMS/HCC), Hypercholesteremia, and Hypertensive disorder. She has a past surgical history that includes Brain surgery (2009); Corneal transplant (2013); Breast biopsy (Right, 2008); Breast biopsy (Right, 1970s); Breast cyst excision (1970s); Eye surgery; Cesarean section; Tubal ligation; EXCHANGE, CORNEAL IMPLANT (Left); ARTHROPLASTY, KNEE, TOTAL MAKOPLASTY (Left, 08/03/2020); and Knee surgery (Left, 08/03/2020).    ALLERGIES:   Allergies  Review status set to Review Complete by Darroll Distel, MA on 06/26/2023        Severity Reactions Comments    Iodine High Shortness Of Breath     Penicillins High Shortness Of Breath Has patient had a PCN reaction causing immediate rash, facial/tongue/throat swelling, SOB or lightheadedness with  hypotension: Yes Has patient had a PCN reaction causing severe rash involving mucus membranes or skin necrosis: No Has patient had a PCN reaction that required hospitalization: No Has patient had a PCN reaction occurring within the last 10 years: Yes If all of the above answers are NO, then may proceed with Cephalosporin use.    Shellfish-derived Products High Anaphylaxis     Atorvastatin  Not Specified  Joint pain     Morphine Not Specified  Itchy     Tetanus Immune Globulin Not Specified  Arm swelling     Zocor  [simvastatin ] Not Specified  No response             MEDICATIONS:   Patient's current medications were reviewed. ONLY Cardiac medications were updated unless others were addressed in assessment and plan.  Current Outpatient Medications   Medication Instructions    acetaZOLAMIDE  (DIAMOX ) 125 mg, Oral, RT - Once as needed    Bempedoic Acid-Ezetimibe  (Nexlizet ) 180-10 MG Tab 1 tablet, Oral, Daily    benzonatate  (TESSALON ) 100 mg, Oral, 3 times daily PRN    co-enzyme Q-10 50 mg, Daily    fluticasone  (FLONASE ) 50 MCG/ACT nasal spray 1 spray, Nasal, Daily    ibuprofen  (ADVIL ) 800 mg, Oral, Every 6 hours PRN    losartan  (COZAAR ) 50 mg, Oral, Daily, In the morning.    Multiple Vitamin (MULTIVITAMIN) tablet 1 tablet, Daily    nortriptyline  (PAMELOR ) 10 mg, Oral, Daily PRN    nystatin  (MYCOSTATIN ) ointment Topical, 2 times daily  oxyCODONE  (ROXICODONE ) 5-10 mg, Oral, Every 4 hours PRN    prednisoLONE  acetate (PRED FORTE ) 1 % ophthalmic suspension 1 drop, Left Eye, 4 times daily    triamcinolone  (KENALOG ) 0.025 % ointment Topical, 2 times daily        FAMILY HISTORY: family history includes Diabetes in her father and mother; Hyperlipidemia in her mother; Hypertension in her father and mother.    SOCIAL HISTORY: She reports that she has never smoked. She has never used smokeless tobacco. She reports current alcohol use. She reports that she does not use drugs.    PHYSICAL EXAMINATION    Visit Vitals  BP 164/82    Pulse 76   Ht 1.626 m (5' 4)   Wt 72.1 kg (159 lb)   BMI 27.29 kg/m     Constitutional: Cooperative, alert, no acute distress.  Neck: No carotid bruits, JVP normal.  Cardiac: Regular rate and rhythm, normal S1 and S2. No MGR detected.  Pulmonary: CTAB. No WRR.  Extremities: No edema.  Vascular: +2 pulses in radial artery bilaterally, 2+ pedal pulses bilaterally.    ECG: NA     LABS:   Lab Results   Component Value Date    WBC 4.72 05/30/2022    HGB 13.0 05/30/2022    HCT 39.9 05/30/2022    PLT 354 (H) 05/30/2022     Lab Results   Component Value Date    GLU 93 03/20/2023    BUN 14 03/20/2023    CREAT 0.7 03/20/2023    NA 139 03/20/2023    K 3.9 03/20/2023    CL 105 03/20/2023    CO2 28 03/20/2023    AST 41 03/20/2023    ALT 30 03/20/2023     Lab Results   Component Value Date    TSH 0.80 07/06/2021    HGBA1C 5.7 (H) 03/20/2023     Lab Results   Component Value Date    CHOL 213 (H) 03/20/2023    TRIG 56 03/20/2023    HDL 57 03/20/2023    LDL 145 (H) 03/20/2023       The 10-year ASCVD risk score (Arnett DK, et al., 2019) is: 18%    Values used to calculate the score:      Age: 50 years      Sex: Female      Is Non-Hispanic African American: Yes      Diabetic: No      Tobacco smoker: No      Systolic Blood Pressure: 164 mmHg      Is BP treated: Yes      HDL Cholesterol: 57 mg/dL      Total Cholesterol: 213 mg/dL     Most recent labs, BP log reviewed.    IMPRESSION:   Ms. Szatkowski is a 68 y.o. female with the following problems:    Hypertension, suboptimal  Hyperlipidemia  Intolerant to simvastatin  and rosuvastatin  -neck itching when taken concomitantly with antihypertensive medication  No aortic aneurysm (sees abdominal/pelvis CT scan May 30, 2022)  Abnormal nuc stress test likely from breast attenuation defect with subsequent CT score of 0, Oct 2024. No anginal equivalent symptoms with high levels of exercises.    RECOMMENDATIONS:    Increase losartan  to 50 mg daily.  Repeat BMP in 2 weeks.  Check blood pressure  and heart rate daily.  Call if systolic blood pressure sustains less than 100s or greater than 140s, or heart rate sustains less than 50s or greater than 110s.  Continue all other cardiac medications.   Continue lifestyle modification.  Follow-up with APP in 1 month to reassess vitals, or sooner as needed.                                               Orders Placed This Encounter   Procedures    Basic Metabolic Panel    APP Office Visit ([facility] [place])     Orders Placed This Encounter   Medications    losartan  (COZAAR ) 50 MG tablet     Sig: Take 1 tablet (50 mg) by mouth daily In the morning.     Dispense:  90 tablet     Refill:  0    Bempedoic Acid-Ezetimibe  (Nexlizet ) 180-10 MG Tab     Sig: Take 1 tablet by mouth daily     Dispense:  90 tablet     Refill:  3     SIGNED:    Bartley Cheril Mai, FNP      Please pardon any potential grammatical errors or typos as aspects of this note may have been created through speech-to-text software. Thank you.

## 2023-07-13 ENCOUNTER — Ambulatory Visit (INDEPENDENT_AMBULATORY_CARE_PROVIDER_SITE_OTHER): Admitting: Internal Medicine

## 2023-07-13 ENCOUNTER — Encounter (INDEPENDENT_AMBULATORY_CARE_PROVIDER_SITE_OTHER): Payer: Self-pay | Admitting: Internal Medicine

## 2023-07-13 VITALS — BP 129/83 | HR 69 | Temp 98.0°F | Resp 17 | Ht 64.0 in | Wt 159.6 lb

## 2023-07-13 DIAGNOSIS — Z130 Encounter for screening for diseases of the blood and blood-forming organs and certain disorders involving the immune mechanism: Secondary | ICD-10-CM

## 2023-07-13 DIAGNOSIS — Z79899 Other long term (current) drug therapy: Secondary | ICD-10-CM

## 2023-07-13 DIAGNOSIS — Z1231 Encounter for screening mammogram for malignant neoplasm of breast: Secondary | ICD-10-CM

## 2023-07-13 DIAGNOSIS — E782 Mixed hyperlipidemia: Secondary | ICD-10-CM

## 2023-07-13 DIAGNOSIS — M8580 Other specified disorders of bone density and structure, unspecified site: Secondary | ICD-10-CM

## 2023-07-13 DIAGNOSIS — R7303 Prediabetes: Secondary | ICD-10-CM

## 2023-07-13 DIAGNOSIS — Z780 Asymptomatic menopausal state: Secondary | ICD-10-CM

## 2023-07-13 DIAGNOSIS — I10 Essential (primary) hypertension: Secondary | ICD-10-CM

## 2023-07-13 DIAGNOSIS — Z1329 Encounter for screening for other suspected endocrine disorder: Secondary | ICD-10-CM

## 2023-07-13 DIAGNOSIS — Z0000 Encounter for general adult medical examination without abnormal findings: Secondary | ICD-10-CM

## 2023-07-13 DIAGNOSIS — Z1389 Encounter for screening for other disorder: Secondary | ICD-10-CM

## 2023-07-13 LAB — LAB USE ONLY - CBC WITH DIFFERENTIAL
Absolute Basophils: 0.06 10*3/uL (ref 0.00–0.08)
Absolute Eosinophils: 0.34 10*3/uL (ref 0.00–0.44)
Absolute Immature Granulocytes: 0 10*3/uL (ref 0.00–0.07)
Absolute Lymphocytes: 1.82 10*3/uL (ref 0.42–3.22)
Absolute Monocytes: 0.67 10*3/uL (ref 0.21–0.85)
Absolute Neutrophils: 1.34 10*3/uL (ref 1.10–6.33)
Absolute nRBC: 0 10*3/uL (ref <=0.00)
Basophils %: 1.4 %
Eosinophils %: 8 %
Hematocrit: 38.8 % (ref 34.7–43.7)
Hemoglobin: 12.3 g/dL (ref 11.4–14.8)
Immature Granulocytes %: 0 %
Lymphocytes %: 43 %
MCH: 28.5 pg (ref 25.1–33.5)
MCHC: 31.7 g/dL (ref 31.5–35.8)
MCV: 89.8 fL (ref 78.0–96.0)
MPV: 10.1 fL (ref 8.9–12.5)
Monocytes %: 15.8 %
Neutrophils %: 31.8 %
Platelet Count: 420 10*3/uL — ABNORMAL HIGH (ref 142–346)
Preliminary Absolute Neutrophil Count: 1.34 10*3/uL (ref 1.10–6.33)
RBC: 4.32 10*6/uL (ref 3.90–5.10)
RDW: 14 % (ref 11–15)
WBC: 4.23 10*3/uL (ref 3.10–9.50)
nRBC %: 0 /100{WBCs} (ref <=0.0)

## 2023-07-13 LAB — URINALYSIS WITH MICROSCOPIC EXAM
Urine Bilirubin: NEGATIVE
Urine Blood: NEGATIVE
Urine Glucose: NEGATIVE
Urine Ketones: NEGATIVE mg/dL
Urine Nitrite: NEGATIVE
Urine Specific Gravity: 1.015 (ref 1.001–1.035)
Urine Urobilinogen: NORMAL mg/dL (ref 0.2–2.0)
Urine pH: 7 (ref 5.0–8.0)

## 2023-07-13 LAB — COMPREHENSIVE METABOLIC PANEL
ALT: 21 U/L (ref <=55)
AST (SGOT): 24 U/L (ref <=41)
Albumin/Globulin Ratio: 1.4 (ref 0.9–2.2)
Albumin: 4.2 g/dL (ref 3.5–5.0)
Alkaline Phosphatase: 67 U/L (ref 37–117)
Anion Gap: 8 (ref 5.0–15.0)
BUN: 20 mg/dL (ref 7–21)
Bilirubin, Total: 0.3 mg/dL (ref 0.2–1.2)
CO2: 27 meq/L (ref 17–29)
Calcium: 8.9 mg/dL (ref 8.5–10.5)
Chloride: 105 meq/L (ref 99–111)
Creatinine: 0.7 mg/dL (ref 0.4–1.0)
GFR: >60 mL/min/{1.73_m2} (ref >=60.0)
Globulin: 3 g/dL (ref 2.0–3.6)
Glucose: 88 mg/dL (ref 70–100)
Hemolysis Index: 1 {index}
Potassium: 4.2 meq/L (ref 3.5–5.3)
Protein, Total: 7.2 g/dL (ref 6.0–8.3)
Sodium: 140 meq/L (ref 135–145)

## 2023-07-13 LAB — LIPID PANEL
Cholesterol / HDL Ratio: 3.9 {index}
Cholesterol: 209 mg/dL — ABNORMAL HIGH (ref <=199)
HDL: 53 mg/dL (ref >=40)
LDL Calculated: 141 mg/dL — ABNORMAL HIGH (ref 0–99)
Triglycerides: 75 mg/dL (ref 34–149)
VLDL Calculated: 15 mg/dL (ref 10–40)

## 2023-07-13 LAB — TSH: TSH: 0.75 u[IU]/mL (ref 0.35–4.94)

## 2023-07-13 LAB — HEMOGLOBIN A1C
Average Estimated Glucose: 125.5 mg/dL
Hemoglobin A1C: 6 % — ABNORMAL HIGH (ref 4.6–5.6)

## 2023-07-13 NOTE — Progress Notes (Addendum)
 Chief Complaint   Patient presents with    Annual Exam       Wendy [name] is a 68  y.o. female who presents today medicare MCO advantage         Hx HLD, she sees cardiology she is on Nexlizet a  Compliant with cholesterol medication  No Myalgias,  Muscle aches , bodyaches  Compliant with low saturated and low carbohydrate Diet  No hx abnormal liver test related to statin therapy       History of Chronic Hypertension  .The patient is compliant with anti-hypertensive therapy and denies side effects to therapy.  Pt denies CP, SOB, dizziness, orthopnea, PND or edema.   Is tolerating meds without side effects.   Patient  is compliant with Medications  BP at goal  She want sto stop lisinopril HCTZ an dtry losartan low dose     Hx left corneal transplant long time on g prednisone drops from me  Sees eye specialist    History of recent toe injury, she was seen by podiatry and underwent surgery in February 2020 for  1.  Left second hammertoe repair with proximal interphalangeal joint   arthroplasty.  2.  Partial excision base of phalanx and bone spur, left great toe phalanx        Depression Screening    See related Activity or Flowsheet    Functional Ability    Falls Risk:  home does not have throw rugs, poor lighting or a slippery bath tub or shower  Hearing:  hearing within normal limits  Exercise:  walking  ADL's:   Bathing - independent   Dressing - independent   Mobility - independent   Transfer - independent   Eating - independent}   Toileting - independent   ADL assistance not needed        History of Chronic Hypertension  .The patient is compliant with anti-hypertensive therapy and denies side effects to therapy.  Pt denies CP, SOB, dizziness, orthopnea, PND or edema.  shechecks Blood pressure at home and is 140s to 150s over 80s, it was elevated at cardiology office and her losartan was increased to 50 mg   Is tolerating meds without side effects.   Patient  is compliant with Medications  Her blood pressure is at  goal, I advised to bring her home machine for calibration, she is following up with cardiology in April advised her to take her home machine for calibration she verbalized understand      Evaluation of Cognitive Function    Mood/affect: Appropriate  Appearance: neatly groomed, appropriately and adequately nourished  Family member/caregiver input: Not present            Depression Screen  Q1: Over the past two weeks, have you felt down, depressed or hopeless? no  Q2: Over the past two weeks, have you felt little interest or pleasure in doing things? no          Cognitive evaluation:;  Level of consciousness; alert and awake through out the visit    Attention and concentration: able to stay focused, participated in the conversation, no tangential thoughts, good concentration, no distraction noted, able to give me series of even and odd numbers, oriented to time, place, person, year, month and date    Language: Fluent in english, no slurred speech,  , obeys simple and complex commands  Performs simple and complex task  Review of Systems:   Review of Systems - History obtained from the patient  General ROS: negative for - chills, fatigue, fever, malaise, or weight loss  Psychological ROS: negative for - anxiety, c, depression,   Ophthalmic ROS: Hx left corneal transplant   uses glasses  ENT ROS: negative for - hearing change,   Allergy and Immunology ROS: negative for - seasonal allergies  Breast ROS: negative for breast lumps  Respiratory ROS: no cough, shortness of breath, or wheezing  negative for - hemoptysis  Cardiovascular ROS: no chest pain or dyspnea on exertion  negative for - palpitations or paroxysmal nocturnal dyspnea  Gastrointestinal ROS: no abdominal pain, change in bowel habits, or black or bloody stools  Genito-Urinary ROS: no dysuria, trouble voiding, or hematuria: no menorrhagia or  abnormal cycle,   negative for - hematuria, urinary frequency/urgency or vulvar/vaginal  symptoms  Musculoskeletal ROS: s/p left knee OA TKR  Neurological ROS: negative for - dizziness, gait disturbance, headaches, numbness/tingling or visual changes  Derm; hypopigmented  age spots   Vitals:    07/13/23 1116   BP: 129/83   Pulse:    Resp:    Temp:    SpO2:          Physical Examination: General appearance - alert, well appearing, and in no distress and oriented to person, place, and time  Mental status - alert, oriented to person, place, and time, normal mood, behavior, speech, dress, motor activity, and thought processes  Eyes - pupils equal and reactive, extraocular eye movements intact  Ears - bilateral TM's and external ear canals normal  Neck - supple, no significant adenopathy, carotids upstroke normal bilaterally, no bruits and thyroid exam: thyroid is normal in size without nodules or tenderness  Chest - clear to auscultation, no wheezes, rales or rhonchi, symmetric air entry  Heart - normal rate, regular rhythm, normal S1, S2, no murmurs, rubs, clicks or gallops  Abdomen - soft, nontender, nondistended, no masses or organomegaly  No hepatosplenomegaly or organomegaly  Neurological - alert, oriented, normal speech, no focal findings or movement disorder noted, , motor and sensory grossly normal bilaterally  Musculoskeletal - no joint tenderness, deformity or swelling, no muscular tenderness noted  Extremities - peripheral pulses normal, no pedal edema, no clubbing or cyanosis  Breasts: breasts appear normal, no suspicious masses, no skin or nipple changes or axillary nodes.  Physical Examination: Pelvic NA      Assessment and Plan:   Wendy [name] is a 69 y.o. female here for medicare AWV   Medicare Annual Wellness Examination:  Health maintenance  - I counseled the pt on the benefits of taking calcium 1200-1500 mg po q daily, vit d 400-800iu po q daily, regular sun exposure and weight bearing exercise for bone health  - I also counseled the pt on the benefits of eating a healthy diet, exercising  regularly, maintaining a healthy weight and getting enough sleep   - I also counseled the pt about the importance of wearing seat belts and no texting while driving and using sun protection  - Diet:  I rec eating plenty of fruits, vegetables and whole grains and  limiting intake of salt, saturated and trans fat foods and limiting intake of processed foods  - Exercise:  I rec exercise (as tolerated)  - Tobacco: I rec avoidance of smoking  - Weight: I rec maintaining a healthy weight  - Prevention of chronic diseases: I rec eating healthy diet, wearing sun screen in sun,  not smoking. Limiting consumption of alcohol.    Cancer screening  - pap: 21-65;  2 2023 normal HPV and cytology, she is not a candidate for Pap anymore but she would like to do it, advised to check with Medicare  - mammo: 50-75; July 26, 2022, referral placed  - colonoscopy: 50-70(she reports she had a colonoscopy last year with Dr. Georgina Pillion, reports no polyps were found and was advised colonoscopy in 5 to 10 years she is not sure, advised records  -Dexa; 2022 osteopenia referral placed    Immunization:  Zoster: UTD   Pneumovax: 2023  Hepatitis-B ; UTD  Influenza:; declined  Tdap; allergic   covid-19 UTD     Assessment/Plan:      1. Annual physical exam (Primary)      2. Essential hypertension    - Comprehensive Metabolic Panel; Future  - Comprehensive Metabolic Panel    3. Prediabetes    - Hemoglobin A1C; Future  - Hemoglobin A1C    4. Mixed hyperlipidemia    - Lipid Panel; Future  - Lipid Panel    5. Screening mammogram, encounter for    - Mammo Screening 3D/Tomo Bilateral    6. Osteopenia, unspecified location      7. Postmenopausal    - Dxa Bone Density Axial Skeleton; Future    8. On statin therapy    - Lipid Panel; Future  - Lipid Panel    9. Screening for thyroid disorder    - TSH; Future  - Dxa Bone Density Axial Skeleton; Future  - TSH    10. Screening for deficiency anemia    - CBC with Differential (Order); Future  - CBC with Differential  (Order)    11. Screening for blood or protein in urine    - Urinalysis with Microscopic Exam; Future  - Urinalysis with Microscopic Exam    Body mass index is 27.4 kg/m.        Encouragement to Exercise      Mediterranean Diet: Fresh fruits, vegetables, salads, chicken without the skin, fish (salmon). Minimize red meat. Avoid bread, white flour, cakes, pasta, sugar as best you can. If not allergic, a handful of walnuts or almonds a day is a good snack instead of a sugar snack.  Good oils are olive oil, canola oil. Try to minimize saturated fats (solid fats): For example butter. Non fat milk is better alternative to whole milk or 2% milk.   Exercise: 45 minutes, 4 times a week-alternating between Aerobic and strengthening.     Increase vegetables, fruits, and fiber in your diet.  Increase unsaturated fats - olive oil, canola oil, nuts, seeds, avocados, fish  Decrease saturated and trans fats - red meat, fried foods, butter, foods with hydrogenated oils.  Decreased processed carbohydrates and sugar - crackers, cookies, pretzels, white bread, white rice, regular pasta, etc.  Exercise at least 30 minutes 5 days per week.    Fish oil or omega-3 supplement (2-4 gm daily) is okay to take and may lower your triglycerides.        Dr Windell Moment MD  Internist  [facility] [place]  [address], [place],  [place] [id number]  [phone]  Fax-[phone]

## 2023-07-13 NOTE — Patient Instructions (Signed)
 Wt Readings from Last 3 Encounters:   07/13/23 72.4 kg (159 lb 9.6 oz)   07/03/23 72.1 kg (159 lb)   06/26/23 71.2 kg (157 lb)      Body mass index is 27.4 kg/m.           BMI readings as below   18.9 to 24.9 -Normal   25-29.9-overweight  30-34.9-stage-1  35-39.9-stage 2  40-44.9-stage 3  >45-morbid       Mediterranean Diet: Fresh fruits, vegetables, salads, chicken without the skin, fish (salmon). Minimize red meat. Avoid bread, white flour, cakes, pasta, sugar as best you can. If not allergic, a handful of walnuts or almonds a day is a good snack instead of a sugar snack.  Good oils are olive oil, canola oil. Try to minimize saturated fats (solid fats): For example butter. Non fat milk is better alternative to whole milk or 2% milk.   Exercise: 45 minutes, 4 times a week-alternating between Aerobic and strengthening.     Increase vegetables, fruits, and fiber in your diet.  Increase unsaturated fats - olive oil, canola oil, nuts, seeds, avocados, fish  Decrease saturated and trans fats - red meat, fried foods, butter, foods with hydrogenated oils.  Decreased processed carbohydrates and sugar - crackers, cookies, pretzels, white bread, white rice, regular pasta, etc.  Exercise at least 30 minutes 5 days per week.    Fish oil or omega-3 supplement (2-4 gm daily) is okay to take and may lower your triglycerides.  MEDICARE WELLNESS PERSONAL PREVENTION PLAN   As part of the Medicare Wellness portion of your visit today, we are providing you with this personalized preventative plan of care. The list below includes many common screening recommendations from the USPSTF ([place] [employer]) but is not meant to be comprehensive. You may be eligible for other preventative services depending upon your personal risk factors.     Health Maintenance   Topic Date Due    Medicare Annual Wellness Visit  07/07/2022    PAP SMEAR  07/07/2023    Mammogram  07/26/2023    FALLS RISK ANNUAL  11/21/2023     DEPRESSION SCREENING  11/21/2023    Statin Use  12/19/2023    Colorectal Cancer Screening  07/04/2026    HEPATITIS C SCREENING  Completed    DXA Scan  Completed    Pneumonia Vaccine Age 22 Years and Older  Completed    INFLUENZA VACCINE  Discontinued    Shingrix Vaccine 50+  Discontinued    Tetanus Ten-Year  Discontinued    COVID-19 Vaccine  Discontinued     Health Maintenance Topics with due status: Overdue       Topic Date Due    Medicare Annual Wellness Visit 07/07/2022     Health Maintenance Topics with due status: Due On       Topic Date Due    PAP SMEAR 07/07/2023     Health Maintenance Topics with due status: Due Soon       Topic Date Due    Mammogram 07/26/2023      Immunization History   Administered Date(s) Administered    COVID-19 mRNA MONOVALENT vaccine PRIMARY SERIES 12 years and above [employer]) 30 mcg/0.3 mL (DILUTE BEFORE USE) 07/10/2019, 08/05/2019, 12/28/2019    Hepatitis B (adult dose) 07/05/2016, 11/13/2016, 04/18/2017    PNEUMOCOCCAL CONJUGATE 20-VALENT PF 07/06/2021    Zoster (SHINGRIX) vaccine, recombinant 02/02/2021, 04/20/2021        Your major risk factors: Prediabetes, Hypertension,  and Osteopenia  Recommendations for improvement:  Prediabetes - Optimize adherence with  low carbohydrate diet.  Here is a good reference website for tips on healthy eating, lowering carbohydrate intake and reducing risk of prediabetes.  https://www.niddk.nih.gov/health-information/diabetes/overview/preventing-type-2-diabetes/game-plan#:~:text=Eat%77fruits%2C%20vegetables%2C%20or%20a%20small%20handful%20of,your%20urge%20to%20snack.%20Tips%2for%20food%20shopping   Weight management - Here is a good reference website for tips on controlling your weight:  WealthyDonor.cz  Hypertension - Monitor your blood pressure daily. Here is a good reference website for tips on managing hypertension - MotivationalSites.no  Falls Prevention -  Here is a good reference website for tips on avoiding falls - BikingRewards.pl  Compliance with prescription medications  Make sure that you follow-up with your specialists on a regular basis    Colorectal Cancer Screening - All adults 45-75 yrs should undergo periodic colorectal cancer screening. The decision to screen for colorectal cancer in adults aged 68 to 23 years should be an individual one, taking into account your overall health and prior screening history.   Breast Cancer Screening - Women aged 40-69yrs should have mammograms every other year (please note that this recommendation may not be appropriate for every woman - your physician can answer specific questions you may have). The USPSTF concludes that the current evidence is insufficient to assess the balance of benefits and harms of screening mammography in women aged 83 years or older.  These recommendations do not apply to persons who have a genetic marker or syndrome associated with a high risk of breast cancer (eg, BRCA1 or BRCA2 genetic variation), a history of high-dose radiation therapy to the chest at a young age, or previous breast cancer or a high-risk breast lesion on previous biopsies.   Cervical Cancer Screening - Women over 24 do not require pap smears as long as prior screening has been normal and are not otherwise at high risk for cervical cancer. The USPSTF recommends screening for cervical cancer every 3 years with cervical cytology alone in women aged 63 to 2 years. For women aged 37 to 90 years, the USPSTF recommends screening every 3 years with cervical cytology alone, every 5 years with high-risk human papillomavirus (hrHPV) testing alone, or every 5 years with hrHPV testing in combination with cytology (cotesting).   Osteoporosis Screening -  The USPSTF recommends screening for osteoporosis with bone measurement testing to prevent osteoporotic fractures in women 65 years and older.  For  postmenopausal women younger than 65 years who are at increased risk of osteoporosis, as determined by a formal clinical risk assessment tool, the USPSTF recommends screening for osteoporosis with bone measurement testing to prevent osteoporotic fractures.   Hepatitis C Screening - Recommend screening for hepatitis C virus (HCV) infection in all adults aged 7 to 104 years.  Lung cancer Screening - Recommend annual screening for lung cancer with low-dose computed tomography (LDCT) in adults ages 77 to 18 years who have a 20 pack-year smoking history and currently smoke or have quit within the past 15 years.  Recommended Vaccinations from the CDC (U.S.  Centers for Disease Control and Prevention)   Influenza one dose annually   COVID vaccine - stay current with the most up-to-date COVID update/booster  Tetanus/diphtheria (Tdap) one booster every 10 years   Zoster/Shingles - (Shingrix) two doses after age 61 (second dose given 2-6 months after first dose)  Pneumococcal 20-valent or 21-valent conjugate vaccine - one dose for adults aged >=65 years with no history of prior pneumococcal vaccination.  Pneumococcal 20-valent or 21-valent conjugate vaccine -  one dose for adults aged >=65 years with PPSV23 only or  PCV13 only given more than 1 yr ago.  Pneumococcal 20-valent or 21-valent conjugate vaccine - one dose for adults aged >=65 years who have completed PCV-13 and PPSV23 after 68yrs old and it has been greater than 68yrs (shared clinical decision-making is recommended regarding administration of this vaccine).   RSV (Respiratory Syncytial Virus) vaccine for everyone ages 33 and older  RSV (Respiratory Syncytial Virus)  vaccine ages 67-74 who are at increased risk of severe RSV disease (for example, chronic heart/lung/liver/kidney disease, immunocompromised)    PERSONAL PREVENTION PLAN   Your Personal Prevention Plan is based on your overall health and your responses to the health questionnaire you completed. The  following information is for you to review in addition to the recommendations, referrals, and tests we have discussed at your visit.     Physical Activity:   Physical activity can help you maintain a healthy weight, prevent or control illness, reduce stress, and sleep better. It can also help you improve your balance to avoid falls. Try to build up to and maintain a total of 30 minutes of activity each day. If you are able, try walking, doing yard or housework, and taking the stairs more often. You can also strengthen your muscles with exercises done while sitting or lying down. Web resource - https://www.carpenter-henry.info/  Emotional Health:   Feeling "down in the dumps" or anxious every now and then is a natural part of life. If this feeling lasts for a few weeks or more, talk with me as soon as possible. It could be a sign of a problem that needs treatment. There are many types of treatment available. Web resource -  RXPreview.de  Falls:   You can reduce your risk of falling by making changes in your home. Remove items that may cause tripping, improve lighting, and consider installing grab bars.   Talk with me if you have problems with balance and walking. To prevent falls, you may need your vision, hearing, or blood pressure checked. Exercises to improve your strength and balance, or using a cane or walker, may help. Review your medicines with me at every visit, because some can affect balance. Please be sure to let me know if you fall or are fearful you may fall. Web resource - BounceThru.fi  Urinary Leakage:   Urine leakage is common, but it is not a normal part of aging. Talk with me about any urine leakage so that the cause can be found and treated. Treatment can include bladder training, exercises, medicine or surgery.   Pain:   We all have aches and pains at times, but chronic pain can change how you feel and live every day. Please talk with  me about any symptoms of chronic pain so that we can determine how best to treat.   Sleep:   Getting a good night's sleep is vital to your health and well-being and can help prevent or manage health problems. Often, sleep can be improved by changing behaviors, including when you go to bed and what you do before bed. Sleep apnea can cause problems such as struggling to stay awake during the day. Please let me know if you would like to learn more about improving your sleep and/or think you may have sleep apnea. Web resource -  ThousandQuestions.com.cy  Seat Belt:   Please remember to wear a seat belt when driving or riding in a vehicle. It is one of the most important things you can do to stay safe in a car.  Nutrition:   Remember to eat plenty of fruits, vegetables, whole grains, and dairy. Drink at least 64 ounces (8 full glasses) of water a day, unless you have been advised to limit fluids.  Web resource- PickSeat.dk  Alcohol:   Alcohol can have a greater effect on older people, who may feel its effects at a lower amount. Older people should limit alcoholic drinks (no more than one a day for women and no more than two a day for men). Please let me know if alcohol use becomes a problem.  Web resource - AgingMortgage.ca  Tobacco:   Not smoking or using other forms of tobacco is one of the most important things you can do for your health. Here is some more information about the importance of quitting smoking and how to quit smoking - Mudlogger - BroadJournal.com.pt  Advance Directives:   There may come a time when medical decisions need to be made on your behalf. Please talk with your family, and with me, about your wishes. It is important to provide information about your decisions, and any formal advance directives, for your medical record. Here is additional information on advanced directives - Web  resource - MediaExhibitions.no  Additional Support:   Sometimes it can be challenging to manage all aspects of daily life. Finding the right support can help you maintain or improve your health and independence. Please let me know if you would like to talk further about finding resources to assist you.

## 2023-07-16 ENCOUNTER — Ambulatory Visit (INDEPENDENT_AMBULATORY_CARE_PROVIDER_SITE_OTHER): Payer: Self-pay | Admitting: Family Nurse Practitioner

## 2023-07-16 ENCOUNTER — Encounter (INDEPENDENT_AMBULATORY_CARE_PROVIDER_SITE_OTHER): Admitting: Nurse Practitioner

## 2023-07-17 ENCOUNTER — Encounter (INDEPENDENT_AMBULATORY_CARE_PROVIDER_SITE_OTHER): Payer: Self-pay | Admitting: Family Nurse Practitioner

## 2023-07-17 DIAGNOSIS — I10 Essential (primary) hypertension: Secondary | ICD-10-CM

## 2023-07-18 ENCOUNTER — Telehealth (INDEPENDENT_AMBULATORY_CARE_PROVIDER_SITE_OTHER): Payer: Self-pay

## 2023-07-18 MED ORDER — HYDROCHLOROTHIAZIDE 25 MG PO TABS
25.0000 mg | ORAL_TABLET | Freq: Every day | ORAL | 3 refills | Status: DC
Start: 2023-07-18 — End: 2023-08-08

## 2023-07-18 NOTE — Telephone Encounter (Signed)
 Received inbasket message from the patient, who states that she has tried Zetia  in the past and it did not work for her. She feels that the Nexlizet  is the first medication that has brought her LDL down. I did educate her on the goal of having an LDL of less than 70 to help ameliorate risk for CAD.   She also states that increasing her losartan  since her last office visit on 03/11 has not helped her blood pressure at all. She is now taking 50mg  of losartan . When she wakes up and takes her BP, it is 160/90, with systolic decreasing into the 140s after the medication is in full effect. She states that her BP was much more well-controlled when she was taking HCTZ, but that her PCP switched her to Losartan  several years ago.   I told the patient that I will speak with Bartley Mai NP regarding her thoughts and get back to her. Patient does have a follow up with Bartley Mai NP on 04/10.

## 2023-08-02 ENCOUNTER — Encounter (INDEPENDENT_AMBULATORY_CARE_PROVIDER_SITE_OTHER): Admitting: Family Nurse Practitioner

## 2023-08-08 ENCOUNTER — Encounter (INDEPENDENT_AMBULATORY_CARE_PROVIDER_SITE_OTHER): Payer: Self-pay | Admitting: Family Nurse Practitioner

## 2023-08-08 ENCOUNTER — Encounter (INDEPENDENT_AMBULATORY_CARE_PROVIDER_SITE_OTHER): Payer: Self-pay

## 2023-08-08 ENCOUNTER — Ambulatory Visit (INDEPENDENT_AMBULATORY_CARE_PROVIDER_SITE_OTHER): Admitting: Family Nurse Practitioner

## 2023-08-08 VITALS — BP 164/92 | HR 72 | Ht 64.0 in | Wt 159.0 lb

## 2023-08-08 DIAGNOSIS — R9439 Abnormal result of other cardiovascular function study: Secondary | ICD-10-CM

## 2023-08-08 DIAGNOSIS — I10 Essential (primary) hypertension: Secondary | ICD-10-CM

## 2023-08-08 DIAGNOSIS — E782 Mixed hyperlipidemia: Secondary | ICD-10-CM

## 2023-08-08 MED ORDER — OLMESARTAN MEDOXOMIL 20 MG PO TABS
20.0000 mg | ORAL_TABLET | Freq: Every day | ORAL | 3 refills | Status: DC
Start: 2023-08-08 — End: 2023-12-06

## 2023-08-08 NOTE — Progress Notes (Signed)
 Received notification from [facility] concerning the patient's Nexilet. The patient currently has a PA until 09/2023. Sent notification via e-mail to Sari Cunning, RN who has manged her PA in the past.

## 2023-08-08 NOTE — Progress Notes (Signed)
 [place]  HEART CARDIOLOGY OFFICE PROGRESS NOTE    HRT [place] [facility]ART [place]  [place]  HEART [facility] OFFICE - CARDIOLOGY  [address]  [place] [address]  Dept: [phone]  Dept Fax: [phone]       Patient Name: Wendy [name]    Date of Visit:  August 08, 2023  Date of Birth: [birth year]  AGE: 68 y.o.  Medical Record #: [id number]  Requesting Physician: Birda Buffy, MD      CHIEF COMPLAINT: Essential hypertension      HISTORY OF PRESENT ILLNESS:    She is a pleasant 68 y.o. female who presents today for follow up evaluation of hypertension. She reports that she was switched from Losartan  to HCTZ without improvement of BP. Home BP averaging around 160 systolic.     She denies chest pain, worsening shortness of breath, dizziness, or palpitations.    PAST MEDICAL HISTORY: She has a past medical history of Brain tumor (benign) (CMS/HCC), Hypercholesteremia, and Hypertensive disorder. She has a past surgical history that includes Brain surgery (2009); Corneal transplant (2013); Breast biopsy (Right, 2008); Breast biopsy (Right, 1970s); Breast cyst excision (1970s); Eye surgery; Cesarean section; Tubal ligation; EXCHANGE, CORNEAL IMPLANT (Left); ARTHROPLASTY, KNEE, TOTAL MAKOPLASTY (Left, 08/03/2020); Knee surgery (Left, 08/03/2020); and left foot surgery .    Allergies  Reviewed by Lolita Rise on 08/08/2023        Severity Reactions Comments    Iodine High Shortness Of Breath     Penicillins High Shortness Of Breath Has patient had a PCN reaction causing immediate rash, facial/tongue/throat swelling, SOB or lightheadedness with hypotension: Yes Has patient had a PCN reaction causing severe rash involving mucus membranes or skin necrosis: No Has patient had a PCN reaction that required hospitalization: No Has patient had a PCN reaction occurring within the last 10 years: Yes If all of the above answers are "NO", then may proceed with Cephalosporin use.     Shellfish-derived Products High Anaphylaxis     Atorvastatin  Not Specified  Joint pain     Morphine Not Specified  Itchy     Tetanus Immune Globulin Not Specified  Arm swelling     Zocor  [simvastatin ] Not Specified  No response              MEDICATIONS:   Patient's current medications were reviewed. ONLY Cardiac medications were updated unless others were addressed in assessment and plan.  Current Outpatient Medications (Relevant to Cardiology)   Medication Sig    acetaZOLAMIDE  Take 1 tablet (125 mg) by mouth once as needed (HA)    Nexlizet  Take 1 tablet by mouth daily    olmesartan  Take 1 tablet (20 mg) by mouth daily     Current Outpatient Medications (Other)   Medication Sig    multivitamin Take 1 tablet by mouth daily    co-enzyme Q-10 Take 1 capsule (50 mg) by mouth daily    prednisoLONE  acetate Place 1 drop into the left eye 4 (four) times daily    nortriptyline  Take 1 capsule (10 mg) by mouth daily as needed (Headache)    fluticasone  1 spray by Nasal route daily    nystatin  Apply topically 2 (two) times daily    triamcinolone  Apply topically 2 (two) times daily    ibuprofen  Take 1 tablet (800 mg) by mouth every 6 (six) hours as needed for Pain          FAMILY HISTORY: family history includes Diabetes in her brother, father, and mother;  Hyperlipidemia in her mother; Hypertension in her father and mother; Stroke in her mother.    SOCIAL HISTORY: She reports that she has never smoked. She has never used smokeless tobacco. She reports current alcohol use of about 1.0 - 2.0 standard drink of alcohol per week. She reports that she does not use drugs.    PHYSICAL EXAMINATION    Visit Vitals  BP (!) 164/92 (BP Site: Left arm, Patient Position: Sitting, Cuff Size: Medium)   Pulse 72   Ht 1.626 m (5\' 4" )   Wt 72.1 kg (159 lb)   BMI 27.29 kg/m       Constitutional: Cooperative, alert, no acute distress.  Neck: No carotid bruits, JVP normal.  Cardiac: Regular rate and rhythm, No murmurs. No rubs, no gallops.  Pulmonary:  Clear to auscultation bilaterally, no wheezing, no rhonchi, no rales.  Extremities: no edema.  Vascular: +2 pulses in radial artery bilaterally, 2+ pedal pulses bilaterally.    BP Readings from Last 3 Encounters:   08/08/23 (!) 164/92   07/13/23 129/83   07/03/23 164/82     Wt Readings from Last 3 Encounters:   08/08/23 72.1 kg (159 lb)   07/13/23 72.4 kg (159 lb 9.6 oz)   07/03/23 72.1 kg (159 lb)        LABS REVIEWED:   Lab Results   Component Value Date    WBC 4.23 07/13/2023    HGB 12.3 07/13/2023    HCT 38.8 07/13/2023    PLT 420 (H) 07/13/2023     Lab Results   Component Value Date    GLU 88 07/13/2023    BUN 20 07/13/2023    CREAT 0.7 07/13/2023    NA 140 07/13/2023    K 4.2 07/13/2023    CL 105 07/13/2023    CO2 27 07/13/2023    AST 24 07/13/2023    ALT 21 07/13/2023     Lab Results   Component Value Date    TSH 0.75 07/13/2023    HGBA1C 6.0 (H) 07/13/2023     Lab Results   Component Value Date    CHOL 209 (H) 07/13/2023    TRIG 75 07/13/2023    HDL 53 07/13/2023    LDL 141 (H) 07/13/2023     No results found for: "LPACHOL"     Most recent CT cardiac scoring and nuclear study reviewed.    IMPRESSION:   Ms. Kuhr is a 68 y.o. female with the following problems:    Hypertension, continued elevated BP readings  Hyperlipidemia, improving   Intolerant to simvastatin  and rosuvastatin  -neck itching when taken concomitantly with antihypertensive medication  No aortic aneurysm (sees abdominal/pelvis CT scan May 30, 2022)  Abnormal nuc stress test likely from breast attenuation defect with subsequent CT score of 0, Oct 2024. No anginal equivalent symptoms with high levels of exercises.      RECOMMENDATIONS:    Discontinue HCTZ and begin 20 mg Olmesartan  daily for blood pressure. Repeat BMP in two weeks to check kidney function and electrolytes. If BP remains elevated on Olmesartan , could consider adding HCTZ back in with combination pill.  Continue Nexlizet  with repeat LFT/Lipid panel in 3 months. Briefly discussed  Repatha as option, however patient is not interested in injection medications at this time.  Follow up in 1 month with APP  Orders Placed This Encounter   Procedures    Basic Metabolic Panel    Lipid Panel    Hepatic Function Panel (LFT)    APP Office Visit (HRT [place])       Orders Placed This Encounter   Medications    olmesartan  (BENICAR ) 20 MG tablet     Sig: Take 1 tablet (20 mg) by mouth daily     Dispense:  90 tablet     Refill:  3       SIGNED:    Worthy Heads, FNP        Please pardon any potential grammatical errors or typos as aspects of this note may have been created through speech-to-text software.

## 2023-08-09 ENCOUNTER — Ambulatory Visit
Admission: RE | Admit: 2023-08-09 | Discharge: 2023-08-09 | Disposition: A | Discharge status: Home / Self Care | Source: Ambulatory Visit | Attending: Internal Medicine | Admitting: Internal Medicine

## 2023-08-09 DIAGNOSIS — M8580 Other specified disorders of bone density and structure, unspecified site: Secondary | ICD-10-CM | POA: Insufficient documentation

## 2023-08-09 DIAGNOSIS — Z780 Asymptomatic menopausal state: Secondary | ICD-10-CM

## 2023-08-09 DIAGNOSIS — Z1231 Encounter for screening mammogram for malignant neoplasm of breast: Secondary | ICD-10-CM | POA: Insufficient documentation

## 2023-08-14 NOTE — Progress Notes (Signed)
 Prior Authorization request for Nexlizet  180-10 mg #90:    MWU:[id number]  CaseId:[id number]  Status:submitted  Coverage End Date:pending    Update: pending

## 2023-08-15 ENCOUNTER — Encounter (INDEPENDENT_AMBULATORY_CARE_PROVIDER_SITE_OTHER): Payer: Self-pay

## 2023-08-28 ENCOUNTER — Ambulatory Visit (INDEPENDENT_AMBULATORY_CARE_PROVIDER_SITE_OTHER): Admitting: Foot & Ankle Surgery

## 2023-08-28 ENCOUNTER — Encounter (INDEPENDENT_AMBULATORY_CARE_PROVIDER_SITE_OTHER): Payer: Self-pay | Admitting: Foot & Ankle Surgery

## 2023-08-28 VITALS — BP 145/84 | HR 74 | Ht 64.0 in | Wt 159.0 lb

## 2023-08-28 DIAGNOSIS — M7752 Other enthesopathy of left foot: Secondary | ICD-10-CM

## 2023-08-28 DIAGNOSIS — M2042 Other hammer toe(s) (acquired), left foot: Secondary | ICD-10-CM

## 2023-08-28 DIAGNOSIS — L84 Corns and callosities: Secondary | ICD-10-CM

## 2023-08-28 NOTE — Progress Notes (Signed)
 Podiatric Surgery Patient Visit    Patient Name: [place] [name],Wendy [name]  Assessment & Plan:   -Patient is 68 y.o. female.   Assessment & Plan  Postoperative Follow-Up for Left Toe Surgery  Follow-up after left toe surgery on June 10, 2022.   Approximately two and a half months post-surgery for left toe. Surgical scars on the second and great toes of the left foot are well healed with minimal tenderness.  Skin around scars may darken but should lighten over time. Interdigital corn has resolved completely. Residual surgical soreness and mild swelling are present but improving. Continued improvement is anticipated with ongoing care.  - Continue massaging the scars to aid healing.  - Apply scar cream and moisturizer to the scars.  - Use sunscreen on new scars to prevent darkening if exposed to sun.  Resume all normal activity as tolerated    Follow up as needed         Subjective:     Chief Complaint:   Chief Complaint   Patient presents with    Post-op     DOS  06-11-23, hammertoe 2nd left, doing well       HPI:  [name] is a 68 y.o. female.   History of Present Illness  The patient, with a history of left toe surgery on 06/10/2022, presents for a follow-up visit.   [name] is a 68 year old female who presents for follow-up after toe surgery.    Two and a half months post-surgery on her left foot, the scars on the second and great toes are improving. She is using scar cream and moisturizer to aid healing. Wearing closed shoes is tolerable, though she experiences some stiffness and mild soreness in the back of the foot. Swelling is gradually decreasing, and she finds sandals more comfortable. She has been using flip flops and notes diminishing overall soreness. There are no significant activity restrictions, with only mild soreness and stiffness. There is no significant pain in the toes, and swelling is reducing.         Physical Exam:     Vitals:    08/28/23 1035   BP: 145/84   Pulse: 74         Gen: AAOx3,  NAD  LOWER EXTREMITY EXAM:  Physical Exam      MUSCULOSKELETAL: Surgical scars on the second and great toe of the left foot are well healed with minimal tenderness. No significant thickening or abnormal scar tissue. Very mild edema present. Interdigital corn resolved, no longer painful.         Radiology:     Xrays taken in clinic today: NA      Results             Carlos Chesterfield, DPM [facility]  [facility] Podiatric Surgery  Pager [phone]

## 2023-08-29 ENCOUNTER — Other Ambulatory Visit

## 2023-08-29 DIAGNOSIS — E782 Mixed hyperlipidemia: Secondary | ICD-10-CM

## 2023-08-29 LAB — HEPATIC FUNCTION PANEL (LFT)
ALT: 20 U/L (ref <=55)
AST (SGOT): 29 U/L (ref <=41)
Albumin/Globulin Ratio: 1.3 (ref 0.9–2.2)
Albumin: 3.9 g/dL (ref 3.5–5.0)
Alkaline Phosphatase: 65 U/L (ref 37–117)
Bilirubin Direct: 0.1 mg/dL (ref 0.0–0.5)
Bilirubin Indirect: 0.2 mg/dL (ref 0.2–1.0)
Bilirubin, Total: 0.3 mg/dL (ref 0.2–1.2)
Globulin: 3 g/dL (ref 2.0–3.6)
Hemolysis Index: 11 {index}
Protein, Total: 6.9 g/dL (ref 6.0–8.3)

## 2023-08-29 LAB — LIPID PANEL
Cholesterol / HDL Ratio: 3.8 {index}
Cholesterol: 195 mg/dL (ref <=199)
HDL: 52 mg/dL (ref >=40)
LDL Calculated: 128 mg/dL — ABNORMAL HIGH (ref 0–99)
Triglycerides: 76 mg/dL (ref 34–149)
VLDL Calculated: 15 mg/dL (ref 10–40)

## 2023-09-05 ENCOUNTER — Encounter (INDEPENDENT_AMBULATORY_CARE_PROVIDER_SITE_OTHER): Payer: Self-pay

## 2023-09-05 ENCOUNTER — Other Ambulatory Visit (INDEPENDENT_AMBULATORY_CARE_PROVIDER_SITE_OTHER): Payer: Self-pay

## 2023-09-07 ENCOUNTER — Encounter (INDEPENDENT_AMBULATORY_CARE_PROVIDER_SITE_OTHER): Payer: Self-pay | Admitting: Family Nurse Practitioner

## 2023-09-07 ENCOUNTER — Ambulatory Visit (INDEPENDENT_AMBULATORY_CARE_PROVIDER_SITE_OTHER): Admitting: Family Nurse Practitioner

## 2023-09-07 VITALS — BP 142/90 | HR 68 | Ht 64.0 in | Wt 160.0 lb

## 2023-09-07 DIAGNOSIS — I10 Essential (primary) hypertension: Secondary | ICD-10-CM

## 2023-09-07 DIAGNOSIS — E782 Mixed hyperlipidemia: Secondary | ICD-10-CM

## 2023-09-07 NOTE — Progress Notes (Signed)
 [place]  HEART CARDIOLOGY OFFICE PROGRESS NOTE    HRT [place] [facility] [place]  [place]  HEART [facility] OFFICE - CARDIOLOGY  [address]  [place] [address]  Dept: [phone]  Dept Fax: [phone]       Patient Name: Wendy [name]    Date of Visit:  Sep 07, 2023  Date of Birth: Jan 13, 1956  AGE: 68 y.o.  Medical Record #: [id number]  Requesting Physician: Kaleta Hoes, MD    CHIEF COMPLAINT: Hypertension     HISTORY OF PRESENT ILLNESS:    She is a pleasant 68 y.o. female who presents today for follow up. Following our last visit in March when we switched from hctz to losartan  for better BP control, NP [place] switched her over to olmesartan  for better BP control. Since the change in the past month, her pressures have been relatively the same SBP 130-140s over 80 to 90s. She believes she had her repeat BMP done through primary last week when she had her cholesterol checked.  Otherwise, she is in need chest pain, dyspnea, presyncope/syncope, or palpitations.    Recent fasting lipids TC 195, HDL 52, LDL 128, triglycerides 76.    PAST MEDICAL HISTORY: She has a past medical history of Brain tumor (benign) (CMS/HCC), Hypercholesteremia, and Hypertensive disorder. She has a past surgical history that includes Brain surgery (2009); Corneal transplant (2013); Breast biopsy (Right, 2008); Breast biopsy (Right, 1970s); Breast cyst excision (1970s); Eye surgery; Cesarean section; Tubal ligation; EXCHANGE, CORNEAL IMPLANT (Left); ARTHROPLASTY, KNEE, TOTAL MAKOPLASTY (Left, 08/03/2020); Knee surgery (Left, 08/03/2020); and left foot surgery .    ALLERGIES:   Allergies  Review status set to Review Complete by Croup, Lynda on 08/28/2023        Severity Reactions Comments    Iodine High Shortness Of Breath     Penicillins High Shortness Of Breath Has patient had a PCN reaction causing immediate rash, facial/tongue/throat swelling, SOB or lightheadedness with hypotension: Yes Has patient had  a PCN reaction causing severe rash involving mucus membranes or skin necrosis: No Has patient had a PCN reaction that required hospitalization: No Has patient had a PCN reaction occurring within the last 10 years: Yes If all of the above answers are NO, then may proceed with Cephalosporin use.    Shellfish-derived Products High Anaphylaxis     Atorvastatin  Not Specified  Joint pain     Morphine Not Specified  Itchy     Tetanus Immune Globulin Not Specified  Arm swelling     Zocor  [simvastatin ] Not Specified  No response             MEDICATIONS:   Patient's current medications were reviewed. ONLY Cardiac medications were updated unless others were addressed in assessment and plan.  Current Outpatient Medications   Medication Instructions    acetaZOLAMIDE  (DIAMOX ) 125 mg, Oral, RT - Once as needed    Bempedoic Acid-Ezetimibe  (Nexlizet ) 180-10 MG Tab 1 tablet, Oral, Daily    co-enzyme Q-10 50 mg, Daily    fluticasone  (FLONASE ) 50 MCG/ACT nasal spray 1 spray, Nasal, Daily    ibuprofen  (ADVIL ) 800 mg, Oral, Every 6 hours PRN    Multiple Vitamin (MULTIVITAMIN) tablet 1 tablet, Daily    nortriptyline  (PAMELOR ) 10 mg, Oral, Daily PRN    nystatin  (MYCOSTATIN ) ointment Topical, 2 times daily    olmesartan  (BENICAR ) 20 mg, Oral, Daily    prednisoLONE  acetate (PRED FORTE ) 1 % ophthalmic suspension 1 drop, Left Eye, 4 times daily  triamcinolone  (KENALOG ) 0.025 % ointment Topical, 2 times daily        FAMILY HISTORY: family history includes Diabetes in her brother, father, and mother; Hyperlipidemia in her mother; Hypertension in her father and mother; Stroke in her mother.    SOCIAL HISTORY: She reports that she has never smoked. She has never used smokeless tobacco. She reports current alcohol use of about 1.0 - 2.0 standard drink of alcohol per week. She reports that she does not use drugs.    PHYSICAL EXAMINATION    Visit Vitals  BP 142/90 (BP Site: Left arm, Patient Position: Sitting, Cuff Size: Medium)   Pulse 68   Ht  1.626 m (5' 4)   Wt 72.6 kg (160 lb)   BMI 27.46 kg/m     Constitutional: Cooperative, alert, no acute distress.  Neck: No carotid bruits, JVP normal.  Cardiac: Regular rate and rhythm, normal S1 and S2. No MGR detected.  Pulmonary: CTAB. No WRR.  Extremities: No edema.  Vascular: +2 pulses in radial artery bilaterally, 2+ pedal pulses bilaterally.    ECG: N/A    LABS:   Lab Results   Component Value Date    WBC 4.23 07/13/2023    HGB 12.3 07/13/2023    HCT 38.8 07/13/2023    PLT 420 (H) 07/13/2023     Lab Results   Component Value Date    GLU 88 07/13/2023    BUN 20 07/13/2023    CREAT 0.7 07/13/2023    NA 140 07/13/2023    K 4.2 07/13/2023    CL 105 07/13/2023    CO2 27 07/13/2023    AST 29 08/29/2023    ALT 20 08/29/2023     Lab Results   Component Value Date    TSH 0.75 07/13/2023    HGBA1C 6.0 (H) 07/13/2023     Lab Results   Component Value Date    CHOL 195 08/29/2023    TRIG 76 08/29/2023    HDL 52 08/29/2023    LDL 128 (H) 08/29/2023     The 10-year ASCVD risk score (Arnett DK, et al., 2019) is: 12.5%    Values used to calculate the score:      Age: 60 years      Sex: Female      Is Non-Hispanic African American: Yes      Diabetic: No      Tobacco smoker: No      Systolic Blood Pressure: 142 mmHg      Is BP treated: Yes      HDL Cholesterol: 52 mg/dL      Total Cholesterol: 195 mg/dL     Most recent lab study reviewed.    IMPRESSION:   [name] is a 68 y.o. female with the following problems:    Hypertension, suboptimal  Hyperlipidemia, LDL 128 May 2025.   Intolerant to simvastatin  and rosuvastatin  -neck itching when taken concomitantly with antihypertensive medication  No aortic aneurysm (sees abdominal/pelvis CT scan May 30, 2022)  Abnormal nuc stress test likely from breast attenuation defect with subsequent CT score of 0, Oct 2024. No anginal equivalent symptoms with high levels of exercises.    RECOMMENDATIONS:    Recommend increasing olmesartan  to 40 mg daily for better BP control.  Patient  refused and would like to work on aggressive lifestyle modifications first before making any additional medication changes.  Will attempt to get recent BMP from primary care.  Patient agreeable on rechecking BMP if this is not  available.  For now continue current cardiac medications.  Check blood pressure and heart rate daily.  Call sooner if systolic blood pressure sustains less than 100s or greater than 140s, or heart rate sustains less than 50s or greater than 110s.  Lifestyle modifications per American Heart Association.  Practice aggressive lifestyle modifications according to American heart Association, including 150 minutes of moderate to high intensity exercises and Mediterranean style diet.  APP visit in 3 months for reinforcement of lifestyle modifications and close monitoring of vitals.                                               Orders Placed This Encounter   Procedures    APP Office Visit (HRT [place])     No orders of the defined types were placed in this encounter.    SIGNED:    Bartley Cheril Mai, FNP    Please pardon any potential grammatical errors or typos as aspects of this note may have been created through speech-to-text software. Thank you.

## 2023-12-06 ENCOUNTER — Ambulatory Visit (INDEPENDENT_AMBULATORY_CARE_PROVIDER_SITE_OTHER): Admitting: Family Nurse Practitioner

## 2023-12-06 ENCOUNTER — Encounter (INDEPENDENT_AMBULATORY_CARE_PROVIDER_SITE_OTHER): Payer: Self-pay | Admitting: Family Nurse Practitioner

## 2023-12-06 VITALS — BP 136/78 | HR 79 | Ht 64.0 in | Wt 166.0 lb

## 2023-12-06 DIAGNOSIS — E782 Mixed hyperlipidemia: Secondary | ICD-10-CM

## 2023-12-06 DIAGNOSIS — I10 Essential (primary) hypertension: Secondary | ICD-10-CM

## 2023-12-06 LAB — ECG 12-LEAD
Atrial Rate: 79 {beats}/min
P Axis: 77 degrees
P-R Interval: 108 ms
Q-T Interval: 392 ms
QRS Duration: 82 ms
QTC Calculation (Bezet): 449 ms
R Axis: 58 degrees
T Axis: 55 degrees
Ventricular Rate: 79 {beats}/min

## 2023-12-06 MED ORDER — OLMESARTAN MEDOXOMIL 20 MG PO TABS: 20.0000 mg | ORAL_TABLET | Freq: Every day | ORAL | 3 refills | Status: DC

## 2023-12-06 MED ORDER — NEXLIZET 180-10 MG PO TABS: 1.0000 | ORAL_TABLET | Freq: Every day | ORAL | 3 refills | Status: DC

## 2023-12-06 NOTE — Progress Notes (Signed)
 [name]  HEART CARDIOLOGY OFFICE PROGRESS NOTE    HRT [place] HEART [place]  [place]  HEART [facility] OFFICE - CARDIOLOGY  [address]  [place] [address]  Dept: [phone]  Dept Fax: [phone]     Patient Name: Wendy [name]    Date of Visit:  December 06, 2023  Date of Birth: 10/10/1955  AGE: 68 y.o.  Medical Record #: [id number]  Requesting Physician: Wendy Hoes, MD    CHIEF COMPLAINT: Hypertension     HISTORY OF PRESENT ILLNESS:    She is a pleasant 68 y.o. female who presents today for follow up. Since her last visit with me, she has been doing well and denies any chest pain, dyspnea, or other cardiac symptoms. She has continued to stay physically active, recently went to [name] with her extended family and denies any exertional symptoms.  She is continuing to work on lifestyle modifications for lipid control. Reports home SBP 120-130s, occasionally 140s.      PAST MEDICAL HISTORY: She has a past medical history of Brain tumor (benign) (CMS/HCC), Hypercholesteremia, and Hypertensive disorder. She has a past surgical history that includes Brain surgery (2009); Corneal transplant (2013); Breast biopsy (Right, 2008); Breast biopsy (Right, 1970s); Breast cyst excision (1970s); Eye surgery; Cesarean section; Tubal ligation; EXCHANGE, CORNEAL IMPLANT (Left); ARTHROPLASTY, KNEE, TOTAL MAKOPLASTY (Left, 08/03/2020); Knee surgery (Left, 08/03/2020); and left foot surgery .    Allergies  Reviewed by Wendy Bartley Roman, FNP on 09/07/2023        Severity Reactions Comments    Iodine High Shortness Of Breath     Penicillins High Shortness Of Breath Has patient had a PCN reaction causing immediate rash, facial/tongue/throat swelling, SOB or lightheadedness with hypotension: Yes Has patient had a PCN reaction causing severe rash involving mucus membranes or skin necrosis: No Has patient had a PCN reaction that required hospitalization: No Has patient had a PCN  reaction occurring within the last 10 years: Yes If all of the above answers are NO, then may proceed with Cephalosporin use.    Shellfish-derived Products High Anaphylaxis     Atorvastatin  Not Specified  Joint pain     Morphine Not Specified  Itchy     Tetanus Immune Globulin Not Specified  Arm swelling     Zocor  [simvastatin ] Not Specified  No response              MEDICATIONS:   Patient's current medications were reviewed. ONLY Cardiac medications were updated unless others were addressed in assessment and plan.    Current Outpatient Medications (Relevant to Cardiology)   Medication Sig    acetaZOLAMIDE  Take 1 tablet (125 mg) by mouth once as needed (HA)    olmesartan  Take 1 tablet (20 mg) by mouth once daily    Nexlizet  Take 1 tablet by mouth once daily     Current Outpatient Medications (Other)   Medication Sig    multivitamin Take 1 tablet by mouth daily    co-enzyme Q-10 Take 1 capsule (50 mg) by mouth daily    prednisoLONE  acetate Place 1 drop into the left eye 4 (four) times daily (Patient taking differently: Place 1 drop into the left eye as needed)    nortriptyline  Take 1 capsule (10 mg) by mouth daily as needed (Headache)    fluticasone  1 spray by Nasal route daily (Patient taking differently: 1 spray by Nasal route as needed)    nystatin  Apply topically 2 (two) times daily (Patient taking differently: Apply  topically as needed)    triamcinolone  Apply topically 2 (two) times daily (Patient taking differently: Apply topically as needed)    ibuprofen  Take 1 tablet (800 mg) by mouth every 6 (six) hours as needed for Pain     FAMILY HISTORY: family history includes Diabetes in her brother, father, and mother; Hyperlipidemia in her mother; Hypertension in her father and mother; Stroke in her mother.    SOCIAL HISTORY: She reports that she has never smoked. She has never used smokeless tobacco. She reports current alcohol use of about 1.0 - 2.0 standard drink of alcohol per week. She reports that she does  not use drugs.    PHYSICAL EXAMINATION    Visit Vitals  BP 136/78 (BP Site: Left arm, Patient Position: Sitting, Cuff Size: Medium)   Pulse 79   Ht 1.626 m (5' 4)   Wt 75.3 kg (166 lb)   BMI 28.49 kg/m     Constitutional: Cooperative, alert, no acute distress.  Neck: No carotid bruits, JVP normal.  Cardiac: Regular rate and rhythm, normal S1 and S2; no S3 or S4. No murmurs. No rubs, no gallops.  Pulmonary: Clear to auscultation bilaterally, no wheezing, no rhonchi, no rales.  Extremities: no edema.  Vascular: +2 pulses in radial artery bilaterally, 2+ pedal pulses bilaterally.    ECG: NA    LABS REVIEWED:   Lab Results   Component Value Date    WBC 4.23 07/13/2023    HGB 12.3 07/13/2023    HCT 38.8 07/13/2023    PLT 420 (H) 07/13/2023     Lab Results   Component Value Date    GLU 88 07/13/2023    BUN 20 07/13/2023    CREAT 0.7 07/13/2023    NA 140 07/13/2023    K 4.2 07/13/2023    CL 105 07/13/2023    CO2 27 07/13/2023    AST 29 08/29/2023    ALT 20 08/29/2023     Lab Results   Component Value Date    TSH 0.75 07/13/2023    HGBA1C 6.0 (H) 07/13/2023     Lab Results   Component Value Date    CHOL 195 08/29/2023    TRIG 76 08/29/2023    HDL 52 08/29/2023    LDL 128 (H) 08/29/2023     No results found for: LPACHOL    The 10-year ASCVD risk score (Arnett DK, et al., 2019) is: 11.4%    Values used to calculate the score:      Age: 59 years      Clincally relevant sex: Female      Is Non-Hispanic African American: Yes      Diabetic: No      Tobacco smoker: No      Systolic Blood Pressure: 136 mmHg      Is BP treated: Yes      HDL Cholesterol: 52 mg/dL      Total Cholesterol: 195 mg/dL    Most recent labs reviewed.    IMPRESSION:   Wendy [name] is a 68 y.o. female with the following problems:    Hypertension  Hyperlipidemia, LDL 128 mg/dL May 7974.   Intolerant to simvastatin  and rosuvastatin  -neck itching when taken concomitantly with antihypertensive medication  No aortic aneurysm (sees abdominal/pelvis CT scan May 30, 2022)  Abnormal nuc stress test likely from breast attenuation defect with subsequent CT score of 0, Oct 2024. No anginal equivalent symptoms with high levels of exercises.    RECOMMENDATIONS:  Continue current cardiac medications.  Check blood pressure and heart rate daily.  Call if systolic blood pressure sustains less than 100s or greater than 140s, or heart rate sustains less than 50s or greater than 110s.  Continued lifestyle modifications.  Follow up in 1 year for annual, or sooner as needed.                                               Orders Placed This Encounter   Procedures    ECG 12 lead (Normal)    Office Visit (HRT [place])     Orders Placed This Encounter   Medications    olmesartan  (BENICAR ) 20 MG tablet     Sig: Take 1 tablet (20 mg) by mouth once daily     Dispense:  90 tablet     Refill:  3    Bempedoic Acid-Ezetimibe  (Nexlizet ) 180-10 MG Tablet     Sig: Take 1 tablet by mouth once daily     Dispense:  90 tablet     Refill:  3     SIGNED:    Bartley Cheril Mai, FNP    Please pardon any potential grammatical or typographical errors as aspects of this note may have been created through speech-to-text software.

## 2023-12-26 ENCOUNTER — Telehealth (INDEPENDENT_AMBULATORY_CARE_PROVIDER_SITE_OTHER): Payer: Self-pay | Admitting: Internal Medicine

## 2023-12-26 NOTE — Telephone Encounter (Signed)
 Copied from CRM [id number]. Topic: Clinical Support - Speak With Nurse  >> Dec 26, 2023 12:26 PM Wendy [name] wrote:  [name] called about Clinical Support - Speak With Nurse.  Additional details:    Pt called in stating she is having eye procedure on 09/25 and needs clearance. Pt's pcp earliest appt is until October.    Please advise     [phone]

## 2023-12-26 NOTE — Telephone Encounter (Signed)
 Patient scheduled for 01/03/2024 - Will bring pre-op paperwork

## 2024-01-03 ENCOUNTER — Encounter (INDEPENDENT_AMBULATORY_CARE_PROVIDER_SITE_OTHER): Payer: Self-pay | Admitting: Internal Medicine

## 2024-01-03 ENCOUNTER — Ambulatory Visit (INDEPENDENT_AMBULATORY_CARE_PROVIDER_SITE_OTHER): Admitting: Internal Medicine

## 2024-01-03 VITALS — BP 125/72 | HR 67 | Temp 97.8°F | Wt 168.9 lb

## 2024-01-03 DIAGNOSIS — Z79899 Other long term (current) drug therapy: Secondary | ICD-10-CM

## 2024-01-03 DIAGNOSIS — E782 Mixed hyperlipidemia: Secondary | ICD-10-CM

## 2024-01-03 DIAGNOSIS — Z01818 Encounter for other preprocedural examination: Secondary | ICD-10-CM

## 2024-01-03 DIAGNOSIS — H259 Unspecified age-related cataract: Secondary | ICD-10-CM

## 2024-01-03 DIAGNOSIS — R7303 Prediabetes: Secondary | ICD-10-CM

## 2024-01-03 DIAGNOSIS — I10 Essential (primary) hypertension: Secondary | ICD-10-CM

## 2024-01-03 DIAGNOSIS — Z947 Corneal transplant status: Secondary | ICD-10-CM

## 2024-01-03 NOTE — Progress Notes (Signed)
 Chief Complaint   Patient presents with    Pre-op Exam     EKG last done 11/2023 (Cardio)  Would like labs run - even though none are needed for surgery       .PREOPERATIVE EVALUATION AND CLEARANCE    Wendy [name] is a 68 y.o. 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    Planned anesthesia: regional and general.   The patient has no known anesthesia issues   Patient has no Hx  bleeding risk in the past    The surgeon has requested the following labs/studies to be performed; EKG        She does have a history of borderline prediabetes, history of hypertension which is well-controlled, history of hypercholesterolemia on antilipid medication which is under control, she had an EKG through cardiology on December 06, 2023 which showed normal sinus rhythm  She denies any history of chest pain, no dyspnea on exertion, no shortness of breath, no chronic cough, no palpitations, no leg edema no chest pain.      Pt has no personal history of cardiac or pulmonary disease.  Pt has no personal or family history of bleeding or VTE.  Pt is able to climb a flight of stairs without stopping or having symptoms            Medical History[1]   Past Surgical History[2]   Prescriptions Prior to Admission[3]  Current/Home Medications    ACETAZOLAMIDE  (DIAMOX ) 125 MG TABLET    Take 1 tablet (125 mg) by mouth once as needed (HA)    BEMPEDOIC ACID-EZETIMIBE  (NEXLIZET ) 180-10 MG TABLET    Take 1 tablet by mouth once daily    CO-ENZYME Q-10 50 MG CAPSULE    Take 1 capsule (50 mg) by mouth daily    FLUTICASONE  (FLONASE ) 50 MCG/ACT NASAL SPRAY    1 spray by Nasal route daily    IBUPROFEN  (ADVIL ) 800 MG TABLET    Take 1 tablet (800 mg) by mouth every 6  (six) hours as needed for Pain    MULTIPLE VITAMIN (MULTIVITAMIN) TABLET    Take 1 tablet by mouth daily    NORTRIPTYLINE  (PAMELOR ) 10 MG CAPSULE    Take 1 capsule (10 mg) by mouth daily as needed (Headache)    NYSTATIN  (MYCOSTATIN ) OINTMENT    Apply topically 2 (two) times daily    OLMESARTAN  (BENICAR ) 20 MG TABLET    Take 1 tablet (20 mg) by mouth once daily    PREDNISOLONE  ACETATE (PRED FORTE ) 1 % OPHTHALMIC SUSPENSION    Place 1 drop into the left eye 4 (four) times daily    TRIAMCINOLONE  (KENALOG ) 0.025 % OINTMENT    Apply topically 2 (two) times daily     Allergies[4]   Social History     Tobacco Use    Smoking status: Never    Smokeless tobacco: Never   Substance Use Topics    Alcohol use: Yes     Alcohol/week: 0.0 - 1.0 standard drinks of alcohol     Comment: 1 drink a month      Family History[5]     Review of Systems:       Review of Systems -  General ROS: negative for - chills, fatigue, fever or malaise  Psychological ROS: negative for - anxiety, depression,   Ophthalmic ROS: Right eye blurred vision  Respiratory ROS: no cough, shortness of breath, or wheezing  Cardiovascular ROS: no chest pain or dyspnea on exertion  negative for - edema or palpitations  Gastrointestinal ROS: no abdominal pain, change in bowel habits, or black or bloody stools  Musculoskeletal ROS: negative for - muscle pain or muscular weakness  Neurological ROS: negative for - dizziness, headaches, numbness/tingling, tremors, visual changes or weakness            BP 125/72 (BP Site: Right arm, Patient Position: Sitting, Cuff Size: Medium)   Pulse 67   Temp 97.8 F (36.6 C) (Temporal)   Wt 76.6 kg (168 lb 14.4 oz)   SpO2 100%   BMI 28.99 kg/m   Wt Readings from Last 3 Encounters:   01/03/24 76.6 kg (168 lb 14.4 oz)   12/06/23 75.3 kg (166 lb)   09/07/23 72.6 kg (160 lb)           Mental status - alert, oriented to person, place, and time  HEENT: Conjunctiva is clear.   Neck - supple, no cervical  Adenopathy,No carotid  bruit  Chest - clear to auscultation, no wheezes, rales or rhonchi, symmetric air entry  Heart - normal rate, regular rhythm, normal S1, S2, no murmurs, rubs, clicks or gallops  Abdomen - soft, nontender, nondistended, no masses or organomegaly, no hepatosplenomegaly   Neurological - alert, oriented, normal speech, no focal findings  motor and sensory grossly normal bilaterally  Extremities - dorsalis pedis  pulses normal, no pedal edema,         Labs  ECG: normal sinus rhythm, no blocks or conduction defects, no ischemic changes.       Assessment/Recommendations:  1. Senile cataract of right eye, unspecified age-related cataract type    2. Cornea transplant recipient    3. Preop exam for internal medicine    4. Essential hypertension    5. Prediabetes    6. Mixed hyperlipidemia    7. On statin therapy        There is no evidence of unstable angina, decompensated heart failure, or severe arrythmia.  .  I reviewed her labs and are normal and as above  Revised Cardiac Risk Index (high risk surgery, h/o CAD, h/o CHF, h/o CVD, DM w/ insulin, Cr >2) indicates a No  major cardiac complication  given minimally iinvasive procedure  The patient may continue all of his/her medications,  There are no medical contraindications to surgery, and I recommend proceeding as planned        Dr Kaleta Hoes MD  Internist  [facility] [place]  [address], [place],  [place] [id number]  [phone]  [phone]           [1]   Past Medical History:  Diagnosis Date    Brain tumor (benign) (CMS/HCC)     Hypercholesteremia     Hypertensive disorder    [2]   Past Surgical History:  Procedure Laterality Date    ARTHROPLASTY, KNEE, TOTAL MAKOPLASTY Left 08/03/2020    Procedure: ARTHROPLASTY, TOTAL, KNEE MAKOPLASTY;  Surgeon: Nivia Sandralee NOVAK, MD;  Location: [facility];  Service: Orthopedics;  Laterality: Left;    BRAIN SURGERY  2009    BREAST BIOPSY Right 2008    right breast biopsy- benign     BREAST BIOPSY Right 1970s  right breast tumor removed 40+ years ago - benign    BREAST CYST EXCISION  1970s    right breast tumor removed 40+ years ago - benign    CESAREAN SECTION      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, CORNEAL IMPLANT Left     EYE SURGERY      KNEE SURGERY Left 08/03/2020    left foot surgery       1.  Left second hammertoe repair with proximal interphalangeal joint  arthroplasty. 2.  Partial excision base of phalanx and bone spur, left great toe phalanx    TUBAL LIGATION     [3] (Not in a hospital admission)  [4]   Allergies  Allergen Reactions    Iodine Shortness Of Breath    Penicillins Shortness Of Breath     Has patient had a PCN reaction causing immediate rash, facial/tongue/throat swelling, SOB or lightheadedness with hypotension: Yes  Has patient had a PCN reaction causing severe rash involving mucus membranes or skin necrosis: No  Has patient had a PCN reaction that required hospitalization: No  Has patient had a PCN reaction occurring within the last 10 years: Yes  If all of the above answers are NO, then may proceed with Cephalosporin use.    Shellfish-Derived Products Anaphylaxis    Atorvastatin       Joint pain     Morphine      Itchy     Tetanus Immune Globulin      Arm swelling     Zocor  [Simvastatin ]      No response    [5]   Family History  Problem Relation Name Age of Onset    Hyperlipidemia Mother Wendy [name]     Diabetes Mother Wendy [name]     Hypertension Mother Wendy [name]     Stroke Mother Wendy [name]     Diabetes Wendy [name] Wendy [name]     Hypertension Wendy [name] Wendy [name]     Diabetes Brother Wendy [name]     Breast cancer Neg Hx

## 2024-01-03 NOTE — Progress Notes (Signed)
 Have you seen any specialists since your last visit with us ?  Yes  - Eye and Cardio      The patient was informed that the following HM items are still outstanding:   Health Maintenance Due   Topic Date Due    Statin Use  Never done    Medicare Annual Wellness Visit  07/07/2022    Pap Smear  07/07/2023

## 2024-02-19 ENCOUNTER — Other Ambulatory Visit

## 2024-03-04 ENCOUNTER — Other Ambulatory Visit

## 2024-03-04 DIAGNOSIS — E782 Mixed hyperlipidemia: Secondary | ICD-10-CM

## 2024-03-04 DIAGNOSIS — I10 Essential (primary) hypertension: Secondary | ICD-10-CM

## 2024-03-04 LAB — HEPATIC FUNCTION PANEL (LFT)
ALT: 23 U/L (ref <=55)
AST (SGOT): 28 U/L (ref <=41)
Albumin/Globulin Ratio: 1.4 (ref 0.9–2.2)
Albumin: 4.2 g/dL (ref 3.5–4.9)
Alkaline Phosphatase: 60 U/L (ref 37–117)
Bilirubin Direct: 0.1 mg/dL (ref 0.0–0.5)
Bilirubin Indirect: 0.3 mg/dL (ref 0.2–1.0)
Bilirubin, Total: 0.4 mg/dL (ref 0.2–1.2)
Globulin: 3 g/dL (ref 2.0–3.6)
Hemolysis Index: 6 {index}
Protein, Total: 7.2 g/dL (ref 6.0–8.3)

## 2024-03-04 LAB — BASIC METABOLIC PANEL
Anion Gap: 7 (ref 5.0–15.0)
BUN: 20 mg/dL (ref 7–21)
CO2: 28 meq/L (ref 17–29)
Calcium: 9.6 mg/dL (ref 8.5–10.5)
Chloride: 106 meq/L (ref 99–111)
Creatinine: 0.6 mg/dL (ref 0.4–1.0)
GFR: >60 mL/min/1.73 m2 (ref >=60.0)
Glucose: 87 mg/dL (ref 70–100)
Hemolysis Index: 6 {index}
Potassium: 4.2 meq/L (ref 3.5–5.3)
Sodium: 141 meq/L (ref 135–145)

## 2024-03-04 LAB — LIPID PANEL
Cholesterol / HDL Ratio: 4.3 {index}
Cholesterol: 215 mg/dL — ABNORMAL HIGH (ref <=199)
HDL: 50 mg/dL (ref >=40)
LDL Calculated: 151 mg/dL — ABNORMAL HIGH (ref 0–99)
Triglycerides: 80 mg/dL (ref 34–149)
VLDL Calculated: 15 mg/dL (ref 10–40)

## 2024-03-05 ENCOUNTER — Encounter (INDEPENDENT_AMBULATORY_CARE_PROVIDER_SITE_OTHER): Payer: Self-pay | Admitting: Family Nurse Practitioner

## 2024-03-05 ENCOUNTER — Ambulatory Visit (INDEPENDENT_AMBULATORY_CARE_PROVIDER_SITE_OTHER): Admitting: Family Nurse Practitioner

## 2024-03-05 VITALS — BP 165/95 | HR 70 | Temp 98.4°F | Resp 18 | Ht 64.0 in | Wt 167.0 lb

## 2024-03-05 DIAGNOSIS — N952 Postmenopausal atrophic vaginitis: Secondary | ICD-10-CM

## 2024-03-05 DIAGNOSIS — I10 Essential (primary) hypertension: Secondary | ICD-10-CM

## 2024-03-05 DIAGNOSIS — N760 Acute vaginitis: Secondary | ICD-10-CM

## 2024-03-05 MED ORDER — REPLENS EXTERNAL COMFORT VA GEL: 1.0000 | Freq: Every day | VAGINAL | Status: DC

## 2024-03-05 NOTE — Progress Notes (Signed)
 [facility]  OFFICE NOTE         Subjective   Historian: Patient      Chief Complaint   Patient presents with    Vaginal Itching     Pt is here c/o vaginal itchiness and dryness x 4 days.         History of Present Illness  Wendy [name] is a 68 year old female who presents with vaginal discomfort and external bleeding.    She experiences vaginal discomfort characterized by external bleeding when wiping, itching, dryness, and a yellowish discharge. These symptoms have made her feel very uncomfortable and have prompted her visit today.    No recent antibiotic use. No burning sensation during urination, although she notes some urgency. She drinks a lot of water and has had urinary tract infections in the past, but states that this does not feel like a urinary tract infection.    She has not tried any treatments for dryness as the symptoms just started.    History:  Medications and Allergies reviewed.   Pertinent Past Medical, Surgical, Family and Social History were reviewed.        Objective     Vitals:    03/05/24 1852   BP: (!) 165/95   BP Site: Left arm   Patient Position: Sitting   Cuff Size: Medium   Pulse: 70   Resp: 18   Temp: 98.4 F (36.9 C)   TempSrc: Oral   SpO2: 99%   Weight: 75.8 kg (167 lb)   Height: 1.626 m (5' 4)      Body mass index is 28.67 kg/m.          Physical Exam  HENT:      Head: Normocephalic.   Cardiovascular:      Rate and Rhythm: Normal rate.   Pulmonary:      Effort: Pulmonary effort is normal.   Genitourinary:     Exam position: Lithotomy position.      Labia:         Right: No rash, tenderness, lesion or injury.         Left: No rash, tenderness, lesion or injury.       Comments: dryness  Musculoskeletal:         General: Normal range of motion.      Cervical back: Normal range of motion and neck supple.   Neurological:      General: No focal deficit present.      Mental Status: She is alert and oriented to person, place, and time. Mental status is at baseline.    Skin:     General: Skin is warm and dry.   Psychiatric:         Mood and Affect: Mood normal.         Behavior: Behavior normal.         Thought Content: Thought content normal.         Judgment: Judgment normal.   Vitals reviewed. Exam conducted with a chaperone present (Husna).      Physical Exam  GENITOURINARY: External vagina dry, no fissures.    Urgent Care Course   There were no labs reviewed with this patient during the visit.    There were no x-rays reviewed with this patient during the visit.      Procedures   Procedures     Assessment / Plan     Differential Diagnoses including but not limited to: BV, Candida,  Bartholin Cyst, UTI, STI, Atrophic vulvovaginitis    Assessment & Plan  Postmenopausal atrophic vaginitis with external irritation  External irritation and dryness attributed to postmenopausal atrophic vaginitis. No evidence of yeast infection or bacterial vaginosis. UTI unlikely.  - Recommended over-the-counter vaginal moisturizers for 1-2 weeks, follow up with PCP if no improvement  - Advised purchasing vaginal moisturizers from drugstores like [employer], [employer], or [facility].    Acute vaginitis, unspecified etiology (pending workup)  Acute vaginitis with differential diagnosis including bacterial vaginosis and candidiasis. Awaiting swab results for confirmation.  - Ordered BV and Candida swabs.  - Await swab results before initiating specific treatment.     Elevated BP in center with hx of HTN  -Could be due to feeling unwell.  -Pt denies any chest pain, sob, dizziness, HA.  -Advised to monitor at home and fu with PCP if persistently >140/90 at home.  -Strict ER for any worsening symptoms.    Anslee was seen today for vaginal itching.    Diagnoses and all orders for this visit:    Vaginitis, atrophic, postmenopausal  -     Vaginal Moisturizer (Replens External Comfort) Gel; Place 1 Application vaginally once daily    Vulvovaginitis  -     Vaginal Bacterial vaginosis and Candida spp., PCR; Future  -      Vaginal Bacterial vaginosis and Candida spp., PCR         The indications for early follow-up with PCP and return to UC were discussed. Patient/family received education on the working diagnosis, diagnostic uncertainties, and proposed treatment plan. Indications for emergency evaluation in the ED were reviewed. Written and verbal discharge instructions were provided and discussed and all questions from the patient/family were addressed, with no apparent barriers.      Verbal consent obtained to record this visit.

## 2024-03-06 ENCOUNTER — Ambulatory Visit (INDEPENDENT_AMBULATORY_CARE_PROVIDER_SITE_OTHER): Payer: Self-pay | Admitting: Family Nurse Practitioner

## 2024-03-06 ENCOUNTER — Ambulatory Visit (INDEPENDENT_AMBULATORY_CARE_PROVIDER_SITE_OTHER): Payer: Self-pay

## 2024-03-06 DIAGNOSIS — B3731 Acute candidiasis of vulva and vagina: Secondary | ICD-10-CM

## 2024-03-06 DIAGNOSIS — B9689 Other specified bacterial agents as the cause of diseases classified elsewhere: Secondary | ICD-10-CM

## 2024-03-06 LAB — VAGINAL BACTERIAL VAGINOSIS AND CANDIDA SPP., PCR
Bacterial vaginosis marker DNA: DETECTED — AB
Candida glabrata DNA: NOT DETECTED
Candida group DNA: DETECTED — AB
Candida krusei DNA: NOT DETECTED

## 2024-03-06 MED ORDER — METRONIDAZOLE 0.75 % EX GEL
Freq: Every day | CUTANEOUS | 0 refills | Status: AC
Start: 2024-03-06 — End: 2024-03-11

## 2024-03-06 MED ORDER — FLUCONAZOLE 150 MG PO TABS
150.0000 mg | ORAL_TABLET | ORAL | 0 refills | Status: AC | PRN
Start: 2024-03-06 — End: 2024-03-12

## 2024-03-06 NOTE — Telephone Encounter (Signed)
 Discussed results and treatment option via phone call with Sari Croft after verifying name and DOB. Rx sent to [facility] in [place]

## 2024-03-11 ENCOUNTER — Inpatient Hospital Stay (INDEPENDENT_AMBULATORY_CARE_PROVIDER_SITE_OTHER): Admitting: Internal Medicine

## 2024-04-07 ENCOUNTER — Telehealth (INDEPENDENT_AMBULATORY_CARE_PROVIDER_SITE_OTHER): Payer: Self-pay

## 2024-04-07 MED ORDER — NEXLIZET 180-10 MG PO TABS: 1.0000 | ORAL_TABLET | Freq: Every day | ORAL | 2 refills | Status: AC

## 2024-04-07 NOTE — Telephone Encounter (Signed)
 Prior Authorization request for Nexlizet  180-10 mg:    Xzb:[id number]   CaseId:n/a  Status:authorization already on file for this request... Authorization ending on 04/23/25.    Update: sent to pt's pharmacy

## 2024-05-14 ENCOUNTER — Telehealth (INDEPENDENT_AMBULATORY_CARE_PROVIDER_SITE_OTHER): Payer: Self-pay

## 2024-05-14 NOTE — Telephone Encounter (Signed)
 Pt called as her BP is  not working  for her any longer .  Her BP have been average 140  and higher .      She is on olmesartan   20 mg qd .  Explained we can certainly  increase the dosing  . She declined as the medication  smells   and wants a new med  .    Placed her in f/u for tomorrow @ [facility]  . She will bring her bp cuff and  log to [facility]  .

## 2024-05-15 ENCOUNTER — Ambulatory Visit (INDEPENDENT_AMBULATORY_CARE_PROVIDER_SITE_OTHER): Admitting: Family Nurse Practitioner

## 2024-05-15 ENCOUNTER — Encounter (INDEPENDENT_AMBULATORY_CARE_PROVIDER_SITE_OTHER): Payer: Self-pay | Admitting: Family Nurse Practitioner

## 2024-05-15 VITALS — BP 150/88 | HR 87 | Ht 64.0 in | Wt 164.0 lb

## 2024-05-15 DIAGNOSIS — E782 Mixed hyperlipidemia: Secondary | ICD-10-CM

## 2024-05-15 DIAGNOSIS — I10 Essential (primary) hypertension: Secondary | ICD-10-CM

## 2024-05-15 MED ORDER — LOSARTAN POTASSIUM 100 MG PO TABS: 100.0000 mg | ORAL_TABLET | Freq: Every day | ORAL | 1 refills | Status: AC

## 2024-05-15 NOTE — Progress Notes (Signed)
 [facility] OFFICE PROGRESS NOTE    [facility] [place]  [id number]  [facility] OFF[facility] - CARDIOLOGY  [address]  [place] [address]  Dept: [phone]  Dept Fax: [phone]     Patient Name: Wendy [name]    Date of Visit:  May 15, 2024  Date of Birth: 1955/12/31  AGE: 69 y.o.  Medical Record #: [id number]  Requesting Physician: Kaleta Hoes, MD    CHIEF COMPLAINT: Hypertension    HISTORY OF PRESENT ILLNESS:    She is a pleasant 69 y.o. female who presents today for visit for management of hypertension.  Patient has noticed elevated BP readings of SBP 140s to 150s though she reports missing some doses of olmesartan  due to its  terrible smell.     Back when I saw her in March 2025, we agreed to increase losartan  to 50 mg once daily for better BP control.  Given uncontrolled pressures still, I instructed to add hydrochlorothiazide  in addition to her existing regimen via triage call (documented 07/18/2023).  However, appears there has been some miscommunication and she confirmed during today's visit that she has been only taking hydrochlorothiazide  hide from March into April 2025 when she saw NP Sierra in the office.  At that appointment in April, due to ongoing elevated readings on monotherapy hydrochlorothiazide , NP Sierra recommended switching this out altogether to olmesartan .  She denies any chest pain, dyspnea, or any other cardiac symptoms.    PAST MEDICAL HISTORY: She has a past medical history of Brain tumor (benign) (CMS/HCC), Hypercholesteremia, and Hypertensive disorder. She has a past surgical history that includes Brain surgery (2009); Corneal transplant (2013); Breast biopsy (Right, 2008); Breast biopsy (Right, 1970s); Breast cyst excision (1970s); Eye surgery; Cesarean section; Tubal ligation; EXCHANGE, CORNEAL IMPLANT (Left); ARTHROPLASTY, KNEE, TOTAL MAKOPLASTY (Left, 08/03/2020); Knee surgery (Left, 08/03/2020); and left foot  surgery .    Allergies  Reviewed by Alberto Berger, MA on 03/05/2024        Severity Reactions Comments    Iodine High Shortness Of Breath     Penicillins High Shortness Of Breath Has patient had a PCN reaction causing immediate rash, facial/tongue/throat swelling, SOB or lightheadedness with hypotension: Yes Has patient had a PCN reaction causing severe rash involving mucus membranes or skin necrosis: No Has patient had a PCN reaction that required hospitalization: No Has patient had a PCN reaction occurring within the last 10 years: Yes If all of the above answers are NO, then may proceed with Cephalosporin use.    Shellfish Protein-containing Drug Products High Anaphylaxis     Atorvastatin  Not Specified  Joint pain     Morphine Not Specified  Itchy     Tetanus Immune Globulin Not Specified  Arm swelling     Zocor  [simvastatin ] Not Specified  No response              MEDICATIONS:   Patient's current medications were reviewed. ONLY Cardiac medications were updated unless others were addressed in assessment and plan.    Current Outpatient Medications (Relevant to Cardiology)   Medication Sig    acetaZOLAMIDE  Take 1 tablet (125 mg) by mouth once as needed (HA)    Nexlizet  Take 1 tablet by mouth once daily    losartan  Take 1 tablet (100 mg) by mouth once daily     Current Outpatient Medications (Other)   Medication Sig    multivitamin Take 1 tablet by mouth daily    co-enzyme Q-10 Take  1 capsule (50 mg) by mouth daily    prednisoLONE  acetate Place 1 drop into the left eye 4 (four) times daily    nortriptyline  Take 1 capsule (10 mg) by mouth daily as needed (Headache)    fluticasone  1 spray by Nasal route daily    nystatin  Apply topically 2 (two) times daily    triamcinolone  Apply topically 2 (two) times daily       FAMILY HISTORY: family history includes Diabetes in her brother, father, and mother; Hyperlipidemia in her mother; Hypertension in her father and mother; Stroke in her mother.    SOCIAL HISTORY: She  reports that she has never smoked. She has never used smokeless tobacco. She reports current alcohol use of about 2.0 standard drinks of alcohol per week. She reports that she does not use drugs.    PHYSICAL EXAMINATION    Visit Vitals  BP 150/88 (BP Site: Left arm, Patient Position: Sitting)   Pulse 87   Ht 1.626 m (5' 4)   Wt 74.4 kg (164 lb)   BMI 28.15 kg/m     Constitutional: Cooperative, alert, no acute distress.  Neck: No carotid bruits, JVP normal.  Cardiac: Regular rate and rhythm, normal S1 and S2; no S3 or S4. No murmurs. No rubs, no gallops.  Pulmonary: Clear to auscultation bilaterally, no wheezing, no rhonchi, no rales.  Extremities: no edema.  Vascular: +2 pulses in radial artery bilaterally, 2+ pedal pulses bilaterally.    ECG: N/A    LABS REVIEWED:   Lab Results   Component Value Date    WBC 4.23 07/13/2023    HGB 12.3 07/13/2023    HCT 38.8 07/13/2023    PLT 420 (H) 07/13/2023     Lab Results   Component Value Date    GLU 87 03/04/2024    BUN 20 03/04/2024    CREAT 0.6 03/04/2024    NA 141 03/04/2024    K 4.2 03/04/2024    CL 106 03/04/2024    CO2 28 03/04/2024    AST 28 03/04/2024    ALT 23 03/04/2024     Lab Results   Component Value Date    TSH 0.75 07/13/2023    HGBA1C 6.0 (H) 07/13/2023     Lab Results   Component Value Date    CHOL 215 (H) 03/04/2024    TRIG 80 03/04/2024    HDL 50 03/04/2024    LDL 151 (H) 03/04/2024     No results found for: LPACHOL    The 10-year ASCVD risk score (Arnett DK, et al., 2019) is: 15.8%    Values used to calculate the score:      Age: 40 years      Clinically relevant sex: Female      Is Non-Hispanic African American: Yes      Diabetic: No      Tobacco smoker: No      Systolic Blood Pressure: 150 mmHg      Is BP treated: Yes      HDL Cholesterol: 50 mg/dL      Total Cholesterol: 215 mg/dL    Most recent BP log reviewed.    IMPRESSION:   Wendy [name] is a 69 y.o. female with the following problems:    Hypertension  Hyperlipidemia, LDL 128 mg/dL May 7974.   No  aortic aneurysm (sees abdominal/pelvis CT scan May 30, 2022)  Abnormal nuc stress test likely from breast attenuation defect with subsequent CT score of 0, Oct 2024. No anginal  equivalent symptoms with high levels of exercises.  Medication tolerance  Patient prefers not to take olmesartan  due to its  terrible smell -however, confirmed no adverse reaction at OV on 04/2024  Intolerant to simvastatin  and rosuvastatin  -neck itching when taken concomitantly with antihypertensive medication    RECOMMENDATIONS:    Discontinue olmesartan .  Restart losartan  at higher dose.  Previously on losartan  50 mg daily.  Increase this to 100 mg once daily.  Check blood pressure and heart rate daily.  Call sooner for systolic blood pressure sustaining greater than 160s.    Continue all other cardiac medications.  Follow-up with APP in 3 months.  Consider adding on hydrochlorothiazide  in addition to existing ARB if still having hypertensive readings.                                               Orders Placed This Encounter   Procedures    APP Office Visit ([facility])       Orders Placed This Encounter   Medications    losartan  (COZAAR ) 100 MG tablet     Sig: Take 1 tablet (100 mg) by mouth once daily     Dispense:  90 tablet     Refill:  1     SIGNED:    Bartley Cheril Mai, FNP    Please pardon any potential grammatical or typographical errors as aspects of this note may have been created through speech-to-text software.

## 2024-07-15 ENCOUNTER — Ambulatory Visit (INDEPENDENT_AMBULATORY_CARE_PROVIDER_SITE_OTHER): Admitting: Internal Medicine

## 2024-08-14 ENCOUNTER — Encounter (INDEPENDENT_AMBULATORY_CARE_PROVIDER_SITE_OTHER): Admitting: Family Nurse Practitioner
